# Patient Record
Sex: Female | Born: 1950 | Race: White | Hispanic: No | Marital: Single | Smoking: Former smoker
Health system: Southern US, Community
[De-identification: ages and names within clinical notes are randomized; demographics above are authoritative.]

## PROBLEM LIST (undated history)

## (undated) ENCOUNTER — Emergency Department (HOSPITAL_COMMUNITY): Admission: EM | Payer: Medicare Other

## (undated) DIAGNOSIS — Z923 Personal history of irradiation: Secondary | ICD-10-CM

## (undated) DIAGNOSIS — F419 Anxiety disorder, unspecified: Secondary | ICD-10-CM

## (undated) DIAGNOSIS — Z9221 Personal history of antineoplastic chemotherapy: Secondary | ICD-10-CM

## (undated) DIAGNOSIS — K219 Gastro-esophageal reflux disease without esophagitis: Secondary | ICD-10-CM

## (undated) DIAGNOSIS — M75100 Unspecified rotator cuff tear or rupture of unspecified shoulder, not specified as traumatic: Secondary | ICD-10-CM

## (undated) DIAGNOSIS — C50919 Malignant neoplasm of unspecified site of unspecified female breast: Secondary | ICD-10-CM

## (undated) DIAGNOSIS — H409 Unspecified glaucoma: Secondary | ICD-10-CM

## (undated) DIAGNOSIS — L309 Dermatitis, unspecified: Secondary | ICD-10-CM

## (undated) DIAGNOSIS — F329 Major depressive disorder, single episode, unspecified: Secondary | ICD-10-CM

## (undated) DIAGNOSIS — J309 Allergic rhinitis, unspecified: Secondary | ICD-10-CM

## (undated) DIAGNOSIS — N390 Urinary tract infection, site not specified: Secondary | ICD-10-CM

## (undated) DIAGNOSIS — D649 Anemia, unspecified: Secondary | ICD-10-CM

## (undated) DIAGNOSIS — G620 Drug-induced polyneuropathy: Secondary | ICD-10-CM

## (undated) DIAGNOSIS — F32A Depression, unspecified: Secondary | ICD-10-CM

## (undated) DIAGNOSIS — M1990 Unspecified osteoarthritis, unspecified site: Secondary | ICD-10-CM

## (undated) DIAGNOSIS — T451X5A Adverse effect of antineoplastic and immunosuppressive drugs, initial encounter: Secondary | ICD-10-CM

## (undated) DIAGNOSIS — R0600 Dyspnea, unspecified: Secondary | ICD-10-CM

## (undated) DIAGNOSIS — E039 Hypothyroidism, unspecified: Secondary | ICD-10-CM

## (undated) HISTORY — DX: Major depressive disorder, single episode, unspecified: F32.9

## (undated) HISTORY — PX: OTHER SURGICAL HISTORY: SHX169

## (undated) HISTORY — PX: APPENDECTOMY: SHX54

## (undated) HISTORY — PX: ADENOIDECTOMY: SUR15

## (undated) HISTORY — PX: POLYPECTOMY: SHX149

## (undated) HISTORY — DX: Urinary tract infection, site not specified: N39.0

## (undated) HISTORY — PX: BREAST LUMPECTOMY: SHX2

## (undated) HISTORY — DX: Gastro-esophageal reflux disease without esophagitis: K21.9

## (undated) HISTORY — PX: UPPER GASTROINTESTINAL ENDOSCOPY: SHX188

## (undated) HISTORY — PX: COLONOSCOPY: SHX174

## (undated) HISTORY — PX: TONSILLECTOMY: SUR1361

## (undated) HISTORY — DX: Allergic rhinitis, unspecified: J30.9

## (undated) HISTORY — DX: Unspecified glaucoma: H40.9

## (undated) HISTORY — PX: ROTATOR CUFF REPAIR: SHX139

## (undated) HISTORY — DX: Anxiety disorder, unspecified: F41.9

## (undated) HISTORY — PX: CHOLECYSTECTOMY: SHX55

## (undated) HISTORY — PX: WISDOM TOOTH EXTRACTION: SHX21

---

## 2011-05-21 DIAGNOSIS — J302 Other seasonal allergic rhinitis: Secondary | ICD-10-CM | POA: Insufficient documentation

## 2011-05-21 DIAGNOSIS — F419 Anxiety disorder, unspecified: Secondary | ICD-10-CM | POA: Insufficient documentation

## 2011-05-21 DIAGNOSIS — F411 Generalized anxiety disorder: Secondary | ICD-10-CM | POA: Insufficient documentation

## 2013-02-09 ENCOUNTER — Emergency Department (INDEPENDENT_AMBULATORY_CARE_PROVIDER_SITE_OTHER)
Admission: EM | Admit: 2013-02-09 | Discharge: 2013-02-09 | Disposition: A | Discharge status: Home / Self Care | Payer: BC Managed Care – PPO | Source: Home / Self Care

## 2013-02-09 ENCOUNTER — Encounter (HOSPITAL_COMMUNITY): Payer: Self-pay

## 2013-02-09 DIAGNOSIS — R310 Gross hematuria: Secondary | ICD-10-CM

## 2013-02-09 DIAGNOSIS — N390 Urinary tract infection, site not specified: Secondary | ICD-10-CM

## 2013-02-09 HISTORY — DX: Major depressive disorder, single episode, unspecified: F32.9

## 2013-02-09 HISTORY — DX: Depression, unspecified: F32.A

## 2013-02-09 LAB — POCT URINALYSIS DIP (DEVICE)
Protein, ur: >=300 mg/dL — AB
Urobilinogen, UA: 0.2 mg/dL (ref 0.0–1.0)

## 2013-02-09 MED ORDER — CIPROFLOXACIN HCL 500 MG PO TABS: 500.0000 mg | ORAL_TABLET | Freq: Two times a day (BID) | ORAL | Status: DC

## 2013-02-09 NOTE — ED Provider Notes (Addendum)
History     CSN: [id number]  Arrival date & time 02/09/13  1032   None     Chief Complaint  Patient presents with  . Urinary Tract Infection    (Consider location/radiation/quality/duration/timing/severity/associated sxs/prior treatment) HPI Comments: 62 year old female presents with fatigue and malaise for 2 days. This morning she awoke to urinate and had gross hematuria. For the past one to 2 days she has also experienced dysuria describes as a sharp discomfort upon urination. She has a history of previous "bladder problems" and received a bladder mesh. Since that time she has urinary urgency on a chronic basis. Denies flank pain or fever.   Past Medical History  Diagnosis Date  . Depressed     Past Surgical History  Procedure Laterality Date  . Cholecystectomy      History reviewed. No pertinent family history.  History  Substance Use Topics  . Smoking status: Never Smoker   . Smokeless tobacco: Not on file  . Alcohol Use: Yes    OB History   Grav Para Term Preterm Abortions TAB SAB Ect Mult Living                  Review of Systems  Constitutional: Positive for activity change and fatigue. Negative for fever.  HENT: Negative.   Respiratory: Negative.   Gastrointestinal: Negative.   Genitourinary: Negative for vaginal bleeding, vaginal discharge, vaginal pain and pelvic pain.       As per history of present illness  Hematological: Negative.   Psychiatric/Behavioral: Negative.     Allergies  Penicillins  Home Medications   Current Outpatient Rx  Name  Route  Sig  Dispense  Refill  . clonazePAM (KLONOPIN) 0.5 MG tablet   Oral   Take 0.5 mg by mouth 2 (two) times daily as needed for anxiety.         Marland Kitchen FLUoxetine (PROZAC) 10 MG capsule   Oral   Take 10 mg by mouth daily.         . nortriptyline (PAMELOR) 50 MG capsule   Oral   Take 50 mg by mouth at bedtime.         . ciprofloxacin (CIPRO) 500 MG tablet   Oral   Take 1 tablet (500 mg  total) by mouth every 12 (twelve) hours.   14 tablet   0     BP 119/70  Pulse 93  Temp(Src) 98.3 F (36.8 C) (Oral)  Resp 18  SpO2 98%  Physical Exam  Nursing note and vitals reviewed. Constitutional: She is oriented to person, place, and time. She appears well-developed and well-nourished. No distress.  Neck: Neck supple.  Cardiovascular: Normal rate.   Pulmonary/Chest: Effort normal. No respiratory distress.  Abdominal: Soft. There is no tenderness.  Musculoskeletal: Normal range of motion.  Neurological: She is alert and oriented to person, place, and time.  Skin: Skin is warm and dry.  Psychiatric: She has a normal mood and affect.    ED Course  Procedures (including critical care time)  Labs Reviewed  POCT URINALYSIS DIP (DEVICE) - Abnormal; Notable for the following:    Hgb urine dipstick LARGE (*)    Protein, ur >=300 (*)    Leukocytes, UA SMALL (*)    All other components within normal limits  URINE CULTURE   No results found. Results for orders placed during the hospital encounter of 02/09/13  POCT URINALYSIS DIP (DEVICE)      Result Value Range   Glucose, UA NEGATIVE  NEGATIVE mg/dL   Bilirubin Urine NEGATIVE  NEGATIVE   Ketones, ur NEGATIVE  NEGATIVE mg/dL   Specific Gravity, Urine 1.015  1.005 - 1.030   Hgb urine dipstick LARGE (*) NEGATIVE   pH 7.0  5.0 - 8.0   Protein, ur >=300 (*) NEGATIVE mg/dL   Urobilinogen, UA 0.2  0.0 - 1.0 mg/dL   Nitrite NEGATIVE  NEGATIVE   Leukocytes, UA SMALL (*) NEGATIVE     1. UTI (lower urinary tract infection)   2. Gross hematuria       MDM  Cipro Milligrams twice a day for 7 day AZO 3 times a day when necessary symptoms Drink plenty of fluids stay well hydrated If not improving in 2-3 days may return, otherwise followup with your primary care doctor in 3 weeks to have a repeat urinalysis to make sure the urine is free of blood.        Hayden Rasmussen, NP 02/09/13 1202  Hayden Rasmussen, NP 02/12/13 1441

## 2013-02-09 NOTE — ED Notes (Signed)
Reports blood in UA since last PM

## 2013-02-10 LAB — URINE CULTURE

## 2013-02-10 NOTE — ED Provider Notes (Signed)
Medical screening examination/treatment/procedure(s) were performed by resident physician or non-physician practitioner and as supervising physician I was immediately available for consultation/collaboration.   Berline Semrad DOUGLAS MD.   Deshone Lyssy D Melvern Ramone, MD 02/10/13 1104 

## 2013-02-13 NOTE — ED Provider Notes (Signed)
Medical screening examination/treatment/procedure(s) were performed by resident physician or non-physician practitioner and as supervising physician I was immediately available for consultation/collaboration.   KINDL,JAMES DOUGLAS MD.   James D Kindl, MD 02/13/13 1353 

## 2014-12-19 DIAGNOSIS — M79671 Pain in right foot: Secondary | ICD-10-CM

## 2014-12-19 HISTORY — DX: Pain in right foot: M79.671

## 2015-03-21 ENCOUNTER — Other Ambulatory Visit: Payer: Self-pay

## 2015-03-21 DIAGNOSIS — Z9289 Personal history of other medical treatment: Secondary | ICD-10-CM

## 2015-04-09 ENCOUNTER — Ambulatory Visit
Admission: RE | Admit: 2015-04-09 | Discharge: 2015-04-09 | Disposition: A | Discharge status: Home / Self Care | Payer: Medicare Other | Source: Ambulatory Visit

## 2015-04-09 DIAGNOSIS — Z9289 Personal history of other medical treatment: Secondary | ICD-10-CM

## 2015-04-14 ENCOUNTER — Other Ambulatory Visit: Payer: Self-pay | Admitting: Family Medicine

## 2015-04-14 DIAGNOSIS — R928 Other abnormal and inconclusive findings on diagnostic imaging of breast: Secondary | ICD-10-CM

## 2015-04-17 ENCOUNTER — Ambulatory Visit
Admission: RE | Admit: 2015-04-17 | Discharge: 2015-04-17 | Disposition: A | Discharge status: Home / Self Care | Payer: Medicare Other | Source: Ambulatory Visit | Attending: Family Medicine | Admitting: Family Medicine

## 2015-04-17 DIAGNOSIS — R928 Other abnormal and inconclusive findings on diagnostic imaging of breast: Secondary | ICD-10-CM

## 2015-09-09 ENCOUNTER — Other Ambulatory Visit: Payer: Self-pay | Admitting: Family Medicine

## 2015-09-09 DIAGNOSIS — N6320 Unspecified lump in the left breast, unspecified quadrant: Secondary | ICD-10-CM

## 2015-10-20 ENCOUNTER — Ambulatory Visit
Admission: RE | Admit: 2015-10-20 | Discharge: 2015-10-20 | Disposition: A | Discharge status: Home / Self Care | Payer: Medicare Other | Source: Ambulatory Visit | Attending: Family Medicine | Admitting: Family Medicine

## 2015-10-20 DIAGNOSIS — N6320 Unspecified lump in the left breast, unspecified quadrant: Secondary | ICD-10-CM

## 2016-03-10 ENCOUNTER — Telehealth: Payer: Self-pay | Admitting: Internal Medicine

## 2016-03-10 NOTE — Telephone Encounter (Signed)
Dr. Hilarie Fredrickson has accepted patient.

## 2016-03-22 ENCOUNTER — Encounter: Payer: Self-pay | Admitting: Internal Medicine

## 2016-03-23 ENCOUNTER — Encounter: Payer: Self-pay | Admitting: Internal Medicine

## 2016-03-24 ENCOUNTER — Other Ambulatory Visit: Payer: Self-pay | Admitting: Family Medicine

## 2016-03-24 DIAGNOSIS — N63 Unspecified lump in unspecified breast: Secondary | ICD-10-CM

## 2016-04-22 ENCOUNTER — Ambulatory Visit
Admission: RE | Admit: 2016-04-22 | Discharge: 2016-04-22 | Disposition: A | Discharge status: Home / Self Care | Payer: Medicare Other | Source: Ambulatory Visit | Attending: Family Medicine | Admitting: Family Medicine

## 2016-04-22 DIAGNOSIS — N63 Unspecified lump in unspecified breast: Secondary | ICD-10-CM

## 2016-05-05 ENCOUNTER — Ambulatory Visit (AMBULATORY_SURGERY_CENTER): Payer: Self-pay

## 2016-05-05 VITALS — Ht 65.0 in | Wt 149.0 lb

## 2016-05-05 DIAGNOSIS — Z1211 Encounter for screening for malignant neoplasm of colon: Secondary | ICD-10-CM

## 2016-05-05 MED ORDER — SUPREP BOWEL PREP KIT 17.5-3.13-1.6 GM/177ML PO SOLN: 1.0000 | Freq: Once | ORAL | 0 refills | Status: AC

## 2016-05-05 NOTE — Progress Notes (Signed)
No allergies to eggs or soy No past problems with anesthesia No home oxygen No diet meds  Has email and internet; registered for emmi 

## 2016-05-18 ENCOUNTER — Ambulatory Visit (AMBULATORY_SURGERY_CENTER): Payer: Medicare Other | Admitting: Internal Medicine

## 2016-05-18 ENCOUNTER — Encounter: Payer: Self-pay | Admitting: Internal Medicine

## 2016-05-18 VITALS — BP 140/83 | HR 81 | Temp 98.4°F | Resp 17 | Ht 65.0 in | Wt 149.0 lb

## 2016-05-18 DIAGNOSIS — Z1211 Encounter for screening for malignant neoplasm of colon: Secondary | ICD-10-CM | POA: Diagnosis not present

## 2016-05-18 DIAGNOSIS — D120 Benign neoplasm of cecum: Secondary | ICD-10-CM

## 2016-05-18 DIAGNOSIS — D122 Benign neoplasm of ascending colon: Secondary | ICD-10-CM | POA: Diagnosis not present

## 2016-05-18 DIAGNOSIS — D125 Benign neoplasm of sigmoid colon: Secondary | ICD-10-CM

## 2016-05-18 MED ORDER — SODIUM CHLORIDE 0.9 % IV SOLN: 500.0000 mL | INTRAVENOUS | Status: DC

## 2016-05-18 NOTE — Patient Instructions (Signed)
YOU HAD AN ENDOSCOPIC PROCEDURE TODAY AT [facility]:   Refer to the procedure report that was given to you for any specific questions about what was found during the examination.  If the procedure report does not answer your questions, please call your gastroenterologist to clarify.  If you requested that your care partner not be given the details of your procedure findings, then the procedure report has been included in a sealed envelope for you to review at your convenience later.  YOU SHOULD EXPECT: Some feelings of bloating in the abdomen. Passage of more gas than usual.  Walking can help get rid of the air that was put into your GI tract during the procedure and reduce the bloating. If you had a lower endoscopy (such as a colonoscopy or flexible sigmoidoscopy) you may notice spotting of blood in your stool or on the toilet paper. If you underwent a bowel prep for your procedure, you may not have a normal bowel movement for a few days.  Please Note:  You might notice some irritation and congestion in your nose or some drainage.  This is from the oxygen used during your procedure.  There is no need for concern and it should clear up in a day or so.  SYMPTOMS TO REPORT IMMEDIATELY:   Following lower endoscopy (colonoscopy or flexible sigmoidoscopy):  Excessive amounts of blood in the stool  Significant tenderness or worsening of abdominal pains  Swelling of the abdomen that is new, acute  Fever of 100F or higher   Following upper endoscopy (EGD)  Vomiting of blood or coffee ground material  New chest pain or pain under the shoulder blades  Painful or persistently difficult swallowing  New shortness of breath  Fever of 100F or higher  Black, tarry-looking stools  For urgent or emergent issues, a gastroenterologist can be reached at any hour by calling [phone].   DIET:  We do recommend a small meal at first, but then you may proceed to your regular diet.  Drink  plenty of fluids but you should avoid alcoholic beverages for 24 hours.  ACTIVITY:  You should plan to take it easy for the rest of today and you should NOT DRIVE or use heavy machinery until tomorrow (because of the sedation medicines used during the test).    FOLLOW UP: Our staff will call the number listed on your records the next business day following your procedure to check on you and address any questions or concerns that you may have regarding the information given to you following your procedure. If we do not reach you, we will leave a message.  However, if you are feeling well and you are not experiencing any problems, there is no need to return our call.  We will assume that you have returned to your regular daily activities without incident.  If any biopsies were taken you will be contacted by phone or by letter within the next 1-3 weeks.  Please call us at [phone] if you have not heard about the biopsies in 3 weeks.    SIGNATURES/CONFIDENTIALITY: You and/or your care partner have signed paperwork which will be entered into your electronic medical record.  These signatures attest to the fact that that the information above on your After Visit Summary has been reviewed and is understood.  Full responsibility of the confidentiality of this discharge information lies with you and/or your care-partner.    Handouts were given to your care partner on polyps  and hemorrhoids. No aspirin, aspirin products,  ibuprofen, naproxen, advil, motrin, aleve, or other non-steroidal anti-inflammatory drugs for 14 days after polyp removal. You may resume your other current medications today. Await biopsy results. Please call if any questions or concerns.

## 2016-05-18 NOTE — Progress Notes (Signed)
No problems noted in the recovery room. maw 

## 2016-05-18 NOTE — Progress Notes (Signed)
Called to room to assist during endoscopic procedure.  Patient ID and intended procedure confirmed with present staff. Received instructions for my participation in the procedure from the performing physician.  

## 2016-05-18 NOTE — Progress Notes (Signed)
To recovery, report to Willis, RN, VSS 

## 2016-05-18 NOTE — Op Note (Signed)
[facility] Patient Name: Judy Morrison Procedure Date: 05/18/2016 8:25 AM MRN: [id number] Endoscopist: Jerene Bears , MD Age: 65 Referring MD:  Date of Birth: 1950-09-22 Gender: Female Account #: [id number] Procedure:                Colonoscopy Indications:              Surveillance: Personal history of adenomatous                            polyps on last colonoscopy 5 years ago Medicines:                Monitored Anesthesia Care Procedure:                Pre-Anesthesia Assessment:                           - Prior to the procedure, a History and Physical                            was performed, and patient medications and                            allergies were reviewed. The patient's tolerance of                            previous anesthesia was also reviewed. The risks                            and benefits of the procedure and the sedation                            options and risks were discussed with the patient.                            All questions were answered, and informed consent                            was obtained. Prior Anticoagulants: The patient has                            taken no previous anticoagulant or antiplatelet                            agents. ASA Grade Assessment: II - A patient with                            mild systemic disease. After reviewing the risks                            and benefits, the patient was deemed in                            satisfactory condition to undergo the procedure.  After obtaining informed consent, the colonoscope                            was passed under direct vision. Throughout the                            procedure, the patient's blood pressure, pulse, and                            oxygen saturations were monitored continuously. The                            Model PCF-H190DL [id number]) scope was introduced                            through the anus and  advanced to the the cecum,                            identified by appendiceal orifice and ileocecal                            valve. The colonoscopy was performed without                            difficulty. The patient tolerated the procedure                            well. The quality of the bowel preparation was                            excellent. The ileocecal valve, appendiceal                            orifice, and rectum were photographed. Scope In: 8:43:26 AM Scope Out: 9:09:48 AM Scope Withdrawal Time: 0 hours 22 minutes 34 seconds  Total Procedure Duration: 0 hours 26 minutes 22 seconds  Findings:                 The digital rectal exam was normal.                           A 10 mm polyp was found in the cecum. The polyp was                            sessile. The polyp was removed with a cold snare.                            Resection and retrieval were complete.                           A 15 mm polyp spreading laterally along a fold                            (just distal to the ICV) was  found in the proximal                            ascending colon. The polyp was sessile. The polyp                            was removed with a hot snare with snare tip used to                            cauterize polypectomy edges. Resection and                            retrieval were complete.                           Two sessile polyps were found in the ascending                            colon. The polyps were 4 to 6 mm in size. These                            polyps were removed with a cold snare. Resection                            and retrieval were complete.                           A 5 mm polyp was found in the sigmoid colon. The                            polyp was sessile. The polyp was removed with a                            cold snare. Resection and retrieval were complete.                           Internal hemorrhoids were found during                             retroflexion. The hemorrhoids were small. Complications:            No immediate complications. Estimated Blood Loss:     Estimated blood loss was minimal. Impression:               - One 10 mm polyp in the cecum, removed with a cold                            snare. Resected and retrieved.                           - One 15 mm polyp in the proximal ascending colon,                            removed with a hot  snare. Resected and retrieved.                           - Two 4 to 6 mm polyps in the ascending colon,                            removed with a cold snare. Resected and retrieved.                           - One 5 mm polyp in the sigmoid colon, removed with                            a cold snare. Resected and retrieved.                           - Small internal hemorrhoids. Recommendation:           - Patient has a contact number available for                            emergencies. The signs and symptoms of potential                            delayed complications were discussed with the                            patient. Return to normal activities tomorrow.                            Written discharge instructions were provided to the                            patient.                           - Resume previous diet.                           - Continue present medications.                           - Avoid aspirin, ibuprofen, naproxen, or other                            non-steroidal anti-inflammatory drugs for 2 weeks                            after polyp removal.                           - Await pathology results.                           - Repeat colonoscopy is recommended for  surveillance of multiple polyps. The colonoscopy                            date will be determined after pathology results                            from today's exam become available for review. Jerene Bears, MD 05/18/2016 9:23:27 AM This report has been  signed electronically.

## 2016-05-19 ENCOUNTER — Telehealth: Payer: Self-pay

## 2016-05-19 NOTE — Telephone Encounter (Signed)
  Follow up Call-  Call back number 05/18/2016  Post procedure Call Back phone  # [phone]  Permission to leave phone message Yes  Some recent data might be hidden     Patient questions:  Do you have a fever, pain , or abdominal swelling? No. Pain Score  0 *  Have you tolerated food without any problems? Yes.    Have you been able to return to your normal activities? Yes.    Do you have any questions about your discharge instructions: Diet   Yes.   Medications  Yes.   Follow up visit  Yes.    Do you have questions or concerns about your Care? No.  Actions: * If pain score is 4 or above: No action needed, pain <4.

## 2016-05-20 ENCOUNTER — Encounter: Payer: Self-pay | Admitting: Internal Medicine

## 2016-05-31 ENCOUNTER — Ambulatory Visit: Payer: Medicare Other | Admitting: Internal Medicine

## 2016-06-25 DIAGNOSIS — E039 Hypothyroidism, unspecified: Secondary | ICD-10-CM | POA: Insufficient documentation

## 2016-12-30 DIAGNOSIS — H401190 Primary open-angle glaucoma, unspecified eye, stage unspecified: Secondary | ICD-10-CM | POA: Insufficient documentation

## 2016-12-30 DIAGNOSIS — D126 Benign neoplasm of colon, unspecified: Secondary | ICD-10-CM | POA: Insufficient documentation

## 2016-12-30 DIAGNOSIS — B0089 Other herpesviral infection: Secondary | ICD-10-CM | POA: Insufficient documentation

## 2016-12-30 DIAGNOSIS — M170 Bilateral primary osteoarthritis of knee: Secondary | ICD-10-CM

## 2016-12-30 HISTORY — DX: Benign neoplasm of colon, unspecified: D12.6

## 2016-12-30 HISTORY — DX: Primary open-angle glaucoma, unspecified eye, stage unspecified: H40.1190

## 2016-12-30 HISTORY — DX: Other herpesviral infection: B00.89

## 2016-12-30 HISTORY — DX: Bilateral primary osteoarthritis of knee: M17.0

## 2017-04-04 ENCOUNTER — Other Ambulatory Visit: Payer: Self-pay | Admitting: Family Medicine

## 2017-04-04 ENCOUNTER — Encounter: Payer: Self-pay | Admitting: Internal Medicine

## 2017-04-04 DIAGNOSIS — N6320 Unspecified lump in the left breast, unspecified quadrant: Secondary | ICD-10-CM

## 2017-04-25 ENCOUNTER — Ambulatory Visit
Admission: RE | Admit: 2017-04-25 | Discharge: 2017-04-25 | Disposition: A | Discharge status: Home / Self Care | Payer: Medicare Other | Source: Ambulatory Visit | Attending: Family Medicine | Admitting: Family Medicine

## 2017-04-25 DIAGNOSIS — N6320 Unspecified lump in the left breast, unspecified quadrant: Secondary | ICD-10-CM

## 2017-05-19 ENCOUNTER — Ambulatory Visit (AMBULATORY_SURGERY_CENTER): Payer: Self-pay | Admitting: *Deleted

## 2017-05-19 VITALS — Ht 65.0 in | Wt 156.0 lb

## 2017-05-19 DIAGNOSIS — N3941 Urge incontinence: Secondary | ICD-10-CM | POA: Insufficient documentation

## 2017-05-19 DIAGNOSIS — M75111 Incomplete rotator cuff tear or rupture of right shoulder, not specified as traumatic: Secondary | ICD-10-CM | POA: Insufficient documentation

## 2017-05-19 DIAGNOSIS — M8580 Other specified disorders of bone density and structure, unspecified site: Secondary | ICD-10-CM

## 2017-05-19 DIAGNOSIS — Z86010 Personal history of colonic polyps: Secondary | ICD-10-CM

## 2017-05-19 HISTORY — DX: Incomplete rotator cuff tear or rupture of right shoulder, not specified as traumatic: M75.111

## 2017-05-19 HISTORY — DX: Urge incontinence: N39.41

## 2017-05-19 HISTORY — DX: Other specified disorders of bone density and structure, unspecified site: M85.80

## 2017-05-19 MED ORDER — NA SULFATE-K SULFATE-MG SULF 17.5-3.13-1.6 GM/177ML PO SOLN: ORAL | 0 refills | Status: DC

## 2017-05-19 NOTE — Progress Notes (Signed)
Patient denies any allergies to eggs or soy. Patient denies any problems with anesthesia/sedation. Patient denies any oxygen use at home and does not take any diet/weight loss medications. EMMI education assisgned to patient on colonoscopy, this was explained and instructions given to patient. 

## 2017-06-06 ENCOUNTER — Ambulatory Visit (AMBULATORY_SURGERY_CENTER): Payer: Medicare Other | Admitting: Internal Medicine

## 2017-06-06 ENCOUNTER — Encounter: Payer: Self-pay | Admitting: Internal Medicine

## 2017-06-06 VITALS — BP 130/50 | HR 86 | Temp 98.7°F | Resp 21 | Ht 65.0 in | Wt 156.0 lb

## 2017-06-06 DIAGNOSIS — D122 Benign neoplasm of ascending colon: Secondary | ICD-10-CM | POA: Diagnosis not present

## 2017-06-06 DIAGNOSIS — Z86010 Personal history of colonic polyps: Secondary | ICD-10-CM

## 2017-06-06 HISTORY — PX: COLONOSCOPY: SHX174

## 2017-06-06 MED ORDER — SODIUM CHLORIDE 0.9 % IV SOLN: 500.0000 mL | INTRAVENOUS | Status: DC

## 2017-06-06 NOTE — Progress Notes (Signed)
Report to PACU, RN, vss, BBS= Clear.  

## 2017-06-06 NOTE — Op Note (Signed)
[facility] Patient Name: Judy Morrison Procedure Date: 06/06/2017 1:23 PM MRN: [id number] Endoscopist: Jerene Bears , MD Age: 66 Referring MD:  Date of Birth: 1951/09/10 Gender: Female Account #: [id number] Procedure:                Colonoscopy Indications:              Surveillance: Personal history of piecemeal removal                            of large sessile adenoma distal to IC valve on last                            colonoscopy (1 year ago) Medicines:                Monitored Anesthesia Care Procedure:                Pre-Anesthesia Assessment:                           - Prior to the procedure, a History and Physical                            was performed, and patient medications and                            allergies were reviewed. The patient's tolerance of                            previous anesthesia was also reviewed. The risks                            and benefits of the procedure and the sedation                            options and risks were discussed with the patient.                            All questions were answered, and informed consent                            was obtained. Prior Anticoagulants: The patient has                            taken no previous anticoagulant or antiplatelet                            agents. ASA Grade Assessment: II - A patient with                            mild systemic disease. After reviewing the risks                            and benefits, the patient was deemed in  satisfactory condition to undergo the procedure.                           After obtaining informed consent, the colonoscope                            was passed under direct vision. Throughout the                            procedure, the patient's blood pressure, pulse, and                            oxygen saturations were monitored continuously. The                            Model PCF-H190DL [id number])  scope was introduced                            through the anus and advanced to the the cecum,                            identified by appendiceal orifice and ileocecal                            valve. The colonoscopy was performed without                            difficulty. The patient tolerated the procedure                            well. The quality of the bowel preparation was good. Scope In: 1:42:34 PM Scope Out: 2:01:03 PM Scope Withdrawal Time: 0 hours 12 minutes 6 seconds  Total Procedure Duration: 0 hours 18 minutes 29 seconds  Findings:                 The digital rectal exam was normal.                           A 4 mm polyp was found in the ascending colon. The                            polyp was sessile. The polyp was removed with a                            cold snare. Resection and retrieval were complete.                           Multiple small-mouthed diverticula were found in                            the sigmoid colon.                           Internal hemorrhoids were found during  retroflexion. The hemorrhoids were small.                           There is no endoscopic evidence of residual polyp                            in the ascending colon distal to the IC valve at                            site of previous polypectomy. Complications:            No immediate complications. Estimated Blood Loss:     Estimated blood loss was minimal. Impression:               - One 4 mm polyp in the ascending colon, removed                            with a cold snare. Resected and retrieved.                           - Mild diverticulosis in the sigmoid colon.                           - Small internal hemorrhoids. Recommendation:           - Patient has a contact number available for                            emergencies. The signs and symptoms of potential                            delayed complications were discussed with the                             patient. Return to normal activities tomorrow.                            Written discharge instructions were provided to the                            patient.                           - Resume previous diet.                           - Continue present medications.                           - Await pathology results.                           - Repeat colonoscopy is recommended for                            surveillance, likely in 3 years. The colonoscopy  date will be determined after pathology results                            from today's exam become available for review. Jerene Bears, MD 06/06/2017 2:11:08 PM This report has been signed electronically.

## 2017-06-06 NOTE — Progress Notes (Signed)
Called to room to assist during endoscopic procedure.  Patient ID and intended procedure confirmed with present staff. Received instructions for my participation in the procedure from the performing physician.  

## 2017-06-06 NOTE — Progress Notes (Signed)
Pt was dx with a UTI 05-26-17.  She took cipro BID for 1 week.  Completed 06-02-17.  Pt denies any sx today. Maw  Pt reported her father is deseased.  He died with complications from "part of his intestants died".  She is not sure if it was his large or small bowel.  maw

## 2017-06-06 NOTE — Patient Instructions (Signed)
**  Handouts given on polyps, diverticulosis, high fiber diet, and hemorrhoids**  YOU HAD AN ENDOSCOPIC PROCEDURE TODAY AT [facility]:   Refer to the procedure report that was given to you for any specific questions about what was found during the examination.  If the procedure report does not answer your questions, please call your gastroenterologist to clarify.  If you requested that your care partner not be given the details of your procedure findings, then the procedure report has been included in a sealed envelope for you to review at your convenience later.  YOU SHOULD EXPECT: Some feelings of bloating in the abdomen. Passage of more gas than usual.  Walking can help get rid of the air that was put into your GI tract during the procedure and reduce the bloating. If you had a lower endoscopy (such as a colonoscopy or flexible sigmoidoscopy) you may notice spotting of blood in your stool or on the toilet paper. If you underwent a bowel prep for your procedure, you may not have a normal bowel movement for a few days.  Please Note:  You might notice some irritation and congestion in your nose or some drainage.  This is from the oxygen used during your procedure.  There is no need for concern and it should clear up in a day or so.  SYMPTOMS TO REPORT IMMEDIATELY:   Following lower endoscopy (colonoscopy or flexible sigmoidoscopy):  Excessive amounts of blood in the stool  Significant tenderness or worsening of abdominal pains  Swelling of the abdomen that is new, acute  Fever of 100F or higher  For urgent or emergent issues, a gastroenterologist can be reached at any hour by calling [phone].   DIET:  We do recommend a small meal at first, but then you may proceed to your regular diet.  Drink plenty of fluids but you should avoid alcoholic beverages for 24 hours.  ACTIVITY:  You should plan to take it easy for the rest of today and you should NOT DRIVE or use heavy  machinery until tomorrow (because of the sedation medicines used during the test).    FOLLOW UP: Our staff will call the number listed on your records the next business day following your procedure to check on you and address any questions or concerns that you may have regarding the information given to you following your procedure. If we do not reach you, we will leave a message.  However, if you are feeling well and you are not experiencing any problems, there is no need to return our call.  We will assume that you have returned to your regular daily activities without incident.  If any biopsies were taken you will be contacted by phone or by letter within the next 1-3 weeks.  Please call us at [phone] if you have not heard about the biopsies in 3 weeks.    SIGNATURES/CONFIDENTIALITY: You and/or your care partner have signed paperwork which will be entered into your electronic medical record.  These signatures attest to the fact that that the information above on your After Visit Summary has been reviewed and is understood.  Full responsibility of the confidentiality of this discharge information lies with you and/or your care-partner.

## 2017-06-07 ENCOUNTER — Telehealth: Payer: Self-pay

## 2017-06-07 NOTE — Telephone Encounter (Signed)
  Follow up Call-  Call back number 06/06/2017 05/18/2016  Post procedure Call Back phone  # #[phone] cell [phone]  Permission to leave phone message Yes Yes  Some recent data might be hidden     Patient questions:  Do you have a fever, pain , or abdominal swelling? No. Pain Score  0 *  Have you tolerated food without any problems? Yes.    Have you been able to return to your normal activities? Yes.    Do you have any questions about your discharge instructions: Diet   No. Medications  No. Follow up visit  No.  Do you have questions or concerns about your Care? No.  Actions: * If pain score is 4 or above: No action needed, pain <4.  No problems noted per pt. maw

## 2017-06-09 ENCOUNTER — Encounter: Payer: Self-pay | Admitting: Internal Medicine

## 2017-06-18 ENCOUNTER — Ambulatory Visit: Payer: Self-pay | Admitting: Orthopedic Surgery

## 2017-07-20 NOTE — H&P (Signed)
TOTAL KNEE ADMISSION H&P  Patient is being admitted for right total knee arthroplasty.  Subjective:  Chief Complaint:right knee pain.  HPI: Ebony Hail, 66 y.o. female, has a history of pain and functional disability in the right knee due to arthritis and has failed non-surgical conservative treatments for greater than 12 weeks to includecorticosteriod injections, viscosupplementation injections and weight reduction as appropriate.  Onset of symptoms was gradual, starting 3 years ago with gradually worsening course since that time. The patient noted prior procedures on the knee to include  arthroscopy on the right knee(s).  Patient currently rates pain in the right knee(s) at 8 out of 10 with activity. Patient has worsening of pain with activity and weight bearing, pain that interferes with activities of daily living, pain with passive range of motion and joint swelling.  Patient has evidence of subchondral sclerosis, periarticular osteophytes and joint space narrowing by imaging studies. There is no active infection.  Patient Active Problem List   Diagnosis Date Noted  . Incomplete tear of right rotator cuff 05/19/2017  . Osteopenia 05/19/2017  . Urge incontinence of urine 05/19/2017  . Primary open angle glaucoma (POAG) 12/30/2016  . Primary osteoarthritis of both knees 12/30/2016  . Recurrent oral herpes simplex infection 12/30/2016  . Tubular adenoma of colon 12/30/2016  . Hypothyroidism 06/25/2016  . Right foot pain 12/19/2014  . Anxiety 05/21/2011  . Seasonal allergies 05/21/2011   Past Medical History:  Diagnosis Date  . Depressed   . GERD (gastroesophageal reflux disease)    controlled with Omeprazole  . Glaucoma   . Urinary tract infection    dx 05-26-17 took 1 week cipro BID completed 06-02-17.    Past Surgical History:  Procedure Laterality Date  . APPENDECTOMY    . arthroscopic right knee     . CHOLECYSTECTOMY    . COLONOSCOPY    . Pelvic sling     x2  .  POLYPECTOMY    . TONSILLECTOMY    . UPPER GASTROINTESTINAL ENDOSCOPY    . WISDOM TOOTH EXTRACTION      No current facility-administered medications for this encounter.    Current Outpatient Medications  Medication Sig Dispense Refill Last Dose  . ALPRAZolam (XANAX) 0.25 MG tablet Take 1 tablet by mouth as needed.   Past Week  . Brexpiprazole (REXULTI) 1 MG TABS Take 1 tablet by mouth daily.   06/06/2017  . clonazePAM (KLONOPIN) 0.5 MG tablet Take 0.5 mg by mouth 2 (two) times daily as needed for anxiety.   06/05/2017  . ergocalciferol (VITAMIN D2) 50000 units capsule Take by mouth.   06/05/2017  . ibuprofen (ADVIL,MOTRIN) 200 MG tablet Take 400 mg by mouth every 8 (eight) hours as needed.   06/05/2017  . latanoprost (XALATAN) 0.005 % ophthalmic solution Place 1 drop into both eyes at bedtime.   06/05/2017  . levothyroxine (SYNTHROID, LEVOTHROID) 50 MCG tablet Take 50 mcg by mouth daily before breakfast.   06/05/2017  . nortriptyline (PAMELOR) 50 MG capsule Take by mouth.   06/05/2017  . omeprazole (PRILOSEC) 40 MG capsule TAKE ONE CAPSULE BY MOUTH EVERY DAY   06/06/2017   Allergies  Allergen Reactions  . Codeine Nausea Only  . Erythromycin Itching  . Penicillins Itching and Swelling    Social History   Tobacco Use  . Smoking status: Former Smoker    Types: Cigarettes  . Smokeless tobacco: Never Used  Substance Use Topics  . Alcohol use: Yes    Comment: 3-4 beers monthly  Family History  Problem Relation Age of Onset  . Colon cancer Neg Hx   . Breast cancer Neg Hx   . Esophageal cancer Neg Hx   . Pancreatic cancer Neg Hx   . Prostate cancer Neg Hx   . Rectal cancer Neg Hx   . Stomach cancer Neg Hx      Review of Systems  Constitutional: Negative.   HENT: Negative.   Eyes: Negative.   Respiratory: Negative.   Cardiovascular: Negative.   Gastrointestinal: Negative.   Genitourinary: Negative.   Musculoskeletal: Positive for joint pain.  Skin: Negative.   Neurological:  Negative.   Endo/Heme/Allergies: Negative.   Psychiatric/Behavioral: Negative.     Objective:  Physical Exam  Constitutional: She is oriented to person, place, and time. She appears well-developed.  HENT:  Head: Normocephalic.  Eyes: EOM are normal.  Neck: Normal range of motion.  Cardiovascular: Normal rate and intact distal pulses.  Respiratory: Effort normal.  GI: Soft.  Genitourinary:  Genitourinary Comments: Deferred  Musculoskeletal:  ROM is good. Knee is stable at varus and valgus stress. RLE grossly n/v intact.  Neurological: She is alert and oriented to person, place, and time.  Skin: Skin is warm and dry.  Psychiatric: Her behavior is normal.    Vital signs in last 24 hours: BP: ()/()  Arterial Line BP: ()/()   Labs:   Estimated body mass index is 25.96 kg/m as calculated from the following:   Height as of 06/06/17: 5\' 5"  (1.651 m).   Weight as of 06/06/17: 70.8 kg (156 lb).   Imaging Review Plain radiographs demonstrate moderate degenerative joint disease of the right knee(s). The overall alignment ismild varus. The bone quality appears to be good for age and reported activity level.  Assessment/Plan:  End stage arthritis, right knee   The patient history, physical examination, clinical judgment of the provider and imaging studies are consistent with end stage degenerative joint disease of the right knee(s) and total knee arthroplasty is deemed medically necessary. The treatment options including medical management, injection therapy arthroscopy and arthroplasty were discussed at length. The risks and benefits of total knee arthroplasty were presented and reviewed. The risks due to aseptic loosening, infection, stiffness, patella tracking problems, thromboembolic complications and other imponderables were discussed. The patient acknowledged the explanation, agreed to proceed with the plan and consent was signed. Patient is being admitted for inpatient treatment  for surgery, pain control, PT, OT, prophylactic antibiotics, VTE prophylaxis, progressive ambulation and ADL's and discharge planning. The patient is planning to be discharged home with home health services   Will use IV tranexamic acid. Contraindications and adverse affects of Tranexamic acid discussed in detail. Patient denies any of these at this time and understands the risks and benefits.

## 2017-08-03 NOTE — Progress Notes (Signed)
05-26-17 Surgical clearance and EKG on chart.

## 2017-08-03 NOTE — Patient Instructions (Addendum)
PEACE NOYES  08/03/2017   Your procedure is scheduled on: 08-12-17   Report to [place]  Entrance Take [place] Elevators to 3rd floor to  [place] at 1:15 PM.   Call this number if you have problems the morning of surgery [phone]    Remember: ONLY 1 PERSON MAY GO WITH YOU TO SHORT STAY TO GET  READY MORNING OF [place].  Do not eat food or drink liquids :After Midnight.You may have a Clear Liquid Diet from Midnight until 9:45 AM. After 9:45 AM, nothing until after surgery.    CLEAR LIQUID DIET   Foods Allowed                                                                     Foods Excluded  Coffee and tea, regular and decaf                             liquids that you cannot  Plain Jell-O in any flavor                                             see through such as: Fruit ices (not with fruit pulp)                                     milk, soups, orange juice  Iced Popsicles                                    All solid food Carbonated beverages, regular and diet                                    Cranberry, grape and apple juices Sports drinks like Gatorade Lightly seasoned clear broth or consume(fat free) Sugar, honey syrup  Sample Menu Breakfast                                Lunch                                     Supper Cranberry juice                    Beef broth                            Chicken broth Jell-O                                     Grape juice  Apple juice Coffee or tea                        Jell-O                                      Popsicle                                                Coffee or tea                        Coffee or tea  _____________________________________________________________________     Take these medicines the morning of surgery with A SIP OF WATER: Brexiprazole (Rexulti), Clonazepram (Klonopin), and Omeprazole (Prilosec). You may also bring and  use your eyedrops as needed.                                 You may not have any metal on your body including hair pins and              piercings  Do not wear jewelry, make-up, lotions, powders or perfumes, deodorant             Do not wear nail polish.  Do not shave  48 hours prior to surgery.               Do not bring valuables to the hospital. [place].  Contacts, dentures or bridgework may not be worn into surgery.  Leave suitcase in the car. After surgery it may be brought to your room.                 Please read over the following fact sheets you were given: _____________________________________________________________________             [facility] - Preparing for Surgery Before surgery, you can play an important role.  Because skin is not sterile, your skin needs to be as free of germs as possible.  You can reduce the number of germs on your skin by washing with CHG (chlorahexidine gluconate) soap before surgery.  CHG is an antiseptic cleaner which kills germs and bonds with the skin to continue killing germs even after washing. Please DO NOT use if you have an allergy to CHG or antibacterial soaps.  If your skin becomes reddened/irritated stop using the CHG and inform your nurse when you arrive at Short Stay. Do not shave (including legs and underarms) for at least 48 hours prior to the first CHG shower.  You may shave your face/neck. Please follow these instructions carefully:  1.  Shower with CHG Soap the night before surgery and the  morning of Surgery.  2.  If you choose to wash your hair, wash your hair first as usual with your  normal  shampoo.  3.  After you shampoo, rinse your hair and body thoroughly to remove the  shampoo.  4.  Use CHG as you would any other liquid soap.  You can apply chg directly  to the skin and wash                       Gently with a scrungie or clean washcloth.  5.   Apply the CHG Soap to your body ONLY FROM THE NECK DOWN.   Do not use on face/ open                           Wound or open sores. Avoid contact with eyes, ears mouth and genitals (private parts).                       Wash face,  Genitals (private parts) with your normal soap.             6.  Wash thoroughly, paying special attention to the area where your surgery  will be performed.  7.  Thoroughly rinse your body with warm water from the neck down.  8.  DO NOT shower/wash with your normal soap after using and rinsing off  the CHG Soap.                9.  Pat yourself dry with a clean towel.            10.  Wear clean pajamas.            11.  Place clean sheets on your bed the night of your first shower and do not  sleep with pets. Day of Surgery : Do not apply any lotions/deodorants the morning of surgery.  Please wear clean clothes to the hospital/surgery center.  FAILURE TO FOLLOW THESE INSTRUCTIONS MAY RESULT IN THE CANCELLATION OF YOUR SURGERY PATIENT SIGNATURE_________________________________  NURSE SIGNATURE__________________________________  ________________________________________________________________________   Adam Phenix  An incentive spirometer is a tool that can help keep your lungs clear and active. This tool measures how well you are filling your lungs with each breath. Taking long deep breaths may help reverse or decrease the chance of developing breathing (pulmonary) problems (especially infection) following:  A long period of time when you are unable to move or be active. BEFORE THE PROCEDURE   If the spirometer includes an indicator to show your best effort, your nurse or respiratory therapist will set it to a desired goal.  If possible, sit up straight or lean slightly forward. Try not to slouch.  Hold the incentive spirometer in an upright position. INSTRUCTIONS FOR USE  1. Sit on the edge of your bed if possible, or sit up as far as you can in bed  or on a chair. 2. Hold the incentive spirometer in an upright position. 3. Breathe out normally. 4. Place the mouthpiece in your mouth and seal your lips tightly around it. 5. Breathe in slowly and as deeply as possible, raising the piston or the ball toward the top of the column. 6. Hold your breath for 3-5 seconds or for as long as possible. Allow the piston or ball to fall to the bottom of the column. 7. Remove the mouthpiece from your mouth and breathe out normally. 8. Rest for a few seconds and repeat Steps 1 through 7 at least 10 times every 1-2 hours when you are awake. Take your time and take a few normal breaths between deep breaths. 9. The spirometer may include an indicator to  show your best effort. Use the indicator as a goal to work toward during each repetition. 10. After each set of 10 deep breaths, practice coughing to be sure your lungs are clear. If you have an incision (the cut made at the time of surgery), support your incision when coughing by placing a pillow or rolled up towels firmly against it. Once you are able to get out of bed, walk around indoors and cough well. You may stop using the incentive spirometer when instructed by your caregiver.  RISKS AND COMPLICATIONS  Take your time so you do not get dizzy or light-headed.  If you are in pain, you may need to take or ask for pain medication before doing incentive spirometry. It is harder to take a deep breath if you are having pain. AFTER USE  Rest and breathe slowly and easily.  It can be helpful to keep track of a log of your progress. Your caregiver can provide you with a simple table to help with this. If you are using the spirometer at home, follow these instructions: [place] IF:   You are having difficultly using the spirometer.  You have trouble using the spirometer as often as instructed.  Your pain medication is not giving enough relief while using the spirometer.  You develop fever of 100.5  F (38.1 C) or higher. SEEK IMMEDIATE MEDICAL CARE IF:   You cough up bloody sputum that had not been present before.  You develop fever of 102 F (38.9 C) or greater.  You develop worsening pain at or near the incision site. MAKE SURE YOU:   Understand these instructions.  Will watch your condition.  Will get help right away if you are not doing well or get worse. Document Released: 01/10/2007 Document Revised: 11/22/2011 Document Reviewed: 03/13/2007 Exit[employer] Patient Information 2014 [employer], [place].   ________________________________________________________________________  WHAT IS A BLOOD TRANSFUSION? Blood Transfusion Information  A transfusion is the replacement of blood or some of its parts. Blood is made up of multiple cells which provide different functions.  Red blood cells carry oxygen and are used for blood loss replacement.  White blood cells fight against infection.  Platelets control bleeding.  Plasma helps clot blood.  Other blood products are available for specialized needs, such as hemophilia or other clotting disorders. BEFORE THE TRANSFUSION  Who gives blood for transfusions?   Healthy volunteers who are fully evaluated to make sure their blood is safe. This is blood bank blood. Transfusion therapy is the safest it has ever been in the practice of medicine. Before blood is taken from a donor, a complete history is taken to make sure that person has no history of diseases nor engages in risky social behavior (examples are intravenous drug use or sexual activity with multiple partners). The donor's travel history is screened to minimize risk of transmitting infections, such as malaria. The donated blood is tested for signs of infectious diseases, such as HIV and hepatitis. The blood is then tested to be sure it is compatible with you in order to minimize the chance of a transfusion reaction. If you or a relative donates blood, this is often done in  anticipation of surgery and is not appropriate for emergency situations. It takes many days to process the donated blood. RISKS AND COMPLICATIONS Although transfusion therapy is very safe and saves many lives, the main dangers of transfusion include:   Getting an infectious disease.  Developing a transfusion reaction. This is an allergic reaction  to something in the blood you were given. Every precaution is taken to prevent this. The decision to have a blood transfusion has been considered carefully by your caregiver before blood is given. Blood is not given unless the benefits outweigh the risks. AFTER THE TRANSFUSION  Right after receiving a blood transfusion, you will usually feel much better and more energetic. This is especially true if your red blood cells have gotten low (anemic). The transfusion raises the level of the red blood cells which carry oxygen, and this usually causes an energy increase.  The nurse administering the transfusion will monitor you carefully for complications. HOME CARE INSTRUCTIONS  No special instructions are needed after a transfusion. You may find your energy is better. Speak with your caregiver about any limitations on activity for underlying diseases you may have. SEEK MEDICAL CARE IF:   Your condition is not improving after your transfusion.  You develop redness or irritation at the intravenous (IV) site. SEEK IMMEDIATE MEDICAL CARE IF:  Any of the following symptoms occur over the next 12 hours:  Shaking chills.  You have a temperature by mouth above 102 F (38.9 C), not controlled by medicine.  Chest, back, or muscle pain.  People around you feel you are not acting correctly or are confused.  Shortness of breath or difficulty breathing.  Dizziness and fainting.  You get a rash or develop hives.  You have a decrease in urine output.  Your urine turns a dark color or changes to pink, red, or brown. Any of the following symptoms occur over  the next 10 days:  You have a temperature by mouth above 102 F (38.9 C), not controlled by medicine.  Shortness of breath.  Weakness after normal activity.  The white part of the eye turns yellow (jaundice).  You have a decrease in the amount of urine or are urinating less often.  Your urine turns a dark color or changes to pink, red, or brown. Document Released: 08/27/2000 Document Revised: 11/22/2011 Document Reviewed: 04/15/2008 [facility] Patient Information 2014 [place], [place].  _______________________________________________________________________

## 2017-08-09 ENCOUNTER — Encounter (HOSPITAL_COMMUNITY): Payer: Self-pay

## 2017-08-09 ENCOUNTER — Encounter (HOSPITAL_COMMUNITY)
Admission: RE | Admit: 2017-08-09 | Discharge: 2017-08-09 | Disposition: A | Discharge status: Home / Self Care | Payer: Medicare Other | Source: Ambulatory Visit | Attending: Specialist | Admitting: Specialist

## 2017-08-09 ENCOUNTER — Other Ambulatory Visit: Payer: Self-pay

## 2017-08-09 DIAGNOSIS — Z79890 Hormone replacement therapy (postmenopausal): Secondary | ICD-10-CM | POA: Insufficient documentation

## 2017-08-09 DIAGNOSIS — M1711 Unilateral primary osteoarthritis, right knee: Secondary | ICD-10-CM | POA: Insufficient documentation

## 2017-08-09 DIAGNOSIS — E039 Hypothyroidism, unspecified: Secondary | ICD-10-CM

## 2017-08-09 DIAGNOSIS — F419 Anxiety disorder, unspecified: Secondary | ICD-10-CM | POA: Insufficient documentation

## 2017-08-09 DIAGNOSIS — Z01818 Encounter for other preprocedural examination: Secondary | ICD-10-CM | POA: Insufficient documentation

## 2017-08-09 DIAGNOSIS — Z79899 Other long term (current) drug therapy: Secondary | ICD-10-CM

## 2017-08-09 LAB — URINALYSIS, ROUTINE W REFLEX MICROSCOPIC
BACTERIA UA: NONE SEEN
BILIRUBIN URINE: NEGATIVE
Glucose, UA: NEGATIVE mg/dL
KETONES UR: NEGATIVE mg/dL
LEUKOCYTES UA: NEGATIVE
NITRITE: NEGATIVE
Protein, ur: NEGATIVE mg/dL
Specific Gravity, Urine: 1.003 — ABNORMAL LOW (ref 1.005–1.030)
Squamous Epithelial / LPF: NONE SEEN
pH: 5 (ref 5.0–8.0)

## 2017-08-09 LAB — CBC
HCT: 37.5 % (ref 36.0–46.0)
Hemoglobin: 12.3 g/dL (ref 12.0–15.0)
MCH: 27.2 pg (ref 26.0–34.0)
MCHC: 32.8 g/dL (ref 30.0–36.0)
MCV: 82.8 fL (ref 78.0–100.0)
PLATELETS: 357 10*3/uL (ref 150–400)
RBC: 4.53 MIL/uL (ref 3.87–5.11)
RDW: 14.4 % (ref 11.5–15.5)
WBC: 8.6 10*3/uL (ref 4.0–10.5)

## 2017-08-09 LAB — SURGICAL PCR SCREEN
MRSA, PCR: NEGATIVE
STAPHYLOCOCCUS AUREUS: NEGATIVE

## 2017-08-09 LAB — ABO/RH: ABO/RH(D): A POS

## 2017-08-09 LAB — BASIC METABOLIC PANEL
Anion gap: 6 (ref 5–15)
BUN: 19 mg/dL (ref 6–20)
CALCIUM: 9.4 mg/dL (ref 8.9–10.3)
CO2: 28 mmol/L (ref 22–32)
CREATININE: 0.9 mg/dL (ref 0.44–1.00)
Chloride: 104 mmol/L (ref 101–111)
GFR calc non Af Amer: >60 mL/min (ref >60)
Glucose, Bld: 95 mg/dL (ref 65–99)
Potassium: 5.1 mmol/L (ref 3.5–5.1)
Sodium: 138 mmol/L (ref 135–145)

## 2017-08-09 LAB — PROTIME-INR
INR: 1.03
PROTHROMBIN TIME: 13.4 s (ref 11.4–15.2)

## 2017-08-09 LAB — APTT: aPTT: 30 seconds (ref 24–36)

## 2017-08-12 ENCOUNTER — Encounter (HOSPITAL_COMMUNITY)
Admission: RE | Disposition: A | Discharge status: Skilled Nursing Facility | Payer: Self-pay | Source: Ambulatory Visit | Attending: Specialist

## 2017-08-12 ENCOUNTER — Inpatient Hospital Stay (HOSPITAL_COMMUNITY): Payer: Medicare Other | Admitting: Certified Registered"

## 2017-08-12 ENCOUNTER — Inpatient Hospital Stay (HOSPITAL_COMMUNITY)
Admission: RE | Admit: 2017-08-12 | Discharge: 2017-08-15 | DRG: 470 | Disposition: A | Discharge status: Skilled Nursing Facility | Payer: Medicare Other | Source: Ambulatory Visit | Attending: Specialist | Admitting: Specialist

## 2017-08-12 ENCOUNTER — Other Ambulatory Visit: Payer: Self-pay

## 2017-08-12 ENCOUNTER — Encounter (HOSPITAL_COMMUNITY): Payer: Self-pay | Admitting: *Deleted

## 2017-08-12 DIAGNOSIS — Z87891 Personal history of nicotine dependence: Secondary | ICD-10-CM

## 2017-08-12 DIAGNOSIS — K219 Gastro-esophageal reflux disease without esophagitis: Secondary | ICD-10-CM | POA: Diagnosis present

## 2017-08-12 DIAGNOSIS — E039 Hypothyroidism, unspecified: Secondary | ICD-10-CM | POA: Diagnosis present

## 2017-08-12 DIAGNOSIS — H401190 Primary open-angle glaucoma, unspecified eye, stage unspecified: Secondary | ICD-10-CM | POA: Diagnosis present

## 2017-08-12 DIAGNOSIS — F329 Major depressive disorder, single episode, unspecified: Secondary | ICD-10-CM | POA: Diagnosis present

## 2017-08-12 DIAGNOSIS — F419 Anxiety disorder, unspecified: Secondary | ICD-10-CM | POA: Diagnosis present

## 2017-08-12 DIAGNOSIS — M8580 Other specified disorders of bone density and structure, unspecified site: Secondary | ICD-10-CM | POA: Diagnosis present

## 2017-08-12 DIAGNOSIS — Z96659 Presence of unspecified artificial knee joint: Secondary | ICD-10-CM

## 2017-08-12 DIAGNOSIS — M1711 Unilateral primary osteoarthritis, right knee: Principal | ICD-10-CM | POA: Diagnosis present

## 2017-08-12 HISTORY — PX: TOTAL KNEE ARTHROPLASTY: SHX125

## 2017-08-12 HISTORY — DX: Presence of unspecified artificial knee joint: Z96.659

## 2017-08-12 LAB — TYPE AND SCREEN
ABO/RH(D): A POS
ANTIBODY SCREEN: NEGATIVE

## 2017-08-12 SURGERY — ARTHROPLASTY, KNEE, TOTAL
Anesthesia: Monitor Anesthesia Care | Site: Knee | Laterality: Right

## 2017-08-12 MED ORDER — PHENYLEPHRINE HCL 10 MG/ML IJ SOLN
INTRAMUSCULAR | Status: DC | PRN
  Administered 2017-08-12: 50 ug/min via INTRAVENOUS

## 2017-08-12 MED ORDER — SODIUM CHLORIDE 0.9 % IJ SOLN
INTRAMUSCULAR | Status: AC
  Filled 2017-08-12: qty 50

## 2017-08-12 MED ORDER — DEXAMETHASONE SODIUM PHOSPHATE 10 MG/ML IJ SOLN
10.0000 mg | Freq: Once | INTRAMUSCULAR | Status: AC
  Administered 2017-08-13: 11:00:00 10 mg via INTRAVENOUS
  Filled 2017-08-12: qty 1

## 2017-08-12 MED ORDER — OXYCODONE HCL 5 MG PO TABS
5.0000 mg | ORAL_TABLET | ORAL | Status: DC | PRN
  Administered 2017-08-13 – 2017-08-15 (×6): 5 mg via ORAL
  Filled 2017-08-12 (×6): qty 1

## 2017-08-12 MED ORDER — SODIUM CHLORIDE 0.9 % IV SOLN
INTRAVENOUS | Status: DC
  Administered 2017-08-13: 04:00:00 via INTRAVENOUS

## 2017-08-12 MED ORDER — DOCUSATE SODIUM 100 MG PO CAPS
100.0000 mg | ORAL_CAPSULE | Freq: Two times a day (BID) | ORAL | Status: DC
  Administered 2017-08-12 – 2017-08-15 (×6): 100 mg via ORAL
  Filled 2017-08-12 (×6): qty 1

## 2017-08-12 MED ORDER — FENTANYL CITRATE (PF) 100 MCG/2ML IJ SOLN
INTRAMUSCULAR | Status: AC
  Filled 2017-08-12: qty 2

## 2017-08-12 MED ORDER — HYDROMORPHONE HCL 2 MG PO TABS: 2.0000 mg | ORAL_TABLET | ORAL | 0 refills | Status: DC | PRN

## 2017-08-12 MED ORDER — OXYCODONE HCL 5 MG PO TABS: 5.0000 mg | ORAL_TABLET | Freq: Once | ORAL | Status: DC | PRN

## 2017-08-12 MED ORDER — FENTANYL CITRATE (PF) 100 MCG/2ML IJ SOLN: 100.0000 ug | Freq: Once | INTRAMUSCULAR | Status: DC

## 2017-08-12 MED ORDER — PHENYLEPHRINE HCL 10 MG/ML IJ SOLN
INTRAMUSCULAR | Status: AC
  Filled 2017-08-12: qty 1

## 2017-08-12 MED ORDER — BUPIVACAINE-EPINEPHRINE 0.25% -1:200000 IJ SOLN
INTRAMUSCULAR | Status: DC | PRN
  Administered 2017-08-12: 30 mL

## 2017-08-12 MED ORDER — METHOCARBAMOL 1000 MG/10ML IJ SOLN
500.0000 mg | Freq: Four times a day (QID) | INTRAVENOUS | Status: DC | PRN
  Filled 2017-08-12: qty 5

## 2017-08-12 MED ORDER — MENTHOL 3 MG MT LOZG: 1.0000 | LOZENGE | OROMUCOSAL | Status: DC | PRN

## 2017-08-12 MED ORDER — VANCOMYCIN HCL IN DEXTROSE 1-5 GM/200ML-% IV SOLN
1000.0000 mg | INTRAVENOUS | Status: AC
  Administered 2017-08-12: 1000 mg via INTRAVENOUS
  Filled 2017-08-12: qty 200

## 2017-08-12 MED ORDER — CHLORHEXIDINE GLUCONATE 4 % EX LIQD: 60.0000 mL | Freq: Once | CUTANEOUS | Status: DC

## 2017-08-12 MED ORDER — PROPOFOL 500 MG/50ML IV EMUL
INTRAVENOUS | Status: DC | PRN
  Administered 2017-08-12: 75 ug/kg/min via INTRAVENOUS

## 2017-08-12 MED ORDER — ACETAMINOPHEN 325 MG PO TABS: 650.0000 mg | ORAL_TABLET | ORAL | Status: DC | PRN

## 2017-08-12 MED ORDER — ONDANSETRON HCL 4 MG PO TABS: 4.0000 mg | ORAL_TABLET | Freq: Four times a day (QID) | ORAL | Status: DC | PRN

## 2017-08-12 MED ORDER — METOCLOPRAMIDE HCL 5 MG PO TABS: 5.0000 mg | ORAL_TABLET | Freq: Three times a day (TID) | ORAL | Status: DC | PRN

## 2017-08-12 MED ORDER — LATANOPROST 0.005 % OP SOLN
1.0000 [drp] | Freq: Every day | OPHTHALMIC | Status: DC
  Administered 2017-08-13 – 2017-08-14 (×2): 1 [drp] via OPHTHALMIC
  Filled 2017-08-12: qty 2.5

## 2017-08-12 MED ORDER — ONDANSETRON HCL 4 MG/2ML IJ SOLN
INTRAMUSCULAR | Status: DC | PRN
  Administered 2017-08-12: 4 mg via INTRAVENOUS

## 2017-08-12 MED ORDER — DEXAMETHASONE SODIUM PHOSPHATE 10 MG/ML IJ SOLN
INTRAMUSCULAR | Status: DC | PRN
  Administered 2017-08-12: 10 mg via INTRAVENOUS

## 2017-08-12 MED ORDER — VANCOMYCIN HCL 1000 MG IV SOLR
INTRAVENOUS | Status: DC | PRN
  Administered 2017-08-12: 1000 mg via INTRAVENOUS

## 2017-08-12 MED ORDER — BISACODYL 5 MG PO TBEC: 5.0000 mg | DELAYED_RELEASE_TABLET | Freq: Every day | ORAL | Status: DC | PRN

## 2017-08-12 MED ORDER — TRANEXAMIC ACID 1000 MG/10ML IV SOLN
1000.0000 mg | INTRAVENOUS | Status: AC
  Filled 2017-08-12: qty 10

## 2017-08-12 MED ORDER — HYDROMORPHONE HCL 1 MG/ML IJ SOLN
INTRAMUSCULAR | Status: AC
  Administered 2017-08-12: 1 mg via INTRAVENOUS
  Filled 2017-08-12: qty 1

## 2017-08-12 MED ORDER — MIDAZOLAM HCL 2 MG/2ML IJ SOLN: 2.0000 mg | Freq: Once | INTRAMUSCULAR | Status: DC

## 2017-08-12 MED ORDER — ASPIRIN EC 325 MG PO TBEC: 325.0000 mg | DELAYED_RELEASE_TABLET | Freq: Two times a day (BID) | ORAL | 0 refills | Status: DC

## 2017-08-12 MED ORDER — ONDANSETRON HCL 4 MG/2ML IJ SOLN
INTRAMUSCULAR | Status: AC
  Filled 2017-08-12: qty 2

## 2017-08-12 MED ORDER — FENTANYL CITRATE (PF) 100 MCG/2ML IJ SOLN: 25.0000 ug | INTRAMUSCULAR | Status: DC | PRN

## 2017-08-12 MED ORDER — KETOROLAC TROMETHAMINE 30 MG/ML IJ SOLN
INTRAMUSCULAR | Status: DC | PRN
  Administered 2017-08-12: 30 mg

## 2017-08-12 MED ORDER — LACTATED RINGERS IV SOLN
INTRAVENOUS | Status: DC
  Administered 2017-08-12 (×3): via INTRAVENOUS

## 2017-08-12 MED ORDER — BUPIVACAINE IN DEXTROSE 0.75-8.25 % IT SOLN
INTRATHECAL | Status: DC | PRN
  Administered 2017-08-12: 1.8 mL via INTRATHECAL

## 2017-08-12 MED ORDER — DIPHENHYDRAMINE HCL 12.5 MG/5ML PO ELIX: 12.5000 mg | ORAL_SOLUTION | ORAL | Status: DC | PRN

## 2017-08-12 MED ORDER — PROPOFOL 10 MG/ML IV BOLUS
INTRAVENOUS | Status: DC | PRN
  Administered 2017-08-12 (×2): 10 mg via INTRAVENOUS

## 2017-08-12 MED ORDER — ACETAMINOPHEN 10 MG/ML IV SOLN
INTRAVENOUS | Status: DC | PRN
  Administered 2017-08-12: 1000 mg via INTRAVENOUS

## 2017-08-12 MED ORDER — CLONAZEPAM 0.5 MG PO TABS
0.2500 mg | ORAL_TABLET | Freq: Two times a day (BID) | ORAL | Status: DC
  Administered 2017-08-12 – 2017-08-15 (×6): 0.25 mg via ORAL
  Filled 2017-08-12 (×6): qty 1

## 2017-08-12 MED ORDER — METOCLOPRAMIDE HCL 5 MG/ML IJ SOLN: 5.0000 mg | Freq: Three times a day (TID) | INTRAMUSCULAR | Status: DC | PRN

## 2017-08-12 MED ORDER — MIDAZOLAM HCL 5 MG/5ML IJ SOLN
INTRAMUSCULAR | Status: DC | PRN
  Administered 2017-08-12: 2 mg via INTRAVENOUS

## 2017-08-12 MED ORDER — DEXAMETHASONE SODIUM PHOSPHATE 10 MG/ML IJ SOLN
10.0000 mg | Freq: Once | INTRAMUSCULAR | Status: AC
Start: 2017-08-12 — End: 2017-08-15
  Administered 2017-08-15: 13:00:00 10 mg via INTRAVENOUS
  Filled 2017-08-12 (×2): qty 1

## 2017-08-12 MED ORDER — HYDROMORPHONE HCL 1 MG/ML IJ SOLN
1.0000 mg | INTRAMUSCULAR | Status: DC | PRN
  Administered 2017-08-12 – 2017-08-14 (×6): 1 mg via INTRAVENOUS
  Filled 2017-08-12 (×5): qty 1

## 2017-08-12 MED ORDER — OXYCODONE HCL 5 MG/5ML PO SOLN: 5.0000 mg | Freq: Once | ORAL | Status: DC | PRN

## 2017-08-12 MED ORDER — BUPIVACAINE-EPINEPHRINE (PF) 0.25% -1:200000 IJ SOLN
INTRAMUSCULAR | Status: AC
  Filled 2017-08-12: qty 30

## 2017-08-12 MED ORDER — FERROUS SULFATE 325 (65 FE) MG PO TABS
325.0000 mg | ORAL_TABLET | Freq: Three times a day (TID) | ORAL | Status: DC
  Administered 2017-08-13 – 2017-08-15 (×8): 325 mg via ORAL
  Filled 2017-08-12 (×8): qty 1

## 2017-08-12 MED ORDER — SODIUM CHLORIDE 0.9 % IJ SOLN
INTRAMUSCULAR | Status: DC | PRN
  Administered 2017-08-12: 30 mL

## 2017-08-12 MED ORDER — FENTANYL CITRATE (PF) 100 MCG/2ML IJ SOLN
INTRAMUSCULAR | Status: DC | PRN
  Administered 2017-08-12: 75 ug via INTRAVENOUS
  Administered 2017-08-12: 25 ug via INTRAVENOUS

## 2017-08-12 MED ORDER — DEXAMETHASONE SODIUM PHOSPHATE 10 MG/ML IJ SOLN
INTRAMUSCULAR | Status: AC
  Filled 2017-08-12: qty 1

## 2017-08-12 MED ORDER — ONDANSETRON HCL 4 MG/2ML IJ SOLN: 4.0000 mg | Freq: Four times a day (QID) | INTRAMUSCULAR | Status: DC | PRN

## 2017-08-12 MED ORDER — PROPOFOL 10 MG/ML IV BOLUS
INTRAVENOUS | Status: AC
  Filled 2017-08-12: qty 60

## 2017-08-12 MED ORDER — ROPIVACAINE HCL 7.5 MG/ML IJ SOLN
INTRAMUSCULAR | Status: DC | PRN
  Administered 2017-08-12: 20 mL via PERINEURAL

## 2017-08-12 MED ORDER — PANTOPRAZOLE SODIUM 40 MG PO TBEC
40.0000 mg | DELAYED_RELEASE_TABLET | Freq: Every day | ORAL | Status: DC
  Administered 2017-08-13 – 2017-08-15 (×3): 40 mg via ORAL
  Filled 2017-08-12 (×3): qty 1

## 2017-08-12 MED ORDER — ALUM & MAG HYDROXIDE-SIMETH 200-200-20 MG/5ML PO SUSP
30.0000 mL | ORAL | Status: DC | PRN
  Filled 2017-08-12: qty 30

## 2017-08-12 MED ORDER — METHOCARBAMOL 500 MG PO TABS
500.0000 mg | ORAL_TABLET | Freq: Four times a day (QID) | ORAL | Status: DC | PRN
  Administered 2017-08-13 – 2017-08-15 (×5): 500 mg via ORAL
  Filled 2017-08-12 (×5): qty 1

## 2017-08-12 MED ORDER — ENOXAPARIN SODIUM 30 MG/0.3ML ~~LOC~~ SOLN
30.0000 mg | Freq: Two times a day (BID) | SUBCUTANEOUS | Status: DC
  Administered 2017-08-13 – 2017-08-15 (×5): 30 mg via SUBCUTANEOUS
  Filled 2017-08-12 (×5): qty 0.3

## 2017-08-12 MED ORDER — VANCOMYCIN HCL IN DEXTROSE 1-5 GM/200ML-% IV SOLN
1000.0000 mg | Freq: Two times a day (BID) | INTRAVENOUS | Status: AC
  Administered 2017-08-13: 1000 mg via INTRAVENOUS
  Filled 2017-08-12: qty 200

## 2017-08-12 MED ORDER — TRANEXAMIC ACID 1000 MG/10ML IV SOLN
INTRAVENOUS | Status: DC | PRN
  Administered 2017-08-12: 1000 mg via INTRAVENOUS

## 2017-08-12 MED ORDER — KETOROLAC TROMETHAMINE 30 MG/ML IJ SOLN
INTRAMUSCULAR | Status: AC
  Filled 2017-08-12: qty 1

## 2017-08-12 MED ORDER — MIDAZOLAM HCL 2 MG/2ML IJ SOLN
INTRAMUSCULAR | Status: AC
  Filled 2017-08-12: qty 2

## 2017-08-12 MED ORDER — POLYETHYLENE GLYCOL 3350 17 G PO PACK: 17.0000 g | PACK | Freq: Every day | ORAL | Status: DC | PRN

## 2017-08-12 MED ORDER — PHENOL 1.4 % MT LIQD: 1.0000 | OROMUCOSAL | Status: DC | PRN

## 2017-08-12 MED ORDER — ACETAMINOPHEN 650 MG RE SUPP: 650.0000 mg | RECTAL | Status: DC | PRN

## 2017-08-12 MED ORDER — OXYCODONE HCL 5 MG PO TABS
10.0000 mg | ORAL_TABLET | ORAL | Status: DC | PRN
  Administered 2017-08-14 (×4): 10 mg via ORAL
  Filled 2017-08-12 (×4): qty 2

## 2017-08-12 MED ORDER — LEVOTHYROXINE SODIUM 50 MCG PO TABS
50.0000 ug | ORAL_TABLET | Freq: Every day | ORAL | Status: DC
  Administered 2017-08-12 – 2017-08-14 (×3): 50 ug via ORAL
  Filled 2017-08-12 (×3): qty 1

## 2017-08-12 MED ORDER — BREXPIPRAZOLE 1 MG PO TABS
1.0000 mg | ORAL_TABLET | Freq: Every day | ORAL | Status: DC
  Administered 2017-08-13 – 2017-08-15 (×3): 1 mg via ORAL
  Filled 2017-08-12 (×3): qty 1

## 2017-08-12 MED ORDER — MAGNESIUM CITRATE PO SOLN: 1.0000 | Freq: Once | ORAL | Status: DC | PRN

## 2017-08-12 MED ORDER — BACLOFEN 10 MG PO TABS: 10.0000 mg | ORAL_TABLET | Freq: Three times a day (TID) | ORAL | 1 refills | Status: DC | PRN

## 2017-08-12 MED ORDER — NORTRIPTYLINE HCL 25 MG PO CAPS
100.0000 mg | ORAL_CAPSULE | Freq: Every day | ORAL | Status: DC
  Administered 2017-08-12 – 2017-08-14 (×3): 100 mg via ORAL
  Filled 2017-08-12 (×3): qty 4

## 2017-08-12 MED ORDER — SODIUM CHLORIDE 0.9 % IR SOLN
Status: DC | PRN
  Administered 2017-08-12: 1000 mL

## 2017-08-12 SURGICAL SUPPLY — 56 items
BAG DECANTER FOR FLEXI CONT (MISCELLANEOUS) IMPLANT
BAG ZIPLOCK 12X15 (MISCELLANEOUS) ×6 IMPLANT
BANDAGE ACE 4X5 VEL STRL LF (GAUZE/BANDAGES/DRESSINGS) ×3 IMPLANT
BANDAGE ACE 6X5 VEL STRL LF (GAUZE/BANDAGES/DRESSINGS) ×3 IMPLANT
BLADE SAG 18X100X1.27 (BLADE) ×3 IMPLANT
BLADE SAW SGTL 13.0X1.19X90.0M (BLADE) ×3 IMPLANT
BOWL SMART MIX CTS (DISPOSABLE) ×3 IMPLANT
CAP KNEE TOTAL 3 SIGMA ×3 IMPLANT
CEMENT HV SMART SET (Cement) ×6 IMPLANT
COVER SURGICAL LIGHT HANDLE (MISCELLANEOUS) ×3 IMPLANT
CUFF TOURN SGL QUICK 34 (TOURNIQUET CUFF) ×2
CUFF TRNQT CYL 34X4X40X1 (TOURNIQUET CUFF) ×1 IMPLANT
DECANTER SPIKE VIAL GLASS SM (MISCELLANEOUS) ×3 IMPLANT
DERMABOND ADVANCED (GAUZE/BANDAGES/DRESSINGS) ×2
DERMABOND ADVANCED .7 DNX12 (GAUZE/BANDAGES/DRESSINGS) ×1 IMPLANT
DRAPE U-SHAPE 47X51 STRL (DRAPES) ×3 IMPLANT
DRSG AQUACEL AG ADV 3.5X10 (GAUZE/BANDAGES/DRESSINGS) ×3 IMPLANT
DRSG TEGADERM 4X4.75 (GAUZE/BANDAGES/DRESSINGS) ×3 IMPLANT
DURAPREP 26ML APPLICATOR (WOUND CARE) ×6 IMPLANT
ELECT REM PT RETURN 15FT ADLT (MISCELLANEOUS) ×3 IMPLANT
EVACUATOR 1/8 PVC DRAIN (DRAIN) ×3 IMPLANT
GAUZE SPONGE 2X2 8PLY STRL LF (GAUZE/BANDAGES/DRESSINGS) ×1 IMPLANT
GLOVE BIOGEL PI IND STRL 8 (GLOVE) ×2 IMPLANT
GLOVE BIOGEL PI INDICATOR 8 (GLOVE) ×4
GLOVE ECLIPSE 8.0 STRL XLNG CF (GLOVE) ×6 IMPLANT
GLOVE SURG ORTHO 9.0 STRL STRW (GLOVE) ×3 IMPLANT
GLOVE SURG SS PI 7.5 STRL IVOR (GLOVE) ×3 IMPLANT
GOWN STRL REUS W/TWL XL LVL3 (GOWN DISPOSABLE) ×6 IMPLANT
HANDPIECE INTERPULSE COAX TIP (DISPOSABLE) ×2
IMMOBILIZER KNEE 20 (SOFTGOODS) ×3
IMMOBILIZER KNEE 20 THIGH 36 (SOFTGOODS) ×1 IMPLANT
NS IRRIG 1000ML POUR BTL (IV SOLUTION) ×3 IMPLANT
PACK TOTAL KNEE CUSTOM (KITS) ×3 IMPLANT
POSITIONER SURGICAL ARM (MISCELLANEOUS) ×3 IMPLANT
SET HNDPC FAN SPRY TIP SCT (DISPOSABLE) ×1 IMPLANT
SET PAD KNEE POSITIONER (MISCELLANEOUS) ×3 IMPLANT
SPONGE GAUZE 2X2 STER 10/PKG (GAUZE/BANDAGES/DRESSINGS) ×2
SPONGE LAP 18X18 X RAY DECT (DISPOSABLE) IMPLANT
SPONGE SURGIFOAM ABS GEL 100 (HEMOSTASIS) ×3 IMPLANT
STOCKINETTE 6  STRL (DRAPES) ×2
STOCKINETTE 6 STRL (DRAPES) ×1 IMPLANT
SUCTION FRAZIER HANDLE 12FR (TUBING) ×2
SUCTION TUBE FRAZIER 12FR DISP (TUBING) ×1 IMPLANT
SUT BONE WAX W31G (SUTURE) IMPLANT
SUT MNCRL AB 3-0 PS2 18 (SUTURE) ×3 IMPLANT
SUT VIC AB 1 CT1 27 (SUTURE) ×8
SUT VIC AB 1 CT1 27XBRD ANTBC (SUTURE) ×4 IMPLANT
SUT VIC AB 2-0 CT1 27 (SUTURE) ×4
SUT VIC AB 2-0 CT1 TAPERPNT 27 (SUTURE) ×2 IMPLANT
SUT VLOC 180 0 24IN GS25 (SUTURE) ×3 IMPLANT
SYR 50ML LL SCALE MARK (SYRINGE) ×3 IMPLANT
TAPE STRIPS DRAPE STRL (GAUZE/BANDAGES/DRESSINGS) ×3 IMPLANT
TRAY FOLEY W/METER SILVER 16FR (SET/KITS/TRAYS/PACK) ×3 IMPLANT
WATER STERILE IRR 1000ML POUR (IV SOLUTION) ×6 IMPLANT
WRAP KNEE MAXI GEL POST OP (GAUZE/BANDAGES/DRESSINGS) ×3 IMPLANT
YANKAUER SUCT BULB TIP 10FT TU (MISCELLANEOUS) ×3 IMPLANT

## 2017-08-12 NOTE — Anesthesia Procedure Notes (Signed)
Anesthesia Regional Block: Adductor canal block   Pre-Anesthetic Checklist: ,, timeout performed, Correct Patient, Correct Site, Correct Laterality, Correct Procedure, Correct Position, site marked, Risks and benefits discussed,  Surgical consent,  Pre-op evaluation,  At surgeon's request and post-op pain management  Laterality: Lower and Right  Prep: chloraprep       Needles:  Injection technique: Single-shot  Needle Type: Echogenic Stimulator Needle          Additional Needles:   Narrative:  Start time: 08/12/2017 4:21 PM End time: 08/12/2017 4:24 PM Injection made incrementally with aspirations every 5 mL.  Performed by: Personally  Anesthesiologist: Oleta Mouse, MD  Additional Notes: H+P and labs reviewed, risks and benefits discussed with patient, procedure tolerated well without complications

## 2017-08-12 NOTE — Interval H&P Note (Signed)
History and Physical Interval Note:  08/12/2017 1:44 PM  Elrama  has presented today for surgery, with the diagnosis of Right knee osteoarthritis  The various methods of treatment have been discussed with the patient and family. After consideration of risks, benefits and other options for treatment, the patient has consented to  Procedure(s): RIGHT TOTAL KNEE ARTHROPLASTY (Right) as a surgical intervention .  The patient's history has been reviewed, patient examined, no change in status, stable for surgery.  I have reviewed the patient's chart and labs.  Questions were answered to the patient's satisfaction.     Duana Benedict ANDREW

## 2017-08-12 NOTE — Anesthesia Preprocedure Evaluation (Signed)
Anesthesia Evaluation  Patient identified by MRN, date of birth, ID band Patient awake    Reviewed: Allergy & Precautions, NPO status , Patient's Chart, lab work & pertinent test results  History of Anesthesia Complications Negative for: history of anesthetic complications  Airway Mallampati: II  TM Distance: >3 FB Neck ROM: Full    Dental  (+) Teeth Intact   Pulmonary former smoker,    breath sounds clear to auscultation       Cardiovascular negative cardio ROS   Rhythm:Regular     Neuro/Psych PSYCHIATRIC DISORDERS Anxiety Depression negative neurological ROS     GI/Hepatic Neg liver ROS, GERD  Medicated and Controlled,  Endo/Other  Hypothyroidism   Renal/GU negative Renal ROS     Musculoskeletal  (+) Arthritis ,   Abdominal   Peds  Hematology negative hematology ROS (+)   Anesthesia Other Findings   Reproductive/Obstetrics                             Anesthesia Physical Anesthesia Plan  ASA: II  Anesthesia Plan: MAC, Spinal and Regional   Post-op Pain Management:  Regional for Post-op pain   Induction:   PONV Risk Score and Plan: 2 and Treatment may vary due to age or medical condition  Airway Management Planned: Nasal Cannula  Additional Equipment: None  Intra-op Plan:   Post-operative Plan:   Informed Consent: I have reviewed the patients History and Physical, chart, labs and discussed the procedure including the risks, benefits and alternatives for the proposed anesthesia with the patient or authorized representative who has indicated his/her understanding and acceptance.   Dental advisory given  Plan Discussed with: CRNA and Surgeon  Anesthesia Plan Comments:         Anesthesia Quick Evaluation

## 2017-08-12 NOTE — Transfer of Care (Signed)
Immediate Anesthesia Transfer of Care Note  Patient: Judy Morrison  Procedure(s) Performed: RIGHT TOTAL KNEE ARTHROPLASTY (Right Knee)  Patient Location: PACU  Anesthesia Type:Spinal  Level of Consciousness: awake, alert , oriented and patient cooperative  Airway & Oxygen Therapy: Patient Spontanous Breathing and Patient connected to face mask oxygen  Post-op Assessment: Report given to RN and Post -op Vital signs reviewed and stable  Post vital signs: stable  Last Vitals:  Vitals:   08/12/17 1329 08/12/17 1532  BP: 134/66 (!) 143/77  Pulse: 92 98  Resp: 18 (!) 22  Temp: 36.6 C   SpO2: 100% 97%    Last Pain:  Vitals:   08/12/17 1335  TempSrc:   PainSc: 3       Patients Stated Pain Goal: 4 (58/83/25 4982)  Complications: No apparent anesthesia complications

## 2017-08-12 NOTE — Op Note (Signed)
DATE OF SURGERY:  08/12/2017  TIME: 6:11 PM  PATIENT NAME:  Judy Morrison    AGE: 66 y.o.   PRE-OPERATIVE DIAGNOSIS:  Right knee osteoarthritis  POST-OPERATIVE DIAGNOSIS:  right knee osteoarthritis  PROCEDURE:  Procedure(s): RIGHT TOTAL KNEE ARTHROPLASTY  SURGEON:  Kiegan Macaraeg ANDREW  ASSISTANT:  Bryson Stilwell, PA-C, present and scrubbed throughout the case, critical for assistance with exposure, retraction, instrumentation, and closure.  OPERATIVE IMPLANTS: Depuy PFC Sigma Rotating Platform.  Femur size 4, Tibia size 3, Patella size 32 3-peg oval button, with a 10 mm polyethylene insert.   PREOPERATIVE INDICATIONS:   Judy Morrison is a 66 y.o. year old female with end stage bone on bone arthritis of the knee who failed conservative treatment and elected for Total Knee Arthroplasty.   The risks, benefits, and alternatives were discussed at length including but not limited to the risks of infection, bleeding, nerve injury, stiffness, blood clots, the need for revision surgery, cardiopulmonary complications, among others, and they were willing to proceed.  OPERATIVE DESCRIPTION:  The patient was brought to the operative room and placed in a supine position.  Spinal anesthesia was administered.  IV antibiotics were given.  The lower extremity was prepped and draped in the usual sterile fashion.  Time out was performed.  The leg was elevated and exsanguinated and the tourniquet was inflated.  Anterior quadriceps tendon splitting approach was performed.  The patella was retracted and osteophytes were removed.  The anterior horn of the medial and lateral meniscus was removed and cruciate ligaments resected.   The distal femur was opened with the drill and the intramedullary distal femoral cutting jig was utilized, set at 5 degrees resecting 10 mm off the distal femur.  Care was taken to protect the collateral ligaments.  The distal femoral sizing jig was applied,  taking care to avoid notching.  Then the 4-in-1 cutting jig was applied and the anterior and posterior femur was cut, along with the chamfer cuts.    Then the extramedullary tibial cutting jig was utilized making the appropriate cut using the anterior tibial crest as a reference building in appropriate posterior slope.  Care was taken during the cut to protect the medial and collateral ligaments.  The proximal tibia was removed along with the posterior horns of the menisci.   The posterior medial femoral osteophytes and posterior lateral femoral osteophytes were removed.    The flexion gap was then measured and was symmetric with the extension gap, measured at 10.  I completed the distal femoral preparation using the appropriate jig to prepare the box.  The patella was then measured, and cut with the saw.    The proximal tibia sized and prepared accordingly with the reamer and the punch, and then all components were trialed with the trial insert.  The knee was found to have excellent balance and full motion.    The above named components were then cemented into place and all excess cement was removed.  The trial polyethylene component was in place during cementation, and then was exchanged for the real polyethylene component.    The knee was easily taken through a range of motion and the patella tracked well and the knee irrigated copiously and the parapatellar and subcutaneous tissue closed with vicryl, and monocryl with steri strips for the skin.  The arthrotomy was closed at 90 of flexion. The wounds were dressed with sterile gauze and the tourniquet released and the patient was awakened and returned to the PACU  in stable and satisfactory condition.  There were no complications.  Total tourniquet time was 85 minutes.

## 2017-08-12 NOTE — Anesthesia Procedure Notes (Signed)
Spinal  Patient location during procedure: OR End time: 08/12/2017 4:21 PM Staffing Resident/CRNA: Lissa Morales, CRNA Performed: anesthesiologist and resident/CRNA  Preanesthetic Checklist Completed: patient identified, site marked, surgical consent, pre-op evaluation, timeout performed, IV checked, risks and benefits discussed and monitors and equipment checked Spinal Block Patient position: sitting Prep: DuraPrep Patient monitoring: heart rate, continuous pulse ox and blood pressure Approach: midline Location: L3-4 Injection technique: single-shot Needle Needle type: Pencan  Needle gauge: 24 G Needle length: 9 cm Additional Notes Expiration date of kit checked and confirmed. Patient tolerated procedure well, without complications.

## 2017-08-12 NOTE — Anesthesia Procedure Notes (Addendum)
Procedure Name: Intubation Date/Time: 08/12/2017 4:23 PM Performed by: Lissa Morales, CRNA Pre-anesthesia Checklist: Patient identified, Emergency Drugs available, Suction available and Patient being monitored Patient Re-evaluated:Patient Re-evaluated prior to induction Oxygen Delivery Method: Simple face mask Airway Equipment and Method: Oral airway Placement Confirmation: positive ETCO2

## 2017-08-13 LAB — BASIC METABOLIC PANEL
ANION GAP: 6 (ref 5–15)
BUN: 13 mg/dL (ref 6–20)
CALCIUM: 8.6 mg/dL — AB (ref 8.9–10.3)
CO2: 27 mmol/L (ref 22–32)
Chloride: 103 mmol/L (ref 101–111)
Creatinine, Ser: 0.69 mg/dL (ref 0.44–1.00)
Glucose, Bld: 167 mg/dL — ABNORMAL HIGH (ref 65–99)
Potassium: 3.8 mmol/L (ref 3.5–5.1)
Sodium: 136 mmol/L (ref 135–145)

## 2017-08-13 LAB — CBC
HEMATOCRIT: 33.1 % — AB (ref 36.0–46.0)
Hemoglobin: 10.7 g/dL — ABNORMAL LOW (ref 12.0–15.0)
MCH: 27.1 pg (ref 26.0–34.0)
MCHC: 32.3 g/dL (ref 30.0–36.0)
MCV: 83.8 fL (ref 78.0–100.0)
Platelets: 328 10*3/uL (ref 150–400)
RBC: 3.95 MIL/uL (ref 3.87–5.11)
RDW: 14.4 % (ref 11.5–15.5)
WBC: 13 10*3/uL — AB (ref 4.0–10.5)

## 2017-08-13 NOTE — Evaluation (Signed)
Physical Therapy Evaluation Patient Details Name: Judy Morrison MRN: [id number] DOB: 12-Oct-1950 Today's Date: 08/13/2017   History of Present Illness  s/p R TKA,; Hx: Right rotator cuff partial tear  Clinical Impression  Pt is s/p TKA resulting in the deficits listed below (see PT Problem List). * Pt will benefit from skilled PT to increase their independence and safety with mobility to allow discharge to the venue listed below. Will benefit from SNF prior to D/C home alone     Follow Up Recommendations SNF(pt planning for [facility])    Equipment Recommendations  None recommended by PT    Recommendations for Other Services       Precautions / Restrictions Precautions Precautions: Fall Restrictions Weight Bearing Restrictions: No Other Position/Activity Restrictions: WBAT      Mobility  Bed Mobility Overal bed mobility: Needs Assistance Bed Mobility: Supine to Sit     Supine to sit: Min guard     General bed mobility comments: for RLE  Transfers Overall transfer level: Needs assistance Equipment used: Rolling walker (2 wheeled) Transfers: Sit to/from Stand Sit to Stand: Min guard         General transfer comment: cues for hand placement and technique  Ambulation/Gait Ambulation/Gait assistance: Min guard;Supervision Ambulation Distance (Feet): 80 Feet Assistive device: Rolling walker (2 wheeled) Gait Pattern/deviations: Step-to pattern;Decreased weight shift to right     General Gait Details: cues for sequence  Stairs            Wheelchair Mobility    Modified Rankin (Stroke Patients Only)       Balance                                             Pertinent Vitals/Pain Pain Assessment: 0-10 Pain Score: 4  Pain Location: right knee Pain Descriptors / Indicators: Discomfort;Aching Pain Intervention(s): Limited activity within patient's tolerance;Monitored during session;Premedicated before session;Ice applied     Home Living Family/patient expects to be discharged to:: Skilled nursing facility Living Arrangements: Alone   Type of Home: Apartment Home Access: Elevator     Home Layout: One level Home Equipment: Hand held shower head;Shower seat - built in      Prior Function Level of Independence: Independent               Hand Dominance   Dominant Hand: Right    Extremity/Trunk Assessment   Upper Extremity Assessment Upper Extremity Assessment: Defer to OT evaluation RUE Deficits / Details: limited AROM due to rotator cuff problems LUE Deficits / Details: limited AROM due to rotator cuff problems    Lower Extremity Assessment Lower Extremity Assessment: RLE deficits/detail RLE Deficits / Details: ankle WFL; knee extension and hip flexion 3/5; AAROM ~10* to 60*    Cervical / Trunk Assessment Cervical / Trunk Assessment: Normal  Communication   Communication: No difficulties  Cognition Arousal/Alertness: Awake/alert Behavior During Therapy: WFL for tasks assessed/performed Overall Cognitive Status: Within Functional Limits for tasks assessed                                        General Comments General comments (skin integrity, edema, etc.): patient demonstrated ability to lift leg off of bed so KI not indicated    Exercises Total Joint Exercises Ankle Circles/Pumps: AROM;Both;10 reps  Quad Sets: Both;AROM;10 reps   Assessment/Plan    PT Assessment Patient needs continued PT services  PT Problem List Decreased strength;Decreased range of motion;Decreased mobility;Decreased knowledge of use of DME;Pain       PT Treatment Interventions Gait training;DME instruction;Therapeutic activities;Functional mobility training;Therapeutic exercise    PT Goals (Current goals can be found in the Care Plan section)  Acute Rehab PT Goals Patient Stated Goal: to go to [facility] prior to discharge home alone PT Goal Formulation: With patient Time For Goal  Achievement: 08/20/17 Potential to Achieve Goals: Good    Frequency 7X/week   Barriers to discharge        Co-evaluation PT/OT/SLP Co-Evaluation/Treatment: Yes Reason for Co-Treatment: For patient/therapist safety PT goals addressed during session: Mobility/safety with mobility OT goals addressed during session: ADL's and self-care       AM-PAC PT "6 Clicks" Daily Activity  Outcome Measure Difficulty turning over in bed (including adjusting bedclothes, sheets and blankets)?: Unable Difficulty moving from lying on back to sitting on the side of the bed? : Unable Difficulty sitting down on and standing up from a chair with arms (e.g., wheelchair, bedside commode, etc,.)?: A Little Help needed moving to and from a bed to chair (including a wheelchair)?: A Little Help needed walking in hospital room?: A Little Help needed climbing 3-5 steps with a railing? : A Lot 6 Click Score: 13    End of Session Equipment Utilized During Treatment: Gait belt Activity Tolerance: Patient tolerated treatment well Patient left: with call bell/phone within reach;in chair   PT Visit Diagnosis: Difficulty in walking, not elsewhere classified (R26.2)    Time: 4742-5956 PT Time Calculation (min) (ACUTE ONLY): 30 min   Charges:   PT Evaluation $PT Eval Low Complexity: 1 Low     PT G Codes:          Cera Rorke Aug 21, 2017, 12:25 PM

## 2017-08-13 NOTE — Progress Notes (Signed)
08/13/17 1600  PT Visit Information  Last PT Received On 08/13/17  Pt progressing well; will benefit from SNF to allow return to [facility] Needed +1  History of Present Illness s/p R TKA,; Hx: Right rotator cuff partial tear  Subjective Data  Patient Stated Goal to go to [facility] prior to discharge home alone  Precautions  Precautions Fall  Precaution Comments Ind SLRs  Restrictions  Weight Bearing Restrictions No  Other Position/Activity Restrictions WBAT  Pain Assessment  Pain Assessment 0-10  Pain Score 5  Pain Location right knee  Pain Descriptors / Indicators Discomfort;Aching  Pain Intervention(s) Limited activity within patient's tolerance;Monitored during session;Repositioned  Cognition  Arousal/Alertness Awake/alert  Behavior During Therapy WFL for tasks assessed/performed  Overall Cognitive Status Within Functional Limits for tasks assessed  Bed Mobility  Overal bed mobility Needs Assistance  Bed Mobility Supine to Sit;Sit to Supine  Supine to sit Min guard  Sit to supine Min guard  General bed mobility comments for safety  Transfers  Overall transfer level Needs assistance  Equipment used Rolling walker (2 wheeled)  Transfers Sit to/from Stand  Sit to Stand Min guard  General transfer comment cues for hand placement and technique  Ambulation/Gait  Ambulation/Gait assistance Min guard;Supervision  Ambulation Distance (Feet) 100 Feet (50')  Assistive device Rolling walker (2 wheeled)  Gait Pattern/deviations Step-to pattern;Decreased weight shift to right  General Gait Details cues for gait progression  Total Joint Exercises  Ankle Circles/Pumps AROM;Both;10 reps  [employer] Both;AROM;10 reps  Short Arc Quad AROM;Right;10 reps  Heel Slides AAROM;Right;10 reps  Hip ABduction/ADduction AROM;Strengthening;Right;10 reps  Straight Leg Raises AROM;Strengthening;Right;10 reps  PT - End of Session  Equipment Utilized During Treatment Gait belt  Activity  Tolerance Patient tolerated treatment well  Patient left in bed;with call bell/phone within reach;with family/visitor present  PT - Assessment/Plan  PT Plan Current plan remains appropriate  PT Visit Diagnosis Difficulty in walking, not elsewhere classified (R26.2)  PT Frequency (ACUTE ONLY) 7X/week  Follow Up Recommendations SNF  PT equipment None recommended by PT  AM-PAC PT "6 Clicks" Daily Activity Outcome Measure  Difficulty turning over in bed (including adjusting bedclothes, sheets and blankets)? 3  Difficulty moving from lying on back to sitting on the side of the bed?  3  Difficulty sitting down on and standing up from a chair with arms (e.g., wheelchair, bedside commode, etc,.)? 3  Help needed moving to and from a bed to chair (including a wheelchair)? 3  Help needed walking in hospital room? 3  Help needed climbing 3-5 steps with a railing?  3  6 Click Score 18  Mobility G Code  CK  PT Goal Progression  Progress towards PT goals Progressing toward goals  Acute Rehab PT Goals  PT Goal Formulation With patient  Time For Goal Achievement 08/20/17  Potential to Achieve Goals Good  PT Time Calculation  PT Start Time (ACUTE ONLY) 1515  PT Stop Time (ACUTE ONLY) 1539  PT Time Calculation (min) (ACUTE ONLY) 24 min  PT General Charges  $$ ACUTE PT VISIT 1 Visit  PT Treatments  $Gait Training 8-22 mins  $Therapeutic Exercise 8-22 mins

## 2017-08-13 NOTE — Progress Notes (Signed)
     Subjective: 1 Day Post-Op Procedure(s) (LRB): RIGHT TOTAL KNEE ARTHROPLASTY (Right)   Patient reports pain as mild, pain controlled.  No events throughout the night.  Some nausea this morning, but feels it has resolved now..  Denies BM or flatus to this point.  Plan on d/c home when ready.  Objective:   VITALS:   Vitals:   08/13/17 0104 08/13/17 0558  BP: 119/68 116/63  Pulse: 96 95  Resp: 15 17  Temp: 97.7 F (36.5 C) 97.6 F (36.4 C)  SpO2: 96% 100%    Dorsiflexion/Plantar flexion intact Incision: dressing C/D/I No cellulitis present Compartment soft  LABS Recent Labs    08/13/17 0533  HGB 10.7*  HCT 33.1*  WBC 13.0*  PLT 328    Recent Labs    08/13/17 0533  NA 136  K 3.8  BUN 13  CREATININE 0.69  GLUCOSE 167*     Assessment/Plan: 1 Day Post-Op Procedure(s) (LRB): RIGHT TOTAL KNEE ARTHROPLASTY (Right) HV drain pulled Foley cath d/c'ed Advance diet Up with therapy D/C IV fluids Discharge home when ready, possibly tomorrow   [place]. Allyce Bochicchio   PAC  08/13/2017, 8:48 AM

## 2017-08-13 NOTE — Progress Notes (Signed)
Pt arrived to room 1617 via stretcher and transferred to bed without difficulty. Report received from Emmett RN/PACU. Education initiated with pt on pain medication and use of call bell for safety.  Pt verbalized understanding. Call bell within reach of patient

## 2017-08-13 NOTE — Anesthesia Postprocedure Evaluation (Signed)
Anesthesia Post Note  Patient: King  Procedure(s) Performed: RIGHT TOTAL KNEE ARTHROPLASTY (Right Knee)     Patient location during evaluation: PACU Anesthesia Type: MAC, Regional and Spinal Level of consciousness: awake and alert Pain management: pain level controlled Vital Signs Assessment: post-procedure vital signs reviewed and stable Respiratory status: spontaneous breathing, nonlabored ventilation, respiratory function stable and patient connected to nasal cannula oxygen Cardiovascular status: stable and blood pressure returned to baseline Postop Assessment: no apparent nausea or vomiting and spinal receding Anesthetic complications: no    Last Vitals:  Vitals:   08/13/17 1430 08/13/17 2100  BP: 132/66 109/84  Pulse: 99 (!) 113  Resp: 16 17  Temp: 36.8 C 36.8 C  SpO2: 100% 95%    Last Pain:  Vitals:   08/13/17 2100  TempSrc: Oral  PainSc:                  Imad Shostak

## 2017-08-13 NOTE — Progress Notes (Signed)
Occupational Therapy Evaluation Patient Details Name: Judy Morrison MRN: [id number] DOB: 1950-09-16 Today's Date: 08/13/2017    History of Present Illness s/p R TKA,; Hx: Right rotator cuff partial tear   Clinical Impression   Patient presents to OT with decreased ADL independence s/p R TKA. Will benefit from skilled OT to maximize function and to facilitate Judy safe discharge. Patient plans to go to SNF prior to discharge as she lives alone. OT will follow.    Follow Up Recommendations  SNF    Equipment Recommendations  3 in 1 bedside commode    Recommendations for Other Services       Precautions / Restrictions Precautions Precautions: Fall Restrictions Weight Bearing Restrictions: No Other Position/Activity Restrictions: WBAT      Mobility Bed Mobility Overal bed mobility: Needs Assistance Bed Mobility: Supine to Sit     Supine to sit: Min guard        Transfers Overall transfer level: Needs assistance Equipment used: Rolling walker (2 wheeled) Transfers: Sit to/from Stand Sit to Stand: Min guard         General transfer comment: cues for hand placement and technique    Balance                                           ADL either performed or assessed with clinical judgement   ADL Overall ADL's : Needs assistance/impaired Eating/Feeding: Independent;Sitting   Grooming: Wash/dry hands;Oral care;Supervision/safety;Standing   Upper Body Bathing: Set up;Sitting   Lower Body Bathing: Min guard;Sit to/from stand   Upper Body Dressing : Set up;Sitting   Lower Body Dressing: Min guard;Sit to/from stand   Toilet Transfer: Min guard;Ambulation;BSC;RW   Toileting- Water quality scientist and Hygiene: Min guard;Sit to/from stand       Functional mobility during ADLs: Surveyor, minerals     Praxis      Pertinent Vitals/Pain Pain Assessment: 0-10 Pain Score: 4  Pain Location:  right knee Pain Descriptors / Indicators: Discomfort;Aching Pain Intervention(s): Limited activity within patient's tolerance;Monitored during session;Premedicated before session;Ice applied     Hand Dominance Right   Extremity/Trunk Assessment Upper Extremity Assessment Upper Extremity Assessment: RUE deficits/detail;LUE deficits/detail RUE Deficits / Details: limited AROM due to rotator cuff problems LUE Deficits / Details: limited AROM due to rotator cuff problems   Lower Extremity Assessment Lower Extremity Assessment: Defer to PT evaluation   Cervical / Trunk Assessment Cervical / Trunk Assessment: Normal   Communication Communication Communication: No difficulties   Cognition Arousal/Alertness: Awake/alert Behavior During Therapy: WFL for tasks assessed/performed Overall Cognitive Status: Within Functional Limits for tasks assessed                                     General Comments  patient demonstrated ability to lift leg off of bed so KI not indicated    Exercises     Shoulder Instructions      Home Living Family/patient expects to be discharged to:: Skilled nursing facility Living Arrangements: Alone   Type of Home: Apartment Home Access: Elevator     Home Layout: One level     Bathroom Shower/Tub: Occupational psychologist: (comfort height)     Home Equipment: Hand  held shower head;Shower seat - built in          Prior Functioning/Environment Level of Independence: Independent                 OT Problem List: Decreased activity tolerance;Decreased knowledge of use of DME or AE;Pain      OT Treatment/Interventions: Self-care/ADL training;DME and/or AE instruction;Therapeutic activities;Patient/family education    OT Goals(Current goals can be found in the care plan section) Acute Rehab OT Goals Patient Stated Goal: to go to [facility] prior to discharge home alone OT Goal Formulation: With patient Time For Goal  Achievement: 08/27/17 Potential to Achieve Goals: Good ADL Goals Pt Will Transfer to Toilet: with modified independence Pt Will Perform Toileting - Clothing Manipulation and hygiene: with modified independence Pt Will Perform Tub/Shower Transfer: Shower transfer;with modified independence;rolling walker;ambulating  OT Frequency: Min 2X/week   Barriers to D/C: Decreased caregiver support          Co-evaluation PT/OT/SLP Co-Evaluation/Treatment: Yes Reason for Co-Treatment: For patient/therapist safety PT goals addressed during session: Mobility/safety with mobility OT goals addressed during session: ADL's and self-care      AM-PAC PT "6 Clicks" Daily Activity     Outcome Measure Help from another person eating meals?: None Help from another person taking care of personal grooming?: Judy Little Help from another person toileting, which includes using toliet, bedpan, or urinal?: Judy Little Help from another person bathing (including washing, rinsing, drying)?: Judy Little Help from another person to put on and taking off regular upper body clothing?: None Help from another person to put on and taking off regular lower body clothing?: None 6 Click Score: 21   End of Session Equipment Utilized During Treatment: Gait belt;Rolling walker Nurse Communication: Mobility status  Activity Tolerance: Patient tolerated treatment well Patient left: in chair;with call bell/phone within reach;with family/visitor present  OT Visit Diagnosis: Unsteadiness on feet (R26.81);Muscle weakness (generalized) (M62.81)                Time: 0930-1000 OT Time Calculation (min): 30 min Charges:  OT General Charges $OT Visit: 1 Visit OT Evaluation $OT Eval Low Complexity: 1 Low G-Codes:       Judy Morrison Judy Morrison August 18, 2017, 11:17 AM

## 2017-08-14 LAB — CBC
HEMATOCRIT: 29.4 % — AB (ref 36.0–46.0)
HEMOGLOBIN: 9.7 g/dL — AB (ref 12.0–15.0)
MCH: 27.5 pg (ref 26.0–34.0)
MCHC: 33 g/dL (ref 30.0–36.0)
MCV: 83.3 fL (ref 78.0–100.0)
PLATELETS: 295 10*3/uL (ref 150–400)
RBC: 3.53 MIL/uL — AB (ref 3.87–5.11)
RDW: 14.8 % (ref 11.5–15.5)
WBC: 13.5 10*3/uL — ABNORMAL HIGH (ref 4.0–10.5)

## 2017-08-14 NOTE — Progress Notes (Signed)
Pt reports feeling like "coming down with the flu". Noted with low grade temp of 99.2. Dr. Durward Fortes made aware. MD said will access pt when done with surgery in the OR. Pt ans dtr made aware. IS use encouraged. Hale Bogus.

## 2017-08-14 NOTE — Progress Notes (Signed)
Physical Therapy Treatment Patient Details Name: Judy Morrison MRN: [id number] DOB: 10/08/1950 Today's Date: 08/14/2017    History of Present Illness s/p R TKA,; Hx: Right rotator cuff partial tear    PT Comments    Pt with some issues with pain this afternoon but overall improving; pt motivated to work within [employer] is for rehab at [facility] tomorrow  Follow Up Recommendations  SNF     Equipment Recommendations  None recommended by PT    Recommendations for Other Services       Precautions / Restrictions Precautions Precautions: Fall;Knee Knee Immobilizer - Right: Discontinue once straight leg raise with < 10 degree lag Restrictions Other Position/Activity Restrictions: WBAT    Mobility  Bed Mobility Overal bed mobility: Needs Assistance Bed Mobility: Supine to Sit;Sit to Supine     Supine to sit: Min assist Sit to supine: Min assist   General bed mobility comments: assist with RLE  Transfers Overall transfer level: Needs assistance Equipment used: Rolling walker (2 wheeled) Transfers: Sit to/from Stand Sit to Stand: Min guard         General transfer comment: cues for hand placement and technique  Ambulation/Gait Ambulation/Gait assistance: Min guard;Supervision Ambulation Distance (Feet): 100 Feet Assistive device: Rolling walker (2 wheeled) Gait Pattern/deviations: Step-to pattern;Decreased weight shift to right     General Gait Details: cues for use of UEs for pain control/decr wt on RLE   Stairs            Wheelchair Mobility    Modified Rankin (Stroke Patients Only)       Balance                                            Cognition Arousal/Alertness: Awake/alert Behavior During Therapy: WFL for tasks assessed/performed Overall Cognitive Status: Within Functional Limits for tasks assessed                                        Exercises Total Joint Exercises Ankle Circles/Pumps:  AROM;Both;10 reps Short Arc Quad: AROM;Right;10 reps Heel Slides: AAROM;Right;10 reps Hip ABduction/ADduction: AROM;Strengthening;Right;10 reps Straight Leg Raises: AAROM;Strengthening;Right;10 reps Goniometric ROM: ~10* to  60*    General Comments        Pertinent Vitals/Pain Pain Assessment: 0-10 Pain Score: 5  Pain Location: right knee Pain Descriptors / Indicators: Discomfort;Aching Pain Intervention(s): Limited activity within patient's tolerance;Monitored during session;Premedicated before session    Home Living                      Prior Function            PT Goals (current goals can now be found in the care plan section) Acute Rehab PT Goals Patient Stated Goal: to go to [facility] prior to discharge home alone PT Goal Formulation: With patient Time For Goal Achievement: 08/20/17 Potential to Achieve Goals: Good Progress towards PT goals: Progressing toward goals    Frequency    7X/week      PT Plan Current plan remains appropriate    Co-evaluation              AM-PAC PT "6 Clicks" Daily Activity  Outcome Measure  Difficulty turning over in bed (including adjusting bedclothes, sheets and blankets)?: Unable Difficulty moving from lying  on back to sitting on the side of the bed? : Unable Difficulty sitting down on and standing up from a chair with arms (e.g., wheelchair, bedside commode, etc,.)?: Unable Help needed moving to and from a bed to chair (including a wheelchair)?: A Little Help needed walking in hospital room?: A Little Help needed climbing 3-5 steps with a railing? : A Little 6 Click Score: 12    End of Session   Activity Tolerance: Patient tolerated treatment well Patient left: in bed;with call bell/phone within reach;with family/visitor present   PT Visit Diagnosis: Difficulty in walking, not elsewhere classified (R26.2)     Time: 0347-4259 PT Time Calculation (min) (ACUTE ONLY): 23 min  Charges:  $Gait Training:  8-22 mins $Therapeutic Exercise: 8-22 mins                    G Codes:          Gracious Renken 08/16/2017, 4:26 PM

## 2017-08-14 NOTE — Progress Notes (Signed)
   Subjective: 2 Days Post-Op Procedure(s) (LRB): RIGHT TOTAL KNEE ARTHROPLASTY (Right)  Pt did very well with therapy yesterday Plan to d/c to [facility] tomorrow Denies any new symptoms or issues Patient reports pain as mild.  Objective:   VITALS:   Vitals:   08/13/17 2100 08/14/17 0458  BP: 109/84 125/69  Pulse: (!) 113 (!) 109  Resp: 17 18  Temp: 98.2 F (36.8 C) 98.6 F (37 C)  SpO2: 95% 96%    Right knee incision healing well nv intact distally No rashes or edema  LABS Recent Labs    08/13/17 0533 08/14/17 0534  HGB 10.7* 9.7*  HCT 33.1* 29.4*  WBC 13.0* 13.5*  PLT 328 295    Recent Labs    08/13/17 0533  NA 136  K 3.8  BUN 13  CREATININE 0.69  GLUCOSE 167*     Assessment/Plan: 2 Days Post-Op Procedure(s) (LRB): RIGHT TOTAL KNEE ARTHROPLASTY (Right) Plan to d/c to SNF tomorrow Pain management as needed Pulmonary toilet Continue therapy Overall doing well   Merla Riches, MPAS, PA-C  08/14/2017, 7:50 AM

## 2017-08-14 NOTE — Progress Notes (Signed)
Physical Therapy Treatment Patient Details Name: BECCI BATTY MRN: [id number] DOB: July 25, 1951 Today's Date: 08/14/2017    History of Present Illness s/p R TKA,; Hx: Right rotator cuff partial tear    PT Comments    More pain today, pain level limiting ex's but able to amb and hoping to shower today if pain diminishes; continue to recommend SNF; will see again today if schedule permits;  Follow Up Recommendations  SNF     Equipment Recommendations  None recommended by PT    Recommendations for Other Services       Precautions / Restrictions Precautions Precautions: Fall;Knee Precaution Comments: unable to perform IND SLRs today, KI applied Required Braces or Orthoses: Knee Immobilizer - Right Knee Immobilizer - Right: Discontinue once straight leg raise with < 10 degree lag Restrictions Weight Bearing Restrictions: No Other Position/Activity Restrictions: WBAT    Mobility  Bed Mobility Overal bed mobility: Needs Assistance Bed Mobility: Supine to Sit     Supine to sit: Min assist     General bed mobility comments: assist with RLE  Transfers Overall transfer level: Needs assistance Equipment used: Rolling walker (2 wheeled) Transfers: Sit to/from Stand Sit to Stand: Min guard         General transfer comment: cues for hand placement and technique  Ambulation/Gait Ambulation/Gait assistance: Min guard;Supervision Ambulation Distance (Feet): 80 Feet Assistive device: Rolling walker (2 wheeled) Gait Pattern/deviations: Step-to pattern;Decreased weight shift to right     General Gait Details: cues for use of UEs for pain control/decr wt on RLE   Stairs            Wheelchair Mobility    Modified Rankin (Stroke Patients Only)       Balance                                            Cognition Arousal/Alertness: Awake/alert Behavior During Therapy: WFL for tasks assessed/performed Overall Cognitive Status: Within  Functional Limits for tasks assessed                                        Exercises Total Joint Exercises Ankle Circles/Pumps: AROM;Both;10 reps Quad Sets: Limitations Quad Sets Limitations: pain limiting ex program    General Comments        Pertinent Vitals/Pain Pain Assessment: 0-10 Pain Score: 7  Pain Location: right knee Pain Descriptors / Indicators: Discomfort;Aching Pain Intervention(s): Limited activity within patient's tolerance;Monitored during session;Premedicated before session;Repositioned;Ice applied    Home Living                      Prior Function            PT Goals (current goals can now be found in the care plan section) Acute Rehab PT Goals Patient Stated Goal: to go to [facility] prior to discharge home alone PT Goal Formulation: With patient Time For Goal Achievement: 08/20/17 Potential to Achieve Goals: Good Progress towards PT goals: Progressing toward goals    Frequency    7X/week      PT Plan Current plan remains appropriate    Co-evaluation              AM-PAC PT "6 Clicks" Daily Activity  Outcome Measure  Difficulty turning over in bed (  including adjusting bedclothes, sheets and blankets)?: Unable Difficulty moving from lying on back to sitting on the side of the bed? : Unable Difficulty sitting down on and standing up from a chair with arms (e.g., wheelchair, bedside commode, etc,.)?: Unable Help needed moving to and from a bed to chair (including a wheelchair)?: A Little Help needed walking in hospital room?: A Little Help needed climbing 3-5 steps with a railing? : A Little 6 Click Score: 12    End of Session Equipment Utilized During Treatment: Right knee immobilizer Activity Tolerance: Patient tolerated treatment well Patient left: in chair;with call bell/phone within reach;with family/visitor present   PT Visit Diagnosis: Difficulty in walking, not elsewhere classified (R26.2)      Time: 1025-8527 PT Time Calculation (min) (ACUTE ONLY): 13 min  Charges:  $Gait Training: 8-22 mins                    G Codes:          Gatsby Chismar 08/26/2017, 10:58 AM

## 2017-08-14 NOTE — NC FL2 (Signed)
[place] MEDICAID FL2 LEVEL OF CARE SCREENING TOOL     IDENTIFICATION  Patient Name: Judy Morrison Birthdate: Sep 08, 1951 Sex: female Admission Date (Current Location): 08/12/2017  [facility] and [place] Number:  Herbalist and Address:  [facility],  [place] [address], [place]      Provider Number: [id number]  Attending Physician Name and Address:  Sydnee Cabal, MD  Relative Name and Phone Number:       Current Level of Care: Hospital Recommended Level of Care: [place] Prior Approval Number:    Date Approved/Denied:   PASRR Number: pending  Discharge Plan: SNF    Current Diagnoses: Patient Active Problem List   Diagnosis Date Noted  . S/P knee replacement 08/12/2017  . Incomplete tear of right rotator cuff 05/19/2017  . Osteopenia 05/19/2017  . Urge incontinence of urine 05/19/2017  . Primary open angle glaucoma (POAG) 12/30/2016  . Primary osteoarthritis of both knees 12/30/2016  . Recurrent oral herpes simplex infection 12/30/2016  . Tubular adenoma of colon 12/30/2016  . Hypothyroidism 06/25/2016  . Right foot pain 12/19/2014  . Anxiety 05/21/2011  . Seasonal allergies 05/21/2011    Orientation RESPIRATION BLADDER Height & Weight     Self, Time, Situation, Place  Normal Continent Weight: 151 lb (68.5 kg) Height:  5\' 5"  (165.1 cm)  BEHAVIORAL SYMPTOMS/MOOD NEUROLOGICAL BOWEL NUTRITION STATUS      Continent Diet(See DC Summary)  AMBULATORY STATUS COMMUNICATION OF NEEDS Skin   Limited Assist Verbally Surgical wounds                       Personal Care Assistance Level of Assistance  Bathing, Feeding, Dressing Bathing Assistance: Limited assistance Feeding assistance: Independent Dressing Assistance: Limited assistance     Functional Limitations Info  Sight, Hearing, Speech Sight Info: Adequate Hearing Info: Adequate Speech Info: Adequate    SPECIAL CARE FACTORS FREQUENCY  PT (By  licensed PT), OT (By licensed OT)     PT Frequency: 5x week OT Frequency: 5x week            Contractures Contractures Info: Not present    Additional Factors Info  Code Status, Allergies Code Status Info: Full Allergies Info: CODEINE, ERYTHROMYCIN, PENICILLINS  Psychotropic Info: n/a         Current Medications (08/14/2017):  This is the current hospital active medication list Current Facility-Administered Medications  Medication Dose Route Frequency Provider Last Rate Last Dose  . 0.9 %  sodium chloride infusion   Intravenous Continuous Stilwell, Bryson L, PA-C 75 mL/hr at 08/13/17 0336    . acetaminophen (TYLENOL) tablet 650 mg  650 mg Oral Q4H PRN Stilwell, Bryson L, PA-C       Or  . acetaminophen (TYLENOL) suppository 650 mg  650 mg Rectal Q4H PRN Stilwell, Bryson L, PA-C      . alum & mag hydroxide-simeth (MAALOX/MYLANTA) 200-200-20 MG/5ML suspension 30 mL  30 mL Oral Q4H PRN Stilwell, Bryson L, PA-C      . bisacodyl (DULCOLAX) EC tablet 5 mg  5 mg Oral Daily PRN Stilwell, Bryson L, PA-C      . Brexpiprazole TABS 1 mg  1 mg Oral Daily Stilwell, Bryson L, PA-C   1 mg at 08/14/17 0949  . clonazePAM (KLONOPIN) tablet 0.25 mg  0.25 mg Oral BID Stilwell, Bryson L, PA-C   0.25 mg at 08/14/17 0946  . dexamethasone (DECADRON) injection 10 mg  10 mg Intravenous Once Stilwell,  Bryson L, PA-C      . diphenhydrAMINE (BENADRYL) 12.5 MG/5ML elixir 12.5-25 mg  12.5-25 mg Oral Q4H PRN Stilwell, Bryson L, PA-C      . docusate sodium (COLACE) capsule 100 mg  100 mg Oral BID Stilwell, Bryson L, PA-C   100 mg at 08/14/17 0946  . enoxaparin (LOVENOX) injection 30 mg  30 mg Subcutaneous Q12H Stilwell, Bryson L, PA-C   30 mg at 08/14/17 0846  . fentaNYL (SUBLIMAZE) injection 100 mcg  100 mcg Intravenous Once Oleta Mouse, MD      . ferrous sulfate tablet 325 mg  325 mg Oral TID PC Stilwell, Bryson L, PA-C   325 mg at 08/14/17 0845  . HYDROmorphone (DILAUDID) injection 1 mg  1 mg Intravenous  Q2H PRN Stilwell, Bryson L, PA-C   1 mg at 08/13/17 2215  . lactated ringers infusion   Intravenous Continuous Oleta Mouse, MD 20 mL/hr at 08/12/17 1346    . latanoprost (XALATAN) 0.005 % ophthalmic solution 1 drop  1 drop Both Eyes QHS Stilwell, Bryson L, PA-C   1 drop at 08/13/17 2208  . levothyroxine (SYNTHROID, LEVOTHROID) tablet 50 mcg  50 mcg Oral QHS Stilwell, Bryson L, PA-C   50 mcg at 08/13/17 2208  . magnesium citrate solution 1 Bottle  1 Bottle Oral Once PRN Stilwell, Bryson L, PA-C      . menthol-cetylpyridinium (CEPACOL) lozenge 3 mg  1 lozenge Oral PRN Stilwell, Bryson L, PA-C       Or  . phenol (CHLORASEPTIC) mouth spray 1 spray  1 spray Mouth/Throat PRN Stilwell, Bryson L, PA-C      . methocarbamol (ROBAXIN) tablet 500 mg  500 mg Oral Q6H PRN Stilwell, Bryson L, PA-C   500 mg at 08/14/17 0845   Or  . methocarbamol (ROBAXIN) 500 mg in dextrose 5 % 50 mL IVPB  500 mg Intravenous Q6H PRN Stilwell, Bryson L, PA-C      . metoCLOPramide (REGLAN) tablet 5-10 mg  5-10 mg Oral Q8H PRN Stilwell, Bryson L, PA-C       Or  . metoCLOPramide (REGLAN) injection 5-10 mg  5-10 mg Intravenous Q8H PRN Stilwell, Bryson L, PA-C      . midazolam (VERSED) injection 2 mg  2 mg Intravenous Once Oleta Mouse, MD      . nortriptyline (PAMELOR) capsule 100 mg  100 mg Oral QHS Stilwell, Bryson L, PA-C   100 mg at 08/13/17 2208  . ondansetron (ZOFRAN) tablet 4 mg  4 mg Oral Q6H PRN Stilwell, Bryson L, PA-C       Or  . ondansetron (ZOFRAN) injection 4 mg  4 mg Intravenous Q6H PRN Stilwell, Bryson L, PA-C      . oxyCODONE (Oxy IR/ROXICODONE) immediate release tablet 10 mg  10 mg Oral Q3H PRN Stilwell, Bryson L, PA-C   10 mg at 08/14/17 0845  . oxyCODONE (Oxy IR/ROXICODONE) immediate release tablet 5 mg  5 mg Oral Q3H PRN Stilwell, Bryson L, PA-C   5 mg at 08/13/17 1547  . pantoprazole (PROTONIX) EC tablet 40 mg  40 mg Oral Daily Stilwell, Bryson L, PA-C   40 mg at 08/14/17 0946  . polyethylene  glycol (MIRALAX / GLYCOLAX) packet 17 g  17 g Oral Daily PRN Stilwell, Sueanne Margarita, PA-C         Discharge Medications: Please see discharge summary for a list of discharge medications.  Relevant Imaging Results:  Relevant Lab Results:   Additional Information SS: [id number]  [place], LCSW

## 2017-08-14 NOTE — Clinical Social Work Note (Signed)
Clinical Social Work Assessment  Patient Details  Name: Judy Morrison MRN: [id number] Date of Birth: [birth year]  Date of referral:  08/14/17               Reason for consult:  Facility Placement                Permission sought to share information with:  Chartered certified accountant granted to share information::  Yes, Verbal Permission Granted  Name::     Educational psychologist::  SNF  Relationship::  daughter  Contact Information:     Housing/Transportation Living arrangements for the past 2 months:  Single Family Home Source of Information:  Patient Patient Interpreter Needed:  None Criminal Activity/Legal Involvement Pertinent to Current Situation/Hospitalization:  No - Comment as needed Significant Relationships:  Adult Children, Other Family Members Lives with:  Self Do you feel safe going back to the place where you live?  No Need for family participation in patient care:  No (Coment)  Care giving concerns:  Pt resides at home alone and independent with ADL's prior to hospitalization. Pt agreeable to SNF and has prearranged for SNF placement at [facility].  CSW will f/u with disposition.  Social Worker assessment / plan:  CSW will f/u with SNF on placement, offer sent to [facility], FL2 sent out, PASSR pending as NCMUST down until midnight 12/2.  CSW will assist with transport to SNF.  Employment status:  Retired Forensic scientist:  Medicare PT Recommendations:  [place] / Referral to community resources:  [place]  Patient/Family's Response to care:  Patient appreciative of CSW assistance with SNF placement. No issues or concerns identified at this time.  Patient/Family's Understanding of and Emotional Response to Diagnosis, Current Treatment, and Prognosis:  Patient has good understanding of diagnosis, current treatment and prognosis as she is amenable to SNF placement and understands that she is not  able to return home independently. Pt hopeful that she will return to independence after skilled nursing. No issues or concerns identified.  Emotional Assessment Appearance:  Appears stated age Attitude/Demeanor/Rapport:  (Cooperative) Affect (typically observed):  Accepting, Appropriate Orientation:  Oriented to Situation, Oriented to  Time, Oriented to Place, Oriented to Self Alcohol / Substance use:  Not Applicable Psych involvement (Current and /or in the community):  No (Comment)  Discharge Needs  Concerns to be addressed:  Discharge Planning Concerns Readmission within the last 30 days:  No Current discharge risk:  Dependent with Mobility, Physical Impairment Barriers to Discharge:  No Barriers Identified   Normajean Baxter, LCSW 08/14/2017, 11:55 AM

## 2017-08-14 NOTE — Discharge Summary (Signed)
Physician Discharge Summary  Patient ID: Judy Morrison MRN: [id number] DOB/AGE: 1951-05-20 66 y.o.  Admit date: 08/12/2017 Discharge date: 08/15/2017  Admission Diagnoses:Right knee primary OA  Discharge Diagnoses:  Active Problems:   S/P knee replacement   Discharged Condition: good  Hospital Course: Judy Morrison is a 66 y.o. who was admitted to [facility]. They were brought to the operating room on 08/12/2017 and underwent Procedure(s): RIGHT TOTAL KNEE ARTHROPLASTY.  Patient tolerated the procedure well and was later transferred to the recovery room and then to the orthopaedic floor for postoperative care.  They were given PO and IV analgesics for pain control following their surgery.  They were given 24 hours of postoperative antibiotics of  Anti-infectives (From admission, onward)   Start     Dose/Rate Route Frequency Ordered Stop   08/13/17 0600  vancomycin (VANCOCIN) IVPB 1000 mg/200 mL premix     1,000 mg 200 mL/hr over 60 Minutes Intravenous On call to O.R. 08/12/17 1320 08/12/17 1640   08/13/17 0400  vancomycin (VANCOCIN) IVPB 1000 mg/200 mL premix     1,000 mg 200 mL/hr over 60 Minutes Intravenous Every 12 hours 08/12/17 1941 08/13/17 0514     and started on DVT prophylaxis in the form of Lovenox.   PT and OT were ordered for total joint protocol.  Discharge planning consulted to help with postop disposition and equipment needs.  Patient had a good night on the evening of surgery and started to get up OOB with therapy on day one.  Hemovac drain was pulled without difficulty.  Continued to work with therapy into day two.  Dressing was with normal limits.  The patient had progressed with therapy and meeting their goals. Patient was seen in rounds and was ready to go home.  Consults: None  Significant Diagnostic Studies: labs  Treatments: antibiotics: vancomycin   Discharge Exam: Blood pressure 133/72, pulse (!) 106, temperature 99.2 F (37.3 C),  temperature source Oral, resp. rate 18, height 5\' 5"  (1.651 m), weight 68.5 kg (151 lb), SpO2 98 %. Well nourished. Alert and oriented x3. RRR, Lungs clear, BS x4. Abdomen soft and non tender. Right Calf soft and non tender. Right knee dressing C/D/I. No DVT signs. Compartment soft. No signs of infection.  Right LE grossly neurovascular intact.Well nourished.  Disposition: 01-Home or Self Care  Discharge Instructions    Call MD / Call 911   Complete by:  As directed    If you experience chest pain or shortness of breath, CALL 911 and be transported to the hospital emergency room.  If you develope a fever above 101 F, pus (white drainage) or increased drainage or redness at the wound, or calf pain, call your surgeon's office.   Constipation Prevention   Complete by:  As directed    Drink plenty of fluids.  Prune juice may be helpful.  You may use a stool softener, such as Colace (over the counter) 100 mg twice a day.  Use MiraLax (over the counter) for constipation as needed.   Diet - low sodium heart healthy   Complete by:  As directed    Discharge instructions   Complete by:  As directed    INSTRUCTIONS AFTER JOINT REPLACEMENT   Remove items at home which could result in a fall. This includes throw rugs or furniture in walking pathways ICE to the affected joint every three hours while awake for 30 minutes at a time, for at least the first 3-5 days, and  then as needed for pain and swelling.  Continue to use ice for pain and swelling. You may notice swelling that will progress down to the foot and ankle.  This is normal after surgery.  Elevate your leg when you are not up walking on it.   Continue to use the breathing machine you got in the hospital (incentive spirometer) which will help keep your temperature down.  It is common for your temperature to cycle up and down following surgery, especially at night when you are not up moving around and exerting yourself.  The breathing machine keeps your  lungs expanded and your temperature down.   DIET:  As you were doing prior to hospitalization, we recommend a well-balanced diet.  DRESSING / WOUND CARE / SHOWERING  Keep the surgical dressing until follow up.  The dressing is water proof, so you can shower without any extra covering.  IF THE DRESSING FALLS OFF or the wound gets wet inside, change the dressing with sterile gauze.  Please use good hand washing techniques before changing the dressing.  Do not use any lotions or creams on the incision until instructed by your surgeon.    ACTIVITY  Increase activity slowly as tolerated, but follow the weight bearing instructions below.   No driving for 6 weeks or until further direction given by your physician.  You cannot drive while taking narcotics.  No lifting or carrying greater than 10 lbs. until further directed by your surgeon. Avoid periods of inactivity such as sitting longer than an hour when not asleep. This helps prevent blood clots.  You may return to work once you are authorized by your doctor.     WEIGHT BEARING   Weight bearing as tolerated with assist device (walker, cane, etc) as directed, use it as long as suggested by your surgeon or therapist, typically at least 4-6 weeks.   EXERCISES  Results after joint replacement surgery are often greatly improved when you follow the exercise, range of motion and muscle strengthening exercises prescribed by your doctor. Safety measures are also important to protect the joint from further injury. Any time any of these exercises cause you to have increased pain or swelling, decrease what you are doing until you are comfortable again and then slowly increase them. If you have problems or questions, call your caregiver or physical therapist for advice.   Rehabilitation is important following a joint replacement. After just a few days of immobilization, the muscles of the leg can become weakened and shrink (atrophy).  These exercises are  designed to build up the tone and strength of the thigh and leg muscles and to improve motion. Often times heat used for twenty to thirty minutes before working out will loosen up your tissues and help with improving the range of motion but do not use heat for the first two weeks following surgery (sometimes heat can increase post-operative swelling).   These exercises can be done on a training (exercise) mat, on the floor, on a table or on a bed. Use whatever works the best and is most comfortable for you.    Use music or television while you are exercising so that the exercises are a pleasant break in your day. This will make your life better with the exercises acting as a break in your routine that you can look forward to.   Perform all exercises about fifteen times, three times per day or as directed.  You should exercise both the operative leg and the  other leg as well.   Exercises include:   Quad Sets - Tighten up the muscle on the front of the thigh (Quad) and hold for 5-10 seconds.   Straight Leg Raises - With your knee straight (if you were given a brace, keep it on), lift the leg to 60 degrees, hold for 3 seconds, and slowly lower the leg.  Perform this exercise against resistance later as your leg gets stronger.  Leg Slides: Lying on your back, slowly slide your foot toward your buttocks, bending your knee up off the floor (only go as far as is comfortable). Then slowly slide your foot back down until your leg is flat on the floor again.  Angel Wings: Lying on your back spread your legs to the side as far apart as you can without causing discomfort.  Hamstring Strength:  Lying on your back, push your heel against the floor with your leg straight by tightening up the muscles of your buttocks.  Repeat, but this time bend your knee to a comfortable angle, and push your heel against the floor.  You may put a pillow under the heel to make it more comfortable if necessary.   A rehabilitation program  following joint replacement surgery can speed recovery and prevent re-injury in the future due to weakened muscles. Contact your doctor or a physical therapist for more information on knee rehabilitation.    CONSTIPATION  Constipation is defined medically as fewer than three stools per week and severe constipation as less than one stool per week.  Even if you have a regular bowel pattern at home, your normal regimen is likely to be disrupted due to multiple reasons following surgery.  Combination of anesthesia, postoperative narcotics, change in appetite and fluid intake all can affect your bowels.   YOU MUST use at least one of the following options; they are listed in order of increasing strength to get the job done.  They are all available over the counter, and you may need to use some, POSSIBLY even all of these options:    Drink plenty of fluids (prune juice may be helpful) and high fiber foods Colace 100 mg by mouth twice a day  Senokot for constipation as directed and as needed Dulcolax (bisacodyl), take with full glass of water  Miralax (polyethylene glycol) once or twice a day as needed.  If you have tried all these things and are unable to have a bowel movement in the first 3-4 days after surgery call either your surgeon or your primary doctor.    If you experience loose stools or diarrhea, hold the medications until you stool forms back up.  If your symptoms do not get better within 1 week or if they get worse, check with your doctor.  If you experience "the worst abdominal pain ever" or develop nausea or vomiting, please contact the office immediately for further recommendations for treatment.   ITCHING:  If you experience itching with your medications, try taking only a single pain pill, or even half a pain pill at a time.  You can also use Benadryl over the counter for itching or also to help with sleep.   TED HOSE STOCKINGS:  Use stockings on both legs until for at least 2 weeks  or as directed by physician office. They may be removed at night for sleeping.  MEDICATIONS:  See your medication summary on the "After Visit Summary" that nursing will review with you.  You may have some home medications which  will be placed on hold until you complete the course of blood thinner medication.  It is important for you to complete the blood thinner medication as prescribed.  PRECAUTIONS:  If you experience chest pain or shortness of breath - call 911 immediately for transfer to the hospital emergency department.   If you develop a fever greater that 101 F, purulent drainage from wound, increased redness or drainage from wound, foul odor from the wound/dressing, or calf pain - CONTACT YOUR SURGEON.                                                   FOLLOW-UP APPOINTMENTS:  If you do not already have a post-op appointment, please call the office for an appointment to be seen by your surgeon.  Guidelines for how soon to be seen are listed in your "After Visit Summary", but are typically between 1-4 weeks after surgery.  OTHER INSTRUCTIONS:   Knee Replacement:  Do not place pillow under knee, focus on keeping the knee straight while resting. CPM instructions: 0-90 degrees, 2 hours in the morning, 2 hours in the afternoon, and 2 hours in the evening. Place foam block, curve side up under heel at all times except when in CPM or when walking.  DO NOT modify, tear, cut, or change the foam block in any way.  MAKE SURE YOU:  Understand these instructions.  Get help right away if you are not doing well or get worse.    Thank you for letting us be a part of your medical care team.  It is a privilege we respect greatly.  We hope these instructions will help you stay on track for a fast and full recovery!   Increase activity slowly as tolerated   Complete by:  As directed         Signed: Deone Leifheit L 08/14/2017, 3:13 PM

## 2017-08-14 NOTE — Progress Notes (Signed)
   Subjective: 2 Days Post-Op Procedure(s) (LRB): RIGHT TOTAL KNEE ARTHROPLASTY (Right)  Asked to reevaluate patient due to feeling flushed and ? Flu Pt states she feels a little warm and achy Family present and worried about her condition Also c/o increased soreness after walking yesterday Patient reports pain as mild.  Objective:   VITALS:   Vitals:   08/14/17 0458 08/14/17 1430  BP: 125/69 133/72  Pulse: (!) 109 (!) 106  Resp: 18 18  Temp: 98.6 F (37 C) 99.2 F (37.3 C)  SpO2: 96% 98%    Right knee incision healing well nv intact distally No rashes or edema No frank fever  LABS Recent Labs    08/13/17 0533 08/14/17 0534  HGB 10.7* 9.7*  HCT 33.1* 29.4*  WBC 13.0* 13.5*  PLT 328 295    Recent Labs    08/13/17 0533  NA 136  K 3.8  BUN 13  CREATININE 0.69  GLUCOSE 167*     Assessment/Plan: 2 Days Post-Op Procedure(s) (LRB): RIGHT TOTAL KNEE ARTHROPLASTY (Right) Unfortunately rapid flu test not available Recommend supportive treatment with tylenol as needed Discussed with pt and family importance of incentive spirometry to eliminate symptoms     Merla Riches, MPAS, PA-C  08/14/2017, 4:00 PM

## 2017-08-15 ENCOUNTER — Encounter (HOSPITAL_COMMUNITY): Payer: Self-pay | Admitting: Specialist

## 2017-08-15 LAB — CBC
HEMATOCRIT: 28.2 % — AB (ref 36.0–46.0)
HEMOGLOBIN: 9.4 g/dL — AB (ref 12.0–15.0)
MCH: 27.6 pg (ref 26.0–34.0)
MCHC: 33.3 g/dL (ref 30.0–36.0)
MCV: 82.7 fL (ref 78.0–100.0)
Platelets: 260 10*3/uL (ref 150–400)
RBC: 3.41 MIL/uL — AB (ref 3.87–5.11)
RDW: 15 % (ref 11.5–15.5)
WBC: 10.8 10*3/uL — AB (ref 4.0–10.5)

## 2017-08-15 NOTE — Progress Notes (Signed)
Physical Therapy Treatment Patient Details Name: Judy Morrison MRN: [id number] DOB: 05-24-1951 Today's Date: 08/15/2017    History of Present Illness s/p R TKA,; Hx: Right rotator cuff partial tear    PT Comments    POD # 3  Applied KI and instructed on use.  Assisted with amb in hallway.  Performed some TKR TE's followed by ICE.   Follow Up Recommendations  SNF     Equipment Recommendations  None recommended by PT    Recommendations for Other Services       Precautions / Restrictions Precautions Precautions: Fall;Knee Precaution Comments: unable to perform IND SLRs today, KI applied Required Braces or Orthoses: Knee Immobilizer - Right Knee Immobilizer - Right: Discontinue once straight leg raise with < 10 degree lag Restrictions Weight Bearing Restrictions: No Other Position/Activity Restrictions: WBAT    Mobility  Bed Mobility Overal bed mobility: Needs Assistance Bed Mobility: Supine to Sit     Supine to sit: Min assist     General bed mobility comments: assist with RLE  Transfers Overall transfer level: Needs assistance Equipment used: Rolling walker (2 wheeled) Transfers: Sit to/from Stand Sit to Stand: Min guard         General transfer comment: 25% cues for hand placement and technique  Ambulation/Gait Ambulation/Gait assistance: Min guard;Supervision Ambulation Distance (Feet): 75 Feet Assistive device: Rolling walker (2 wheeled) Gait Pattern/deviations: Step-to pattern;Decreased weight shift to right Gait velocity: decreased   General Gait Details: cues for use of UEs for pain control/decr wt on RLE   Stairs            Wheelchair Mobility    Modified Rankin (Stroke Patients Only)       Balance                                            Cognition Arousal/Alertness: Awake/alert Behavior During Therapy: WFL for tasks assessed/performed Overall Cognitive Status: Within Functional Limits for tasks  assessed                                        Exercises   Total Knee Replacement TE's 10 reps B LE ankle pumps 10 reps towel squeezes 10 reps knee presses 10 reps heel slides  10 reps SAQ's 10 reps SLR's 10 reps ABD Followed by ICE    General Comments        Pertinent Vitals/Pain Pain Assessment: 0-10 Pain Score: 5  Pain Location: right knee Pain Descriptors / Indicators: Discomfort;Aching;Operative site guarding Pain Intervention(s): Monitored during session;Repositioned;Ice applied    Home Living                      Prior Function            PT Goals (current goals can now be found in the care plan section) Progress towards PT goals: Progressing toward goals    Frequency    7X/week      PT Plan Current plan remains appropriate    Co-evaluation              AM-PAC PT "6 Clicks" Daily Activity  Outcome Measure  Difficulty turning over in bed (including adjusting bedclothes, sheets and blankets)?: Unable Difficulty moving from lying on back to sitting on the side  of the bed? : Unable Difficulty sitting down on and standing up from a chair with arms (e.g., wheelchair, bedside commode, etc,.)?: Unable Help needed moving to and from a bed to chair (including a wheelchair)?: A Little Help needed walking in hospital room?: A Little Help needed climbing 3-5 steps with a railing? : A Little 6 Click Score: 12    End of Session Equipment Utilized During Treatment: Right knee immobilizer Activity Tolerance: Patient tolerated treatment well Patient left: in chair;with call bell/phone within reach;with family/visitor present Nurse Communication: Mobility status PT Visit Diagnosis: Difficulty in walking, not elsewhere classified (R26.2)     Time: 1140-1159 PT Time Calculation (min) (ACUTE ONLY): 19 min  Charges:  $Gait Training: 8-22 mins                    G Codes:       {Renee Beale  PTA [facility]  Acute  Rehab Pager       [phone]

## 2017-08-15 NOTE — Care Management Important Message (Signed)
Important Message  Patient Details  Name: Judy Morrison MRN: [id number] Date of Birth: 05/27/1951   Medicare Important Message Given:  Yes    Kerin Salen 08/15/2017, 10:25 [place] Message  Patient Details  Name: Judy Morrison MRN: [id number] Date of Birth: Sep 01, 1951   Medicare Important Message Given:  Yes    Kerin Salen 08/15/2017, 10:24 AM

## 2017-08-15 NOTE — Clinical Social Work Placement (Signed)
D/C Summary Sent  CLINICAL SOCIAL WORK PLACEMENT  NOTE  Date:  08/15/2017  Patient Details  Name: Judy Morrison MRN: [id number] Date of Birth: Feb 25, 1951  Clinical Social Work is seeking post-discharge placement for this patient at the [place] level of care (*CSW will initial, date and re-position this form in  chart as items are completed):  No   Patient/family provided with [place] Work Department's list of facilities offering this level of care within the geographic area requested by the patient (or if unable, by the patient's family).  Yes   Patient/family informed of their freedom to choose among providers that offer the needed level of care, that participate in Medicare, Medicaid or managed care program needed by the patient, have an available bed and are willing to accept the patient.  Yes   Patient/family informed of [facility]'s ownership interest in [facility] and [facility], as well as of the fact that they are under no obligation to receive care at these facilities.  PASRR submitted to EDS on 08/15/17     PASRR number received on 08/15/17     Existing PASRR number confirmed on       FL2 transmitted to all facilities in geographic area requested by pt/family on       FL2 transmitted to all facilities within larger geographic area on       Patient informed that his/her managed care company has contracts with or will negotiate with certain facilities, including the following:  [facility]     Yes   Patient/family informed of bed offers received.  Patient chooses bed at [facility] [place] recommends and patient chooses bed at      Patient to be transferred to [facility] [place] on 08/15/17.  Patient to be transferred to facility by       Patient family notified on 08/15/17 of transfer.  Name of family member notified:  Daughter at bedside     PHYSICIAN       Additional  Comment:    _______________________________________________ Lia Hopping, LCSW 08/15/2017, 12:30 PM

## 2017-08-15 NOTE — Progress Notes (Signed)
Subjective: 3 Days Post-Op Procedure(s) (LRB): RIGHT TOTAL KNEE ARTHROPLASTY (Right) Patient reports pain as mild to right knee.  Progressing with PT. Tolerating PO's. Denies CP,SOB,or calf pain. No Fever or chills.Reports she is ready for D/c to [place].  Objective: Vital signs in last 24 hours: Temp:  [98.3 F (36.8 C)-99.2 F (37.3 C)] 98.6 F (37 C) (12/03 0432) Pulse Rate:  [106-121] 121 (12/03 0432) Resp:  [17-18] 18 (12/03 0432) BP: (123-137)/(61-75) 137/61 (12/03 0432) SpO2:  [93 %-98 %] 93 % (12/03 0432)  Intake/Output from previous day: 12/02 0701 - 12/03 0700 In: 260 [P.O.:260] Out: -  Intake/Output this shift: No intake/output data recorded.  Recent Labs    08/13/17 0533 08/14/17 0534 08/15/17 0527  HGB 10.7* 9.7* 9.4*   Recent Labs    08/14/17 0534 08/15/17 0527  WBC 13.5* 10.8*  RBC 3.53* 3.41*  HCT 29.4* 28.2*  PLT 295 260   Recent Labs    08/13/17 0533  NA 136  K 3.8  CL 103  CO2 27  BUN 13  CREATININE 0.69  GLUCOSE 167*  CALCIUM 8.6*   No results for input(s): LABPT, INR in the last 72 hours.  Well nourished. Alert and oriented x3. RRR, Lungs clear, BS x4. Abdomen soft and non tender. Right Calf soft and non tender. Right knee dressing C/D/I. No DVT signs. Compartment soft. No signs of infection.  Right LE grossly neurovascular intact.  Assessment/Plan: 3 Days Post-Op Procedure(s) (LRB): RIGHT TOTAL KNEE ARTHROPLASTY (Right) Up with PT D/c to [place] today F/u in office in 2 weeks Follow instructions   Janeil Schexnayder L 08/15/2017, 7:42 AM

## 2017-11-15 ENCOUNTER — Ambulatory Visit: Payer: Self-pay | Admitting: Orthopedic Surgery

## 2017-12-14 NOTE — H&P (Signed)
TOTAL KNEE ADMISSION H&P  Patient is being admitted for left total knee arthroplasty.  Subjective:  Chief Complaint:left knee pain.  HPI: Judy Morrison, 67 y.o. female, has a history of pain and functional disability in the left knee due to arthritis and has failed non-surgical conservative treatments for greater than 12 weeks to includecorticosteriod injections, viscosupplementation injections, weight reduction as appropriate and activity modification.  Onset of symptoms was gradual, starting 2 years ago with gradually worsening course since that time. The patient noted no past surgery on the left knee(s).  Patient currently rates pain in the left knee(s) at 7 out of 10 with activity. Patient has worsening of pain with activity and weight bearing, pain that interferes with activities of daily living and pain with passive range of motion.  Patient has evidence of subchondral sclerosis, periarticular osteophytes and joint space narrowing by imaging studies. There is no active infection.  Patient Active Problem List   Diagnosis Date Noted  . S/P knee replacement 08/12/2017  . Incomplete tear of right rotator cuff 05/19/2017  . Osteopenia 05/19/2017  . Urge incontinence of urine 05/19/2017  . Primary open angle glaucoma (POAG) 12/30/2016  . Primary osteoarthritis of both knees 12/30/2016  . Recurrent oral herpes simplex infection 12/30/2016  . Tubular adenoma of colon 12/30/2016  . Hypothyroidism 06/25/2016  . Right foot pain 12/19/2014  . Anxiety 05/21/2011  . Seasonal allergies 05/21/2011   Past Medical History:  Diagnosis Date  . Depressed   . GERD (gastroesophageal reflux disease)    controlled with Omeprazole  . Glaucoma   . Urinary tract infection    dx 05-26-17 took 1 week cipro BID completed 06-02-17.    Past Surgical History:  Procedure Laterality Date  . APPENDECTOMY    . arthroscopic right knee     . CHOLECYSTECTOMY    . COLONOSCOPY    . Pelvic sling     x2  .  POLYPECTOMY    . TONSILLECTOMY    . TOTAL KNEE ARTHROPLASTY Right 08/12/2017   Procedure: RIGHT TOTAL KNEE ARTHROPLASTY;  Surgeon: Sydnee Cabal, MD;  Location: [facility];  Service: Orthopedics;  Laterality: Right;  . UPPER GASTROINTESTINAL ENDOSCOPY    . WISDOM TOOTH EXTRACTION      No current facility-administered medications for this encounter.    Current Outpatient Medications  Medication Sig Dispense Refill Last Dose  . acetaminophen (TYLENOL) 500 MG tablet Take 1,000 mg every 6 (six) hours as needed by mouth for moderate pain or headache.   Past Week at Unknown time  . Adapalene 0.3 % gel Apply 1 application daily as needed topically (for acne).     Marland Kitchen aspirin EC 325 MG tablet Take 1 tablet (325 mg total) by mouth 2 (two) times daily. 60 tablet 0   . baclofen (LIORESAL) 10 MG tablet Take 1 tablet (10 mg total) by mouth 3 (three) times daily as needed for muscle spasms. 50 tablet 1   . Brexpiprazole (REXULTI) 1 MG TABS Take 1 mg daily by mouth.    08/12/2017 at Unknown time  . Cholecalciferol (VITAMIN D3) 1000 units CAPS Take 1,000 Units at bedtime by mouth.   08/11/2017 at Unknown time  . clonazePAM (KLONOPIN) 0.5 MG tablet Take 0.25 mg 2 (two) times daily by mouth.    08/12/2017 at Unknown time  . HYDROmorphone (DILAUDID) 2 MG tablet Take 1 tablet (2 mg total) by mouth every 4 (four) hours as needed for severe pain. 40 tablet 0   . ibuprofen (  ADVIL,MOTRIN) 200 MG tablet Take 400 mg every 6 (six) hours as needed by mouth for headache or moderate pain.    Past Month at Unknown time  . latanoprost (XALATAN) 0.005 % ophthalmic solution Place 1 drop into both eyes at bedtime.   08/11/2017 at Unknown time  . levothyroxine (SYNTHROID, LEVOTHROID) 50 MCG tablet Take 50 mcg at bedtime by mouth.    08/11/2017 at Unknown time  . nortriptyline (PAMELOR) 50 MG capsule Take 100 mg at bedtime by mouth.    08/11/2017 at Unknown time  . omeprazole (PRILOSEC) 40 MG capsule Take 40 mg by mouth daily    08/12/2017 at Unknown time   Allergies  Allergen Reactions  . Codeine Nausea Only  . Erythromycin Itching  . Penicillins Itching, Swelling and Other (See Comments)    Has patient had a PCN reaction causing immediate rash, facial/tongue/throat swelling, SOB or lightheadedness with hypotension: Yes Has patient had a PCN reaction causing severe rash involving mucus membranes or skin necrosis: No Has patient had a PCN reaction that required hospitalization: No Has patient had a PCN reaction occurring within the last 10 years: No If all of the above answers are "NO", then may proceed with Cephalosporin use.     Social History   Tobacco Use  . Smoking status: Former Smoker    Types: Cigarettes    Last attempt to quit: 1986    Years since quitting: 33.2  . Smokeless tobacco: Never Used  Substance Use Topics  . Alcohol use: Yes    Comment: 3-4 beers monthly    Family History  Problem Relation Age of Onset  . Colon cancer Neg Hx   . Breast cancer Neg Hx   . Esophageal cancer Neg Hx   . Pancreatic cancer Neg Hx   . Prostate cancer Neg Hx   . Rectal cancer Neg Hx   . Stomach cancer Neg Hx      Review of Systems  Constitutional: Negative.   HENT: Negative.   Eyes: Negative.   Respiratory: Negative.   Cardiovascular: Negative.   Gastrointestinal: Negative.   Genitourinary: Negative.   Musculoskeletal: Positive for joint pain.  Skin: Negative.   Neurological: Negative.   Psychiatric/Behavioral: Negative.     Objective:  Physical Exam  Constitutional: She is oriented to person, place, and time. She appears well-developed.  HENT:  Head: Normocephalic.  Eyes: EOM are normal.  Neck: Normal range of motion.  Cardiovascular: Normal rate and intact distal pulses.  Respiratory: Effort normal.  GI: Soft.  Genitourinary:  Genitourinary Comments: Deferred  Neurological: She is alert and oriented to person, place, and time.  Skin: Skin is warm and dry.  Psychiatric: Her  behavior is normal.    Vital signs in last 24 hours: BP: ()/()  Arterial Line BP: ()/()   Labs:   Estimated body mass index is 25.13 kg/m as calculated from the following:   Height as of 08/12/17: 5\' 5"  (1.651 m).   Weight as of 08/12/17: 68.5 kg (151 lb).   Imaging Review Plain radiographs demonstrate severe degenerative joint disease of the left knee(s). The overall alignment ismild varus. The bone quality appears to be good for age and reported activity level.   Preoperative templating of the joint replacement has been completed, documented, and submitted to the Operating Room personnel in order to optimize intra-operative equipment management.   Anticipated LOS equal to or greater than 2 midnights due to - Age 3 and older with one or more of the  following:  - Obesity  - Expected need for hospital services (PT, OT, Nursing) required for safe  discharge  - Anticipated need for postoperative skilled nursing care or inpatient rehab  - Active co-morbidities: None OR   - Unanticipated findings during/Post Surgery: None  - Patient is a high risk of re-admission due to: None     Assessment/Plan:  End stage arthritis, left knee   The patient history, physical examination, clinical judgment of the provider and imaging studies are consistent with end stage degenerative joint disease of the left knee(s) and total knee arthroplasty is deemed medically necessary. The treatment options including medical management, injection therapy arthroscopy and arthroplasty were discussed at length. The risks and benefits of total knee arthroplasty were presented and reviewed. The risks due to aseptic loosening, infection, stiffness, patella tracking problems, thromboembolic complications and other imponderables were discussed. The patient acknowledged the explanation, agreed to proceed with the plan and consent was signed. Patient is being admitted for inpatient treatment for surgery, pain  control, PT, OT, prophylactic antibiotics, VTE prophylaxis, progressive ambulation and ADL's and discharge planning. The patient is planning to be discharged rehab vs HHPT. Will use IV tranexamic acid. Contraindications and adverse affects of Tranexamic acid discussed in detail. Patient denies any of these at this time and understands the risks and benefits.

## 2017-12-26 ENCOUNTER — Other Ambulatory Visit (HOSPITAL_COMMUNITY): Payer: Self-pay | Admitting: Emergency Medicine

## 2017-12-26 NOTE — Patient Instructions (Addendum)
Judy Morrison  12/26/2017   Your procedure is scheduled on: 01-06-18  Report to [facility] Main  Entrance    Report to admitting at 10:45AM    Call this number if you have problems the morning of surgery [phone]     Remember: Do not eat food After Midnight. YOU  MAY HAVE CLEAR LIQUIDS FROM MIDNIGHT UNTIL 7:15AM DAY OF SURGERY. NOTHING BY MOUTH AFTER 7:15AM!     Take these medicines the morning of surgery with A SIP OF WATER: REXULTI, CLONAZEPAM, DULOXETINE, LEVOTHYROXINE, EYE DROPS IF NEEDED, OMEPRAZOLE, TYLENOL IF NEEDED                                You may not have any metal on your body including hair pins and              piercings  Do not wear jewelry, make-up, lotions, powders or perfumes, deodorant             Do not wear nail polish.  Do not shave  48 hours prior to surgery.     Do not bring valuables to the hospital. [place].  Contacts, dentures or bridgework may not be worn into surgery.  Leave suitcase in the car. After surgery it may be brought to your room.                Please read over the following fact sheets you were given: _____________________________________________________________________     CLEAR LIQUID DIET   Foods Allowed                                                                     Foods Excluded  Coffee and tea, regular and decaf                             liquids that you cannot  Plain Jell-O in any flavor                                             see through such as: Fruit ices (not with fruit pulp)                                     milk, soups, orange juice  Iced Popsicles                                    All solid food Carbonated beverages, regular and diet                                    Cranberry, grape and  apple juices Sports drinks like Gatorade Lightly seasoned clear broth or consume(fat free) Sugar, honey syrup  Sample  Menu Breakfast                                Lunch                                     Supper Cranberry juice                    Beef broth                            Chicken broth Jell-O                                     Grape juice                           Apple juice Coffee or tea                        Jell-O                                      Popsicle                                                Coffee or tea                        Coffee or tea  _____________________________________________________________________  [facility] - Preparing for Surgery Before surgery, you can play an important role.  Because skin is not sterile, your skin needs to be as free of germs as possible.  You can reduce the number of germs on your skin by washing with CHG (chlorahexidine gluconate) soap before surgery.  CHG is an antiseptic cleaner which kills germs and bonds with the skin to continue killing germs even after washing. Please DO NOT use if you have an allergy to CHG or antibacterial soaps.  If your skin becomes reddened/irritated stop using the CHG and inform your nurse when you arrive at Short Stay. Do not shave (including legs and underarms) for at least 48 hours prior to the first CHG shower.  You may shave your face/neck. Please follow these instructions carefully:  1.  Shower with CHG Soap the night before surgery and the  morning of Surgery.  2.  If you choose to wash your hair, wash your hair first as usual with your  normal  shampoo.  3.  After you shampoo, rinse your hair and body thoroughly to remove the  shampoo.                           4.  Use CHG as you would any other liquid soap.  You can apply chg directly  to the skin and wash  Gently with a scrungie or clean washcloth.  5.  Apply the CHG Soap to your body ONLY FROM THE NECK DOWN.   Do not use on face/ open                           Wound or open sores. Avoid contact with eyes, ears mouth and genitals  (private parts).                       Wash face,  Genitals (private parts) with your normal soap.             6.  Wash thoroughly, paying special attention to the area where your surgery  will be performed.  7.  Thoroughly rinse your body with warm water from the neck down.  8.  DO NOT shower/wash with your normal soap after using and rinsing off  the CHG Soap.                9.  Pat yourself dry with a clean towel.            10.  Wear clean pajamas.            11.  Place clean sheets on your bed the night of your first shower and do not  sleep with pets. Day of Surgery : Do not apply any lotions/deodorants the morning of surgery.  Please wear clean clothes to the hospital/surgery center.  FAILURE TO FOLLOW THESE INSTRUCTIONS MAY RESULT IN THE CANCELLATION OF YOUR SURGERY PATIENT SIGNATURE_________________________________  NURSE SIGNATURE__________________________________  ________________________________________________________________________   Judy Morrison  An incentive spirometer is a tool that can help keep your lungs clear and active. This tool measures how well you are filling your lungs with each breath. Taking long deep breaths may help reverse or decrease the chance of developing breathing (pulmonary) problems (especially infection) following:  A long period of time when you are unable to move or be active. BEFORE THE PROCEDURE   If the spirometer includes an indicator to show your best effort, your nurse or respiratory therapist will set it to a desired goal.  If possible, sit up straight or lean slightly forward. Try not to slouch.  Hold the incentive spirometer in an upright position. INSTRUCTIONS FOR USE  1. Sit on the edge of your bed if possible, or sit up as far as you can in bed or on a chair. 2. Hold the incentive spirometer in an upright position. 3. Breathe out normally. 4. Place the mouthpiece in your mouth and seal your lips tightly around  it. 5. Breathe in slowly and as deeply as possible, raising the piston or the ball toward the top of the column. 6. Hold your breath for 3-5 seconds or for as long as possible. Allow the piston or ball to fall to the bottom of the column. 7. Remove the mouthpiece from your mouth and breathe out normally. 8. Rest for a few seconds and repeat Steps 1 through 7 at least 10 times every 1-2 hours when you are awake. Take your time and take a few normal breaths between deep breaths. 9. The spirometer may include an indicator to show your best effort. Use the indicator as a goal to work toward during each repetition. 10. After each set of 10 deep breaths, practice coughing to be sure your lungs are clear. If you have an incision (the cut made at the time of  surgery), support your incision when coughing by placing a pillow or rolled up towels firmly against it. Once you are able to get out of bed, walk around indoors and cough well. You may stop using the incentive spirometer when instructed by your caregiver.  RISKS AND COMPLICATIONS  Take your time so you do not get dizzy or light-headed.  If you are in pain, you may need to take or ask for pain medication before doing incentive spirometry. It is harder to take a deep breath if you are having pain. AFTER USE  Rest and breathe slowly and easily.  It can be helpful to keep track of a log of your progress. Your caregiver can provide you with a simple table to help with this. If you are using the spirometer at home, follow these instructions: [place] IF:   You are having difficultly using the spirometer.  You have trouble using the spirometer as often as instructed.  Your pain medication is not giving enough relief while using the spirometer.  You develop fever of 100.5 F (38.1 C) or higher. SEEK IMMEDIATE MEDICAL CARE IF:   You cough up bloody sputum that had not been present before.  You develop fever of 102 F (38.9 C) or  greater.  You develop worsening pain at or near the incision site. MAKE SURE YOU:   Understand these instructions.  Will watch your condition.  Will get help right away if you are not doing well or get worse. Document Released: 01/10/2007 Document Revised: 11/22/2011 Document Reviewed: 03/13/2007 [facility] Patient Information 2014 [place], [place].   ________________________________________________________________________

## 2017-12-27 ENCOUNTER — Other Ambulatory Visit: Payer: Self-pay

## 2017-12-27 ENCOUNTER — Encounter (HOSPITAL_COMMUNITY)
Admission: RE | Admit: 2017-12-27 | Discharge: 2017-12-27 | Disposition: A | Discharge status: Home / Self Care | Payer: Medicare Other | Source: Ambulatory Visit | Attending: Specialist | Admitting: Specialist

## 2017-12-27 ENCOUNTER — Encounter (HOSPITAL_COMMUNITY): Payer: Self-pay

## 2017-12-27 DIAGNOSIS — K219 Gastro-esophageal reflux disease without esophagitis: Secondary | ICD-10-CM | POA: Insufficient documentation

## 2017-12-27 DIAGNOSIS — M1712 Unilateral primary osteoarthritis, left knee: Secondary | ICD-10-CM | POA: Diagnosis not present

## 2017-12-27 DIAGNOSIS — F329 Major depressive disorder, single episode, unspecified: Secondary | ICD-10-CM | POA: Diagnosis not present

## 2017-12-27 DIAGNOSIS — D649 Anemia, unspecified: Secondary | ICD-10-CM | POA: Diagnosis not present

## 2017-12-27 DIAGNOSIS — Z79899 Other long term (current) drug therapy: Secondary | ICD-10-CM | POA: Insufficient documentation

## 2017-12-27 DIAGNOSIS — Z87440 Personal history of urinary (tract) infections: Secondary | ICD-10-CM | POA: Diagnosis not present

## 2017-12-27 DIAGNOSIS — Z01812 Encounter for preprocedural laboratory examination: Secondary | ICD-10-CM | POA: Insufficient documentation

## 2017-12-27 HISTORY — DX: Unspecified osteoarthritis, unspecified site: M19.90

## 2017-12-27 HISTORY — DX: Unspecified rotator cuff tear or rupture of unspecified shoulder, not specified as traumatic: M75.100

## 2017-12-27 HISTORY — DX: Anemia, unspecified: D64.9

## 2017-12-27 LAB — URINALYSIS, ROUTINE W REFLEX MICROSCOPIC
Bacteria, UA: NONE SEEN
Bilirubin Urine: NEGATIVE
GLUCOSE, UA: NEGATIVE mg/dL
KETONES UR: NEGATIVE mg/dL
Leukocytes, UA: NEGATIVE
Nitrite: NEGATIVE
PH: 6 (ref 5.0–8.0)
Protein, ur: NEGATIVE mg/dL
Specific Gravity, Urine: 1.014 (ref 1.005–1.030)

## 2017-12-27 LAB — BASIC METABOLIC PANEL
ANION GAP: 8 (ref 5–15)
BUN: 19 mg/dL (ref 6–20)
CHLORIDE: 102 mmol/L (ref 101–111)
CO2: 26 mmol/L (ref 22–32)
Calcium: 9.3 mg/dL (ref 8.9–10.3)
Creatinine, Ser: 0.8 mg/dL (ref 0.44–1.00)
Glucose, Bld: 79 mg/dL (ref 65–99)
POTASSIUM: 4.9 mmol/L (ref 3.5–5.1)
SODIUM: 136 mmol/L (ref 135–145)

## 2017-12-27 LAB — CBC
HEMATOCRIT: 35.5 % — AB (ref 36.0–46.0)
HEMOGLOBIN: 11.5 g/dL — AB (ref 12.0–15.0)
MCH: 25.6 pg — ABNORMAL LOW (ref 26.0–34.0)
MCHC: 32.4 g/dL (ref 30.0–36.0)
MCV: 79.1 fL (ref 78.0–100.0)
Platelets: 381 10*3/uL (ref 150–400)
RBC: 4.49 MIL/uL (ref 3.87–5.11)
RDW: 14.8 % (ref 11.5–15.5)
WBC: 6.1 10*3/uL (ref 4.0–10.5)

## 2017-12-27 LAB — PROTIME-INR
INR: 1.01
Prothrombin Time: 13.2 seconds (ref 11.4–15.2)

## 2017-12-27 LAB — SURGICAL PCR SCREEN
MRSA, PCR: NEGATIVE
STAPHYLOCOCCUS AUREUS: NEGATIVE

## 2017-12-27 LAB — APTT: APTT: 32 s (ref 24–36)

## 2018-01-05 MED ORDER — SODIUM CHLORIDE 0.9 % IV SOLN
1000.0000 mg | INTRAVENOUS | Status: AC
  Administered 2018-01-06: 1000 mg via INTRAVENOUS
  Filled 2018-01-05: qty 1100

## 2018-01-06 ENCOUNTER — Inpatient Hospital Stay (HOSPITAL_COMMUNITY): Payer: Medicare Other | Admitting: Anesthesiology

## 2018-01-06 ENCOUNTER — Inpatient Hospital Stay (HOSPITAL_COMMUNITY)
Admission: RE | Admit: 2018-01-06 | Discharge: 2018-01-10 | DRG: 470 | Disposition: A | Discharge status: Skilled Nursing Facility | Payer: Medicare Other | Source: Ambulatory Visit | Attending: Specialist | Admitting: Specialist

## 2018-01-06 ENCOUNTER — Encounter (HOSPITAL_COMMUNITY): Payer: Self-pay | Admitting: *Deleted

## 2018-01-06 ENCOUNTER — Other Ambulatory Visit: Payer: Self-pay

## 2018-01-06 ENCOUNTER — Encounter (HOSPITAL_COMMUNITY)
Admission: RE | Disposition: A | Discharge status: Skilled Nursing Facility | Payer: Self-pay | Source: Ambulatory Visit | Attending: Specialist

## 2018-01-06 DIAGNOSIS — M1712 Unilateral primary osteoarthritis, left knee: Secondary | ICD-10-CM

## 2018-01-06 DIAGNOSIS — Z79899 Other long term (current) drug therapy: Secondary | ICD-10-CM

## 2018-01-06 DIAGNOSIS — Z7982 Long term (current) use of aspirin: Secondary | ICD-10-CM | POA: Diagnosis not present

## 2018-01-06 DIAGNOSIS — D649 Anemia, unspecified: Secondary | ICD-10-CM | POA: Diagnosis present

## 2018-01-06 DIAGNOSIS — K219 Gastro-esophageal reflux disease without esophagitis: Secondary | ICD-10-CM | POA: Diagnosis present

## 2018-01-06 DIAGNOSIS — H401190 Primary open-angle glaucoma, unspecified eye, stage unspecified: Secondary | ICD-10-CM | POA: Diagnosis present

## 2018-01-06 DIAGNOSIS — Z87891 Personal history of nicotine dependence: Secondary | ICD-10-CM

## 2018-01-06 DIAGNOSIS — M25762 Osteophyte, left knee: Secondary | ICD-10-CM | POA: Diagnosis present

## 2018-01-06 DIAGNOSIS — Z96651 Presence of right artificial knee joint: Secondary | ICD-10-CM | POA: Diagnosis present

## 2018-01-06 DIAGNOSIS — Z791 Long term (current) use of non-steroidal anti-inflammatories (NSAID): Secondary | ICD-10-CM

## 2018-01-06 DIAGNOSIS — E039 Hypothyroidism, unspecified: Secondary | ICD-10-CM | POA: Diagnosis present

## 2018-01-06 DIAGNOSIS — Z9049 Acquired absence of other specified parts of digestive tract: Secondary | ICD-10-CM

## 2018-01-06 DIAGNOSIS — Z79890 Hormone replacement therapy (postmenopausal): Secondary | ICD-10-CM | POA: Diagnosis not present

## 2018-01-06 DIAGNOSIS — F419 Anxiety disorder, unspecified: Secondary | ICD-10-CM | POA: Diagnosis present

## 2018-01-06 DIAGNOSIS — Z96659 Presence of unspecified artificial knee joint: Secondary | ICD-10-CM

## 2018-01-06 DIAGNOSIS — Z880 Allergy status to penicillin: Secondary | ICD-10-CM | POA: Diagnosis not present

## 2018-01-06 DIAGNOSIS — Z881 Allergy status to other antibiotic agents status: Secondary | ICD-10-CM

## 2018-01-06 DIAGNOSIS — Z885 Allergy status to narcotic agent status: Secondary | ICD-10-CM | POA: Diagnosis not present

## 2018-01-06 DIAGNOSIS — F329 Major depressive disorder, single episode, unspecified: Secondary | ICD-10-CM | POA: Diagnosis present

## 2018-01-06 DIAGNOSIS — M8580 Other specified disorders of bone density and structure, unspecified site: Secondary | ICD-10-CM | POA: Diagnosis present

## 2018-01-06 HISTORY — PX: TOTAL KNEE ARTHROPLASTY: SHX125

## 2018-01-06 HISTORY — DX: Unilateral primary osteoarthritis, left knee: M17.12

## 2018-01-06 LAB — TYPE AND SCREEN
ABO/RH(D): A POS
ANTIBODY SCREEN: NEGATIVE

## 2018-01-06 SURGERY — ARTHROPLASTY, KNEE, TOTAL
Anesthesia: Spinal | Site: Knee | Laterality: Left

## 2018-01-06 MED ORDER — BISACODYL 5 MG PO TBEC: 5.0000 mg | DELAYED_RELEASE_TABLET | Freq: Every day | ORAL | Status: DC | PRN

## 2018-01-06 MED ORDER — LORATADINE 10 MG PO TABS
10.0000 mg | ORAL_TABLET | Freq: Every day | ORAL | Status: DC
  Administered 2018-01-07 – 2018-01-10 (×4): 10 mg via ORAL
  Filled 2018-01-06 (×4): qty 1

## 2018-01-06 MED ORDER — CHLORHEXIDINE GLUCONATE 4 % EX LIQD: 60.0000 mL | Freq: Once | CUTANEOUS | Status: DC

## 2018-01-06 MED ORDER — BACLOFEN 10 MG PO TABS: 10.0000 mg | ORAL_TABLET | Freq: Every day | ORAL | 1 refills | Status: AC

## 2018-01-06 MED ORDER — FENTANYL CITRATE (PF) 100 MCG/2ML IJ SOLN: 25.0000 ug | INTRAMUSCULAR | Status: DC | PRN

## 2018-01-06 MED ORDER — PROPOFOL 10 MG/ML IV BOLUS
INTRAVENOUS | Status: AC
  Filled 2018-01-06: qty 60

## 2018-01-06 MED ORDER — ENOXAPARIN SODIUM 30 MG/0.3ML ~~LOC~~ SOLN
30.0000 mg | Freq: Two times a day (BID) | SUBCUTANEOUS | Status: DC
  Administered 2018-01-07 – 2018-01-10 (×7): 30 mg via SUBCUTANEOUS
  Filled 2018-01-06 (×7): qty 0.3

## 2018-01-06 MED ORDER — BREXPIPRAZOLE 1 MG PO TABS
1.0000 mg | ORAL_TABLET | Freq: Every day | ORAL | Status: DC
  Administered 2018-01-07 – 2018-01-10 (×4): 1 mg via ORAL
  Filled 2018-01-06 (×4): qty 1

## 2018-01-06 MED ORDER — SODIUM CHLORIDE 0.9 % IJ SOLN
INTRAMUSCULAR | Status: AC
  Filled 2018-01-06: qty 50

## 2018-01-06 MED ORDER — LACTATED RINGERS IV SOLN
INTRAVENOUS | Status: DC
  Administered 2018-01-06 (×2): via INTRAVENOUS

## 2018-01-06 MED ORDER — ONDANSETRON HCL 4 MG/2ML IJ SOLN
INTRAMUSCULAR | Status: AC
  Filled 2018-01-06: qty 2

## 2018-01-06 MED ORDER — PROPOFOL 500 MG/50ML IV EMUL
INTRAVENOUS | Status: DC | PRN
  Administered 2018-01-06: 50 ug/kg/min via INTRAVENOUS

## 2018-01-06 MED ORDER — ONDANSETRON HCL 4 MG/2ML IJ SOLN: 4.0000 mg | Freq: Once | INTRAMUSCULAR | Status: DC | PRN

## 2018-01-06 MED ORDER — MIDAZOLAM HCL 2 MG/2ML IJ SOLN
INTRAMUSCULAR | Status: AC
  Filled 2018-01-06: qty 2

## 2018-01-06 MED ORDER — METHOCARBAMOL 1000 MG/10ML IJ SOLN
500.0000 mg | Freq: Four times a day (QID) | INTRAVENOUS | Status: DC | PRN
  Filled 2018-01-06: qty 5

## 2018-01-06 MED ORDER — NORTRIPTYLINE HCL 25 MG PO CAPS
100.0000 mg | ORAL_CAPSULE | Freq: Every day | ORAL | Status: DC
  Administered 2018-01-06 – 2018-01-09 (×4): 100 mg via ORAL
  Filled 2018-01-06 (×5): qty 4

## 2018-01-06 MED ORDER — PHENOL 1.4 % MT LIQD
1.0000 | OROMUCOSAL | Status: DC | PRN
Start: 2018-01-06 — End: 2018-01-10

## 2018-01-06 MED ORDER — ASPIRIN EC 325 MG PO TBEC: 325.0000 mg | DELAYED_RELEASE_TABLET | Freq: Two times a day (BID) | ORAL | 0 refills | Status: AC

## 2018-01-06 MED ORDER — KETOROLAC TROMETHAMINE 30 MG/ML IJ SOLN
INTRAMUSCULAR | Status: AC
  Filled 2018-01-06: qty 1

## 2018-01-06 MED ORDER — FERROUS SULFATE 325 (65 FE) MG PO TABS
325.0000 mg | ORAL_TABLET | Freq: Three times a day (TID) | ORAL | Status: DC
  Administered 2018-01-06 – 2018-01-10 (×11): 325 mg via ORAL
  Filled 2018-01-06 (×11): qty 1

## 2018-01-06 MED ORDER — KETOROLAC TROMETHAMINE 30 MG/ML IJ SOLN
INTRAMUSCULAR | Status: DC | PRN
  Administered 2018-01-06: 30 mg via INTRA_ARTICULAR

## 2018-01-06 MED ORDER — METHOCARBAMOL 500 MG PO TABS
500.0000 mg | ORAL_TABLET | Freq: Four times a day (QID) | ORAL | Status: DC | PRN
  Administered 2018-01-06 – 2018-01-10 (×9): 500 mg via ORAL
  Filled 2018-01-06 (×9): qty 1

## 2018-01-06 MED ORDER — HYDROMORPHONE HCL 1 MG/ML IJ SOLN: 0.5000 mg | INTRAMUSCULAR | Status: DC | PRN

## 2018-01-06 MED ORDER — BUPIVACAINE HCL (PF) 0.25 % IJ SOLN
INTRAMUSCULAR | Status: DC | PRN
  Administered 2018-01-06: 30 mL via INTRA_ARTICULAR

## 2018-01-06 MED ORDER — PROPOFOL 10 MG/ML IV BOLUS
INTRAVENOUS | Status: DC | PRN
  Administered 2018-01-06: 10 mg via INTRAVENOUS
  Administered 2018-01-06 (×2): 20 mg via INTRAVENOUS
  Administered 2018-01-06: 10 mg via INTRAVENOUS

## 2018-01-06 MED ORDER — DEXAMETHASONE SODIUM PHOSPHATE 10 MG/ML IJ SOLN
INTRAMUSCULAR | Status: AC
  Filled 2018-01-06: qty 1

## 2018-01-06 MED ORDER — ROPIVACAINE HCL 7.5 MG/ML IJ SOLN
INTRAMUSCULAR | Status: DC | PRN
  Administered 2018-01-06: 20 mL via PERINEURAL

## 2018-01-06 MED ORDER — FENTANYL CITRATE (PF) 100 MCG/2ML IJ SOLN
INTRAMUSCULAR | Status: AC
  Filled 2018-01-06: qty 2

## 2018-01-06 MED ORDER — BUPIVACAINE HCL (PF) 0.25 % IJ SOLN
INTRAMUSCULAR | Status: AC
  Filled 2018-01-06: qty 30

## 2018-01-06 MED ORDER — ACETAMINOPHEN 500 MG PO TABS
1000.0000 mg | ORAL_TABLET | Freq: Four times a day (QID) | ORAL | Status: AC
  Administered 2018-01-06 – 2018-01-07 (×4): 1000 mg via ORAL
  Filled 2018-01-06 (×4): qty 2

## 2018-01-06 MED ORDER — ALUM & MAG HYDROXIDE-SIMETH 200-200-20 MG/5ML PO SUSP: 30.0000 mL | ORAL | Status: DC | PRN

## 2018-01-06 MED ORDER — VANCOMYCIN HCL IN DEXTROSE 1-5 GM/200ML-% IV SOLN
1000.0000 mg | INTRAVENOUS | Status: AC
  Administered 2018-01-06: 1000 mg via INTRAVENOUS
  Filled 2018-01-06: qty 200

## 2018-01-06 MED ORDER — DOCUSATE SODIUM 100 MG PO CAPS
100.0000 mg | ORAL_CAPSULE | Freq: Two times a day (BID) | ORAL | Status: DC
  Administered 2018-01-06 – 2018-01-10 (×8): 100 mg via ORAL
  Filled 2018-01-06 (×8): qty 1

## 2018-01-06 MED ORDER — ACETAMINOPHEN 325 MG PO TABS
325.0000 mg | ORAL_TABLET | Freq: Four times a day (QID) | ORAL | Status: DC | PRN
  Administered 2018-01-09 – 2018-01-10 (×2): 650 mg via ORAL
  Filled 2018-01-06 (×2): qty 2

## 2018-01-06 MED ORDER — ONDANSETRON HCL 4 MG/2ML IJ SOLN: 4.0000 mg | Freq: Four times a day (QID) | INTRAMUSCULAR | Status: DC | PRN

## 2018-01-06 MED ORDER — CLONAZEPAM 0.5 MG PO TABS
0.2500 mg | ORAL_TABLET | Freq: Two times a day (BID) | ORAL | Status: DC
  Administered 2018-01-06 – 2018-01-10 (×8): 0.25 mg via ORAL
  Filled 2018-01-06 (×8): qty 1

## 2018-01-06 MED ORDER — CLONIDINE HCL (ANALGESIA) 100 MCG/ML EP SOLN
EPIDURAL | Status: DC | PRN
  Administered 2018-01-06: 50 ug

## 2018-01-06 MED ORDER — ONDANSETRON HCL 4 MG/2ML IJ SOLN
INTRAMUSCULAR | Status: DC | PRN
  Administered 2018-01-06: 4 mg via INTRAVENOUS

## 2018-01-06 MED ORDER — DEXAMETHASONE SODIUM PHOSPHATE 10 MG/ML IJ SOLN
10.0000 mg | Freq: Once | INTRAMUSCULAR | Status: AC
  Administered 2018-01-06: 10 mg via INTRAVENOUS

## 2018-01-06 MED ORDER — BUPIVACAINE IN DEXTROSE 0.75-8.25 % IT SOLN
INTRATHECAL | Status: DC | PRN
  Administered 2018-01-06: 1.8 mL via INTRATHECAL

## 2018-01-06 MED ORDER — SODIUM CHLORIDE 0.9 % IV SOLN
INTRAVENOUS | Status: DC
  Administered 2018-01-06: 1000 mL via INTRAVENOUS

## 2018-01-06 MED ORDER — VANCOMYCIN HCL IN DEXTROSE 1-5 GM/200ML-% IV SOLN
1000.0000 mg | Freq: Two times a day (BID) | INTRAVENOUS | Status: AC
  Administered 2018-01-06: 1000 mg via INTRAVENOUS
  Filled 2018-01-06: qty 200

## 2018-01-06 MED ORDER — LATANOPROST 0.005 % OP SOLN
1.0000 [drp] | Freq: Every day | OPHTHALMIC | Status: DC
  Administered 2018-01-06 – 2018-01-09 (×4): 1 [drp] via OPHTHALMIC
  Filled 2018-01-06: qty 2.5

## 2018-01-06 MED ORDER — SODIUM CHLORIDE 0.9 % IR SOLN
Status: DC | PRN
  Administered 2018-01-06: 1000 mL

## 2018-01-06 MED ORDER — PANTOPRAZOLE SODIUM 40 MG PO TBEC
40.0000 mg | DELAYED_RELEASE_TABLET | Freq: Every day | ORAL | Status: DC
  Administered 2018-01-07 – 2018-01-10 (×4): 40 mg via ORAL
  Filled 2018-01-06 (×4): qty 1

## 2018-01-06 MED ORDER — MAGNESIUM CITRATE PO SOLN: 1.0000 | Freq: Once | ORAL | Status: DC | PRN

## 2018-01-06 MED ORDER — HYDROMORPHONE HCL 2 MG PO TABS
2.0000 mg | ORAL_TABLET | ORAL | Status: DC | PRN
  Administered 2018-01-06 – 2018-01-09 (×14): 2 mg via ORAL
  Filled 2018-01-06 (×17): qty 1

## 2018-01-06 MED ORDER — DULOXETINE HCL 20 MG PO CPEP
20.0000 mg | ORAL_CAPSULE | Freq: Every day | ORAL | Status: DC
  Administered 2018-01-07 – 2018-01-10 (×4): 20 mg via ORAL
  Filled 2018-01-06 (×4): qty 1

## 2018-01-06 MED ORDER — LEVOTHYROXINE SODIUM 50 MCG PO TABS
50.0000 ug | ORAL_TABLET | Freq: Every day | ORAL | Status: DC
  Administered 2018-01-06 – 2018-01-09 (×4): 50 ug via ORAL
  Filled 2018-01-06 (×4): qty 1

## 2018-01-06 MED ORDER — FENTANYL CITRATE (PF) 100 MCG/2ML IJ SOLN
50.0000 ug | INTRAMUSCULAR | Status: DC
  Administered 2018-01-06: 50 ug via INTRAVENOUS
  Filled 2018-01-06: qty 2

## 2018-01-06 MED ORDER — MIDAZOLAM HCL 2 MG/2ML IJ SOLN
1.0000 mg | INTRAMUSCULAR | Status: DC
  Administered 2018-01-06: 2 mg via INTRAVENOUS
  Filled 2018-01-06: qty 2

## 2018-01-06 MED ORDER — PROPOFOL 10 MG/ML IV BOLUS
INTRAVENOUS | Status: AC
Start: 2018-01-06 — End: 2018-01-06
  Filled 2018-01-06: qty 60

## 2018-01-06 MED ORDER — HYDROMORPHONE HCL 2 MG PO TABS: 2.0000 mg | ORAL_TABLET | ORAL | 0 refills | Status: AC | PRN

## 2018-01-06 MED ORDER — ONDANSETRON HCL 4 MG PO TABS: 4.0000 mg | ORAL_TABLET | Freq: Four times a day (QID) | ORAL | Status: DC | PRN

## 2018-01-06 MED ORDER — POLYETHYLENE GLYCOL 3350 17 G PO PACK
17.0000 g | PACK | Freq: Every day | ORAL | Status: DC | PRN
  Filled 2018-01-06: qty 1

## 2018-01-06 MED ORDER — MENTHOL 3 MG MT LOZG: 1.0000 | LOZENGE | OROMUCOSAL | Status: DC | PRN

## 2018-01-06 MED ORDER — DEXAMETHASONE SODIUM PHOSPHATE 10 MG/ML IJ SOLN
10.0000 mg | Freq: Once | INTRAMUSCULAR | Status: AC
  Administered 2018-01-07: 10 mg via INTRAVENOUS
  Filled 2018-01-06: qty 1

## 2018-01-06 MED ORDER — METOCLOPRAMIDE HCL 5 MG PO TABS: 5.0000 mg | ORAL_TABLET | Freq: Three times a day (TID) | ORAL | Status: DC | PRN

## 2018-01-06 MED ORDER — METOCLOPRAMIDE HCL 5 MG/ML IJ SOLN: 5.0000 mg | Freq: Three times a day (TID) | INTRAMUSCULAR | Status: DC | PRN

## 2018-01-06 MED ORDER — DIPHENHYDRAMINE HCL 12.5 MG/5ML PO ELIX: 12.5000 mg | ORAL_SOLUTION | ORAL | Status: DC | PRN

## 2018-01-06 MED ORDER — SODIUM CHLORIDE 0.9 % IJ SOLN
INTRAMUSCULAR | Status: DC | PRN
  Administered 2018-01-06: 30 mL

## 2018-01-06 MED ORDER — PROPOFOL 10 MG/ML IV BOLUS
INTRAVENOUS | Status: AC
  Filled 2018-01-06: qty 20

## 2018-01-06 SURGICAL SUPPLY — 58 items
BAG DECANTER FOR FLEXI CONT (MISCELLANEOUS) IMPLANT
BAG ZIPLOCK 12X15 (MISCELLANEOUS) IMPLANT
BANDAGE ACE 4X5 VEL STRL LF (GAUZE/BANDAGES/DRESSINGS) ×3 IMPLANT
BANDAGE ACE 6X5 VEL STRL LF (GAUZE/BANDAGES/DRESSINGS) ×3 IMPLANT
BLADE SAG 18X100X1.27 (BLADE) ×3 IMPLANT
BLADE SAW SGTL 13.0X1.19X90.0M (BLADE) ×3 IMPLANT
BOWL SMART MIX CTS (DISPOSABLE) ×3 IMPLANT
CAP KNEE TOTAL 3 SIGMA ×3 IMPLANT
CEMENT HV SMART SET (Cement) ×6 IMPLANT
COVER SURGICAL LIGHT HANDLE (MISCELLANEOUS) ×3 IMPLANT
CUFF TOURN SGL QUICK 34 (TOURNIQUET CUFF) ×2
CUFF TRNQT CYL 34X4X40X1 (TOURNIQUET CUFF) ×1 IMPLANT
DECANTER SPIKE VIAL GLASS SM (MISCELLANEOUS) ×3 IMPLANT
DERMABOND ADVANCED (GAUZE/BANDAGES/DRESSINGS) ×2
DERMABOND ADVANCED .7 DNX12 (GAUZE/BANDAGES/DRESSINGS) ×1 IMPLANT
DRAPE TOP 10253 STERILE (DRAPES) IMPLANT
DRAPE U-SHAPE 47X51 STRL (DRAPES) ×3 IMPLANT
DRSG AQUACEL AG ADV 3.5X10 (GAUZE/BANDAGES/DRESSINGS) ×3 IMPLANT
DRSG TEGADERM 4X4.75 (GAUZE/BANDAGES/DRESSINGS) ×3 IMPLANT
DURAPREP 26ML APPLICATOR (WOUND CARE) ×6 IMPLANT
ELECT REM PT RETURN 15FT ADLT (MISCELLANEOUS) ×3 IMPLANT
EVACUATOR 1/8 PVC DRAIN (DRAIN) ×3 IMPLANT
GAUZE SPONGE 2X2 8PLY STRL LF (GAUZE/BANDAGES/DRESSINGS) ×1 IMPLANT
GLOVE BIO SURGEON STRL SZ7.5 (GLOVE) ×6 IMPLANT
GLOVE BIOGEL PI IND STRL 8 (GLOVE) ×6 IMPLANT
GLOVE BIOGEL PI INDICATOR 8 (GLOVE) ×12
GLOVE ECLIPSE 8.0 STRL XLNG CF (GLOVE) ×6 IMPLANT
GLOVE SURG ORTHO 9.0 STRL STRW (GLOVE) ×3 IMPLANT
GLOVE SURG SS PI 7.5 STRL IVOR (GLOVE) ×3 IMPLANT
GOWN STRL REUS W/TWL XL LVL3 (GOWN DISPOSABLE) ×12 IMPLANT
HANDPIECE INTERPULSE COAX TIP (DISPOSABLE) ×2
IMMOBILIZER KNEE 20 (SOFTGOODS) ×3
IMMOBILIZER KNEE 20 THIGH 36 (SOFTGOODS) ×1 IMPLANT
NS IRRIG 1000ML POUR BTL (IV SOLUTION) ×3 IMPLANT
PACK TOTAL KNEE CUSTOM (KITS) ×3 IMPLANT
POSITIONER SURGICAL ARM (MISCELLANEOUS) ×3 IMPLANT
SET HNDPC FAN SPRY TIP SCT (DISPOSABLE) ×1 IMPLANT
SET PAD KNEE POSITIONER (MISCELLANEOUS) ×3 IMPLANT
SPONGE GAUZE 2X2 STER 10/PKG (GAUZE/BANDAGES/DRESSINGS) ×2
SPONGE LAP 18X18 RF (DISPOSABLE) IMPLANT
SPONGE SURGIFOAM ABS GEL 100 (HEMOSTASIS) ×3 IMPLANT
STOCKINETTE 6  STRL (DRAPES) ×2
STOCKINETTE 6 STRL (DRAPES) ×1 IMPLANT
SUCTION FRAZIER HANDLE 12FR (TUBING) ×2
SUCTION TUBE FRAZIER 12FR DISP (TUBING) ×1 IMPLANT
SUT BONE WAX W31G (SUTURE) IMPLANT
SUT MNCRL AB 3-0 PS2 18 (SUTURE) ×3 IMPLANT
SUT VIC AB 1 CT1 27 (SUTURE) ×8
SUT VIC AB 1 CT1 27XBRD ANTBC (SUTURE) ×4 IMPLANT
SUT VIC AB 2-0 CT1 27 (SUTURE) ×4
SUT VIC AB 2-0 CT1 TAPERPNT 27 (SUTURE) ×2 IMPLANT
SUT VLOC 180 0 24IN GS25 (SUTURE) ×3 IMPLANT
SYR 50ML LL SCALE MARK (SYRINGE) ×3 IMPLANT
TAPE STRIPS DRAPE STRL (GAUZE/BANDAGES/DRESSINGS) ×3 IMPLANT
TRAY FOLEY W/METER SILVER 16FR (SET/KITS/TRAYS/PACK) ×3 IMPLANT
WATER STERILE IRR 1000ML POUR (IV SOLUTION) ×6 IMPLANT
WRAP KNEE MAXI GEL POST OP (GAUZE/BANDAGES/DRESSINGS) ×3 IMPLANT
YANKAUER SUCT BULB TIP 10FT TU (MISCELLANEOUS) ×3 IMPLANT

## 2018-01-06 NOTE — Anesthesia Procedure Notes (Signed)
Spinal  Patient location during procedure: OR Start time: 01/06/2018 2:13 PM End time: 01/06/2018 2:16 PM Staffing Anesthesiologist: Catalina Gravel, MD Resident/CRNA: Montel Clock, CRNA Performed: resident/CRNA  Preanesthetic Checklist Completed: patient identified, surgical consent, pre-op evaluation, timeout performed, IV checked, risks and benefits discussed and monitors and equipment checked Spinal Block Patient position: sitting Prep: DuraPrep Patient monitoring: heart rate, cardiac monitor, continuous pulse ox and blood pressure Approach: midline Location: L3-4 Injection technique: single-shot Needle Needle type: Pencan  Needle gauge: 24 G Needle length: 10 cm Needle insertion depth: 7 cm

## 2018-01-06 NOTE — Anesthesia Procedure Notes (Signed)
Anesthesia Regional Block: Adductor canal block   Pre-Anesthetic Checklist: ,, timeout performed, Correct Patient, Correct Site, Correct Laterality, Correct Procedure, Correct Position, site marked, Risks and benefits discussed,  Surgical consent,  Pre-op evaluation,  At surgeon's request and post-op pain management  Laterality: Left  Prep: chloraprep       Needles:  Injection technique: Single-shot  Needle Type: Echogenic Needle     Needle Length: 9cm  Needle Gauge: 21     Additional Needles:   Procedures:,,,, ultrasound used (permanent image in chart),,,,  Narrative:  Start time: 01/06/2018 1:00 PM End time: 01/06/2018 1:05 PM Injection made incrementally with aspirations every 5 mL.  Performed by: Personally  Anesthesiologist: Catalina Gravel, MD  Additional Notes: No pain on injection. No increased resistance to injection. Injection made in 5cc increments.  Good needle visualization.  Patient tolerated procedure well.

## 2018-01-06 NOTE — Progress Notes (Signed)
AssistedDr. Turk with left, ultrasound guided, adductor canal block. Side rails up, monitors on throughout procedure. See vital signs in flow sheet. Tolerated Procedure well.  

## 2018-01-06 NOTE — Anesthesia Preprocedure Evaluation (Addendum)
Anesthesia Evaluation  Patient identified by MRN, date of birth, ID band Patient awake    Reviewed: Allergy & Precautions, NPO status , Patient's Chart, lab work & pertinent test results  History of Anesthesia Complications Negative for: history of anesthetic complications  Airway Mallampati: II  TM Distance: >3 FB Neck ROM: Full    Dental  (+) Teeth Intact, Dental Advisory Given   Pulmonary former smoker,    Pulmonary exam normal breath sounds clear to auscultation       Cardiovascular Exercise Tolerance: Good negative cardio ROS Normal cardiovascular exam Rhythm:Regular Rate:Normal     Neuro/Psych PSYCHIATRIC DISORDERS Anxiety Depression negative neurological ROS     GI/Hepatic Neg liver ROS, GERD  Medicated and Controlled,  Endo/Other  Hypothyroidism   Renal/GU negative Renal ROS     Musculoskeletal  (+) Arthritis , Osteoarthritis,    Abdominal   Peds  Hematology  (+) Blood dyscrasia, anemia , Plt 381k   Anesthesia Other Findings Day of surgery medications reviewed with the patient.  Reproductive/Obstetrics                            Anesthesia Physical Anesthesia Plan  ASA: II  Anesthesia Plan: Spinal   Post-op Pain Management:  Regional for Post-op pain   Induction: Intravenous  PONV Risk Score and Plan: 2 and Dexamethasone and Ondansetron  Airway Management Planned: Simple Face Mask  Additional Equipment:   Intra-op Plan:   Post-operative Plan:   Informed Consent: I have reviewed the patients History and Physical, chart, labs and discussed the procedure including the risks, benefits and alternatives for the proposed anesthesia with the patient or authorized representative who has indicated his/her understanding and acceptance.   Dental advisory given  Plan Discussed with: CRNA  Anesthesia Plan Comments: (Spinal plus adductor canal block.)        Anesthesia  Quick Evaluation

## 2018-01-06 NOTE — Anesthesia Postprocedure Evaluation (Signed)
Anesthesia Post Note  Patient: [place]  Procedure(s) Performed: LEFT TOTAL KNEE ARTHROPLASTY (Left Knee)     Patient location during evaluation: PACU Anesthesia Type: Spinal Level of consciousness: oriented and awake and alert Pain management: pain level controlled Vital Signs Assessment: post-procedure vital signs reviewed and stable Respiratory status: spontaneous breathing, respiratory function stable and patient connected to nasal cannula oxygen Cardiovascular status: blood pressure returned to baseline and stable Postop Assessment: no headache, no backache, no apparent nausea or vomiting, spinal receding and patient able to bend at knees Anesthetic complications: no    Last Vitals:  Vitals:   01/06/18 1823 01/06/18 1939  BP: 119/76 119/63  Pulse: 79 87  Resp: 16 20  Temp: 36.8 C 36.6 C  SpO2: 98% 100%    Last Pain:  Vitals:   01/06/18 1939  TempSrc: Oral  PainSc:                  Catalina Gravel

## 2018-01-06 NOTE — Transfer of Care (Signed)
Immediate Anesthesia Transfer of Care Note  Patient: Judy Morrison  Procedure(s) Performed: LEFT TOTAL KNEE ARTHROPLASTY (Left Knee)  Patient Location: PACU  Anesthesia Type:Spinal  Level of Consciousness: awake, alert , oriented and patient cooperative  Airway & Oxygen Therapy: Patient Spontanous Breathing and Patient connected to face mask oxygen  Post-op Assessment: Report given to RN and Post -op Vital signs reviewed and stable  Post vital signs: stable  Last Vitals:  Vitals Value Taken Time  BP    Temp    Pulse 84 01/06/2018  4:13 PM  Resp 13 01/06/2018  4:13 PM  SpO2 100 % 01/06/2018  4:13 PM  Vitals shown include unvalidated device data.  Last Pain:  Vitals:   01/06/18 1135  TempSrc:   PainSc: 2       Patients Stated Pain Goal: 3 (33/38/32 9191)  Complications: No apparent anesthesia complications

## 2018-01-06 NOTE — Anesthesia Procedure Notes (Signed)
Procedure Name: MAC Date/Time: 01/06/2018 2:05 PM Performed by: Lissa Morales, CRNA Pre-anesthesia Checklist: Emergency Drugs available, Patient identified, Suction available, Patient being monitored and Timeout performed Patient Re-evaluated:Patient Re-evaluated prior to induction Oxygen Delivery Method: Simple face mask Placement Confirmation: positive ETCO2

## 2018-01-06 NOTE — Op Note (Signed)
DATE OF SURGERY:  01/06/2018  TIME: 3:51 PM  PATIENT NAME:  Judy Morrison    AGE: 67 y.o.   PRE-OPERATIVE DIAGNOSIS:  left knee osteoarthritis  POST-OPERATIVE DIAGNOSIS:  left knee osteoarthritis  PROCEDURE:  Procedure(s): LEFT TOTAL KNEE ARTHROPLASTY  SURGEON:  Ariyan Sinnett ANDREW  ASSISTANT:  Bryson Stilwell, PA-C, present and scrubbed throughout the case, critical for assistance with exposure, retraction, instrumentation, and closure.  OPERATIVE IMPLANTS: Depuy PFC Sigma Rotating Platform.  Femur size 3, Tibia size 3, Patella size 32 3-peg oval button, with a 12.5 mm polyethylene insert.   PREOPERATIVE INDICATIONS:   Judy Morrison is a 67 y.o. year old female with end stage bone on bone arthritis of the knee who failed conservative treatment and elected for Total Knee Arthroplasty.   The risks, benefits, and alternatives were discussed at length including but not limited to the risks of infection, bleeding, nerve injury, stiffness, blood clots, the need for revision surgery, cardiopulmonary complications, among others, and they were willing to proceed.  OPERATIVE DESCRIPTION:  The patient was brought to the operative room and placed in a supine position.  Spinal anesthesia was administered.  IV antibiotics were given.  The lower extremity was prepped and draped in the usual sterile fashion.  Time out was performed.  The leg was elevated and exsanguinated and the tourniquet was inflated.  Anterior quadriceps tendon splitting approach was performed.  The patella was retracted and osteophytes were removed.  The anterior horn of the medial and lateral meniscus was removed and cruciate ligaments resected.   The distal femur was opened with the drill and the intramedullary distal femoral cutting jig was utilized, set at 5 degrees resecting 10 mm off the distal femur.  Care was taken to protect the collateral ligaments.  The distal femoral sizing jig was applied, taking  care to avoid notching.  Then the 4-in-1 cutting jig was applied and the anterior and posterior femur was cut, along with the chamfer cuts.    Then the extramedullary tibial cutting jig was utilized making the appropriate cut using the anterior tibial crest as a reference building in appropriate posterior slope.  Care was taken during the cut to protect the medial and collateral ligaments.  The proximal tibia was removed along with the posterior horns of the menisci.   The posterior medial femoral osteophytes and posterior lateral femoral osteophytes were removed.    The flexion gap was then measured and was symmetric with the extension gap, measured at 12.  I completed the distal femoral preparation using the appropriate jig to prepare the box.  The patella was then measured, and cut with the saw.    The proximal tibia sized and prepared accordingly with the reamer and the punch, and then all components were trialed with the trial insert.  The knee was found to have excellent balance and full motion.    The above named components were then cemented into place and all excess cement was removed.  The trial polyethylene component was in place during cementation, and then was exchanged for the real polyethylene component.    The knee was easily taken through a range of motion and the patella tracked well and the knee irrigated copiously and the parapatellar and subcutaneous tissue closed with vicryl, and monocryl with steri strips for the skin.  The arthrotomy was closed at 90 of flexion. The wounds were dressed with sterile gauze and the tourniquet released and the patient was awakened and returned to the PACU  in stable and satisfactory condition.  There were no complications.  Total tourniquet time was 75 minutes.

## 2018-01-06 NOTE — Interval H&P Note (Signed)
History and Physical Interval Note:  01/06/2018 1:48 PM  [name]  has presented today for surgery, with the diagnosis of left knee osteoarthritis  The various methods of treatment have been discussed with the patient and family. After consideration of risks, benefits and other options for treatment, the patient has consented to  Procedure(s): LEFT TOTAL KNEE ARTHROPLASTY (Left) as a surgical intervention .  The patient's history has been reviewed, patient examined, no change in status, stable for surgery.  I have reviewed the patient's chart and labs.  Questions were answered to the patient's satisfaction.     Judy Morrison ANDREW

## 2018-01-07 LAB — BASIC METABOLIC PANEL
ANION GAP: 9 (ref 5–15)
BUN: 12 mg/dL (ref 6–20)
CALCIUM: 8.5 mg/dL — AB (ref 8.9–10.3)
CO2: 24 mmol/L (ref 22–32)
CREATININE: 0.56 mg/dL (ref 0.44–1.00)
Chloride: 103 mmol/L (ref 101–111)
GFR calc Af Amer: >60 mL/min (ref >60)
GFR calc non Af Amer: >60 mL/min (ref >60)
GLUCOSE: 121 mg/dL — AB (ref 65–99)
Potassium: 3.7 mmol/L (ref 3.5–5.1)
Sodium: 136 mmol/L (ref 135–145)

## 2018-01-07 LAB — CBC
HCT: 28.8 % — ABNORMAL LOW (ref 36.0–46.0)
Hemoglobin: 9.4 g/dL — ABNORMAL LOW (ref 12.0–15.0)
MCH: 26 pg (ref 26.0–34.0)
MCHC: 32.6 g/dL (ref 30.0–36.0)
MCV: 79.6 fL (ref 78.0–100.0)
PLATELETS: 301 10*3/uL (ref 150–400)
RBC: 3.62 MIL/uL — ABNORMAL LOW (ref 3.87–5.11)
RDW: 14.8 % (ref 11.5–15.5)
WBC: 14 10*3/uL — AB (ref 4.0–10.5)

## 2018-01-07 NOTE — Evaluation (Signed)
Physical Therapy Evaluation Patient Details Name: Judy Morrison MRN: [id number] DOB: May 09, 1951 Today's Date: 01/07/2018   History of Present Illness  Pt s/p L TKR and with hx of R TKR  Clinical Impression  Pt s/p L TKR and presents with decreased L LE strength/ROM and post op pain limiting functional mobility.  Pt would benefit from follow up ST SNF level rehab to maximize IND and safety prior to return home with min assist.    Follow Up Recommendations Follow surgeon's recommendation for DC plan and follow-up therapies    Equipment Recommendations  None recommended by PT    Recommendations for Other Services       Precautions / Restrictions Precautions Precautions: Knee;Fall Restrictions Weight Bearing Restrictions: No Other Position/Activity Restrictions: WBAT      Mobility  Bed Mobility Overal bed mobility: Needs Assistance Bed Mobility: Supine to Sit     Supine to sit: Min assist     General bed mobility comments: cues for sequence and use of R LE to self assist  Transfers Overall transfer level: Needs assistance Equipment used: Rolling walker (2 wheeled) Transfers: Sit to/from Stand Sit to Stand: Min assist         General transfer comment: cues for LE management and use of UEs to self assist  Ambulation/Gait Ambulation/Gait assistance: Min assist Ambulation Distance (Feet): 60 Feet Assistive device: Rolling walker (2 wheeled) Gait Pattern/deviations: Step-to pattern;Step-through pattern;Decreased step length - right;Decreased step length - left;Shuffle;Trunk flexed Gait velocity: decr   General Gait Details: cues for posture, position from RW and initial sequence  Stairs            Wheelchair Mobility    Modified Rankin (Stroke Patients Only)       Balance                                             Pertinent Vitals/Pain Pain Assessment: 0-10 Pain Score: 3  Pain Location: L knee Pain Descriptors /  Indicators: Aching;Sore Pain Intervention(s): Limited activity within patient's tolerance;Monitored during session;Premedicated before session;Ice applied    Home Living Family/patient expects to be discharged to:: Skilled nursing facility Living Arrangements: Alone               Additional Comments: Pt states lives alone and has been approved for 5 days at [facility]    Prior Function Level of Independence: Independent               Hand Dominance   Dominant Hand: Right    Extremity/Trunk Assessment   Upper Extremity Assessment Upper Extremity Assessment: Overall WFL for tasks assessed    Lower Extremity Assessment Lower Extremity Assessment: LLE deficits/detail LLE Deficits / Details: 3/5 quads with IND SLR and AAROM at knee -8 - 70    Cervical / Trunk Assessment Cervical / Trunk Assessment: Normal  Communication   Communication: No difficulties  Cognition Arousal/Alertness: Awake/alert Behavior During Therapy: WFL for tasks assessed/performed Overall Cognitive Status: Within Functional Limits for tasks assessed                                        General Comments      Exercises Total Joint Exercises Ankle Circles/Pumps: AROM;Both;15 reps;Supine Quad Sets: AROM;Both;10 reps;Supine Heel Slides: AAROM;Left;15 reps;Supine Straight Leg Raises:  Left;AAROM;AROM;Supine;15 reps   Assessment/Plan    PT Assessment Patient needs continued PT services  PT Problem List Decreased range of motion;Decreased activity tolerance;Decreased strength;Decreased mobility;Pain;Decreased knowledge of use of DME       PT Treatment Interventions DME instruction;Gait training;Stair training;Functional mobility training;Therapeutic activities;Therapeutic exercise;Patient/family education    PT Goals (Current goals can be found in the Care Plan section)  Acute Rehab PT Goals Patient Stated Goal: Regain IND PT Goal Formulation: With patient Time For Goal  Achievement: 01/14/18 Potential to Achieve Goals: Good    Frequency 7X/week   Barriers to discharge        Co-evaluation               AM-PAC PT "6 Clicks" Daily Activity  Outcome Measure Difficulty turning over in bed (including adjusting bedclothes, sheets and blankets)?: A Lot Difficulty moving from lying on back to sitting on the side of the bed? : A Lot Difficulty sitting down on and standing up from a chair with arms (e.g., wheelchair, bedside commode, etc,.)?: A Lot Help needed moving to and from a bed to chair (including a wheelchair)?: A Little Help needed walking in hospital room?: A Little Help needed climbing 3-5 steps with a railing? : A Lot 6 Click Score: 14    End of Session Equipment Utilized During Treatment: Gait belt Activity Tolerance: Patient tolerated treatment well Patient left: in chair;with call bell/phone within reach;with chair alarm set Nurse Communication: Mobility status PT Visit Diagnosis: Difficulty in walking, not elsewhere classified (R26.2)    Time: 2952-8413 PT Time Calculation (min) (ACUTE ONLY): 34 min   Charges:   PT Evaluation $PT Eval Low Complexity: 1 Low PT Treatments $Therapeutic Exercise: 8-22 mins   PT G Codes:        Pg [phone]   Melvin Marmo 01/07/2018, 12:27 PM

## 2018-01-07 NOTE — Progress Notes (Signed)
Physical Therapy Treatment Patient Details Name: Judy Morrison MRN: [id number] DOB: 07/27/1951 Today's Date: 01/07/2018    History of Present Illness Pt s/p L TKR and with hx of R TKR    PT Comments    Pt continue motivated and progressing steadily with mobility.     Follow Up Recommendations  Follow surgeon's recommendation for DC plan and follow-up therapies     Equipment Recommendations  None recommended by PT    Recommendations for Other Services       Precautions / Restrictions Precautions Precautions: Knee;Fall Restrictions Weight Bearing Restrictions: No Other Position/Activity Restrictions: WBAT    Mobility  Bed Mobility Overal bed mobility: Needs Assistance Bed Mobility: Supine to Sit;Sit to Supine     Supine to sit: Min guard Sit to supine: Min assist   General bed mobility comments: cues for sequence and use of R LE to self assist  Transfers Overall transfer level: Needs assistance Equipment used: Rolling walker (2 wheeled) Transfers: Sit to/from Stand Sit to Stand: Min assist         General transfer comment: cues for LE management and use of UEs to self assist  Ambulation/Gait Ambulation/Gait assistance: Min assist;Min guard Ambulation Distance (Feet): 75 Feet(twice) Assistive device: Rolling walker (2 wheeled) Gait Pattern/deviations: Step-to pattern;Step-through pattern;Decreased step length - right;Decreased step length - left;Shuffle;Trunk flexed Gait velocity: decr   General Gait Details: cues for posture, position from RW and initial sequence   Stairs             Wheelchair Mobility    Modified Rankin (Stroke Patients Only)       Balance                                            Cognition Arousal/Alertness: Awake/alert Behavior During Therapy: WFL for tasks assessed/performed Overall Cognitive Status: Within Functional Limits for tasks assessed                                         Exercises Total Joint Exercises Ankle Circles/Pumps: AROM;Both;15 reps;Supine Quad Sets: AROM;Both;10 reps;Supine Heel Slides: AAROM;Left;15 reps;Supine Straight Leg Raises: Left;AAROM;AROM;Supine;15 reps    General Comments        Pertinent Vitals/Pain Pain Assessment: 0-10 Pain Score: 3  Pain Location: L knee Pain Descriptors / Indicators: Aching;Sore Pain Intervention(s): Limited activity within patient's tolerance;Monitored during session;Premedicated before session;Ice applied    Home Living Family/patient expects to be discharged to:: Skilled nursing facility Living Arrangements: Alone             Additional Comments: Pt states lives alone and has been approved for 5 days at [facility]    Prior Function Level of Independence: Independent          PT Goals (current goals can now be found in the care plan section) Acute Rehab PT Goals Patient Stated Goal: Regain IND PT Goal Formulation: With patient Time For Goal Achievement: 01/14/18 Potential to Achieve Goals: Good Progress towards PT goals: Progressing toward goals    Frequency    7X/week      PT Plan Current plan remains appropriate    Co-evaluation              AM-PAC PT "6 Clicks" Daily Activity  Outcome Measure  Difficulty turning over  in bed (including adjusting bedclothes, sheets and blankets)?: A Lot Difficulty moving from lying on back to sitting on the side of the bed? : A Lot Difficulty sitting down on and standing up from a chair with arms (e.g., wheelchair, bedside commode, etc,.)?: A Lot Help needed moving to and from a bed to chair (including a wheelchair)?: A Little Help needed walking in hospital room?: A Little Help needed climbing 3-5 steps with a railing? : A Lot 6 Click Score: 14    End of Session Equipment Utilized During Treatment: Gait belt Activity Tolerance: Patient tolerated treatment well Patient left: in bed;with call bell/phone within reach Nurse  Communication: Mobility status PT Visit Diagnosis: Difficulty in walking, not elsewhere classified (R26.2)     Time: 5400-8676 PT Time Calculation (min) (ACUTE ONLY): 27 min  Charges:  $Gait Training: 8-22 mins $Therapeutic Exercise: 8-22 mins                    G Codes:       Pg [phone]    Vaudine Dutan 01/07/2018, 2:10 PM

## 2018-01-07 NOTE — Plan of Care (Signed)
Plan of care discussed.   

## 2018-01-07 NOTE — Progress Notes (Signed)
   Subjective:  Patient reports pain as mild.  Has been up with PT and walked the hall.  States pain is well controlled.  Denies n/;v/sob/CP.  Objective:   VITALS:   Vitals:   01/06/18 1939 01/06/18 2025 01/07/18 0134 01/07/18 0534  BP: 119/63 122/60 120/70 111/69  Pulse: 87 85 91 85  Resp: 20 16 16 16   Temp: 97.8 F (36.6 C) 97.9 F (36.6 C) 97.7 F (36.5 C) 97.7 F (36.5 C)  TempSrc: Oral Oral Oral Oral  SpO2: 100% 98% 97% 99%  Weight:      Height:        Neurologically intact Neurovascular intact Sensation intact distally Intact pulses distally Dorsiflexion/Plantar flexion intact Incision: dressing C/D/I Compartment soft HVAC pulled   Lab Results  Component Value Date   WBC 14.0 (H) 01/07/2018   HGB 9.4 (L) 01/07/2018   HCT 28.8 (L) 01/07/2018   MCV 79.6 01/07/2018   PLT 301 01/07/2018   BMET    Component Value Date/Time   NA 136 01/07/2018 0523   K 3.7 01/07/2018 0523   CL 103 01/07/2018 0523   CO2 24 01/07/2018 0523   GLUCOSE 121 (H) 01/07/2018 0523   BUN 12 01/07/2018 0523   CREATININE 0.56 01/07/2018 0523   CALCIUM 8.5 (L) 01/07/2018 0523   GFRNONAA >60 01/07/2018 0523   GFRAA >60 01/07/2018 0523     Assessment/Plan: 1 Day Post-Op   Active Problems:   S/P knee replacement   Primary osteoarthritis of left knee   Advance diet Up with therapy D/C IV fluids Doing well lovenox and SCDs for DVT ppx in the hospital, home on asa Dispo:  Plan for dc to [facility] when bed available.   Nicholes Stairs 01/07/2018, 10:29 AM   Geralynn Rile, MD [phone]

## 2018-01-08 LAB — CBC
HEMATOCRIT: 29.6 % — AB (ref 36.0–46.0)
HEMOGLOBIN: 9.6 g/dL — AB (ref 12.0–15.0)
MCH: 25.9 pg — AB (ref 26.0–34.0)
MCHC: 32.4 g/dL (ref 30.0–36.0)
MCV: 80 fL (ref 78.0–100.0)
Platelets: 367 10*3/uL (ref 150–400)
RBC: 3.7 MIL/uL — ABNORMAL LOW (ref 3.87–5.11)
RDW: 15.8 % — ABNORMAL HIGH (ref 11.5–15.5)
WBC: 12.4 10*3/uL — ABNORMAL HIGH (ref 4.0–10.5)

## 2018-01-08 NOTE — Progress Notes (Signed)
Physical Therapy Treatment Patient Details Name: Judy Morrison MRN: [id number] DOB: 1951/01/22 Today's Date: 01/08/2018    History of Present Illness Pt s/p L TKR and with hx of R TKR    PT Comments    Patient progressing with ambulation distance and general mobility for functional tasks.  Today, though more painful in the knee and did not push ROM, though educated pt how to mobilize seated EOB with improved tolerance.  PT to follow acutely.   Follow Up Recommendations  Follow surgeon's recommendation for DC plan and follow-up therapies     Equipment Recommendations  None recommended by PT    Recommendations for Other Services       Precautions / Restrictions Precautions Precautions: Knee;Fall Restrictions Other Position/Activity Restrictions: WBAT    Mobility  Bed Mobility Overal bed mobility: Needs Assistance Bed Mobility: Supine to Sit;Sit to Supine     Supine to sit: Supervision Sit to supine: Min guard   General bed mobility comments: assist for R LE  Transfers Overall transfer level: Needs assistance Equipment used: Rolling walker (2 wheeled) Transfers: Sit to/from Stand Sit to Stand: Min guard         General transfer comment: for safety/balance  Ambulation/Gait Ambulation/Gait assistance: Min guard Ambulation Distance (Feet): 90 Feet Assistive device: Rolling walker (2 wheeled) Gait Pattern/deviations: Step-through pattern;Decreased stride length;Antalgic     General Gait Details: cues for distance and sequence   Stairs             Wheelchair Mobility    Modified Rankin (Stroke Patients Only)       Balance Overall balance assessment: Needs assistance   Sitting balance-Leahy Scale: Good       Standing balance-Leahy Scale: Fair Standing balance comment: able to complete toileting with dist S                            Cognition Arousal/Alertness: Awake/alert Behavior During Therapy: WFL for tasks  assessed/performed Overall Cognitive Status: Within Functional Limits for tasks assessed                                        Exercises Total Joint Exercises Ankle Circles/Pumps: 10 reps;AROM;Both;Supine Quad Sets: 10 reps;AROM;Left;Supine Short Arc Quad: Supine;Left;10 reps;AROM Heel Slides: 10 reps;AROM;AAROM;Left;Supine Straight Leg Raises: AROM;10 reps;Left;Supine Knee Flexion: AROM;10 reps;Left;Seated Goniometric ROM: at EOB approx 40    General Comments        Pertinent Vitals/Pain Pain Score: 6  Pain Location: L knee Pain Descriptors / Indicators: Aching;Sore Pain Intervention(s): Repositioned;Monitored during session    Home Living                      Prior Function            PT Goals (current goals can now be found in the care plan section) Progress towards PT goals: Progressing toward goals    Frequency    7X/week      PT Plan Current plan remains appropriate    Co-evaluation              AM-PAC PT "6 Clicks" Daily Activity  Outcome Measure  Difficulty turning over in bed (including adjusting bedclothes, sheets and blankets)?: A Lot Difficulty moving from lying on back to sitting on the side of the bed? : A Little Difficulty sitting down on and  standing up from a chair with arms (e.g., wheelchair, bedside commode, etc,.)?: A Little Help needed moving to and from a bed to chair (including a wheelchair)?: A Little Help needed walking in hospital room?: A Little Help needed climbing 3-5 steps with a railing? : A Little 6 Click Score: 17    End of Session Equipment Utilized During Treatment: Gait belt Activity Tolerance: Patient tolerated treatment well Patient left: in bed;with call bell/phone within reach   PT Visit Diagnosis: Difficulty in walking, not elsewhere classified (R26.2);Pain Pain - Right/Left: Left Pain - part of body: Knee     Time: 4008-6761 PT Time Calculation (min) (ACUTE ONLY): 23  min  Charges:  $Gait Training: 8-22 mins $Therapeutic Exercise: 8-22 mins                    G CodesMagda Morrison, [place] [phone] 01/08/2018    Judy Morrison 01/08/2018, 3:32 PM

## 2018-01-08 NOTE — Progress Notes (Signed)
    Subjective: 2 Days Post-Op Procedure(s) (LRB): LEFT TOTAL KNEE ARTHROPLASTY (Left) Patient reports pain as 3 on 0-10 scale.  To 4/10 Denies CP or SOB.  Voiding without difficulty. Positive flatus. Objective: Vital signs in last 24 hours: Temp:  [97.7 F (36.5 C)-98 F (36.7 C)] 97.8 F (36.6 C) (04/28 0533) Pulse Rate:  [90-99] 96 (04/28 0533) Resp:  [16-18] 17 (04/28 0533) BP: (100-135)/(62-69) 126/68 (04/28 0533) SpO2:  [94 %-99 %] 94 % (04/28 0533)  Intake/Output from previous day: 04/27 0701 - 04/28 0700 In: 806.3 [P.O.:600; I.V.:206.3] Out: 2450 [Urine:2450] Intake/Output this shift: No intake/output data recorded.  Labs: Recent Labs    01/07/18 0523 01/08/18 0530  HGB 9.4* 9.6*   Recent Labs    01/07/18 0523 01/08/18 0530  WBC 14.0* 12.4*  RBC 3.62* 3.70*  HCT 28.8* 29.6*  PLT 301 367   Recent Labs    01/07/18 0523  NA 136  K 3.7  CL 103  CO2 24  BUN 12  CREATININE 0.56  GLUCOSE 121*  CALCIUM 8.5*   No results for input(s): LABPT, INR in the last 72 hours.  Physical Exam: Neurologically intact ABD soft Sensation intact distally Dorsiflexion/Plantar flexion intact Incision: scant drainage Compartment soft Body mass index is 25.46 kg/m.   Assessment/Plan: 2 Days Post-Op Procedure(s) (LRB): LEFT TOTAL KNEE ARTHROPLASTY (Left) Advance diet Up with therapy  Pt hopeful to DC to Rehab tomorrow Pt says she is confused about "Bundle" and her length of stay at Rehab.  She will consult with Sharee Pimple tomorrow.   [place], Darla Lesches for Dr. Melina Schools [facility] [phone] 01/08/2018, 8:05 AM

## 2018-01-09 ENCOUNTER — Encounter (HOSPITAL_COMMUNITY): Payer: Self-pay | Admitting: Specialist

## 2018-01-09 LAB — CBC
HCT: 26.4 % — ABNORMAL LOW (ref 36.0–46.0)
Hemoglobin: 8.6 g/dL — ABNORMAL LOW (ref 12.0–15.0)
MCH: 25.7 pg — ABNORMAL LOW (ref 26.0–34.0)
MCHC: 32.6 g/dL (ref 30.0–36.0)
MCV: 79 fL (ref 78.0–100.0)
Platelets: 290 10*3/uL (ref 150–400)
RBC: 3.34 MIL/uL — ABNORMAL LOW (ref 3.87–5.11)
RDW: 15.7 % — AB (ref 11.5–15.5)
WBC: 9.2 10*3/uL (ref 4.0–10.5)

## 2018-01-09 NOTE — Care Management Important Message (Signed)
Important Message  Patient Details  Name: Judy Morrison MRN: [id number] Date of Birth: 1951-04-28   Medicare Important Message Given:  Yes    Kerin Salen 01/09/2018, 11:04 AMImportant Message  Patient Details  Name: Judy Morrison MRN: [id number] Date of Birth: [birth year]   Medicare Important Message Given:  Yes    Kerin Salen 01/09/2018, 11:03 AM

## 2018-01-09 NOTE — Progress Notes (Signed)
Subjective: 3 Days Post-Op Procedure(s) (LRB): LEFT TOTAL KNEE ARTHROPLASTY (Left) Patient reports pain as controlled.  Doing Well. No CP or SOB.  Objective: Vital signs in last 24 hours: Temp:  [98.4 F (36.9 C)-99.2 F (37.3 C)] 99 F (37.2 C) (04/29 0652) Pulse Rate:  [103-111] 111 (04/29 0652) Resp:  [14-16] 16 (04/29 0652) BP: (126-154)/(71-85) 142/85 (04/29 0652) SpO2:  [89 %-97 %] 89 % (04/29 0652)  Intake/Output from previous day: 04/28 0701 - 04/29 0700 In: 720 [P.O.:720] Out: -  Intake/Output this shift: No intake/output data recorded.  Recent Labs    01/07/18 0523 01/08/18 0530 01/09/18 0515  HGB 9.4* 9.6* 8.6*   Recent Labs    01/08/18 0530 01/09/18 0515  WBC 12.4* 9.2  RBC 3.70* 3.34*  HCT 29.6* 26.4*  PLT 367 290   Recent Labs    01/07/18 0523  NA 136  K 3.7  CL 103  CO2 24  BUN 12  CREATININE 0.56  GLUCOSE 121*  CALCIUM 8.5*   No results for input(s): LABPT, INR in the last 72 hours.  Alert and oriented x3. RRR, Lungs clear, BS x4. Left Calf soft and non tender. L knee dressing C/D/I. No DVT signs. No signs of infection or compartment syndrome. LLE grossly neurovascularly intact.   Anticipated LOS equal to or greater than 2 midnights due to - Age 69 and older with one or more of the following:  - Obesity  - Expected need for hospital services (PT, OT, Nursing) required for safe  discharge  - Anticipated need for postoperative skilled nursing care or inpatient rehab  - Active co-morbidities: None OR   - Unanticipated findings during/Post Surgery: None  - Patient is a high risk of re-admission due to: None   Assessment/Plan: 3 Days Post-Op Procedure(s) (LRB): LEFT TOTAL KNEE ARTHROPLASTY (Left) Up with therapy Discharge to SNF  F/u in office ASA BID at D/c Follow in structions Ok to shower    Kyleigha Markert L 01/09/2018, 8:01 AM

## 2018-01-09 NOTE — Clinical Social Work Note (Addendum)
Clinical Social Work Assessment  Patient Details  Name: Judy Morrison MRN: [id number] Date of Birth: Apr 02, 1951  Date of referral:  01/09/18               Reason for consult:  Facility Placement                Permission sought to share information with:    Permission granted to share information::  Yes, Verbal Permission Granted  Name::        Agency::  SNF   Relationship::  Daughter  Contact Information:     Housing/Transportation Living arrangements for the past 2 months:  [place] of Information:  Patient Patient Interpreter Needed:  None Criminal Activity/Legal Involvement Pertinent to Current Situation/Hospitalization:  No - Comment as needed Significant Relationships:  Adult Children Lives with:  Self Do you feel safe going back to the place where you live?  Yes Need for family participation in patient care:  Yes (Temporarily dependent with mobility)  Care giving concerns:   SNF placement.  Social Worker assessment / plan:  CSW met with the patient at bedside, explained role and reason for visit- to assist with discharge to SNF.  Patient reports it was prearranged again for her to go to [facility] for rehab. Patient reports she lives alone and is independent with all of her ADL's.  Patient reports her daughter plans to transport her to the facility. Patient express she will only stay in rehab for about week and is hopeful she will progress well.   Plan: SNF placement  Employment status:  Retired Forensic scientist:  Medicare PT Recommendations:  [place] / Referral to community resources:  [place]  Patient/Family's Response to care:  Agreeable and Responding well to care.   Patient/Family's Understanding of and Emotional Response to Diagnosis, Current Treatment, and Prognosis:  Patient has a good understanding her diagnosis and  after follow up care. Patient daughter is involved in patient  care.   Emotional Assessment Appearance:  Appears stated age Attitude/Demeanor/Rapport:    Affect (typically observed):  Accepting, Pleasant Orientation:  Oriented to Self, Oriented to Place, Oriented to  Time, Oriented to Situation Alcohol / Substance use:  Not Applicable Psych involvement (Current and /or in the community):  No (Comment)  Discharge Needs  Concerns to be addressed:  Discharge Planning Concerns Readmission within the last 30 days:  No Current discharge risk:  Dependent with Mobility Barriers to Discharge:  [place] ([place])   Lia Hopping, LCSW 01/09/2018, 3:36 PM

## 2018-01-09 NOTE — NC FL2 (Signed)
[place] MEDICAID FL2 LEVEL OF CARE SCREENING TOOL     IDENTIFICATION  Patient Name: Judy Morrison Birthdate: 1951/09/08 Sex: female Admission Date (Current Location): 01/06/2018  [facility] and [place] Number:  Herbalist and Address:  [facility],  [place] [address] [place]      Provider Number: [id number]  Attending Physician Name and Address:  Sydnee Cabal, MD  Relative Name and Phone Number:       Current Level of Care: Hospital Recommended Level of Care: [place] Prior Approval Number:    Date Approved/Denied:   PASRR Number:    Discharge Plan: SNF    Current Diagnoses: Patient Active Problem List   Diagnosis Date Noted  . Primary osteoarthritis of left knee 01/06/2018  . S/P knee replacement 08/12/2017  . Incomplete tear of right rotator cuff 05/19/2017  . Osteopenia 05/19/2017  . Urge incontinence of urine 05/19/2017  . Primary open angle glaucoma (POAG) 12/30/2016  . Primary osteoarthritis of both knees 12/30/2016  . Recurrent oral herpes simplex infection 12/30/2016  . Tubular adenoma of colon 12/30/2016  . Hypothyroidism 06/25/2016  . Right foot pain 12/19/2014  . Anxiety 05/21/2011  . Seasonal allergies 05/21/2011    Orientation RESPIRATION BLADDER Height & Weight     Self, Time, Situation, Place  Normal Continent Weight: 153 lb (69.4 kg) Height:  5\' 5"  (165.1 cm)  BEHAVIORAL SYMPTOMS/MOOD NEUROLOGICAL BOWEL NUTRITION STATUS      Continent Diet(Regular Diet)  AMBULATORY STATUS COMMUNICATION OF NEEDS Skin     Verbally Surgical wounds                       Personal Care Assistance Level of Assistance  Bathing, Feeding, Dressing Bathing Assistance: Limited assistance Feeding assistance: Independent Dressing Assistance: Limited assistance     Functional Limitations Info  Sight, Hearing, Speech Sight Info: Adequate Hearing Info: Adequate Speech Info: Adequate    SPECIAL CARE  FACTORS FREQUENCY  PT (By licensed PT), OT (By licensed OT)     PT Frequency: 7x/week  OT Frequency: 7x/week             Contractures Contractures Info: Not present    Additional Factors Info  Code Status, Allergies, Psychotropic Code Status Info: fullcode Allergies Info: Allergies: Codeine, Erythromycin, Penicillins Psychotropic Info: Lonopin, Cymbalta         Current Medications (01/09/2018):  This is the current hospital active medication list Current Facility-Administered Medications  Medication Dose Route Frequency Provider Last Rate Last Dose  . 0.9 %  sodium chloride infusion   Intravenous Continuous Stilwell, Bryson L, PA-C   Stopped at 01/07/18 0829  . acetaminophen (TYLENOL) tablet 325-650 mg  325-650 mg Oral Q6H PRN Stilwell, Bryson L, PA-C      . alum & mag hydroxide-simeth (MAALOX/MYLANTA) 200-200-20 MG/5ML suspension 30 mL  30 mL Oral Q4H PRN Stilwell, Bryson L, PA-C      . bisacodyl (DULCOLAX) EC tablet 5 mg  5 mg Oral Daily PRN Stilwell, Bryson L, PA-C      . Brexpiprazole TABS 1 mg  1 mg Oral Daily Stilwell, Bryson L, PA-C   1 mg at 01/08/18 0945  . clonazePAM (KLONOPIN) tablet 0.25 mg  0.25 mg Oral BID Stilwell, Bryson L, PA-C   0.25 mg at 01/08/18 2109  . diphenhydrAMINE (BENADRYL) 12.5 MG/5ML elixir 12.5-25 mg  12.5-25 mg Oral Q4H PRN Stilwell, Bryson L, PA-C      . docusate sodium (COLACE)  capsule 100 mg  100 mg Oral BID Stilwell, Bryson L, PA-C   100 mg at 01/08/18 2111  . DULoxetine (CYMBALTA) DR capsule 20 mg  20 mg Oral Daily Stilwell, Bryson L, PA-C   20 mg at 01/08/18 0948  . enoxaparin (LOVENOX) injection 30 mg  30 mg Subcutaneous Q12H Stilwell, Bryson L, PA-C   30 mg at 01/08/18 2028  . ferrous sulfate tablet 325 mg  325 mg Oral TID PC Stilwell, Bryson L, PA-C   325 mg at 01/08/18 1747  . HYDROmorphone (DILAUDID) injection 0.5-1 mg  0.5-1 mg Intravenous Q4H PRN Stilwell, Bryson L, PA-C      . HYDROmorphone (DILAUDID) tablet 2 mg  2 mg Oral Q4H PRN  Stilwell, Bryson L, PA-C   2 mg at 01/09/18 1040  . latanoprost (XALATAN) 0.005 % ophthalmic solution 1 drop  1 drop Both Eyes QHS Stilwell, Bryson L, PA-C   1 drop at 01/08/18 2108  . levothyroxine (SYNTHROID, LEVOTHROID) tablet 50 mcg  50 mcg Oral QHS Stilwell, Bryson L, PA-C   50 mcg at 01/08/18 2111  . loratadine (CLARITIN) tablet 10 mg  10 mg Oral Daily Stilwell, Bryson L, PA-C   10 mg at 01/08/18 0945  . magnesium citrate solution 1 Bottle  1 Bottle Oral Once PRN Stilwell, Bryson L, PA-C      . menthol-cetylpyridinium (CEPACOL) lozenge 3 mg  1 lozenge Oral PRN Stilwell, Bryson L, PA-C       Or  . phenol (CHLORASEPTIC) mouth spray 1 spray  1 spray Mouth/Throat PRN Stilwell, Bryson L, PA-C      . methocarbamol (ROBAXIN) tablet 500 mg  500 mg Oral Q6H PRN Stilwell, Bryson L, PA-C   500 mg at 01/08/18 1909   Or  . methocarbamol (ROBAXIN) 500 mg in dextrose 5 % 50 mL IVPB  500 mg Intravenous Q6H PRN Stilwell, Bryson L, PA-C      . metoCLOPramide (REGLAN) tablet 5-10 mg  5-10 mg Oral Q8H PRN Stilwell, Bryson L, PA-C       Or  . metoCLOPramide (REGLAN) injection 5-10 mg  5-10 mg Intravenous Q8H PRN Stilwell, Bryson L, PA-C      . nortriptyline (PAMELOR) capsule 100 mg  100 mg Oral QHS Stilwell, Bryson L, PA-C   100 mg at 01/08/18 2110  . ondansetron (ZOFRAN) tablet 4 mg  4 mg Oral Q6H PRN Stilwell, Bryson L, PA-C       Or  . ondansetron (ZOFRAN) injection 4 mg  4 mg Intravenous Q6H PRN Stilwell, Bryson L, PA-C      . pantoprazole (PROTONIX) EC tablet 40 mg  40 mg Oral Daily Stilwell, Bryson L, PA-C   40 mg at 01/08/18 0946  . polyethylene glycol (MIRALAX / GLYCOLAX) packet 17 g  17 g Oral Daily PRN Stilwell, Sueanne Margarita, PA-C         Discharge Medications: Please see discharge summary for a list of discharge medications.  Relevant Imaging Results:  Relevant Lab Results:   Additional Information SS: [id number] [phone]  Lia Hopping, LCSW

## 2018-01-09 NOTE — Progress Notes (Signed)
Physical Therapy Treatment Patient Details Name: Judy Morrison MRN: [id number] DOB: 05-25-1951 Today's Date: 01/09/2018    History of Present Illness Pt s/p L TKR and with hx of R TKR    PT Comments    POD # 3 am session Progressing slowly with issues of pain control and tightness.  Assisted with amb in hallway then back to room to perform some TKR TE's to tolerance.  Pt required increased time and rest breaks between each activity.  Pt had other knee done, stated "this is way worse".  Pt plans to D/C to SNF for ST Rehab.   Follow Up Recommendations  Follow surgeon's recommendation for DC plan and follow-up therapies;SNF     Equipment Recommendations  None recommended by PT    Recommendations for Other Services       Precautions / Restrictions Precautions Precautions: Knee;Fall Restrictions Weight Bearing Restrictions: No Other Position/Activity Restrictions: WBAT    Mobility  Bed Mobility               General bed mobility comments: OOB in recliner  Transfers Overall transfer level: Needs assistance Equipment used: Rolling walker (2 wheeled) Transfers: Sit to/from Stand Sit to Stand: Min guard         General transfer comment: for safety/balance  Ambulation/Gait Ambulation/Gait assistance: Min guard;Supervision Ambulation Distance (Feet): 75 Feet Assistive device: Rolling walker (2 wheeled) Gait Pattern/deviations: Step-through pattern;Decreased stride length;Antalgic Gait velocity: decreased   General Gait Details: cues for distance and sequence with decreased amb distance duie to increased c/o fatigue   Stairs             Wheelchair Mobility    Modified Rankin (Stroke Patients Only)       Balance                                            Cognition Arousal/Alertness: Awake/alert Behavior During Therapy: WFL for tasks assessed/performed Overall Cognitive Status: Within Functional Limits for tasks assessed                                         Exercises   Total Knee Replacement TE's 10 reps B LE ankle pumps 10 reps towel squeezes 10 reps knee presses 10 reps heel slides using long bed sheet approx 10 - 45 degrees only  Followed by ICE    General Comments        Pertinent Vitals/Pain Pain Assessment: 0-10 Pain Score: 5  Pain Location: L knee Pain Descriptors / Indicators: Aching;Sore;Operative site guarding Pain Intervention(s): Monitored during session;Repositioned;Premedicated before session    Home Living                      Prior Function            PT Goals (current goals can now be found in the care plan section) Progress towards PT goals: Progressing toward goals    Frequency    7X/week      PT Plan Current plan remains appropriate    Co-evaluation              AM-PAC PT "6 Clicks" Daily Activity  Outcome Measure  Difficulty turning over in bed (including adjusting bedclothes, sheets and blankets)?: A Lot Difficulty moving from lying  on back to sitting on the side of the bed? : A Little Difficulty sitting down on and standing up from a chair with arms (e.g., wheelchair, bedside commode, etc,.)?: A Little Help needed moving to and from a bed to chair (including a wheelchair)?: A Little Help needed walking in hospital room?: A Little Help needed climbing 3-5 steps with a railing? : A Lot 6 Click Score: 16    End of Session Equipment Utilized During Treatment: Gait belt Activity Tolerance: Patient tolerated treatment well Patient left: in chair;with call bell/phone within reach Nurse Communication: Mobility status PT Visit Diagnosis: Difficulty in walking, not elsewhere classified (R26.2);Pain Pain - Right/Left: Left Pain - part of body: Knee     Time: 1022-1050 PT Time Calculation (min) (ACUTE ONLY): 28 min  Charges:  $Gait Training: 8-22 mins $Therapeutic Activity: 8-22 mins                    G Codes:        Rica Koyanagi  PTA [facility]  Acute  Rehab Pager      [phone]

## 2018-01-09 NOTE — H&P (Addendum)
CSW following for patient discharge needs to [facility]. Patient has level II PASRR under review due to mental health history.  PASRR is requesting 30 day note.  Nurse assisting with physician signature.   3:31pm PASRR is still under manuel review.   4:37pm Patient and daughter updated at bedside. PASSR still pending. ED social worker given handoff- assist with D/C to SNF if PASRR number is received  before 7:00PM tonight. If not, CSW will continue to assist with patient discharge in the am. Patient daughter will transport.   Judy Morrison, Latanya Presser, MSW Clinical Social Worker  [phone] 01/09/2018  10:49 AM

## 2018-01-09 NOTE — Discharge Summary (Signed)
Physician Discharge Summary  Patient ID: Judy Morrison MRN: [id number] DOB/AGE: [birth year] 67 y.o.  Admit date: 01/06/2018 Discharge date: 01/09/2018  Admission Diagnoses:Left knee OA  Discharge Diagnoses:  Active Problems:   S/P knee replacement   Primary osteoarthritis of left knee   Discharged Condition: good  Hospital Course:  Judy Morrison is a 67 y.o. who was admitted to [facility] They were brought to the operating room on 01/06/2018 and underwent Procedure(s): LEFT TOTAL KNEE ARTHROPLASTY.  Patient tolerated the procedure well and was later transferred to the recovery room and then to the orthopaedic floor for postoperative care.  They were given PO and IV analgesics for pain control following their surgery.  They were given 24 hours of postoperative antibiotics of  Anti-infectives (From admission, onward)   Start     Dose/Rate Route Frequency Ordered Stop   01/06/18 2200  vancomycin (VANCOCIN) IVPB 1000 mg/200 mL premix     1,000 mg 200 mL/hr over 60 Minutes Intravenous Every 12 hours 01/06/18 1746 01/06/18 2320   01/06/18 1059  vancomycin (VANCOCIN) IVPB 1000 mg/200 mL premix     1,000 mg 200 mL/hr over 60 Minutes Intravenous On call to O.R. 01/06/18 1059 01/06/18 1422     and started on DVT prophylaxis in the form of lovenox.   PT and OT were ordered for total joint protocol.  Discharge planning consulted to help with postop disposition and equipment needs.  Patient had a good night on the evening of surgery and started to get up OOB with therapy on day one.  Hemovac drain was pulled without difficulty.  Continued to work with therapy into day two.  Dressing was with normal limits.  The patient had progressed with therapy and meeting their goals. Patient was seen in rounds and was ready to go home.  Consults: n/a  Significant Diagnostic Studies: routine  Treatments: routine  Discharge Exam: Blood pressure (!) 142/85, pulse (!) 111, temperature 99  F (37.2 C), resp. rate 16, height 5\' 5"  (1.651 m), weight 69.4 kg (153 lb), SpO2 (!) 89 %. Alert and oriented x3. RRR, Lungs clear, BS x4. Left Calf soft and non tender. L knee dressing C/D/I. No DVT signs. No signs of infection or compartment syndrome. LLE grossly neurovascularly intact.   Disposition: Discharge disposition: 03-Skilled Nursing Facility       Discharge Instructions    Call MD / Call 911   Complete by:  As directed    If you experience chest pain or shortness of breath, CALL 911 and be transported to the hospital emergency room.  If you develope a fever above 101 F, pus (white drainage) or increased drainage or redness at the wound, or calf pain, call your surgeon's office.   Constipation Prevention   Complete by:  As directed    Drink plenty of fluids.  Prune juice may be helpful.  You may use a stool softener, such as Colace (over the counter) 100 mg twice a day.  Use MiraLax (over the counter) for constipation as needed.   Diet - low sodium heart healthy   Complete by:  As directed    Discharge instructions   Complete by:  As directed    INSTRUCTIONS AFTER JOINT REPLACEMENT   Remove items at home which could result in a fall. This includes throw rugs or furniture in walking pathways ICE to the affected joint every three hours while awake for 30 minutes at a time, for at least the first 3-5  days, and then as needed for pain and swelling.  Continue to use ice for pain and swelling. You may notice swelling that will progress down to the foot and ankle.  This is normal after surgery.  Elevate your leg when you are not up walking on it.   Continue to use the breathing machine you got in the hospital (incentive spirometer) which will help keep your temperature down.  It is common for your temperature to cycle up and down following surgery, especially at night when you are not up moving around and exerting yourself.  The breathing machine keeps your lungs expanded and your  temperature down.   DIET:  As you were doing prior to hospitalization, we recommend a well-balanced diet.  DRESSING / WOUND CARE / SHOWERING  Keep the surgical dressing until follow up.  The dressing is water proof, so you can shower without any extra covering.  IF THE DRESSING FALLS OFF or the wound gets wet inside, change the dressing with sterile gauze.  Please use good hand washing techniques before changing the dressing.  Do not use any lotions or creams on the incision until instructed by your surgeon.    ACTIVITY  Increase activity slowly as tolerated, but follow the weight bearing instructions below.   No driving for 6 weeks or until further direction given by your physician.  You cannot drive while taking narcotics.  No lifting or carrying greater than 10 lbs. until further directed by your surgeon. Avoid periods of inactivity such as sitting longer than an hour when not asleep. This helps prevent blood clots.  You may return to work once you are authorized by your doctor.     WEIGHT BEARING   Weight bearing as tolerated with assist device (walker, cane, etc) as directed, use it as long as suggested by your surgeon or therapist, typically at least 4-6 weeks.   EXERCISES  Results after joint replacement surgery are often greatly improved when you follow the exercise, range of motion and muscle strengthening exercises prescribed by your doctor. Safety measures are also important to protect the joint from further injury. Any time any of these exercises cause you to have increased pain or swelling, decrease what you are doing until you are comfortable again and then slowly increase them. If you have problems or questions, call your caregiver or physical therapist for advice.   Rehabilitation is important following a joint replacement. After just a few days of immobilization, the muscles of the leg can become weakened and shrink (atrophy).  These exercises are designed to build up the  tone and strength of the thigh and leg muscles and to improve motion. Often times heat used for twenty to thirty minutes before working out will loosen up your tissues and help with improving the range of motion but do not use heat for the first two weeks following surgery (sometimes heat can increase post-operative swelling).   These exercises can be done on a training (exercise) mat, on the floor, on a table or on a bed. Use whatever works the best and is most comfortable for you.    Use music or television while you are exercising so that the exercises are a pleasant break in your day. This will make your life better with the exercises acting as a break in your routine that you can look forward to.   Perform all exercises about fifteen times, three times per day or as directed.  You should exercise both the operative leg  and the other leg as well.   Exercises include:   Quad Sets - Tighten up the muscle on the front of the thigh (Quad) and hold for 5-10 seconds.   Straight Leg Raises - With your knee straight (if you were given a brace, keep it on), lift the leg to 60 degrees, hold for 3 seconds, and slowly lower the leg.  Perform this exercise against resistance later as your leg gets stronger.  Leg Slides: Lying on your back, slowly slide your foot toward your buttocks, bending your knee up off the floor (only go as far as is comfortable). Then slowly slide your foot back down until your leg is flat on the floor again.  Angel Wings: Lying on your back spread your legs to the side as far apart as you can without causing discomfort.  Hamstring Strength:  Lying on your back, push your heel against the floor with your leg straight by tightening up the muscles of your buttocks.  Repeat, but this time bend your knee to a comfortable angle, and push your heel against the floor.  You may put a pillow under the heel to make it more comfortable if necessary.   A rehabilitation program following joint  replacement surgery can speed recovery and prevent re-injury in the future due to weakened muscles. Contact your doctor or a physical therapist for more information on knee rehabilitation.    CONSTIPATION  Constipation is defined medically as fewer than three stools per week and severe constipation as less than one stool per week.  Even if you have a regular bowel pattern at home, your normal regimen is likely to be disrupted due to multiple reasons following surgery.  Combination of anesthesia, postoperative narcotics, change in appetite and fluid intake all can affect your bowels.   YOU MUST use at least one of the following options; they are listed in order of increasing strength to get the job done.  They are all available over the counter, and you may need to use some, POSSIBLY even all of these options:    Drink plenty of fluids (prune juice may be helpful) and high fiber foods Colace 100 mg by mouth twice a day  Senokot for constipation as directed and as needed Dulcolax (bisacodyl), take with full glass of water  Miralax (polyethylene glycol) once or twice a day as needed.  If you have tried all these things and are unable to have a bowel movement in the first 3-4 days after surgery call either your surgeon or your primary doctor.    If you experience loose stools or diarrhea, hold the medications until you stool forms back up.  If your symptoms do not get better within 1 week or if they get worse, check with your doctor.  If you experience "the worst abdominal pain ever" or develop nausea or vomiting, please contact the office immediately for further recommendations for treatment.   ITCHING:  If you experience itching with your medications, try taking only a single pain pill, or even half a pain pill at a time.  You can also use Benadryl over the counter for itching or also to help with sleep.   TED HOSE STOCKINGS:  Use stockings on both legs until for at least 2 weeks or as directed by  physician office. They may be removed at night for sleeping.  MEDICATIONS:  See your medication summary on the "After Visit Summary" that nursing will review with you.  You may have some home  medications which will be placed on hold until you complete the course of blood thinner medication.  It is important for you to complete the blood thinner medication as prescribed.  PRECAUTIONS:  If you experience chest pain or shortness of breath - call 911 immediately for transfer to the hospital emergency department.   If you develop a fever greater that 101 F, purulent drainage from wound, increased redness or drainage from wound, foul odor from the wound/dressing, or calf pain - CONTACT YOUR SURGEON.                                                   FOLLOW-UP APPOINTMENTS:  If you do not already have a post-op appointment, please call the office for an appointment to be seen by your surgeon.  Guidelines for how soon to be seen are listed in your "After Visit Summary", but are typically between 1-4 weeks after surgery.  OTHER INSTRUCTIONS:   Knee Replacement:  Do not place pillow under knee, focus on keeping the knee straight while resting. CPM instructions: 0-90 degrees, 2 hours in the morning, 2 hours in the afternoon, and 2 hours in the evening. Place foam block, curve side up under heel at all times except when in CPM or when walking.  DO NOT modify, tear, cut, or change the foam block in any way.  MAKE SURE YOU:  Understand these instructions.  Get help right away if you are not doing well or get worse.    Thank you for letting us be a part of your medical care team.  It is a privilege we respect greatly.  We hope these instructions will help you stay on track for a fast and full recovery!   Increase activity slowly as tolerated   Complete by:  As directed      Allergies as of 01/09/2018      Reactions   Codeine Nausea Only   Erythromycin Itching   Penicillins Itching, Swelling, Other (See  Comments)   Has patient had a PCN reaction causing immediate rash, facial/tongue/throat swelling, SOB or lightheadedness with hypotension: Yes Has patient had a PCN reaction causing severe rash involving mucus membranes or skin necrosis: No Has patient had a PCN reaction that required hospitalization: No Has patient had a PCN reaction occurring within the last 10 years: No If all of the above answers are "NO", then may proceed with Cephalosporin use.      Medication List    TAKE these medications   acetaminophen 500 MG tablet Commonly known as:  TYLENOL Take 1,000 mg every 6 (six) hours as needed by mouth for moderate pain or headache.   Adapalene 0.3 % gel Apply 1 application daily as needed topically (for acne).   aspirin EC 325 MG tablet Take 1 tablet (325 mg total) by mouth 2 (two) times daily. What changed:  when to take this   baclofen 10 MG tablet Commonly known as:  LIORESAL Take 1 tablet (10 mg total) by mouth daily. What changed:    when to take this  reasons to take this   clonazePAM 0.5 MG tablet Commonly known as:  KLONOPIN Take 0.25 mg 2 (two) times daily by mouth.   DULoxetine 20 MG capsule Commonly known as:  CYMBALTA Take 20 mg by mouth daily.   HYDROmorphone 2 MG tablet Commonly known  as:  DILAUDID Take 1 tablet (2 mg total) by mouth every 4 (four) hours as needed for up to 7 days for severe pain.   ibuprofen 200 MG tablet Commonly known as:  ADVIL,MOTRIN Take 400 mg every 6 (six) hours as needed by mouth for headache or moderate pain.   latanoprost 0.005 % ophthalmic solution Commonly known as:  XALATAN Place 1 drop into both eyes at bedtime.   levothyroxine 50 MCG tablet Commonly known as:  SYNTHROID, LEVOTHROID Take 50 mcg at bedtime by mouth.   loratadine 10 MG tablet Commonly known as:  CLARITIN Take 10 mg by mouth daily.   nortriptyline 50 MG capsule Commonly known as:  PAMELOR Take 100 mg at bedtime by mouth.   omeprazole 40 MG  capsule Commonly known as:  PRILOSEC Take 40 mg by mouth daily before breakfast.   REXULTI 1 MG Tabs Generic drug:  Brexpiprazole Take 1 mg by mouth daily.   Vitamin D3 1000 units Caps Take 1,000 Units at bedtime by mouth.        SignedLajean Manes 01/09/2018, 8:00 AM

## 2018-01-10 NOTE — Progress Notes (Signed)
Report called to [facility] facility. Patient ready for d/c, awaiting ride to facility.

## 2018-01-10 NOTE — Progress Notes (Signed)
Physical Therapy Treatment Patient Details Name: Judy Morrison MRN: [id number] DOB: August 09, 1951 Today's Date: 01/10/2018    History of Present Illness Pt s/p L TKR and with hx of R TKR    PT Comments    POD # 4 Assisted with amb in hallway then performed some TKR TE's followed by ICE.   Follow Up Recommendations  Follow surgeon's recommendation for DC plan and follow-up therapies;SNF     Equipment Recommendations  None recommended by PT    Recommendations for Other Services       Precautions / Restrictions Precautions Precautions: Knee;Fall Restrictions Weight Bearing Restrictions: No Other Position/Activity Restrictions: WBAT    Mobility  Bed Mobility               General bed mobility comments: OOB in recliner  Transfers Overall transfer level: Needs assistance Equipment used: Rolling walker (2 wheeled) Transfers: Sit to/from Stand Sit to Stand: Min guard;Supervision         General transfer comment: for safety/balance  Ambulation/Gait Ambulation/Gait assistance: Min guard;Supervision Ambulation Distance (Feet): 65 Feet Assistive device: Rolling walker (2 wheeled) Gait Pattern/deviations: Step-through pattern;Decreased stride length;Antalgic Gait velocity: decreased   General Gait Details: cues for distance and sequence with decreased amb distance duie to increased c/o fatigue   Stairs             Wheelchair Mobility    Modified Rankin (Stroke Patients Only)       Balance                                            Cognition Arousal/Alertness: Awake/alert Behavior During Therapy: WFL for tasks assessed/performed Overall Cognitive Status: Within Functional Limits for tasks assessed                                        Exercises   Total Knee Replacement TE's 10 reps B LE ankle pumps 10 reps towel squeezes 10 reps knee presses 10 reps heel slides  10 reps SAQ's 10 reps SLR's 10 reps  ABD Followed by ICE     General Comments        Pertinent Vitals/Pain Pain Assessment: 0-10 Pain Score: 3  Pain Location: L knee Pain Descriptors / Indicators: Aching;Sore;Operative site guarding Pain Intervention(s): Repositioned;Monitored during session;Ice applied    Home Living                      Prior Function            PT Goals (current goals can now be found in the care plan section) Progress towards PT goals: Progressing toward goals    Frequency    7X/week      PT Plan Current plan remains appropriate    Co-evaluation              AM-PAC PT "6 Clicks" Daily Activity  Outcome Measure  Difficulty turning over in bed (including adjusting bedclothes, sheets and blankets)?: A Lot Difficulty moving from lying on back to sitting on the side of the bed? : A Little Difficulty sitting down on and standing up from a chair with arms (e.g., wheelchair, bedside commode, etc,.)?: A Little Help needed moving to and from a bed to chair (including a wheelchair)?: A Little Help  needed walking in hospital room?: A Little Help needed climbing 3-5 steps with a railing? : A Lot 6 Click Score: 16    End of Session Equipment Utilized During Treatment: Gait belt Activity Tolerance: Patient tolerated treatment well Patient left: in chair;with call bell/phone within reach Nurse Communication: Mobility status PT Visit Diagnosis: Difficulty in walking, not elsewhere classified (R26.2);Pain Pain - Right/Left: Left Pain - part of body: Knee     Time: 1030-1055 PT Time Calculation (min) (ACUTE ONLY): 25 min  Charges:  $Gait Training: 8-22 mins $Therapeutic Exercise: 8-22 mins                    G Codes:       Rica Koyanagi  PTA [facility]  Acute  Rehab Pager      [phone]

## 2018-03-27 ENCOUNTER — Other Ambulatory Visit: Payer: Self-pay | Admitting: Family Medicine

## 2018-03-27 DIAGNOSIS — Z1231 Encounter for screening mammogram for malignant neoplasm of breast: Secondary | ICD-10-CM

## 2018-04-20 ENCOUNTER — Other Ambulatory Visit: Payer: Self-pay

## 2018-04-20 ENCOUNTER — Encounter (HOSPITAL_COMMUNITY): Payer: Self-pay

## 2018-04-20 ENCOUNTER — Emergency Department (HOSPITAL_COMMUNITY): Payer: Medicare Other

## 2018-04-20 ENCOUNTER — Emergency Department (HOSPITAL_COMMUNITY)
Admission: EM | Admit: 2018-04-20 | Discharge: 2018-04-20 | Disposition: A | Discharge status: Home / Self Care | Payer: Medicare Other | Attending: Emergency Medicine | Admitting: Emergency Medicine

## 2018-04-20 DIAGNOSIS — Z862 Personal history of diseases of the blood and blood-forming organs and certain disorders involving the immune mechanism: Secondary | ICD-10-CM | POA: Diagnosis not present

## 2018-04-20 DIAGNOSIS — R42 Dizziness and giddiness: Secondary | ICD-10-CM | POA: Diagnosis present

## 2018-04-20 DIAGNOSIS — Z87891 Personal history of nicotine dependence: Secondary | ICD-10-CM | POA: Diagnosis not present

## 2018-04-20 DIAGNOSIS — Z96653 Presence of artificial knee joint, bilateral: Secondary | ICD-10-CM | POA: Diagnosis not present

## 2018-04-20 DIAGNOSIS — E039 Hypothyroidism, unspecified: Secondary | ICD-10-CM | POA: Insufficient documentation

## 2018-04-20 DIAGNOSIS — H539 Unspecified visual disturbance: Secondary | ICD-10-CM | POA: Insufficient documentation

## 2018-04-20 LAB — BASIC METABOLIC PANEL
Anion gap: 9 (ref 5–15)
BUN: 17 mg/dL (ref 8–23)
CHLORIDE: 100 mmol/L (ref 98–111)
CO2: 28 mmol/L (ref 22–32)
CREATININE: 0.7 mg/dL (ref 0.44–1.00)
Calcium: 8.9 mg/dL (ref 8.9–10.3)
GFR calc Af Amer: >60 mL/min (ref >60)
GFR calc non Af Amer: >60 mL/min (ref >60)
Glucose, Bld: 97 mg/dL (ref 70–99)
Potassium: 4 mmol/L (ref 3.5–5.1)
Sodium: 137 mmol/L (ref 135–145)

## 2018-04-20 LAB — URINALYSIS, ROUTINE W REFLEX MICROSCOPIC
Bacteria, UA: NONE SEEN
Bilirubin Urine: NEGATIVE
GLUCOSE, UA: NEGATIVE mg/dL
Ketones, ur: NEGATIVE mg/dL
Leukocytes, UA: NEGATIVE
Nitrite: NEGATIVE
PROTEIN: NEGATIVE mg/dL
Specific Gravity, Urine: 1.003 — ABNORMAL LOW (ref 1.005–1.030)
pH: 6 (ref 5.0–8.0)

## 2018-04-20 LAB — CBC
HCT: 33.5 % — ABNORMAL LOW (ref 36.0–46.0)
Hemoglobin: 10.8 g/dL — ABNORMAL LOW (ref 12.0–15.0)
MCH: 24.9 pg — AB (ref 26.0–34.0)
MCHC: 32.2 g/dL (ref 30.0–36.0)
MCV: 77.4 fL — AB (ref 78.0–100.0)
PLATELETS: 427 10*3/uL — AB (ref 150–400)
RBC: 4.33 MIL/uL (ref 3.87–5.11)
RDW: 14.7 % (ref 11.5–15.5)
WBC: 10.4 10*3/uL (ref 4.0–10.5)

## 2018-04-20 LAB — I-STAT TROPONIN, ED: TROPONIN I, POC: 0 ng/mL (ref 0.00–0.08)

## 2018-04-20 LAB — CBG MONITORING, ED: Glucose-Capillary: 161 mg/dL — ABNORMAL HIGH (ref 70–99)

## 2018-04-20 MED ORDER — SODIUM CHLORIDE 0.9 % IV BOLUS
1000.0000 mL | Freq: Once | INTRAVENOUS | Status: AC
  Administered 2018-04-20: 1000 mL via INTRAVENOUS

## 2018-04-20 MED ORDER — MECLIZINE HCL 12.5 MG PO TABS: 12.5000 mg | ORAL_TABLET | Freq: Three times a day (TID) | ORAL | 0 refills | Status: DC | PRN

## 2018-04-20 NOTE — ED Notes (Signed)
Patient returned from MRI.

## 2018-04-20 NOTE — ED Triage Notes (Addendum)
Pt states that every time she walks, she feels dizzy. Pt states that she is also hearing a "loud silence- electrical noise type of sound- when nothing is on". Pt is very calm and well put together. Pt experiences dizziness with positional changes. Pt states that she was feeling anxious and took 0.25 xanax prior to arrival.

## 2018-04-20 NOTE — ED Provider Notes (Signed)
[facility] DEPT Provider Note   CSN: [id number] Arrival date & time: 04/20/18  1218     History   Chief Complaint Chief Complaint  Patient presents with  . Dizziness    HPI Judy Morrison is a 67 y.o. female.  HPI   Patient is a 67 year old female history of anemia, arthritis, depression, GERD, glaucoma, UTI who presents the emergency department today for evaluation of dizziness/lightheadedness/near syncope that has been ongoing intimately for the last month.  Patient states that symptoms seem to be associated with position changes, most frequently when going from sitting to standing.  Episodes last anywhere from seconds up to 1 minute at a time.  They resolve on their own.  Intermittently has diaphoresis with the episodes but denies any shortness of breath, chest pain, palpitations, nausea, vomiting.  Does report associated changes in her hearing.  She feels like she hears "crickets" and an "electric sound ".  Denies ringing in her ears. Has had intermittent changes in vision with this and today felt like the right side of her face was "tight". No numbness to the face.  No recent URI symptoms, cough, hemoptysis, leg pain/swelling, recent surgeries, recent admissions to the hospital, recent prolonged travel, history of DVT/PE, history of cancer.  Does have history of HLD is not currently on a statin for this.  States she has been eating and drinking normally recently.  Denies any new medication changes or supplements. No HA, numbness/weakness, slurred speech.   Past Medical History:  Diagnosis Date  . Anemia   . Arthritis   . Depressed   . GERD (gastroesophageal reflux disease)    controlled with Omeprazole  . Glaucoma   . Torn rotator cuff    RIGHT   . Urinary tract infection    dx 05-26-17 took 1 week cipro BID completed 06-02-17.    Patient Active Problem List   Diagnosis Date Noted  . Primary osteoarthritis of left knee 01/06/2018  . S/P  knee replacement 08/12/2017  . Incomplete tear of right rotator cuff 05/19/2017  . Osteopenia 05/19/2017  . Urge incontinence of urine 05/19/2017  . Primary open angle glaucoma (POAG) 12/30/2016  . Primary osteoarthritis of both knees 12/30/2016  . Recurrent oral herpes simplex infection 12/30/2016  . Tubular adenoma of colon 12/30/2016  . Hypothyroidism 06/25/2016  . Right foot pain 12/19/2014  . Anxiety 05/21/2011  . Seasonal allergies 05/21/2011    Past Surgical History:  Procedure Laterality Date  . APPENDECTOMY    . arthroscopic right knee     . CHOLECYSTECTOMY    . COLONOSCOPY    . Pelvic sling     x2  . POLYPECTOMY    . TONSILLECTOMY    . TOTAL KNEE ARTHROPLASTY Right 08/12/2017   Procedure: RIGHT TOTAL KNEE ARTHROPLASTY;  Surgeon: Sydnee Cabal, MD;  Location: [facility];  Service: Orthopedics;  Laterality: Right;  . TOTAL KNEE ARTHROPLASTY Left 01/06/2018   Procedure: LEFT TOTAL KNEE ARTHROPLASTY;  Surgeon: Sydnee Cabal, MD;  Location: [facility];  Service: Orthopedics;  Laterality: Left;  . UPPER GASTROINTESTINAL ENDOSCOPY    . WISDOM TOOTH EXTRACTION       OB History   None      Home Medications    Prior to Admission medications   Medication Sig Start Date End Date Taking? Authorizing Provider  acetaminophen (TYLENOL) 500 MG tablet Take 1,000 mg every 6 (six) hours as needed by mouth for moderate pain or headache.   Yes [provider]  Adapalene 0.3 % gel Apply 1 application daily as needed topically (for acne).   Yes [provider]  ALPRAZolam (XANAX) 0.25 MG tablet Take 0.25 mg by mouth daily as needed for anxiety.  04/14/18  Yes [provider]  Brexpiprazole (REXULTI) 1 MG TABS Take 1 mg by mouth daily after breakfast.    Yes [provider]  Cholecalciferol (VITAMIN D3) 1000 units CAPS Take 1,000 Units at bedtime by mouth.   Yes [provider]  clonazePAM (KLONOPIN) 0.5 MG tablet Take 0.25 mg 2 (two) times daily  by mouth.    Yes [provider]  DULoxetine HCl 40 MG CPEP Take 40 mg by mouth daily after breakfast. 03/17/18  Yes [provider]  ibuprofen (ADVIL,MOTRIN) 200 MG tablet Take 400 mg every 6 (six) hours as needed by mouth for headache or moderate pain.    Yes [provider]  latanoprost (XALATAN) 0.005 % ophthalmic solution Place 1 drop into both eyes at bedtime.   Yes [provider]  levothyroxine (SYNTHROID, LEVOTHROID) 50 MCG tablet Take 50 mcg at bedtime by mouth.    Yes [provider]  loratadine (CLARITIN) 10 MG tablet Take 10 mg by mouth daily.   Yes [provider]  nortriptyline (PAMELOR) 50 MG capsule Take 100 mg at bedtime by mouth.  12/19/14  Yes [provider]  omeprazole (PRILOSEC) 40 MG capsule Take 40 mg by mouth daily before breakfast.   Yes [provider]  meclizine (ANTIVERT) 12.5 MG tablet Take 1 tablet (12.5 mg total) by mouth 3 (three) times daily as needed for dizziness. 04/20/18   Joffrey Kerce S, PA-C    Family History Family History  Problem Relation Age of Onset  . Colon cancer Neg Hx   . Breast cancer Neg Hx   . Esophageal cancer Neg Hx   . Pancreatic cancer Neg Hx   . Prostate cancer Neg Hx   . Rectal cancer Neg Hx   . Stomach cancer Neg Hx     Social History Social History   Tobacco Use  . Smoking status: Former Smoker    Types: Cigarettes    Last attempt to quit: 1986    Years since quitting: 33.6  . Smokeless tobacco: Never Used  Substance Use Topics  . Alcohol use: Yes    Comment: 3-4 beers monthly; 2 WINE GLASS PER MONTH   . Drug use: No     Allergies   Codeine; Erythromycin; and Penicillins   Review of Systems Review of Systems  Constitutional: Negative for fever.  HENT: Negative for congestion, postnasal drip, rhinorrhea and sore throat.   Eyes: Positive for visual disturbance (none currently).  Respiratory: Negative for cough and shortness of breath.     Cardiovascular: Negative for chest pain, palpitations and leg swelling.  Gastrointestinal: Negative for abdominal pain, constipation, diarrhea, nausea and vomiting.  Genitourinary: Negative for dysuria, flank pain, frequency, hematuria and urgency.  Musculoskeletal: Negative for back pain.       Right sided neck muscle spasms  Skin: Negative for wound.  Neurological: Positive for dizziness and light-headedness. Negative for weakness, numbness and headaches.   Physical Exam Updated Vital Signs BP 110/66 (BP Location: Left Arm)   Pulse 88   Temp 98.8 F (37.1 C) (Oral)   Resp 16   Ht 5\' 5"  (1.651 m)   Wt 68 kg   SpO2 100%   BMI 24.96 kg/m   Physical Exam  Constitutional: She is oriented  to person, place, and time. She appears well-developed and well-nourished. No distress.  HENT:  Head: Normocephalic and atraumatic.  Right Ear: External ear normal.  Left Ear: External ear normal.  Nose: Nose normal.  Mouth/Throat: Oropharynx is clear and moist.  Left TM with clear effusion.  Right TM partially serum and obstruction however appears opaque.  No erythema or bulging noted.  Eyes: Pupils are equal, round, and reactive to light. Conjunctivae and EOM are normal.  No horizontal or vertical nystagmus.  Symptoms not provoked with extraocular movement testing.  Neck: Normal range of motion. Neck supple.  Cardiovascular: Normal rate, regular rhythm, normal heart sounds and intact distal pulses.  No murmur heard. Pulmonary/Chest: Effort normal and breath sounds normal. No stridor. No respiratory distress. She has no wheezes.  Abdominal: Soft. Bowel sounds are normal. There is no tenderness.  Musculoskeletal: She exhibits no edema.  No calf TTP, erythema, swelling.  Neurological: She is alert and oriented to person, place, and time.  Mental Status:  Alert, thought content appropriate, able to give a coherent history. Speech fluent without evidence of aphasia. Able to follow 2 step commands  without difficulty.  Cranial Nerves:  II:   pupils equal, round, reactive to light III,IV, VI: ptosis not present, extra-ocular motions intact bilaterally  V,VII: smile symmetric, facial light touch sensation equal (states right side of face "feels tight", no numbness/paresthesia) VIII: hearing grossly normal to voice  X: uvula elevates symmetrically  XI: bilateral shoulder shrug symmetric and strong XII: midline tongue extension without fassiculations Motor:  Normal tone. 5/5 strength of BUE and BLE major muscle groups including strong and equal grip strength and dorsiflexion/plantar flexion Sensory: light touch normal in all extremities. Cerebellar: normal finger-to-nose with bilateral upper extremities, normal heel to shin Gait: normal gait and balance. No ataxia CV: 2+ radial and DP pulses Negative pronator drift.  Negative Romberg testing.  Skin: Skin is warm and dry.  Psychiatric: She has a normal mood and affect.  Nursing note and vitals reviewed.  ED Treatments / Results  Labs (all labs ordered are listed, but only abnormal results are displayed) Labs Reviewed  CBC - Abnormal; Notable for the following components:      Result Value   Hemoglobin 10.8 (*)    HCT 33.5 (*)    MCV 77.4 (*)    MCH 24.9 (*)    Platelets 427 (*)    All other components within normal limits  URINALYSIS, ROUTINE W REFLEX MICROSCOPIC - Abnormal; Notable for the following components:   Specific Gravity, Urine 1.003 (*)    Hgb urine dipstick SMALL (*)    All other components within normal limits  CBG MONITORING, ED - Abnormal; Notable for the following components:   Glucose-Capillary 161 (*)    All other components within normal limits  BASIC METABOLIC PANEL  I-STAT TROPONIN, ED    EKG EKG Interpretation  Date/Time:  Thursday April 20 2018 12:42:42 EDT Ventricular Rate:  95 PR Interval:    QRS Duration: 102 QT Interval:  330 QTC Calculation: 415 R Axis:   62 Text Interpretation:   Sinus rhythm Abnormal T, consider ischemia, lateral leads Minimal ST elevation, anterior leads Baseline wander in lead(s) II III aVL aVF Confirmed by Julianne Rice [phone]) on 04/20/2018 1:04:54 PM   Radiology Mr Brain Wo Contrast  Result Date: 04/20/2018 CLINICAL DATA:  Patient states that every time she walks, she feels dizzy. Patient also reports hearing sounds in the ear, which are not really present. EXAM:  MRI HEAD WITHOUT CONTRAST TECHNIQUE: Multiplanar, multiecho pulse sequences of the brain and surrounding structures were obtained without intravenous contrast. COMPARISON:  None. FINDINGS: Brain: No evidence for acute infarction, hemorrhage, mass lesion, hydrocephalus, or extra-axial fluid. Mild cerebral and cerebellar atrophy. Mild to moderate subcortical and periventricular T2 and FLAIR hyperintensities, likely chronic microvascular ischemic change. Vascular: Flow voids are maintained throughout the carotid, basilar, and BILATERAL vertebral arteries. There are no areas of chronic hemorrhage. There is moderate dolichoectasia. Skull and upper cervical spine: Unremarkable visualized calvarium, skullbase, and cervical vertebrae. Pituitary, pineal, cerebellar tonsils unremarkable. No upper cervical cord lesions. Sinuses/Orbits: No orbital masses or proptosis. Globes appear symmetric. Sinuses appear well aerated, without evidence for air-fluid level. Other: No nasopharyngeal pathology or mastoid fluid. Scalp and other visualized extracranial soft tissues grossly unremarkable. IMPRESSION: Atrophy and small vessel disease.  No acute intracranial findings. Electronically Signed   By: Staci Righter M.D.   On: 04/20/2018 15:52    Procedures Procedures (including critical care time)  Medications Ordered in ED Medications  sodium chloride 0.9 % bolus 1,000 mL (0 mLs Intravenous Stopped 04/20/18 1637)     Initial Impression / Assessment and Plan / ED Course  I have reviewed the triage vital signs and the  nursing notes.  Pertinent labs & imaging results that were available during my care of the patient were reviewed by me and considered in my medical decision making (see chart for details).    Discussed pt presentation and exam findings with Dr. Lita Mains, who personally evaluated the pt and recommended MRI brain. States that if MRI brain negative then we may trial pt on meclizine and have her f/o with ENT. She should also f/u with cardiology in regards to her ECG.   Final Clinical Impressions(s) / ED Diagnoses   Final diagnoses:  Vertigo   Pt presenting to the ED today c/o a 1 month h/o intermittent dizziness/lightheadedness/near syncope that is associated with going from sitting to standing position.  VS WNL. Afebrile. Orthostatic VS are positive.   ECG with normal sinus rhythm.  Abnormal T waves noted in lateral leads as well as minimal ST elevation to anterior leads.  Baseline wander leads in 2 3 aVL and aVF.  Given patient does not have chest pain shortness of breath or other cardiac complaints, feel that she is appropriate for outpt f/u in regard to these ECG changes.   CBC with mild anemia, compared to previous this is stable.  BM P with normal electrolytes.  Troponin negative. UA without evidence of UTI    Sinus rhythm Abnormal T, consider ischemia, lateral leads Minimal ST elevation, anterior leads Baseline wander in lead(s) II III aVL aVF Confirmed by Julianne Rice [phone]) on 04/20/2018 1:04:54 PM  Lower suspicion for CVA or other central cause of vertigo as pt has not horizontal or bilateral nystagmus. Negative romberg, no ataxia, and nonfocal neuro exam. Also sxs are transient in nature lasting up to one minute at a time and are associated with positional changes.  MRI brain showed some chronic small vessel ischemia but no acute changes or findings to suggest CVA.  Confirms the suspicion that patient is having peripheral cause of vertigo.  Discussed findings with patient and family  at bedside and offered a dose of meclizine in the ED however patient declined.  She prefers to have Rx for this at home.  Feel that this is reasonable if her symptoms have resolved.  We will give her ENT follow-up.  Will also give cardiology  follow-up given her nonspecific changes found on ECG today.  Have advised her to follow-up with PCP in 1 week for reevaluation and return to the ER for any new or worsening symptoms in the meantime.  Patient and family at bedside voiced understanding of plan reasons to return immediately to the ED.  All questions answered.  ED Discharge Orders         Ordered    meclizine (ANTIVERT) 12.5 MG tablet  3 times daily PRN,   Status:  Discontinued     04/20/18 1625    meclizine (ANTIVERT) 12.5 MG tablet  3 times daily PRN     04/20/18 1626           Yonas Bunda S, PA-C 04/20/18 1646    Julianne Rice, MD 04/24/18 [phone]

## 2018-04-20 NOTE — Discharge Instructions (Addendum)
Please take medications as prescribed.  Be aware that this medication can make you drowsy.  Do not drive, work or operate machine while you are taking this medication.  Please follow up with your primary doctor in 1 week.  Please call the cardiology office to make an appointment for follow-up.  Please call the ear nose and throat doctor to make an appointment for follow-up as well.  If you have any new or worsening symptoms in the meantime you will need to return to the emergency department immediately.

## 2018-04-20 NOTE — ED Notes (Signed)
Patient to MRI at this time.

## 2018-04-20 NOTE — ED Notes (Signed)
Patient verbalized understanding of discharge instructions, no questions. Patent ambulated out of ED with steady gait in no distress.

## 2018-04-20 NOTE — ED Notes (Signed)
Upon performing orthostatic vitals, pt became dizzy upon standing

## 2018-04-26 ENCOUNTER — Ambulatory Visit
Admission: RE | Admit: 2018-04-26 | Discharge: 2018-04-26 | Disposition: A | Discharge status: Home / Self Care | Payer: Medicare Other | Source: Ambulatory Visit | Attending: Family Medicine | Admitting: Family Medicine

## 2018-04-26 DIAGNOSIS — Z1231 Encounter for screening mammogram for malignant neoplasm of breast: Secondary | ICD-10-CM

## 2018-05-10 ENCOUNTER — Encounter: Payer: Self-pay | Admitting: Cardiovascular Disease

## 2018-05-10 ENCOUNTER — Ambulatory Visit (INDEPENDENT_AMBULATORY_CARE_PROVIDER_SITE_OTHER): Payer: Medicare Other | Admitting: Cardiovascular Disease

## 2018-05-10 DIAGNOSIS — R42 Dizziness and giddiness: Secondary | ICD-10-CM

## 2018-05-10 DIAGNOSIS — R0789 Other chest pain: Secondary | ICD-10-CM | POA: Diagnosis not present

## 2018-05-10 HISTORY — DX: Other chest pain: R07.89

## 2018-05-10 HISTORY — DX: Dizziness and giddiness: R42

## 2018-05-10 NOTE — Progress Notes (Signed)
05/10/2018 [place]   11/16/1950  [id number]  Primary Physician Jeri Cos, MD Primary Cardiologist: Lorretta Harp MD Lupe Carney, [place]  HPI:  LARAH KUNTZMAN is a 67 y.o.   moderately overweight divorced Caucasian female mother of 3 children, grandmother of 5 grandchildren who is retired from [employer] where she worked for 29 years.  Her primary care provider is Dr. Brayton El at [facility] in [place].  She was referred by the emergency room because of presyncope.  She has no cardiac risk factors.  She is never had a heart attack or stroke.  She has had 2 episodes of left arm and jaw pain several months ago which has not recurred.  She was seen in the emergency room for dizziness and presyncope on 04/20/2018 without etiology found at work-up.  She had negative MRI.  Her lab work was otherwise unremarkable.  She is had no recurrent symptoms.  She has seen an ENT doctor who found nothing as well.    Current Meds  Medication Sig  . acetaminophen (TYLENOL) 500 MG tablet Take 1,000 mg every 6 (six) hours as needed by mouth for moderate pain or headache.  . Adapalene 0.3 % gel Apply 1 application daily as needed topically (for acne).  . ALPRAZolam (XANAX) 0.25 MG tablet Take 0.25 mg by mouth daily as needed for anxiety.   . Brexpiprazole (REXULTI) 1 MG TABS Take 1 mg by mouth daily after breakfast.   . Cholecalciferol (VITAMIN D3) 1000 units CAPS Take 1,000 Units at bedtime by mouth.  . clonazePAM (KLONOPIN) 0.5 MG tablet Take 0.25 mg 2 (two) times daily by mouth.   . DULoxetine HCl 40 MG CPEP Take 40 mg by mouth daily after breakfast.  . ibuprofen (ADVIL,MOTRIN) 200 MG tablet Take 400 mg every 6 (six) hours as needed by mouth for headache or moderate pain.   Marland Kitchen latanoprost (XALATAN) 0.005 % ophthalmic solution Place 1 drop into both eyes at bedtime.  Marland Kitchen levothyroxine (SYNTHROID, LEVOTHROID) 50 MCG tablet Take 50 mcg at bedtime by mouth.   . loratadine (CLARITIN) 10 MG  tablet Take 10 mg by mouth daily.  . meclizine (ANTIVERT) 12.5 MG tablet Take 1 tablet (12.5 mg total) by mouth 3 (three) times daily as needed for dizziness.  . nortriptyline (PAMELOR) 50 MG capsule Take 100 mg at bedtime by mouth.   Marland Kitchen omeprazole (PRILOSEC) 40 MG capsule Take 40 mg by mouth daily before breakfast.     Allergies  Allergen Reactions  . Codeine Nausea Only  . Erythromycin Itching  . Penicillins Itching, Swelling and Other (See Comments)    Has patient had a PCN reaction causing immediate rash, facial/tongue/throat swelling, SOB or lightheadedness with hypotension: Yes Has patient had a PCN reaction causing severe rash involving mucus membranes or skin necrosis: No Has patient had a PCN reaction that required hospitalization: No Has patient had a PCN reaction occurring within the last 10 years: No If all of the above answers are "NO", then may proceed with Cephalosporin use.     Social History   Socioeconomic History  . Marital status: Single    Spouse name: Not on file  . Number of children: Not on file  . Years of education: Not on file  . Highest education level: Not on file  Occupational History  . Not on file  Social Needs  . Financial resource strain: Not on file  . Food insecurity:    Worry: Not on  file    Inability: Not on file  . Transportation needs:    Medical: Not on file    Non-medical: Not on file  Tobacco Use  . Smoking status: Former Smoker    Types: Cigarettes    Last attempt to quit: 1986    Years since quitting: 33.6  . Smokeless tobacco: Never Used  Substance and Sexual Activity  . Alcohol use: Yes    Comment: 3-4 beers monthly; 2 [place] PER MONTH   . Drug use: No  . Sexual activity: Not on file  Lifestyle  . Physical activity:    Days per week: Not on file    Minutes per session: Not on file  . Stress: Not on file  Relationships  . Social connections:    Talks on phone: Not on file    Gets together: Not on file    Attends  religious service: Not on file    Active member of club or organization: Not on file    Attends meetings of clubs or organizations: Not on file    Relationship status: Not on file  . Intimate partner violence:    Fear of current or ex partner: Not on file    Emotionally abused: Not on file    Physically abused: Not on file    Forced sexual activity: Not on file  Other Topics Concern  . Not on file  Social History Narrative  . Not on file     Review of Systems: General: negative for chills, fever, night sweats or weight changes.  Cardiovascular: negative for chest pain, dyspnea on exertion, edema, orthopnea, palpitations, paroxysmal nocturnal dyspnea or shortness of breath Dermatological: negative for rash Respiratory: negative for cough or wheezing Urologic: negative for hematuria Abdominal: negative for nausea, vomiting, diarrhea, bright red blood per rectum, melena, or hematemesis Neurologic: negative for visual changes, syncope, or dizziness All other systems reviewed and are otherwise negative except as noted above.    Blood pressure 126/76, pulse 99, height 5\' 5"  (1.651 m), weight 153 lb 9.6 oz (69.7 kg).  General appearance: alert and no distress Neck: no adenopathy, no carotid bruit, no JVD, supple, symmetrical, trachea midline and thyroid not enlarged, symmetric, no tenderness/mass/nodules Lungs: clear to auscultation bilaterally Heart: regular rate and rhythm, S1, S2 normal, no murmur, click, rub or gallop Extremities: extremities normal, atraumatic, no cyanosis or edema Pulses: 2+ and symmetric Skin: Skin color, texture, turgor normal. No rashes or lesions Neurologic: Alert and oriented X 3, normal strength and tone. Normal symmetric reflexes. Normal coordination and gait  EKG not performed today  ASSESSMENT AND PLAN:   Dizziness Ms. Wissner was referred by the ER because of a month of dizziness and presyncope.  This has since resolved spontaneously.  She was  given meclizine which she has not taken and is seen an ENT doctor which was unrevealing.  At this point, I do not think further work-up is required.  Atypical chest pain History of atypical chest pain several months ago that occurred twice with left arm discomfort rating to her jaw.  She has had no recurrence.  She has no no cardiac risk factors.  If she has recurrent symptoms she will require functional testing.      Lorretta Harp MD FACP,FACC,FAHA, [facility] 05/10/2018 11:58 AM

## 2018-05-10 NOTE — Assessment & Plan Note (Signed)
History of atypical chest pain several months ago that occurred twice with left arm discomfort rating to her jaw.  She has had no recurrence.  She has no no cardiac risk factors.  If she has recurrent symptoms she will require functional testing.

## 2018-05-10 NOTE — Patient Instructions (Signed)

## 2018-05-10 NOTE — Assessment & Plan Note (Signed)
Judy Morrison was referred by the ER because of a month of dizziness and presyncope.  This has since resolved spontaneously.  She was given meclizine which she has not taken and is seen an ENT doctor which was unrevealing.  At this point, I do not think further work-up is required.

## 2019-01-03 ENCOUNTER — Telehealth: Payer: Self-pay | Admitting: Cardiovascular Disease

## 2019-01-03 NOTE — Telephone Encounter (Signed)
Mychart pending, smartphone, pre reg complete 01/03/19 [facility]

## 2019-01-04 ENCOUNTER — Encounter: Payer: Self-pay | Admitting: *Deleted

## 2019-01-04 ENCOUNTER — Telehealth: Payer: Self-pay | Admitting: *Deleted

## 2019-01-04 NOTE — Telephone Encounter (Signed)
Virtual Visit Pre-Appointment Phone Call  "(Name), I am calling you today to discuss your upcoming appointment. We are currently trying to limit exposure to the virus that causes COVID-19 by seeing patients at home rather than in the office."  1. "What is the BEST phone number to call the day of the visit?" - include this in appointment notes  2. Do you have or have access to (through a family member/friend) a smartphone with video capability that we can use for your visit?" a. If yes - list this number in appt notes as cell (if different from BEST phone #) and list the appointment type as a VIDEO visit in appointment notes b. If no - list the appointment type as a PHONE visit in appointment notes  3. Confirm consent - "In the setting of the current Covid19 crisis, you are scheduled for a (phone or video) visit with your provider on (date) at (time).  Just as we do with many in-office visits, in order for you to participate in this visit, we must obtain consent.  If you'd like, I can send this to your mychart (if signed up) or email for you to review.  Otherwise, I can obtain your verbal consent now.  All virtual visits are billed to your insurance company just like a normal visit would be.  By agreeing to a virtual visit, we'd like you to understand that the technology does not allow for your provider to perform an examination, and thus may limit your provider's ability to fully assess your condition. If your provider identifies any concerns that need to be evaluated in person, we will make arrangements to do so.  Finally, though the technology is pretty good, we cannot assure that it will always work on either your or our end, and in the setting of a video visit, we may have to convert it to a phone-only visit.  In either situation, we cannot ensure that we have a secure connection.  Are you willing to proceed?" STAFF: Did the patient verbally acknowledge consent to telehealth visit? Document  YES/NO here: YES  4. Advise patient to be prepared - "Two hours prior to your appointment, go ahead and check your blood pressure, pulse, oxygen saturation, and your weight (if you have the equipment to check those) and write them all down. When your visit starts, your provider will ask you for this information. If you have an Apple Watch or Kardia device, please plan to have heart rate information ready on the day of your appointment. Please have a pen and paper handy nearby the day of the visit as well."  5. Give patient instructions for MyChart download to smartphone OR Doximity/Doxy.me as below if video visit (depending on what platform provider is using)  6. Inform patient they will receive a phone call 15 minutes prior to their appointment time (may be from unknown caller ID) so they should be prepared to answer    Judy Morrison has been deemed a candidate for a follow-up tele-health visit to limit community exposure during the Covid-19 pandemic. I spoke with the patient via phone to ensure availability of phone/video source, confirm preferred email & phone number, and discuss instructions and expectations.  I reminded Judy Morrison to be prepared with any vital sign and/or heart rhythm information that could potentially be obtained via home monitoring, at the time of her visit. I reminded Judy Morrison to expect a phone call prior to  her visit.  Judy Morrison, CMA 01/04/2019 1:44 PM   INSTRUCTIONS FOR DOWNLOADING THE MYCHART APP TO SMARTPHONE  - The patient must first make sure to have activated MyChart and know their login information - If Apple, go to [employer] and type in MyChart in the search bar and download the app. If Android, ask patient to go to [employer] and type in White in the search bar and download the app. The app is free but as with any other app downloads, their phone may require them to verify saved payment  information or Apple/Android password.  - The patient will need to then log into the app with their MyChart username and password, and select [facility] as their healthcare provider to link the account. When it is time for your visit, go to the MyChart app, find appointments, and click Begin Video Visit. Be sure to Select Allow for your device to access the Microphone and Camera for your visit. You will then be connected, and your provider will be with you shortly.  **If they have any issues connecting, or need assistance please contact MyChart service desk [phone])**  **If using a computer, in order to ensure the best quality for their visit they will need to use either of the following Internet Browsers: [employer], or Google Chrome**  IF USING DOXIMITY or DOXY.ME - The patient will receive a link just prior to their visit by text.     FULL LENGTH CONSENT FOR TELE-HEALTH VISIT   I hereby voluntarily request, consent and authorize [facility] and its employed or contracted physicians, physician assistants, nurse practitioners or other licensed health care professionals (the Practitioner), to provide me with telemedicine health care services (the Services") as deemed necessary by the treating Practitioner. I acknowledge and consent to receive the Services by the Practitioner via telemedicine. I understand that the telemedicine visit will involve communicating with the Practitioner through live audiovisual communication technology and the disclosure of certain medical information by electronic transmission. I acknowledge that I have been given the opportunity to request an in-person assessment or other available alternative prior to the telemedicine visit and am voluntarily participating in the telemedicine visit.  I understand that I have the right to withhold or withdraw my consent to the use of telemedicine in the course of my care at any time, without affecting my right  to future care or treatment, and that the Practitioner or I may terminate the telemedicine visit at any time. I understand that I have the right to inspect all information obtained and/or recorded in the course of the telemedicine visit and may receive copies of available information for a reasonable fee.  I understand that some of the potential risks of receiving the Services via telemedicine include:   Delay or interruption in medical evaluation due to technological equipment failure or disruption;  Information transmitted may not be sufficient (e.g. poor resolution of images) to allow for appropriate medical decision making by the Practitioner; and/or   In rare instances, security protocols could fail, causing a breach of personal health information.  Furthermore, I acknowledge that it is my responsibility to provide information about my medical history, conditions and care that is complete and accurate to the best of my ability. I acknowledge that Practitioner's advice, recommendations, and/or decision may be based on factors not within their control, such as incomplete or inaccurate data provided by me or distortions of diagnostic images or specimens that may result from electronic transmissions.  I understand that the practice of medicine is not an exact science and that Practitioner makes no warranties or guarantees regarding treatment outcomes. I acknowledge that I will receive a copy of this consent concurrently upon execution via email to the email address I last provided but may also request a printed copy by calling the office of [facility].    I understand that my insurance will be billed for this visit.   I have read or had this consent read to me.  I understand the contents of this consent, which adequately explains the benefits and risks of the Services being provided via telemedicine.   I have been provided ample opportunity to ask questions regarding this consent and the Services  and have had my questions answered to my satisfaction.  I give my informed consent for the services to be provided through the use of telemedicine in my medical care  By participating in this telemedicine visit I agree to the above.       Cardiac Questionnaire:    Since your last visit or hospitalization:    1. Have you been having new or worsening chest pain? NO   2. Have you been having new or worsening shortness of breath? NO   3. Have you been having new or worsening leg swelling, wt gain, or increase in abdominal girth (pants   fitting more tightly)? NO   4. Have you had any passing out spells? NO    *A YES to any of these questions would result in the appointment being kept. *If all the answers to these questions are NO, we should indicate that given the current situation regarding the worldwide coronarvirus pandemic, at the recommendation of the CDC, we are looking to limit gatherings in our waiting area, and thus will reschedule their appointment beyond four weeks from today.   _____________   COVID-19 Pre-Screening Questions:   Do you currently have a fever? NO  Have you recently travelled on a cruise, internationally, or to [place], [place], [place], [place], [place], or [place], [place] [employer]) ? NO  Have you been in contact with someone that is currently pending confirmation of Covid19 testing or has been confirmed to have the [place] virus?  NO  Are you currently experiencing fatigue or cough? NO      Spoke with patient and reviewed her medications, allergies, history, and pharmacy.

## 2019-01-05 ENCOUNTER — Telehealth: Payer: Self-pay

## 2019-01-05 ENCOUNTER — Telehealth (INDEPENDENT_AMBULATORY_CARE_PROVIDER_SITE_OTHER): Payer: Medicare Other | Admitting: Cardiovascular Disease

## 2019-01-05 DIAGNOSIS — E782 Mixed hyperlipidemia: Secondary | ICD-10-CM | POA: Diagnosis not present

## 2019-01-05 DIAGNOSIS — R42 Dizziness and giddiness: Secondary | ICD-10-CM | POA: Diagnosis not present

## 2019-01-05 DIAGNOSIS — R0789 Other chest pain: Secondary | ICD-10-CM

## 2019-01-05 NOTE — Telephone Encounter (Signed)
LVM stating that pt can f/u prn and AVS will be sent to address on file

## 2019-01-05 NOTE — Patient Instructions (Signed)
Medication Instructions:  Your physician recommends that you continue on your current medications as directed. Please refer to the Current Medication list given to you today.  If you need a refill on your cardiac medications before your next appointment, please call your pharmacy.   Lab work: NONE If you have labs (blood work) drawn today and your tests are completely normal, you will receive your results only by: . MyChart Message (if you have MyChart) OR . A paper copy in the mail If you have any lab test that is abnormal or we need to change your treatment, we will call you to review the results.  Testing/Procedures: NONE  Follow-Up: At [facility], you and your health needs are our priority.  As part of our continuing mission to provide you with exceptional heart care, we have created designated Provider Care Teams.  These Care Teams include your primary Cardiologist (physician) and Advanced Practice Providers (APPs -  Physician Assistants and Nurse Practitioners) who all work together to provide you with the care you need, when you need it. . You may schedule a follow up appointment AS NEEDED. You may see Dr. Berry or one of the following Advanced Practice Providers on your designated Care Team:   . Luke Kilroy, PA-C . Hao Meng, PA-C . Angela Duke, PA-C . Kathryn Lawrence, DNP . Rhonda Barrett, PA-C . Krista Kroeger, PA-C . Callie Goodrich, PA-C    

## 2019-01-05 NOTE — Progress Notes (Signed)
Virtual Visit via Video Note   This visit type was conducted due to national recommendations for restrictions regarding the COVID-19 Pandemic (e.g. social distancing) in an effort to limit this patient's exposure and mitigate transmission in our community.  Due to her co-morbid illnesses, this patient is at least at moderate risk for complications without adequate follow up.  This format is felt to be most appropriate for this patient at this time.  All issues noted in this document were discussed and addressed.  A limited physical exam was performed with this format.  Please refer to the patient's chart for her consent to telehealth for [facility].   Evaluation Performed:  Follow-up visit  Date:  01/05/2019   ID:  Judy Morrison, DOB [birth year], MRN [id number]  Patient Location: Home Provider Location: Home  PCP:  Judy Cos, MD  Cardiologist: Dr. Quay Morrison Electrophysiologist:  None   Chief Complaint: Presyncope  History of Present Illness:    Judy Morrison is a 68 y.o.   moderately overweight divorced Caucasian female mother of 3 children, grandmother of 5 grandchildren who is retired from [employer] where she worked for 29 years.  One daughter lives in [place], a son lives in [place] and a daughter lives in [place]. Her primary care provider is Dr. Brayton Morrison at [facility] in [place].  She was referred by the emergency room because of presyncope.  She has no cardiac risk factors.  She is never had a heart attack or stroke.  She has had 2 episodes of left arm and jaw pain several months ago which has not recurred.  She was seen in the emergency room for dizziness and presyncope on 04/20/2018 without etiology found at work-up.  She had negative MRI.  Her lab work was otherwise unremarkable.   She has seen an ENT doctor who found nothing as well.  Since I saw her back in August 2019 she is remained stable.  She is had  no recurrent symptoms of presyncope nor has she had any chest or arm pain.  Her PCP did note that she was mildly hyperlipidemic and began her on low-dose Crestor.     The patient does not have symptoms concerning for COVID-19 infection (fever, chills, cough, or new shortness of breath).    Past Medical History:  Diagnosis Date  . Anemia   . Arthritis   . Depressed   . GERD (gastroesophageal reflux disease)    controlled with Omeprazole  . Glaucoma   . Torn rotator cuff    RIGHT   . Urinary tract infection    dx 05-26-17 took 1 week cipro BID completed 06-02-17.   Past Surgical History:  Procedure Laterality Date  . APPENDECTOMY    . arthroscopic right knee     . CHOLECYSTECTOMY    . COLONOSCOPY    . Pelvic sling     x2  . POLYPECTOMY    . TONSILLECTOMY    . 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 TOTAL KNEE ARTHROPLASTY Left 01/06/2018   Procedure: LEFT TOTAL KNEE ARTHROPLASTY;  Surgeon: Judy Cabal, MD;  Location: [facility];  Service: Orthopedics;  Laterality: Left;  . UPPER GASTROINTESTINAL ENDOSCOPY    . WISDOM TOOTH EXTRACTION       Current Meds  Medication Sig  . acetaminophen (TYLENOL) 500 MG tablet  Take 1,000 mg every 6 (six) hours as needed by mouth for moderate pain or headache.  . Adapalene 0.3 % gel Apply 1 application daily as needed topically (for acne).  . ALPRAZolam (XANAX) 0.25 MG tablet Take 0.25 mg by mouth daily as needed for anxiety.   . Brexpiprazole (REXULTI) 1 MG TABS Take 1 mg by mouth daily after breakfast.   . Cholecalciferol (VITAMIN D3) 1000 units CAPS Take 1,000 Units at bedtime by mouth.  . clonazePAM (KLONOPIN) 0.5 MG tablet Take 0.25 mg 2 (two) times daily by mouth.   . DULoxetine HCl 40 MG CPEP Take 40 mg by mouth daily after breakfast.  . ibuprofen (ADVIL,MOTRIN) 200 MG tablet Take 400 mg every 6  (six) hours as needed by mouth for headache or moderate pain.   Marland Kitchen latanoprost (XALATAN) 0.005 % ophthalmic solution Place 1 drop into both eyes at bedtime.  Marland Kitchen levothyroxine (SYNTHROID, LEVOTHROID) 50 MCG tablet Take 50 mcg at bedtime by mouth.   . loratadine (CLARITIN) 10 MG tablet Take 10 mg by mouth daily.  . nortriptyline (PAMELOR) 50 MG capsule Take 100 mg at bedtime by mouth.   Marland Kitchen omeprazole (PRILOSEC) 40 MG capsule Take 40 mg by mouth daily before breakfast.     Allergies:   Codeine; Erythromycin; and Penicillins   Social History   Tobacco Use  . Smoking status: Former Smoker    Types: Cigarettes    Last attempt to quit: 1986    Years since quitting: 34.3  . Smokeless tobacco: Never Used  Substance Use Topics  . Alcohol use: Yes    Comment: 3-4 beers monthly; 2 WINE GLASS PER MONTH   . Drug use: No     Family Hx: The patient's family history is negative for Colon cancer, Breast cancer, Esophageal cancer, Pancreatic cancer, Prostate cancer, Rectal cancer, and Stomach cancer.  ROS:   Please see the history of present illness.     All other systems reviewed and are negative.   Prior CV studies:   The following studies were reviewed today:  None  Labs/Other Tests and Data Reviewed:    EKG:  No ECG reviewed.  Recent Labs: 04/20/2018: BUN 17; Creatinine, Ser 0.70; Hemoglobin 10.8; Platelets 427; Potassium 4.0; Sodium 137   Recent Lipid Panel No results found for: CHOL, TRIG, HDL, CHOLHDL, LDLCALC, LDLDIRECT  Wt Readings from Last 3 Encounters:  01/05/19 157 lb (71.2 kg)  05/10/18 153 lb 9.6 oz (69.7 kg)  04/20/18 150 lb (68 kg)     Objective:    Vital Signs:  BP 119/77   Pulse 94   Ht 5\' 5"  (1.651 m)   Wt 157 lb (71.2 kg)   BMI 26.13 kg/m    VITAL SIGNS:  reviewed GEN:  no acute distress RESPIRATORY:  normal respiratory effort, symmetric expansion NEURO:  alert and oriented x 3, no obvious focal deficit PSYCH:  normal affect  ASSESSMENT & PLAN:     1. Presyncope- history of presyncope without recurrence, negative ENT work-up 2. Hyperlipidemia-recently documented by PCP and started on low-dose Crestor  COVID-19 Education: The signs and symptoms of COVID-19 were discussed with the patient and how to seek care for testing (follow up with PCP or arrange E-visit).  The importance of social distancing was discussed today.  Time:   Today, I have spent 7 minutes with the patient with telehealth technology discussing the above problems.     Medication Adjustments/Labs and Tests Ordered: Current medicines are reviewed at length with the  patient today.  Concerns regarding medicines are outlined above.   Tests Ordered: No orders of the defined types were placed in this encounter.   Medication Changes: No orders of the defined types were placed in this encounter.   Disposition:  Follow up prn  Signed, Judy Burow, MD  01/05/2019 2:23 PM    [place]

## 2019-02-16 ENCOUNTER — Ambulatory Visit: Payer: Medicare Other | Admitting: Cardiovascular Disease

## 2019-03-29 ENCOUNTER — Other Ambulatory Visit: Payer: Self-pay | Admitting: Family Medicine

## 2019-03-29 DIAGNOSIS — Z1231 Encounter for screening mammogram for malignant neoplasm of breast: Secondary | ICD-10-CM

## 2019-05-11 ENCOUNTER — Other Ambulatory Visit: Payer: Self-pay

## 2019-05-11 ENCOUNTER — Ambulatory Visit
Admission: RE | Admit: 2019-05-11 | Discharge: 2019-05-11 | Disposition: A | Discharge status: Home / Self Care | Payer: Medicare Other | Source: Ambulatory Visit | Attending: Family Medicine | Admitting: Family Medicine

## 2019-05-11 DIAGNOSIS — Z1231 Encounter for screening mammogram for malignant neoplasm of breast: Secondary | ICD-10-CM

## 2019-07-06 IMAGING — MG DIGITAL SCREENING BILATERAL MAMMOGRAM WITH TOMO AND CAD
8 series · 9 of 24 positions shown · non-contrast
Comparison: Previous exam(s).

CLINICAL DATA: Screening.

EXAM:
DIGITAL SCREENING BILATERAL MAMMOGRAM WITH TOMO AND CAD

[R CC synth-2D]
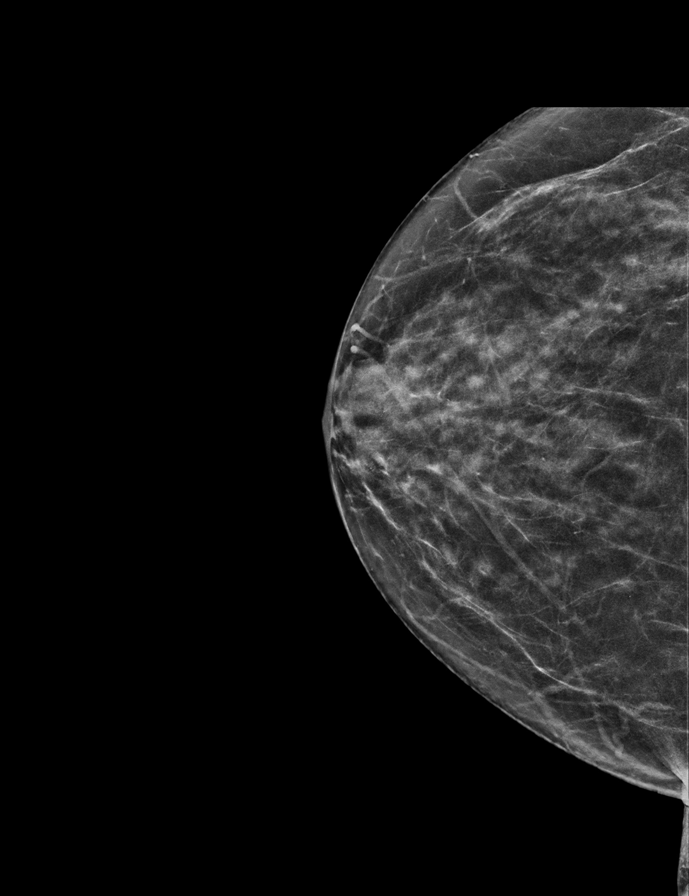

[L CC synth-2D]
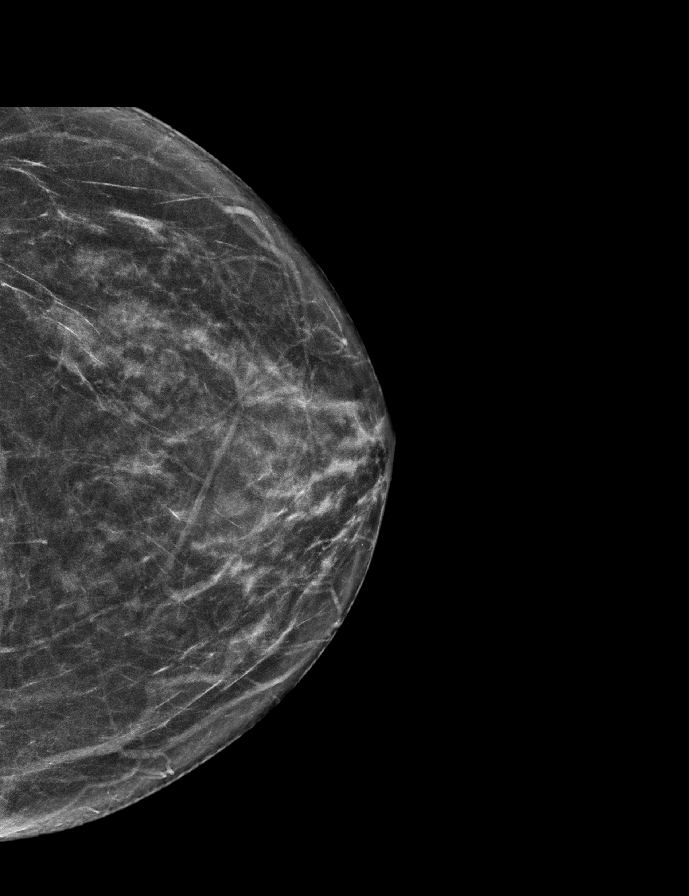

[R MLO synth-2D]
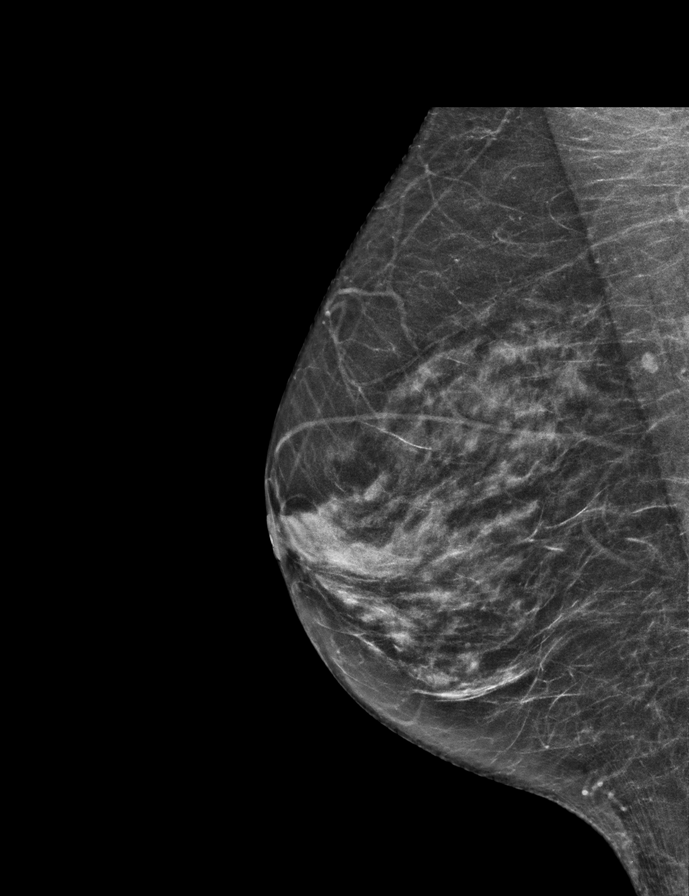

[L MLO synth-2D]
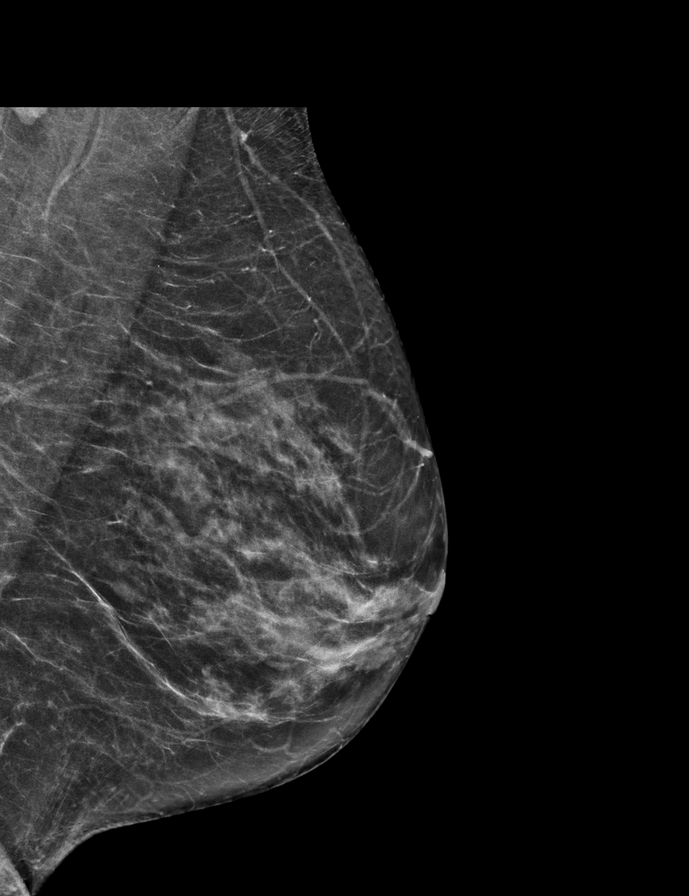

[R CC tomo · 2 of 58 frames shown]
[frame 19/58]
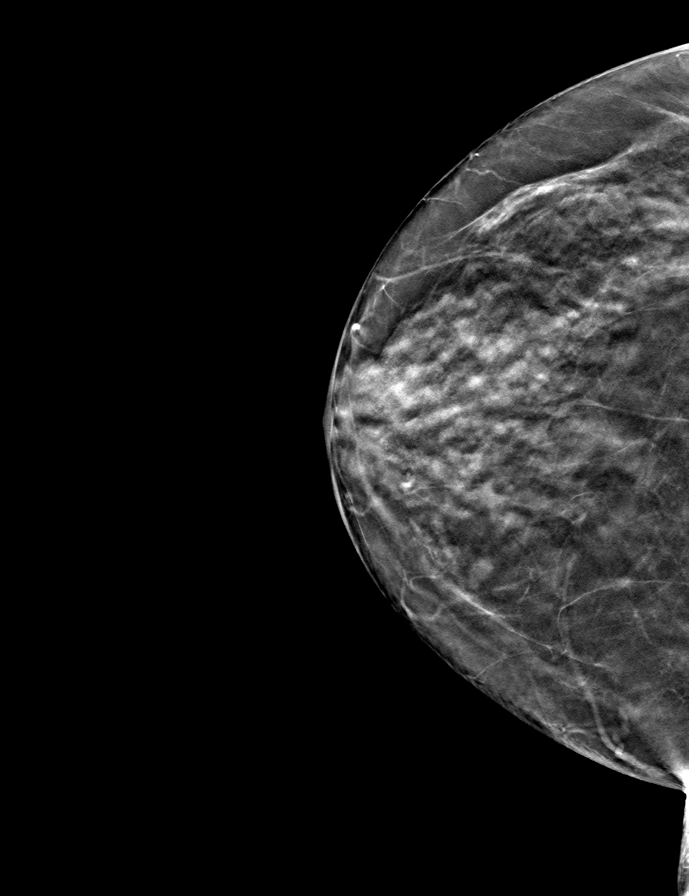
[frame 29/58]
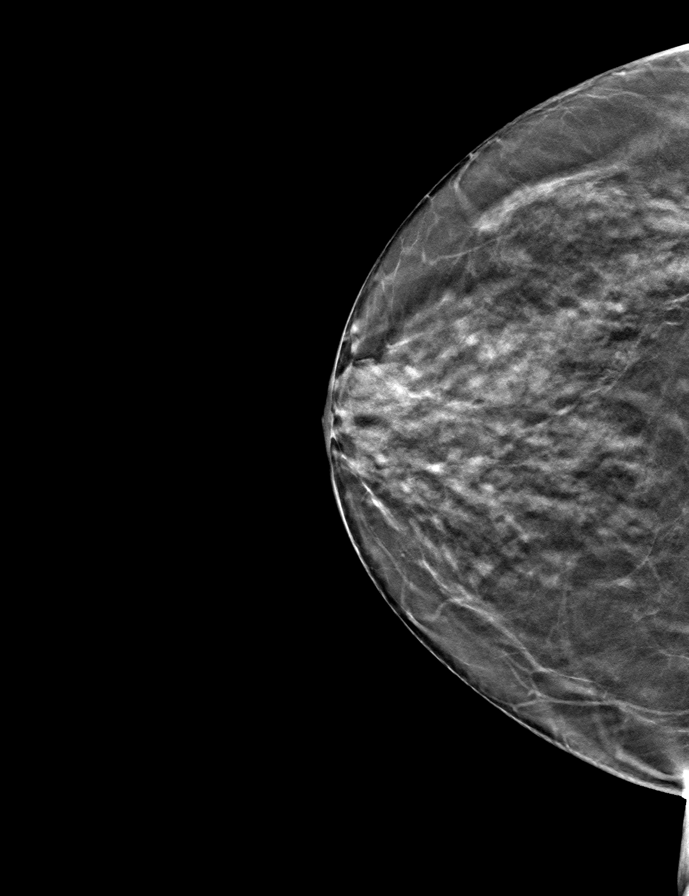

[L CC tomo · tomo slice 30/59.0]
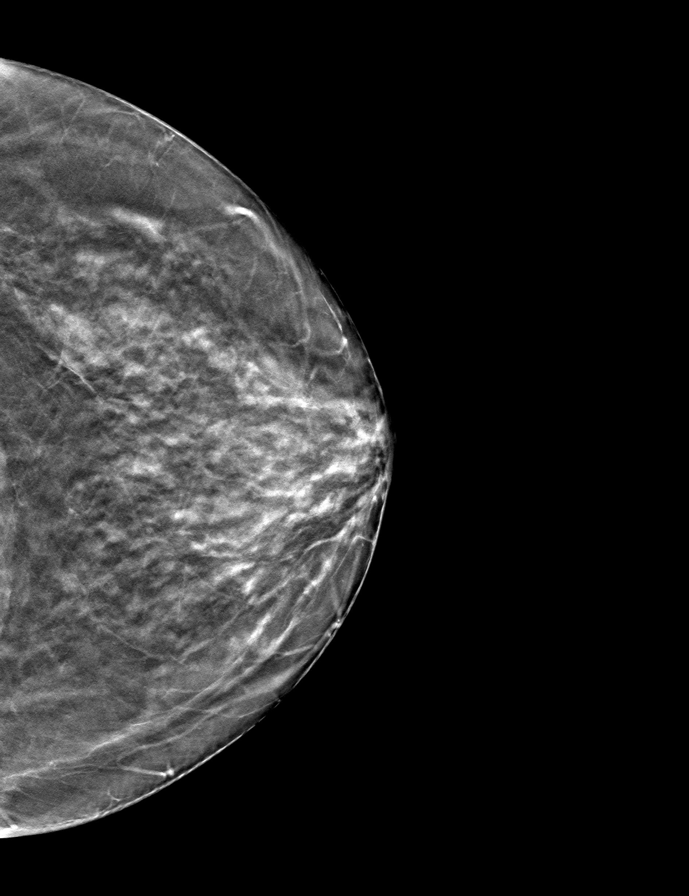

[L MLO tomo · tomo slice 30/59.0]
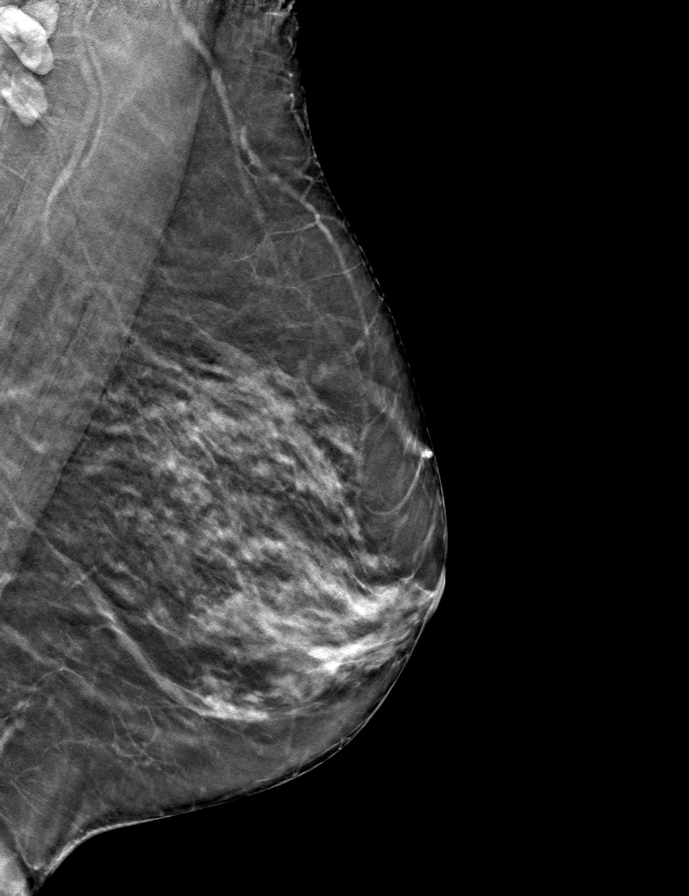

[R MLO tomo · tomo slice 29/56.0]
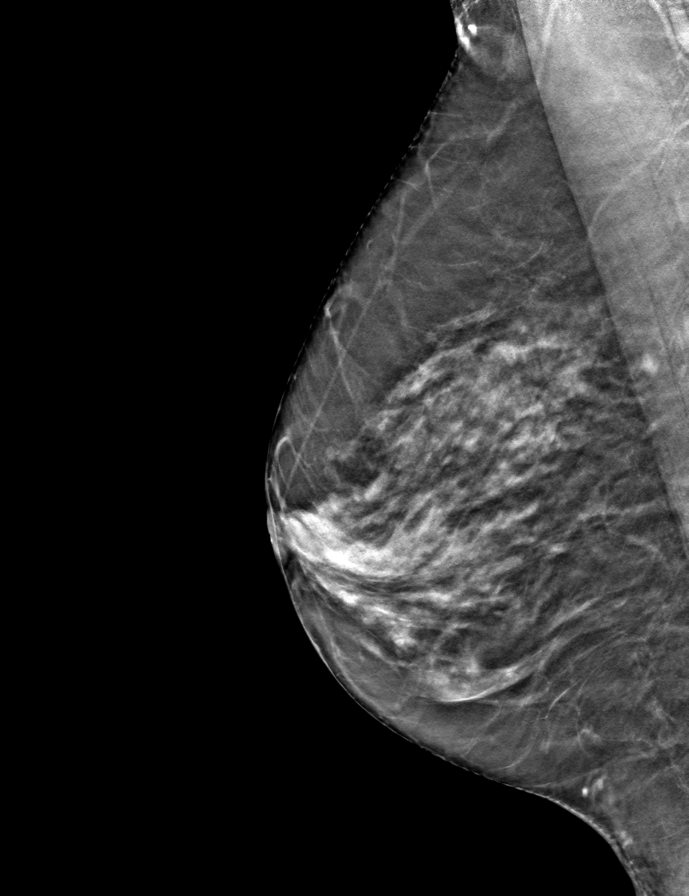

[9 of 24 positions shown; findings below may reference images not displayed]

ACR Breast Density Category c: The breast tissue is heterogeneously
dense, which may obscure small masses.
FINDINGS: There are no findings suspicious for malignancy. Images were
processed with CAD.
IMPRESSION: No mammographic evidence of malignancy. A result letter of this
screening mammogram will be mailed directly to the patient.

RECOMMENDATION:
Screening mammogram in one year. (Code:[id number]-U-LHB)

BI-RADS CATEGORY  1: Negative.

## 2019-10-12 ENCOUNTER — Ambulatory Visit: Payer: Medicare Other

## 2019-10-19 ENCOUNTER — Ambulatory Visit: Payer: Medicare Other

## 2019-10-29 ENCOUNTER — Ambulatory Visit: Payer: Medicare Other

## 2020-04-14 ENCOUNTER — Other Ambulatory Visit: Payer: Self-pay | Admitting: Family Medicine

## 2020-04-14 DIAGNOSIS — Z1231 Encounter for screening mammogram for malignant neoplasm of breast: Secondary | ICD-10-CM

## 2020-05-16 ENCOUNTER — Other Ambulatory Visit: Payer: Self-pay

## 2020-05-16 ENCOUNTER — Ambulatory Visit
Admission: RE | Admit: 2020-05-16 | Discharge: 2020-05-16 | Disposition: A | Discharge status: Home / Self Care | Payer: Medicare Other | Source: Ambulatory Visit | Attending: Family Medicine | Admitting: Family Medicine

## 2020-05-16 DIAGNOSIS — Z1231 Encounter for screening mammogram for malignant neoplasm of breast: Secondary | ICD-10-CM

## 2020-08-13 NOTE — Progress Notes (Signed)
New Patient Note  RE: Judy Morrison MRN: [id number] DOB: August 27, 1951 Date of Office Visit: 08/14/2020  Referring provider: Jeri Cos, MD Primary care provider: Leone Brand, PA-C  Chief Complaint: Allergic Rhinitis  (months ago scalp itching Aug- dermatologist )  History of Present Illness: I had the pleasure of seeing Judy Morrison for initial evaluation at the [facility] [place] [facility] [place] on 08/14/2020. She is a 69 y.o. female, who is elf-referred here for the evaluation of rash.  Patient noticed itching of her scalp for the past few months which is progressively worsening. Now having issues with bumps and rashes on her posterior hair line. No triggers noted but worse after being overheated.   She switched her shampoo 5 times with no benefit - tried sulfate free, head & shoulders, selsun blue.  Patient does not use conditioner and does not get hair dyed.  She does use hair spray and volumizer which she used the same Morrison for years.  Currently using kirkland Morrison laundry detergent and using dryer sheets.   Denies any fevers, chills, changes in medications, foods, or recent infections. She has tried the following therapies: benadryl with minimal benefit.  No recent tick bites.  Systemic steroids no. Previous work up includes: saw dermatology in August and was recommended to try different shampoos including a sulfate free shampoo which did not help. No prior patch testing.   Previous history of rash/hives: none. Patient is up to date with the following cancer screening tests: physical exam, colonoscopy, mammogram.   Assessment and Plan: Judy Morrison is a 69 y.o. female with: Pruritus Scalp pruritus for the past few months which is progressively worsening and now developing rashes along the posterior hairline from scratching. Changed shampoos with no benefit. Does not dye hair. 1 dog at home whom she sleeps in the same bed with. No recent tick bites or bug  infestations. Dermatology recommended to switch shampoos around. No prior patch testing.   Given distribution, concerning for contact dermatitis from something patient is using on her hair.  See below for proper skin care.  Please use fragrance free and dye free products.  Stop using hair spray and volumizer and see if the itching improves.  No dryer sheets or fabric softener for laundry.  Take zyrtec (cetirizine) 10mg  daily at night to help with the itching.  If no improvement, then recommend patch testing next and possible blood work.  Samples given for Vanicream products.   May try some OTC hydrocortisone cream twice a day as needed on the rash.   Return in about 1 month (around 09/15/2020).  Other allergy screening: Asthma: no Rhino conjunctivitis:  Mild rhinitis symptoms.  Food allergy: no Medication allergy: yes Hymenoptera allergy: no Urticaria: no Eczema:no History of recurrent infections suggestive of immunodeficency: no  Diagnostics: None.  Past Medical History: Patient Active Problem List   Diagnosis Date Noted   Pruritus 08/14/2020   Dizziness 05/10/2018   Atypical chest pain 05/10/2018   Primary osteoarthritis of left knee 01/06/2018   S/P knee replacement 08/12/2017   Incomplete tear of right rotator cuff 05/19/2017   Osteopenia 05/19/2017   Urge incontinence of urine 05/19/2017   Primary open angle glaucoma (POAG) 12/30/2016   Primary osteoarthritis of both knees 12/30/2016   Recurrent oral herpes simplex infection 12/30/2016   Tubular adenoma of colon 12/30/2016   Hypothyroidism 06/25/2016   Right foot pain 12/19/2014   Anxiety 05/21/2011   Seasonal allergies 05/21/2011   Past Medical History:  Diagnosis  Date   Anemia    Arthritis    Depressed    GERD (gastroesophageal reflux disease)    controlled with Omeprazole   Glaucoma    Torn rotator cuff    RIGHT    Urinary tract infection    dx 05-26-17 took 1 week cipro  BID completed 06-02-17.   Past Surgical History: Past Surgical History:  Procedure Laterality Date   ADENOIDECTOMY     APPENDECTOMY     arthroscopic right knee      CHOLECYSTECTOMY     COLONOSCOPY     Pelvic sling     x2   POLYPECTOMY     TONSILLECTOMY     TOTAL KNEE ARTHROPLASTY Right 08/12/2017   Procedure: RIGHT TOTAL KNEE ARTHROPLASTY;  Surgeon: Sydnee Cabal, MD;  Location: [facility];  Service: Orthopedics;  Laterality: Right;   TOTAL KNEE ARTHROPLASTY Left 01/06/2018   Procedure: LEFT TOTAL KNEE ARTHROPLASTY;  Surgeon: Sydnee Cabal, MD;  Location: [facility];  Service: Orthopedics;  Laterality: Left;   UPPER GASTROINTESTINAL ENDOSCOPY     WISDOM TOOTH EXTRACTION     Medication List:  Current Outpatient Medications  Medication Sig Dispense Refill   acetaminophen (TYLENOL) 500 MG tablet Take 1,000 mg every 6 (six) hours as needed by mouth for moderate pain or headache.     Adapalene 0.3 % gel Apply 1 application daily as needed topically (for acne).     ALPRAZolam (XANAX) 0.25 MG tablet Take 0.25 mg by mouth daily as needed for anxiety.   1   Brexpiprazole (REXULTI) 1 MG TABS Take 1 mg by mouth daily after breakfast.      Cholecalciferol (VITAMIN D3) 1000 units CAPS Take 1,000 Units at bedtime by mouth.     clonazePAM (KLONOPIN) 0.5 MG tablet Take 0.25 mg 2 (two) times daily by mouth.      DULoxetine HCl 40 MG CPEP Take 40 mg by mouth daily after breakfast.  1   ibuprofen (ADVIL,MOTRIN) 200 MG tablet Take 400 mg every 6 (six) hours as needed by mouth for headache or moderate pain.      latanoprost (XALATAN) 0.005 % ophthalmic solution Place 1 drop into both eyes at bedtime.     levothyroxine (SYNTHROID, LEVOTHROID) 50 MCG tablet Take 50 mcg at bedtime by mouth.      loratadine (CLARITIN) 10 MG tablet Take 10 mg by mouth daily.     nitrofurantoin (MACRODANTIN) 50 MG capsule Take 50 mg by mouth 4 (four) times daily.     nortriptyline (PAMELOR) 50 MG capsule  Take 100 mg at bedtime by mouth.      omeprazole (PRILOSEC) 40 MG capsule Take 40 mg by mouth daily before breakfast.     rosuvastatin (CRESTOR) 5 MG tablet Take 5 mg by mouth daily.     valACYclovir (VALTREX) 1000 MG tablet Take 1,000 mg by mouth 2 (two) times daily.     VITAMIN D, ERGOCALCIFEROL, PO Take 4,000 Units by mouth daily.     No current facility-administered medications for this visit.   Allergies: Allergies  Allergen Reactions   Codeine Nausea Only   Erythromycin Itching   Penicillins Itching, Swelling and Other (See Comments)    Has patient had a PCN reaction causing immediate rash, facial/tongue/throat swelling, SOB or lightheadedness with hypotension: Yes Has patient had a PCN reaction causing severe rash involving mucus membranes or skin necrosis: No Has patient had a PCN reaction that required hospitalization: No Has patient had a PCN reaction occurring within the  last 10 years: No If all of the above answers are "NO", then may proceed with Cephalosporin use.    Social History: Social History   Socioeconomic History   Marital status: Single    Spouse name: Not on file   Number of children: Not on file   Years of education: Not on file   Highest education level: Not on file  Occupational History   Not on file  Tobacco Use   Smoking status: Former Smoker    Types: Cigarettes    Quit date: 1986    Years since quitting: 35.9   Smokeless tobacco: Never Used  Scientific laboratory technician Use: Never used  Substance and Sexual Activity   Alcohol use: Yes    Comment: 3-4 beers monthly; 2 WINE GLASS PER MONTH    Drug use: No   Sexual activity: Not on file  Other Topics Concern   Not on file  Social History Narrative   Not on file   Social Determinants of Health   Financial Resource Strain:    Difficulty of Paying Living Expenses: Not on file  Food Insecurity:    Worried About Charity fundraiser in the Last Year: Not on file   [employer] of  Food in the Last Year: Not on file  Transportation Needs:    Lack of Transportation (Medical): Not on file   Lack of Transportation (Non-Medical): Not on file  Physical Activity:    Days of Exercise per Week: Not on file   Minutes of Exercise per Session: Not on file  Stress:    Feeling of Stress : Not on file  Social Connections:    Frequency of Communication with Friends and Family: Not on file   Frequency of Social Gatherings with Friends and Family: Not on file   Attends Religious Services: Not on file   Active Member of Clubs or Organizations: Not on file   Attends Archivist Meetings: Not on file   Marital Status: Not on file   Lives in a 71 year old condo. Smoking: denies Occupation: retired, work part time at a plant and Recruitment consultant History: Environmental education officer in the house: no Charity fundraiser in the family room: no Carpet in the bedroom: yes Heating: gas Cooling: central Pet: yes 1 dog x 2 yrs  Family History: Family History  Problem Relation Age of Onset   Colon cancer Neg Hx    Breast cancer Neg Hx    Esophageal cancer Neg Hx    Pancreatic cancer Neg Hx    Prostate cancer Neg Hx    Rectal cancer Neg Hx    Stomach cancer Neg Hx    Allergic rhinitis Neg Hx    Asthma Neg Hx    Eczema Neg Hx    Urticaria Neg Hx    Review of Systems  Constitutional: Negative for appetite change, chills, fever and unexpected weight change.  HENT: Negative for congestion and rhinorrhea.   Eyes: Negative for itching.  Respiratory: Negative for cough, chest tightness, shortness of breath and wheezing.   Cardiovascular: Negative for chest pain.  Gastrointestinal: Negative for abdominal pain.  Genitourinary: Negative for difficulty urinating.  Skin: Positive for rash.  Neurological: Negative for headaches.   Objective: BP (!) 114/58 (BP Location: Right Arm, Patient Position: Sitting, Cuff Size: Normal)    Pulse 83    Temp 97.7 F (36.5  C) (Temporal)    Resp 16    Ht 5\' 5"  (1.651 m)  Wt 156 lb (70.8 kg)    SpO2 98%    BMI 25.96 kg/m  Body mass index is 25.96 kg/m. Physical Exam Vitals and nursing note reviewed.  Constitutional:      Appearance: Normal appearance. She is well-developed.  HENT:     Head: Normocephalic and atraumatic.     Right Ear: External ear normal.     Left Ear: External ear normal.     Nose: Nose normal.     Mouth/Throat:     Mouth: Mucous membranes are moist.     Pharynx: Oropharynx is clear.  Eyes:     Conjunctiva/sclera: Conjunctivae normal.  Cardiovascular:     Rate and Rhythm: Normal rate and regular rhythm.     Heart sounds: Normal heart sounds. No murmur heard.  No friction rub. No gallop.   Pulmonary:     Effort: Pulmonary effort is normal.     Breath sounds: Normal breath sounds. No wheezing, rhonchi or rales.  Musculoskeletal:     Cervical back: Neck supple.  Skin:    General: Skin is warm.     Findings: Rash present.     Comments: Nickel sized erythematous rash on posterior hair line.   Neurological:     Mental Status: She is alert and oriented to person, place, and time.  Psychiatric:        Behavior: Behavior normal.    The plan was reviewed with the patient/family, and all questions/concerned were addressed.  It was my pleasure to see Michole today and participate in her care. Please feel free to contact me with any questions or concerns.  Sincerely,  Rexene Alberts, DO Allergy & Immunology  [facility] office: [place] office: [phone]

## 2020-08-14 ENCOUNTER — Encounter: Payer: Self-pay | Admitting: Allergy

## 2020-08-14 ENCOUNTER — Other Ambulatory Visit: Payer: Self-pay

## 2020-08-14 ENCOUNTER — Ambulatory Visit (INDEPENDENT_AMBULATORY_CARE_PROVIDER_SITE_OTHER): Payer: Medicare Other | Admitting: Allergy

## 2020-08-14 VITALS — BP 114/58 | HR 83 | Temp 97.7°F | Resp 16 | Ht 65.0 in | Wt 156.0 lb

## 2020-08-14 DIAGNOSIS — L299 Pruritus, unspecified: Secondary | ICD-10-CM

## 2020-08-14 HISTORY — DX: Pruritus, unspecified: L29.9

## 2020-08-14 NOTE — Patient Instructions (Addendum)
Given your distribution, concerning if you are using something on your hair causing this issues.  See below for proper skin care.  Please use fragrance free and dye free products.  Stop using hair spray and volumizer and see if the itching improves.   No dryer sheets or fabric softener for laundry.  Take zyrtec (cetirizine) 10mg  daily at night to help with the itching.  If no improvement, then recommend patch testing next and possible blood work.  Schedule for appointment in 4 weeks with me in our [place] office on a Monday.  Patches are best placed on Monday with return to office on Wednesday and Friday of same week for readings.  Patches once placed should not get wet.  You do not have to stop any medications for patch testing but should not be on oral prednisone. You can schedule a patch testing visit when convenient for your schedule.    True Test looks for the following sensitivities:       Skin care recommendations  Bath time: . Always use lukewarm water. AVOID very hot or cold water. Marland Kitchen Keep bathing time to 5-10 minutes. . Do NOT use bubble bath. . Use a mild soap and use just enough to wash the dirty areas. . Do NOT scrub skin vigorously.  . After bathing, pat dry your skin with a towel. Do NOT rub or scrub the skin.  Moisturizers and prescriptions:  . ALWAYS apply moisturizers immediately after bathing (within 3 minutes). This helps to lock-in moisture. . Use the moisturizer several times a day over the whole body. Kermit Balo summer moisturizers include: Aveeno, CeraVe, Cetaphil. Kermit Balo winter moisturizers include: Aquaphor, Vaseline, Cerave, Cetaphil, Eucerin, Vanicream. . When using moisturizers along with medications, the moisturizer should be applied about one hour after applying the medication to prevent diluting effect of the medication or moisturize around where you applied the medications. When not using medications, the moisturizer can be continued twice daily as  maintenance.  Laundry and clothing: . Avoid laundry products with added color or perfumes. . Use unscented hypo-allergenic laundry products such as Tide free, Cheer free & gentle, and All free and clear.  . If the skin still seems dry or sensitive, you can try double-rinsing the clothes. . Avoid tight or scratchy clothing such as wool. . Do not use fabric softeners or dyer sheets.

## 2020-08-14 NOTE — Assessment & Plan Note (Signed)
Scalp pruritus for the past few months which is progressively worsening and now developing rashes along the posterior hairline from scratching. Changed shampoos with no benefit. Does not dye hair. 1 dog at home whom she sleeps in the same bed with. No recent tick bites or bug infestations. Dermatology recommended to switch shampoos around. No prior patch testing.   Given distribution, concerning for contact dermatitis from something patient is using on her hair.  See below for proper skin care.  Please use fragrance free and dye free products.  Stop using hair spray and volumizer and see if the itching improves.  No dryer sheets or fabric softener for laundry.  Take zyrtec (cetirizine) 10mg  daily at night to help with the itching.  If no improvement, then recommend patch testing next and possible blood work.  Samples given for Vanicream products.   May try some OTC hydrocortisone cream twice a day as needed on the rash.

## 2020-09-14 NOTE — Progress Notes (Unsigned)
Follow Up Note  RE: Judy Morrison MRN: [id number] DOB: 1951/05/24 Date of Office Visit: 09/15/2020  Referring provider: Ivor Costa Primary care provider: Leone Brand, PA-C  Chief Complaint: Pruritus and Rash  History of Present Illness: I had the pleasure of seeing [facility] for a follow up visit at the [facility] [place] [facility] [place] on 09/15/2020. She is a 70 y.o. female, who is being followed for pruritus. Her previous allergy office visit was on 08/14/2020 with Dr. Maudie Mercury. Today is a regular follow up visit.  Pruritus Tried free and clear hair products, laundry detergent and new brush with no benefit. Still itching in the back of her neck and rash on the back of her shoulder. Currently using OTC itch relief with some benefit.   Taking hydroxyzine BID which helps a little with the itching but also on it for anxiety per psych.  Assessment and Plan: [name] is a 70 y.o. female with: Allergic contact dermatitis Past history - Scalp pruritus for the past few months which is progressively worsening and now developing rashes along the posterior hairline and shoulders. Changed shampoos with no benefit. Does not dye hair. 1 dog at home whom she sleeps in the same bed with. No recent tick bites or bug infestations. Dermatology recommended to switch shampoos around. No prior patch testing.  Interim history - No improvement with using fragrance free and dye free products. Hydroxyzine helps a little (also taking it for anxiety).  Given distribution, concerning for contact dermatitis.  Patches placed today.  Will make additional recommendations based on results. Will send in topical steroid cream that's on safe list after final read.   Okay to continue with hydroxyzine twice a day for the itching.  Continue proper skin care as below. Get bloodwork to rule out other etiologies.  If above work up is unremarkable then recommend skin biopsy next.   Return in  about 2 days (around 09/17/2020) for Patch reading.  Lab Orders     CBC with Differential/Platelet     Comprehensive metabolic panel     C-reactive protein     Sedimentation rate     Tryptase     Thyroid Cascade Profile     ANA w/Reflex     Alpha-Gal Panel     Allergens w/Total IgE Area 2  Diagnostics: None.  T.R.U.E. Test - 09/15/20 1211    Time Antigen Placed [facility]    Lot # [id number]    Location Back    Number of Test 36    Reading Interval Day 1    Panel Panel 1;Panel 2;Panel 3          Medication List:  Current Outpatient Medications  Medication Sig Dispense Refill  . acetaminophen (TYLENOL) 500 MG tablet Take 1,000 mg every 6 (six) hours as needed by mouth for moderate pain or headache.    . Adapalene 0.3 % gel Apply 1 application daily as needed topically (for acne).    . ALPRAZolam (XANAX) 0.25 MG tablet Take 0.25 mg by mouth daily as needed for anxiety.   1  . Brexpiprazole (REXULTI) 1 MG TABS Take 1 mg by mouth daily after breakfast.     . Cholecalciferol (VITAMIN D3) 1000 units CAPS Take 1,000 Units at bedtime by mouth.    . clonazePAM (KLONOPIN) 0.5 MG tablet Take 0.25 mg by mouth 2 (two) times daily.    . DULoxetine HCl 40 MG CPEP Take 40 mg  by mouth daily after breakfast.  1  . ibuprofen (ADVIL,MOTRIN) 200 MG tablet Take 400 mg every 6 (six) hours as needed by mouth for headache or moderate pain.     Marland Kitchen latanoprost (XALATAN) 0.005 % ophthalmic solution Place 1 drop into both eyes at bedtime.    Marland Kitchen levothyroxine (SYNTHROID, LEVOTHROID) 50 MCG tablet Take 50 mcg at bedtime by mouth.     . loratadine (CLARITIN) 10 MG tablet Take 10 mg by mouth daily.    . nitrofurantoin (MACRODANTIN) 50 MG capsule Take 50 mg by mouth 4 (four) times daily.    . nortriptyline (PAMELOR) 50 MG capsule Take 100 mg at bedtime by mouth.     Marland Kitchen omeprazole (PRILOSEC) 40 MG capsule Take 40 mg by mouth daily before breakfast.    . rosuvastatin (CRESTOR) 5 MG tablet Take 5 mg by  mouth daily.    . valACYclovir (VALTREX) 1000 MG tablet Take 1,000 mg by mouth 2 (two) times daily.    Marland Kitchen VITAMIN D, ERGOCALCIFEROL, PO Take 4,000 Units by mouth daily.     No current facility-administered medications for this visit.   Allergies: Allergies  Allergen Reactions  . Codeine Nausea Only  . Erythromycin Itching  . Penicillins Itching, Swelling and Other (See Comments)    Has patient had a PCN reaction causing immediate rash, facial/tongue/throat swelling, SOB or lightheadedness with hypotension: Yes Has patient had a PCN reaction causing severe rash involving mucus membranes or skin necrosis: No Has patient had a PCN reaction that required hospitalization: No Has patient had a PCN reaction occurring within the last 10 years: No If all of the above answers are "NO", then may proceed with Cephalosporin use.    I reviewed her past medical history, social history, family history, and environmental history and no significant changes have been reported from her previous visit.  Review of Systems  Constitutional: Negative for appetite change, chills, fever and unexpected weight change.  HENT: Negative for congestion and rhinorrhea.   Eyes: Negative for itching.  Respiratory: Negative for cough, chest tightness, shortness of breath and wheezing.   Cardiovascular: Negative for chest pain.  Gastrointestinal: Negative for abdominal pain.  Genitourinary: Negative for difficulty urinating.  Skin: Positive for rash.  Neurological: Negative for headaches.   Objective: BP 106/70 (BP Location: Right Arm, Patient Position: Sitting, Cuff Size: Normal)   Pulse 93   Temp 97.8 F (36.6 C)   Resp 18   Ht 5\' 4"  (1.626 m)   Wt 157 lb (71.2 kg)   SpO2 98%   BMI 26.95 kg/m  Body mass index is 26.95 kg/m. Physical Exam Vitals and nursing note reviewed.  Constitutional:      Appearance: Normal appearance. She is well-developed.  HENT:     Head: Normocephalic and atraumatic.     Right  Ear: Tympanic membrane and external ear normal.     Left Ear: Tympanic membrane and external ear normal.     Nose: Nose normal.     Mouth/Throat:     Mouth: Mucous membranes are moist.     Pharynx: Oropharynx is clear.  Eyes:     Conjunctiva/sclera: Conjunctivae normal.  Cardiovascular:     Rate and Rhythm: Normal rate and regular rhythm.     Heart sounds: Normal heart sounds. No murmur heard. No friction rub. No gallop.   Pulmonary:     Effort: Pulmonary effort is normal.     Breath sounds: Normal breath sounds. No wheezing, rhonchi or rales.  Musculoskeletal:  Cervical back: Neck supple.  Skin:    General: Skin is warm.     Findings: Rash present.     Comments: Erythematous rash on posterior hair line and left upper shoulder blade area.  Neurological:     Mental Status: She is alert and oriented to person, place, and time.  Psychiatric:        Behavior: Behavior normal.    Previous notes and tests were reviewed. The plan was reviewed with the patient/family, and all questions/concerned were addressed.  It was my pleasure to see Latrish today and participate in her care. Please feel free to contact me with any questions or concerns.  Sincerely,  Rexene Alberts, DO Allergy & Immunology  [facility] office: [place] office: [phone]

## 2020-09-15 ENCOUNTER — Encounter: Payer: Self-pay | Admitting: Allergy

## 2020-09-15 ENCOUNTER — Other Ambulatory Visit: Payer: Self-pay

## 2020-09-15 ENCOUNTER — Ambulatory Visit (INDEPENDENT_AMBULATORY_CARE_PROVIDER_SITE_OTHER): Payer: Medicare Other | Admitting: Allergy

## 2020-09-15 VITALS — BP 106/70 | HR 93 | Temp 97.8°F | Resp 18 | Ht 64.0 in | Wt 157.0 lb

## 2020-09-15 DIAGNOSIS — L239 Allergic contact dermatitis, unspecified cause: Secondary | ICD-10-CM | POA: Insufficient documentation

## 2020-09-15 DIAGNOSIS — R21 Rash and other nonspecific skin eruption: Secondary | ICD-10-CM | POA: Diagnosis not present

## 2020-09-15 DIAGNOSIS — L299 Pruritus, unspecified: Secondary | ICD-10-CM

## 2020-09-15 NOTE — Patient Instructions (Addendum)
Rash:  Patches placed today. Please avoid strenuous physical activities and do not get the patches on the back wet. No showering until patches removed.  Okay to take antihistamines for itching but avoid placing any creams on the back where the patches are. We will remove the patches on Wednesday and will do our initial read. Then you will come back on Friday for a final read. Will make additional recommendations based on results.   Okay to continue with hydroxyzine twice a day for the itching.  Continue proper skin care as below. Get bloodwork:  We are ordering labs, so please allow 1-2 weeks for the results to come back. With the newly implemented Cures Act, the labs might be visible to you at the same time that they become visible to me. However, I will not address the results until all of the results are back, so please be patient.   Follow up in 2 days for patch reading.  Skin care recommendations  Bath time: . Always use lukewarm water. AVOID very hot or cold water. Marland Kitchen Keep bathing time to 5-10 minutes. . Do NOT use bubble bath. . Use a mild soap and use just enough to wash the dirty areas. . Do NOT scrub skin vigorously.  . After bathing, pat dry your skin with a towel. Do NOT rub or scrub the skin.  Moisturizers and prescriptions:  . ALWAYS apply moisturizers immediately after bathing (within 3 minutes). This helps to lock-in moisture. . Use the moisturizer several times a day over the whole body. Peri Jefferson summer moisturizers include: Aveeno, CeraVe, Cetaphil. Peri [place] winter moisturizers include: Aquaphor, Vaseline, Cerave, Cetaphil, Eucerin, Vanicream. . When using moisturizers along with medications, the moisturizer should be applied about one hour after applying the medication to prevent diluting effect of the medication or moisturize around where you applied the medications. When not using medications, the moisturizer can be continued twice daily as maintenance.  Laundry  and clothing: . Avoid laundry products with added color or perfumes. . Use unscented hypo-allergenic laundry products such as Tide free, Cheer free & gentle, and All free and clear.  . If the skin still seems dry or sensitive, you can try double-rinsing the clothes. . Avoid tight or scratchy clothing such as wool. . Do not use fabric softeners or dyer sheets.

## 2020-09-15 NOTE — Assessment & Plan Note (Addendum)
Past history - Scalp pruritus for the past few months which is progressively worsening and now developing rashes along the posterior hairline and shoulders. Changed shampoos with no benefit. Does not dye hair. 1 dog at home whom she sleeps in the same bed with. No recent tick bites or bug infestations. Dermatology recommended to switch shampoos around. No prior patch testing.  Interim history - No improvement with using fragrance free and dye free products. Hydroxyzine helps a little (also taking it for anxiety).  Given distribution, concerning for contact dermatitis.  Patches placed today.  Will make additional recommendations based on results. Will send in topical steroid cream that's on safe list after final read.   Okay to continue with hydroxyzine twice a day for the itching.  Continue proper skin care as below. Get bloodwork to rule out other etiologies.  If above work up is unremarkable then recommend skin biopsy next.

## 2020-09-16 LAB — ENA+DNA/DS+SJORGEN'S: ENA SSA (RO) Ab: <0.2 AI (ref 0.0–0.9)

## 2020-09-16 LAB — CBC WITH DIFFERENTIAL/PLATELET
Eos: 2 %
Hemoglobin: 13.7 g/dL (ref 11.1–15.9)
Monocytes: 9 %

## 2020-09-16 LAB — ALPHA-GAL PANEL

## 2020-09-16 LAB — COMPREHENSIVE METABOLIC PANEL
Alkaline Phosphatase: 109 IU/L (ref 44–121)
GFR calc Af Amer: 74 mL/min/{1.73_m2} (ref >59)

## 2020-09-17 ENCOUNTER — Ambulatory Visit: Payer: Medicare Other | Admitting: Allergy

## 2020-09-17 ENCOUNTER — Encounter: Payer: Self-pay | Admitting: Allergy

## 2020-09-17 ENCOUNTER — Other Ambulatory Visit: Payer: Self-pay

## 2020-09-17 DIAGNOSIS — L239 Allergic contact dermatitis, unspecified cause: Secondary | ICD-10-CM

## 2020-09-17 LAB — ENA+DNA/DS+SJORGEN'S
ENA RNP Ab: 0.4 AI (ref 0.0–0.9)
ENA SM Ab Ser-aCnc: <0.2 AI (ref 0.0–0.9)
dsDNA Ab: <1 IU/mL (ref 0–9)

## 2020-09-17 LAB — CBC WITH DIFFERENTIAL/PLATELET
EOS (ABSOLUTE): 0.2 10*3/uL (ref 0.0–0.4)
Hematocrit: 41.3 % (ref 34.0–46.6)
MCHC: 33.2 g/dL (ref 31.5–35.7)
Neutrophils Absolute: 6.4 10*3/uL (ref 1.4–7.0)
WBC: 8.8 10*3/uL (ref 3.4–10.8)

## 2020-09-17 LAB — COMPREHENSIVE METABOLIC PANEL
Albumin/Globulin Ratio: 2.1 (ref 1.2–2.2)
CO2: 23 mmol/L (ref 20–29)
Chloride: 96 mmol/L (ref 96–106)

## 2020-09-17 LAB — ALPHA-GAL PANEL

## 2020-09-17 LAB — C-REACTIVE PROTEIN: CRP: 2 mg/L (ref 0–10)

## 2020-09-17 NOTE — Assessment & Plan Note (Signed)
Past history - Scalp pruritus for the past few months which is progressively worsening and now developing rashes along the posterior hairline and shoulders. Changed shampoos with no benefit. Does not dye hair. 1 dog at home whom she sleeps in the same bed with. No recent tick bites or bug infestations. Dermatology recommended to switch shampoos around. No prior patch testing.  Interim history   TRUE patches removed today.  48 hour read borderline positive to Nickel and Diazolidinyl Urea.

## 2020-09-17 NOTE — Progress Notes (Signed)
   Follow Up Note  RE: Judy Morrison MRN: [id number] DOB: 08-24-1951 Date of Office Visit: 09/17/2020  Referring provider: Concepcion Elk Primary care provider: Anne Ng, PA-C  History of Present Illness: I had the pleasure of seeing Judy Morrison for a follow up visit at the [facility] on 09/17/2020. She is a 70 y.o. female, who is being followed for contact dermatitis. Today she is here for initial patch test interpretation, given suspected history of contact dermatitis.   Diagnostics:  TRUE TEST 48 hour reading:   T.R.U.E. Test - 09/17/20 1100    Reading Interval Day 3    1. Nickel Sulfate --   +/-   25. Diazolidinyl Urea --   +/-           Assessment and Plan: Judy Morrison is a 70 y.o. female with: Allergic contact dermatitis Past history - Scalp pruritus for the past few months which is progressively worsening and now developing rashes along the posterior hairline and shoulders. Changed shampoos with no benefit. Does not dye hair. 1 dog at home whom she sleeps in the same bed with. No recent tick bites or bug infestations. Dermatology recommended to switch shampoos around. No prior patch testing.  Interim history   TRUE patches removed today.  48 hour read borderline positive to Nickel and Diazolidinyl Urea.   The patient has been provided detailed information regarding the substances she is sensitive to, as well as products containing the substances.  Meticulous avoidance of these substances is recommended. If avoidance is not possible, the use of barrier creams or lotions is recommended.  If symptoms persist or progress despite meticulous avoidance of chemicals/substances above, dermatology evaluation may be warranted. Return in about 2 days (around 09/19/2020) for Patch reading.  It was my pleasure to see Judy Morrison today and participate in her care. Please feel free to contact me with any questions or concerns.  Sincerely,  Wyline Mood, DO Allergy & Immunology  [facility] office: [phone] [facility] office: [phone] [place] office: [phone]

## 2020-09-18 LAB — ALLERGENS W/TOTAL IGE AREA 2
Alternaria Alternata IgE: <0.1 kU/L
Aspergillus Fumigatus IgE: <0.1 kU/L
Bermuda Grass IgE: <0.1 kU/L
Cat Dander IgE: <0.1 kU/L
Cedar, Mountain IgE: <0.1 kU/L
Cladosporium Herbarum IgE: <0.1 kU/L
Cockroach, German IgE: <0.1 kU/L
Common Silver Birch IgE: <0.1 kU/L
Cottonwood IgE: <0.1 kU/L
D Farinae IgE: <0.1 kU/L
D Pteronyssinus IgE: <0.1 kU/L
Dog Dander IgE: 0.12 kU/L — AB
Elm, American IgE: <0.1 kU/L
IgE (Immunoglobulin E), Serum: 258 IU/mL (ref 6–495)
Johnson Grass IgE: <0.1 kU/L
Maple/Box Elder IgE: <0.1 kU/L
Mouse Urine IgE: <0.1 kU/L
Oak, White IgE: <0.1 kU/L
Pecan, Hickory IgE: <0.1 kU/L
Penicillium Chrysogen IgE: <0.1 kU/L
Pigweed, Rough IgE: <0.1 kU/L
Ragweed, Short IgE: <0.1 kU/L
Sheep Sorrel IgE Qn: <0.1 kU/L
Timothy Grass IgE: <0.1 kU/L
White Mulberry IgE: <0.1 kU/L

## 2020-09-18 LAB — CBC WITH DIFFERENTIAL/PLATELET
Basophils Absolute: 0.1 10*3/uL (ref 0.0–0.2)
Basos: 1 %
Immature Grans (Abs): 0 10*3/uL (ref 0.0–0.1)
Immature Granulocytes: 0 %
Lymphocytes Absolute: 1.2 10*3/uL (ref 0.7–3.1)
Lymphs: 14 %
MCH: 28.5 pg (ref 26.6–33.0)
MCV: 86 fL (ref 79–97)
Monocytes Absolute: 0.8 10*3/uL (ref 0.1–0.9)
Neutrophils: 74 %
Platelets: 349 10*3/uL (ref 150–450)
RBC: 4.81 x10E6/uL (ref 3.77–5.28)
RDW: 12.3 % (ref 11.7–15.4)

## 2020-09-18 LAB — ALPHA-GAL PANEL
Beef IgE: 0.13 kU/L — AB
IgE (Immunoglobulin E), Serum: 268 IU/mL (ref 6–495)
O215-IgE Alpha-Gal: <0.1 kU/L

## 2020-09-18 LAB — COMPREHENSIVE METABOLIC PANEL
ALT: 15 IU/L (ref 0–32)
AST: 20 IU/L (ref 0–40)
Albumin: 4.6 g/dL (ref 3.7–4.7)
BUN/Creatinine Ratio: 16 (ref 12–28)
BUN: 14 mg/dL (ref 8–27)
Bilirubin Total: 0.3 mg/dL (ref 0.0–1.2)
Calcium: 9.6 mg/dL (ref 8.7–10.3)
Creatinine, Ser: 0.9 mg/dL (ref 0.57–1.00)
GFR calc non Af Amer: 65 mL/min/{1.73_m2} (ref >59)
Globulin, Total: 2.2 g/dL (ref 1.5–4.5)
Glucose: 81 mg/dL (ref 65–99)
Potassium: 4.4 mmol/L (ref 3.5–5.2)
Sodium: 135 mmol/L (ref 134–144)
Total Protein: 6.8 g/dL (ref 6.0–8.5)

## 2020-09-18 LAB — THYROID CASCADE PROFILE: TSH: 1.59 u[IU]/mL (ref 0.450–4.500)

## 2020-09-18 LAB — TRYPTASE: Tryptase: 6.3 ug/L (ref 2.2–13.2)

## 2020-09-18 LAB — ENA+DNA/DS+SJORGEN'S: ENA SSB (LA) Ab: <0.2 AI (ref 0.0–0.9)

## 2020-09-18 LAB — SEDIMENTATION RATE: Sed Rate: 13 mm/hr (ref 0–40)

## 2020-09-18 LAB — ANA W/REFLEX: Anti Nuclear Antibody (ANA): POSITIVE — AB

## 2020-09-19 ENCOUNTER — Ambulatory Visit (INDEPENDENT_AMBULATORY_CARE_PROVIDER_SITE_OTHER): Payer: Medicare Other | Admitting: Allergy

## 2020-09-19 ENCOUNTER — Encounter: Payer: Self-pay | Admitting: Allergy

## 2020-09-19 ENCOUNTER — Other Ambulatory Visit: Payer: Self-pay

## 2020-09-19 VITALS — Temp 98.6°F

## 2020-09-19 DIAGNOSIS — L230 Allergic contact dermatitis due to metals: Secondary | ICD-10-CM | POA: Diagnosis not present

## 2020-09-19 NOTE — Progress Notes (Signed)
    Follow-up Note  RE: Judy Morrison MRN: [id number] DOB: 1951/06/17 Date of Office Visit: 09/19/2020  Primary care provider: Ivor Costa Referring provider: Rober Minion returns to the office today for the final patch test interpretation, given suspected history of contact dermatitis.    Diagnostics:  TRUE TEST 96 hour reading:  #1 nickel sulfate 2+.  Rest of the test is negative  TRUE TEST 48 hour reading: #1 nickel sulfate +/-; #25diazolidinyl urea +/-  Plan:  Allergic contact dermatitis  The patient has been provided detailed information regarding the substances she is sensitive to, as well as products containing the substances.  Meticulous avoidance of these substances is recommended. If avoidance is not possible, the use of barrier creams or lotions is recommended.  Contact dermatitis products safe list emailed to [email or web address]  Prudy Feeler, MD [facility]

## 2020-10-30 ENCOUNTER — Ambulatory Visit (AMBULATORY_SURGERY_CENTER): Payer: Self-pay | Admitting: *Deleted

## 2020-10-30 ENCOUNTER — Other Ambulatory Visit: Payer: Self-pay

## 2020-10-30 VITALS — Ht 64.0 in | Wt 158.0 lb

## 2020-10-30 DIAGNOSIS — Z86010 Personal history of colonic polyps: Secondary | ICD-10-CM

## 2020-10-30 MED ORDER — PLENVU 140 G PO SOLR: 1.0000 | Freq: Once | ORAL | 0 refills | Status: AC

## 2020-10-30 NOTE — Progress Notes (Signed)
Patient is here in-person for PV. Patient denies any allergies to eggs or soy. Patient denies any problems with anesthesia/sedation. Patient denies any oxygen use at home. Patient denies taking any diet/weight loss medications or blood thinners. Patient is not being treated for MRSA or C-diff. Patient is aware of our care-partner policy and [id number]-[id number] safety protocol.Marland Kitchen   COVID-19 vaccines completed x3, per patient.   Prep Prescription coupon given to the patient.

## 2020-11-06 ENCOUNTER — Ambulatory Visit (AMBULATORY_SURGERY_CENTER): Payer: Medicare Other | Admitting: Internal Medicine

## 2020-11-06 ENCOUNTER — Encounter: Payer: Self-pay | Admitting: Internal Medicine

## 2020-11-06 ENCOUNTER — Other Ambulatory Visit: Payer: Self-pay

## 2020-11-06 VITALS — BP 113/73 | HR 81 | Temp 98.0°F | Resp 16 | Ht 64.0 in | Wt 158.0 lb

## 2020-11-06 DIAGNOSIS — Z86010 Personal history of colonic polyps: Secondary | ICD-10-CM

## 2020-11-06 DIAGNOSIS — D124 Benign neoplasm of descending colon: Secondary | ICD-10-CM

## 2020-11-06 DIAGNOSIS — D123 Benign neoplasm of transverse colon: Secondary | ICD-10-CM

## 2020-11-06 MED ORDER — SODIUM CHLORIDE 0.9 % IV SOLN: 500.0000 mL | Freq: Once | INTRAVENOUS | Status: DC

## 2020-11-06 NOTE — Progress Notes (Signed)
Report to PACU, RN, vss, BBS= Clear.  

## 2020-11-06 NOTE — Progress Notes (Signed)
Called to room to assist during endoscopic procedure.  Patient ID and intended procedure confirmed with present staff. Received instructions for my participation in the procedure from the performing physician.  

## 2020-11-06 NOTE — Progress Notes (Signed)
Pt's states no medical or surgical changes since previsit or office visit.   VS taken by CW 

## 2020-11-06 NOTE — Op Note (Signed)
[facility] Patient Name: Judy Morrison Procedure Date: 11/06/2020 8:37 AM MRN: [id number] Endoscopist: Jerene Bears , MD Age: 70 Referring MD:  Date of Birth: [birth year] Gender: Female Account #: [id number] Procedure:                Colonoscopy Indications:              High risk colon cancer surveillance: Personal                            history of adenomas (including those 10 mm or                            greater in size in 2017) and 1 small adenoma at                            last colonoscopy: September 2018 Medicines:                Monitored Anesthesia Care Procedure:                Pre-Anesthesia Assessment:                           - Prior to the procedure, a History and Physical                            was performed, and patient medications and                            allergies were reviewed. The patient's tolerance of                            previous anesthesia was also reviewed. The risks                            and benefits of the procedure and the sedation                            options and risks were discussed with the patient.                            All questions were answered, and informed consent                            was obtained. Prior Anticoagulants: The patient has                            taken no previous anticoagulant or antiplatelet                            agents. ASA Grade Assessment: II - A patient with                            mild systemic disease. After reviewing the risks  and benefits, the patient was deemed in                            satisfactory condition to undergo the procedure.                           After obtaining informed consent, the colonoscope                            was passed under direct vision. Throughout the                            procedure, the patient's blood pressure, pulse, and                            oxygen saturations were monitored  continuously. The                            Olympus PCF-H190DL (#[id number]) Colonoscope was                            introduced through the anus and advanced to the                            cecum, identified by appendiceal orifice and                            ileocecal valve. The colonoscopy was performed                            without difficulty. The patient tolerated the                            procedure well. The quality of the bowel                            preparation was good. The ileocecal valve,                            appendiceal orifice, and rectum were photographed. Scope In: 8:43:11 AM Scope Out: 9:04:59 AM Scope Withdrawal Time: 0 hours 13 minutes 0 seconds  Total Procedure Duration: 0 hours 21 minutes 48 seconds  Findings:                 The digital rectal exam was normal.                           A 3 mm polyp was found in the transverse colon. The                            polyp was sessile. The polyp was removed with a                            cold snare. Resection and retrieval were complete.  A 5 mm polyp was found in the descending colon. The                            polyp was sessile. The polyp was removed with a                            cold snare. Resection and retrieval were complete.                           Multiple small-mouthed diverticula were found in                            the sigmoid colon.                           Internal hemorrhoids were found during                            retroflexion. The hemorrhoids were small. Complications:            No immediate complications. Estimated Blood Loss:     Estimated blood loss was minimal. Impression:               - One 3 mm polyp in the transverse colon, removed                            with a cold snare. Resected and retrieved.                           - One 5 mm polyp in the descending colon, removed                            with a cold snare.  Resected and retrieved.                           - Diverticulosis in the sigmoid colon.                           - Small internal hemorrhoids. Recommendation:           - Patient has a contact number available for                            emergencies. The signs and symptoms of potential                            delayed complications were discussed with the                            patient. Return to normal activities tomorrow.                            Written discharge instructions were provided to the  patient.                           - Resume previous diet.                           - Continue present medications.                           - Await pathology results.                           - Repeat colonoscopy is recommended for                            surveillance. The colonoscopy date will be                            determined after pathology results from today's                            exam become available for review. Jerene Bears, MD 11/06/2020 9:09:55 AM This report has been signed electronically.

## 2020-11-06 NOTE — Patient Instructions (Signed)
Handouts on polyps, hemorrhoids, and diverticulosis given to you today  Await pathology results    YOU HAD AN ENDOSCOPIC PROCEDURE TODAY AT THE [facility]:   Refer to the procedure report that was given to you for any specific questions about what was found during the examination.  If the procedure report does not answer your questions, please call your gastroenterologist to clarify.  If you requested that your care partner not be given the details of your procedure findings, then the procedure report has been included in a sealed envelope for you to review at your convenience later.  YOU SHOULD EXPECT: Some feelings of bloating in the abdomen. Passage of more gas than usual.  Walking can help get rid of the air that was put into your GI tract during the procedure and reduce the bloating. If you had a lower endoscopy (such as a colonoscopy or flexible sigmoidoscopy) you may notice spotting of blood in your stool or on the toilet paper. If you underwent a bowel prep for your procedure, you may not have a normal bowel movement for a few days.  Please Note:  You might notice some irritation and congestion in your nose or some drainage.  This is from the oxygen used during your procedure.  There is no need for concern and it should clear up in a day or so.  SYMPTOMS TO REPORT IMMEDIATELY:   Following lower endoscopy (colonoscopy or flexible sigmoidoscopy):  Excessive amounts of blood in the stool  Significant tenderness or worsening of abdominal pains  Swelling of the abdomen that is new, acute  Fever of 100F or higher  For urgent or emergent issues, a gastroenterologist can be reached at any hour by calling [phone]. Do not use MyChart messaging for urgent concerns.    DIET:  We do recommend a small meal at first, but then you may proceed to your regular diet.  Drink plenty of fluids but you should avoid alcoholic beverages for 24 hours.  ACTIVITY:  You should plan to take  it easy for the rest of today and you should NOT DRIVE or use heavy machinery until tomorrow (because of the sedation medicines used during the test).    FOLLOW UP: Our staff will call the number listed on your records 48-72 hours following your procedure to check on you and address any questions or concerns that you may have regarding the information given to you following your procedure. If we do not reach you, we will leave a message.  We will attempt to reach you two times.  During this call, we will ask if you have developed any symptoms of COVID 19. If you develop any symptoms (ie: fever, flu-like symptoms, shortness of breath, cough etc.) before then, please call [phone].  If you test positive for Covid 19 in the 2 weeks post procedure, please call and report this information to us.    If any biopsies were taken you will be contacted by phone or by letter within the next 1-3 weeks.  Please call us at [phone] if you have not heard about the biopsies in 3 weeks.    SIGNATURES/CONFIDENTIALITY: You and/or your care partner have signed paperwork which will be entered into your electronic medical record.  These signatures attest to the fact that that the information above on your After Visit Summary has been reviewed and is understood.  Full responsibility of the confidentiality of this discharge information lies with you and/or your care-partner. 

## 2020-11-10 ENCOUNTER — Telehealth: Payer: Self-pay | Admitting: *Deleted

## 2020-11-10 NOTE — Telephone Encounter (Signed)
Follow up call made. 

## 2020-11-12 ENCOUNTER — Encounter: Payer: Self-pay | Admitting: Internal Medicine

## 2021-05-29 DIAGNOSIS — M25511 Pain in right shoulder: Secondary | ICD-10-CM | POA: Insufficient documentation

## 2021-07-01 ENCOUNTER — Other Ambulatory Visit: Payer: Self-pay | Admitting: *Deleted

## 2021-07-01 DIAGNOSIS — Z1231 Encounter for screening mammogram for malignant neoplasm of breast: Secondary | ICD-10-CM

## 2021-07-31 ENCOUNTER — Other Ambulatory Visit: Payer: Self-pay

## 2021-07-31 ENCOUNTER — Ambulatory Visit
Admission: RE | Admit: 2021-07-31 | Discharge: 2021-07-31 | Disposition: A | Discharge status: Home / Self Care | Payer: Medicare Other | Source: Ambulatory Visit | Attending: *Deleted | Admitting: *Deleted

## 2021-07-31 ENCOUNTER — Other Ambulatory Visit: Payer: Self-pay | Admitting: Physician Assistant

## 2021-07-31 DIAGNOSIS — Z1231 Encounter for screening mammogram for malignant neoplasm of breast: Secondary | ICD-10-CM

## 2021-08-14 ENCOUNTER — Other Ambulatory Visit (INDEPENDENT_AMBULATORY_CARE_PROVIDER_SITE_OTHER): Payer: Self-pay | Admitting: Otolaryngology

## 2021-09-15 ENCOUNTER — Encounter: Payer: Medicare Other | Admitting: Genetic Counselor

## 2021-10-01 ENCOUNTER — Emergency Department (HOSPITAL_COMMUNITY): Payer: Medicare Other

## 2021-10-01 ENCOUNTER — Encounter (HOSPITAL_COMMUNITY): Payer: Self-pay | Admitting: Emergency Medicine

## 2021-10-01 ENCOUNTER — Emergency Department (HOSPITAL_COMMUNITY)
Admission: EM | Admit: 2021-10-01 | Discharge: 2021-10-01 | Disposition: A | Discharge status: Home / Self Care | Payer: Medicare Other | Attending: Emergency Medicine | Admitting: Emergency Medicine

## 2021-10-01 DIAGNOSIS — R0602 Shortness of breath: Secondary | ICD-10-CM | POA: Diagnosis present

## 2021-10-01 DIAGNOSIS — M25511 Pain in right shoulder: Secondary | ICD-10-CM | POA: Insufficient documentation

## 2021-10-01 LAB — CBC WITH DIFFERENTIAL/PLATELET
Abs Immature Granulocytes: 0.06 10*3/uL (ref 0.00–0.07)
Basophils Absolute: 0.1 10*3/uL (ref 0.0–0.1)
Basophils Relative: 1 %
Eosinophils Absolute: 0.3 10*3/uL (ref 0.0–0.5)
Eosinophils Relative: 3 %
HCT: 35.7 % — ABNORMAL LOW (ref 36.0–46.0)
Hemoglobin: 11.6 g/dL — ABNORMAL LOW (ref 12.0–15.0)
Immature Granulocytes: 1 %
Lymphocytes Relative: 19 %
Lymphs Abs: 1.9 10*3/uL (ref 0.7–4.0)
MCH: 28.6 pg (ref 26.0–34.0)
MCHC: 32.5 g/dL (ref 30.0–36.0)
MCV: 88.1 fL (ref 80.0–100.0)
Monocytes Absolute: 1.4 10*3/uL — ABNORMAL HIGH (ref 0.1–1.0)
Monocytes Relative: 14 %
Neutro Abs: 6.4 10*3/uL (ref 1.7–7.7)
Neutrophils Relative %: 62 %
Platelets: 276 10*3/uL (ref 150–400)
RBC: 4.05 MIL/uL (ref 3.87–5.11)
RDW: 14.1 % (ref 11.5–15.5)
WBC: 10.1 10*3/uL (ref 4.0–10.5)
nRBC: 0 % (ref 0.0–0.2)

## 2021-10-01 LAB — COMPREHENSIVE METABOLIC PANEL
ALT: 102 U/L — ABNORMAL HIGH (ref 0–44)
AST: 149 U/L — ABNORMAL HIGH (ref 15–41)
Albumin: 3.9 g/dL (ref 3.5–5.0)
Alkaline Phosphatase: 145 U/L — ABNORMAL HIGH (ref 38–126)
Anion gap: 6 (ref 5–15)
BUN: 14 mg/dL (ref 8–23)
CO2: 29 mmol/L (ref 22–32)
Calcium: 8.4 mg/dL — ABNORMAL LOW (ref 8.9–10.3)
Chloride: 101 mmol/L (ref 98–111)
Creatinine, Ser: 0.68 mg/dL (ref 0.44–1.00)
GFR, Estimated: >60 mL/min (ref >60)
Glucose, Bld: 102 mg/dL — ABNORMAL HIGH (ref 70–99)
Potassium: 3.4 mmol/L — ABNORMAL LOW (ref 3.5–5.1)
Sodium: 136 mmol/L (ref 135–145)
Total Bilirubin: 0.3 mg/dL (ref 0.3–1.2)
Total Protein: 6.8 g/dL (ref 6.5–8.1)

## 2021-10-01 LAB — D-DIMER, QUANTITATIVE: D-Dimer, Quant: 1.55 ug/mL-FEU — ABNORMAL HIGH (ref 0.00–0.50)

## 2021-10-01 LAB — TROPONIN I (HIGH SENSITIVITY): Troponin I (High Sensitivity): 7 ng/L (ref <18)

## 2021-10-01 LAB — BRAIN NATRIURETIC PEPTIDE: B Natriuretic Peptide: 46.4 pg/mL (ref 0.0–100.0)

## 2021-10-01 MED ORDER — IOHEXOL 350 MG/ML SOLN
80.0000 mL | Freq: Once | INTRAVENOUS | Status: AC | PRN
  Administered 2021-10-01: 80 mL via INTRAVENOUS

## 2021-10-01 NOTE — Discharge Instructions (Signed)
Please follow-up with your primary care doctor.  Discuss your symptoms from today.  Recommend using the incentive spirometer.  If you feel your breathing is worsening, you develop any chest pain, any episodes of passing out, fevers or other new concerning symptom, please return to the emergency room for reassessment.

## 2021-10-01 NOTE — ED Provider Triage Note (Signed)
Emergency Medicine Provider Triage Evaluation Note  Judy Morrison , a 71 y.o. female  was evaluated in triage.  Pt complains of recently having right sided rotator cuff surgery.  Dr. Theda Sers did the surgery on Tuesday.  She reports that when she is upright that she feels fine but when laying down she has a hard time breathing.    Daughter reports that when she checked it this morning when she checked pulse ox it was 85.  She has been coughing since this morning.  and it sounded rattle according to daughter.   She has been feeling like she was having indigestion since surgery.   She has rx for hydromorphone PO, promethazine, methocarbamol, and clindamycin, all of which are new.  Unclear why patient is on clindamycin.   No fevers  Review of Systems  Positive: See above.  Negative:   Physical Exam  BP 125/78    Pulse 94    Temp 98.3 F (36.8 C) (Oral)    Resp 18    SpO2 99%  Gen:   Awake, no distress   Resp:  Normal effort  MSK:   Moves extremities without difficulty RUE in sling Other:    Medical Decision Making  Medically screening exam initiated at 12:47 PM.  Appropriate orders placed.  Judy Morrison was informed that the remainder of the evaluation will be completed by another provider, this initial triage assessment does not replace that evaluation, and the importance of remaining in the ED until their evaluation is complete.  Note: Portions of this report may have been transcribed using voice recognition software. Every effort was made to ensure accuracy; however, inadvertent computerized transcription errors may be present    Judy Glass, PA-C 10/01/21 1258

## 2021-10-01 NOTE — ED Provider Notes (Signed)
[facility] DEPT Provider Note   CSN: [id number] Arrival date & time: 10/01/21  1126     History  Chief Complaint  Patient presents with   Shortness of Breath    Judy Morrison is a 71 y.o. female.  Presented to the emergency room with concern for shortness of breath.  Patient reports that she had surgery for right rotator cuff earlier this week.  Ever since undergoing surgery and anesthesia, she has felt more short of breath than normal.  Seems to be worse with exertion, improved with rest.  Currently does not feel particularly short of breath.  No associated chest pain.  No episodes of passing out.  No leg pain or leg swelling.  Has had some modest pain in her shoulder but this is relatively well controlled with the pain medicine that was prescribed by her orthopedist.  No fevers or chills or cough.  Patient is accompanied by daughter who provides additional history.  Patient denies prior history of DVT/PE or coronary artery disease.  HPI     Home Medications Prior to Admission medications   Medication Sig Start Date End Date Taking? Authorizing Provider  acetaminophen (TYLENOL) 500 MG tablet Take 1,000 mg every 6 (six) hours as needed by mouth for moderate pain or headache.    [provider]  Adapalene 0.3 % gel Apply 1 application daily as needed topically (for acne).    [provider]  ALPRAZolam Duanne Moron) 0.25 MG tablet Take 0.25 mg by mouth daily as needed for anxiety.  04/14/18   [provider]  Brexpiprazole (REXULTI) 1 MG TABS Take 1 mg by mouth daily after breakfast.     [provider]  Cholecalciferol (VITAMIN D3) 1000 units CAPS Take 1,000 Units at bedtime by mouth.    [provider]  clonazePAM (KLONOPIN) 0.5 MG tablet Take 0.25 mg by mouth 2 (two) times daily.    [provider]  DULoxetine HCl 40 MG CPEP Take 40 mg by mouth daily after breakfast. 03/17/18   [provider]  hydrOXYzine (VISTARIL) 25 MG capsule Take 25-50 mg by mouth daily. 10/07/20   [provider]  ibuprofen (ADVIL,MOTRIN) 200 MG tablet Take 400 mg every 6 (six) hours as needed by mouth for headache or moderate pain.     [provider]  latanoprost (XALATAN) 0.005 % ophthalmic solution Place 1 drop into both eyes at bedtime.    [provider]  levothyroxine (SYNTHROID, LEVOTHROID) 50 MCG tablet Take 50 mcg at bedtime by mouth.     [provider]  loratadine (CLARITIN) 10 MG tablet Take 10 mg by mouth daily.    [provider]  nortriptyline (PAMELOR) 50 MG capsule Take 100 mg at bedtime by mouth.  12/19/14   [provider]  omeprazole (PRILOSEC) 40 MG capsule Take 40 mg by mouth daily before breakfast.    [provider]  rosuvastatin (CRESTOR) 5 MG tablet Take 5 mg by mouth daily.    [provider]  valACYclovir (VALTREX) 1000 MG tablet Take 1,000 mg by mouth daily as needed.    [provider]  VITAMIN D, ERGOCALCIFEROL, PO Take 4,000 Units by mouth daily.    [provider]      Allergies    Codeine, Erythromycin, and Penicillins    Review of Systems   Review of Systems  Constitutional:  Positive for fatigue. Negative for chills and fever.  HENT:  Negative for ear pain and  sore throat.   Eyes:  Negative for pain and visual disturbance.  Respiratory:  Positive for shortness of breath. Negative for cough.   Cardiovascular:  Negative for chest pain and palpitations.  Gastrointestinal:  Negative for abdominal pain and vomiting.  Genitourinary:  Negative for dysuria and hematuria.  Musculoskeletal:  Negative for arthralgias and back pain.  Skin:  Negative for color change and rash.  Neurological:  Negative for seizures and syncope.  All other systems reviewed and are negative.  Physical Exam Updated Vital Signs BP 131/74    Pulse 90    Temp 98.3 F (36.8 C) (Oral)    Resp 16    SpO2 91%   Physical Exam Vitals and nursing note reviewed.  Constitutional:      General: She is not in acute distress.    Appearance: She is well-developed.  HENT:     Head: Normocephalic and atraumatic.  Eyes:     Conjunctiva/sclera: Conjunctivae normal.  Cardiovascular:     Rate and Rhythm: Normal rate and regular rhythm.     Heart sounds: No murmur heard. Pulmonary:     Effort: Pulmonary effort is normal. No respiratory distress.     Breath sounds: Normal breath sounds.  Abdominal:     Palpations: Abdomen is soft.     Tenderness: There is no abdominal tenderness.  Musculoskeletal:        General: No swelling.     Cervical back: Neck supple.  Skin:    General: Skin is warm and dry.     Capillary Refill: Capillary refill takes less than 2 seconds.  Neurological:     Mental Status: She is alert.  Psychiatric:        Mood and Affect: Mood normal.    ED Results / Procedures / Treatments   Labs (all labs ordered are listed, but only abnormal results are displayed) Labs Reviewed  COMPREHENSIVE METABOLIC PANEL - Abnormal; Notable for the following components:      Result Value   Potassium 3.4 (*)    Glucose, Bld 102 (*)    Calcium 8.4 (*)    AST 149 (*)    ALT 102 (*)    Alkaline Phosphatase 145 (*)    All other components within normal limits  CBC WITH DIFFERENTIAL/PLATELET - Abnormal; Notable for the following components:   Hemoglobin 11.6 (*)    HCT 35.7 (*)    Monocytes Absolute 1.4 (*)    All other components within normal limits  D-DIMER, QUANTITATIVE - Abnormal; Notable for the following components:   D-Dimer, Quant 1.55 (*)    All other components within normal limits  BRAIN NATRIURETIC PEPTIDE  TROPONIN I (HIGH SENSITIVITY)    EKG EKG Interpretation  Date/Time:  Thursday October 01 2021 18:39:04 EST Ventricular Rate:  92 PR Interval:  179 QRS Duration: 117 QT Interval:  372 QTC Calculation: 461 R Axis:   30 Text Interpretation: Sinus rhythm LVH with  secondary repolarization abnormality minimal st elevations, anterior leads No acute changes when compared to last tracing Confirmed by Madalyn Rob [phone]) on 10/01/2021 6:42:41 PM  Radiology DG Chest 2 View  Result Date: 10/01/2021 CLINICAL DATA:  Shortness of breath.  Hypoxia.  Recent surgery. EXAM: CHEST - 2 VIEW COMPARISON:  None. FINDINGS: The cardiomediastinal silhouette is within normal limits. Aortic atherosclerosis is noted. The lateral image is degraded by superimposition of the patient's arm. There is mild elevation of the right hemidiaphragm. Mild atelectasis is noted in the lung bases. No edema,  sizeable pleural effusion, or pneumothorax is identified. No acute osseous abnormality is seen. IMPRESSION: Elevated right hemidiaphragm with mild bibasilar atelectasis. Electronically Signed   By: Logan Bores M.D.   On: 10/01/2021 12:50   CT Angio Chest PE W and/or Wo Contrast  Result Date: 10/01/2021 CLINICAL DATA:  Shortness of breath, history of recent shoulder surgery, initial encounter EXAM: CT ANGIOGRAPHY CHEST WITH CONTRAST TECHNIQUE: Multidetector CT imaging of the chest was performed using the standard protocol during bolus administration of intravenous contrast. Multiplanar CT image reconstructions and MIPs were obtained to evaluate the vascular anatomy. RADIATION DOSE REDUCTION: This exam was performed according to the departmental dose-optimization program which includes automated exposure control, adjustment of the mA and/or kV according to patient size and/or use of iterative reconstruction technique. CONTRAST:  46mL OMNIPAQUE IOHEXOL 350 MG/ML SOLN COMPARISON:  None. FINDINGS: Cardiovascular: Atherosclerotic calcifications are noted the thoracic aorta. No aneurysmal dilatation or dissection is noted. No cardiac enlargement is noted. Scattered coronary calcifications are noted. Pulmonary artery shows no filling defect to suggest pulmonary embolism. Mediastinum/Nodes: Thoracic inlet is  within normal limits. Esophagus as visualized is within normal limits. No sizable hilar or mediastinal adenopathy is noted. Lungs/Pleura: Lungs are well aerated bilaterally. No focal infiltrate or sizable effusion is seen. No sizable parenchymal nodules are noted. Minimal right basilar atelectasis is seen. Upper Abdomen: Visualized upper abdomen shows no acute abnormality. Gallbladder has been surgically removed. Musculoskeletal: No acute rib fracture is noted. Mild degenerative changes of the thoracic spine are seen. Review of the MIP images confirms the above findings. IMPRESSION: No evidence of pulmonary emboli. Mild right basilar atelectasis. No other focal abnormality is seen. Aortic Atherosclerosis (ICD10-I70.0). Electronically Signed   By: Inez Catalina M.D.   On: 10/01/2021 20:05    Procedures Procedures    Medications Ordered in ED Medications  iohexol (OMNIPAQUE) 350 MG/ML injection 80 mL (80 mLs Intravenous Contrast Given 10/01/21 1951)    ED Course/ Medical Decision Making/ A&P                           Medical Decision Making Amount and/or Complexity of Data Reviewed Labs: ordered. Radiology: ordered.  Risk Prescription drug management.   71 year old lady presented to ER with concern for shortness of breath.  Notably had recent rotator cuff surgery.  On exam, patient appears quite well and is in no acute distress with grossly stable vital signs.  Given her reported dyspnea, check broad work-up, basic labs grossly stable, no anemia was appreciated.  EKG did not have any acute change when compared to prior and her troponin was within normal limits, low suspicion for ACS.  D-dimer was checked given her recent surgery and dyspnea, elevated.  CT PE study per radiologist negative for pulmonary embolism.  Independently reviewed and agreed.  Cardiac monitor reviewed, demonstrating sinus rhythm. On reassessment patient remains well-appearing, she denies ongoing symptoms.  Given the  reassuring work-up thus far and her current well appearance with lack of ongoing symptoms, feel she can be discharged and managed in the outpatient setting and does not require admission.  We will give incentive spirometer for pulmonary toilet in the postoperative setting.  Recommend close follow-up with primary doctor.  Return precautions with patient and daughter, discharged.    After the discussed management above, the patient was determined to be safe for discharge.  The patient was in agreement with this plan and all questions regarding their care were answered.  ED  return precautions were discussed and the patient will return to the ED with any significant worsening of condition.         Final Clinical Impression(s) / ED Diagnoses Final diagnoses:  Shortness of breath    Rx / DC Orders ED Discharge Orders     None         Lucrezia Starch, MD 10/01/21 2200

## 2021-10-01 NOTE — ED Triage Notes (Signed)
Patient recently had rotator cuff surgery. Daughter measured her O2 today and it was in the 34s, called MD and they stated to come to the ED for a CXR. Reports mild SOB w/ laying flat. O2 98% RA in triage. New cough, dry and non-productive, no fevers.

## 2022-01-25 DIAGNOSIS — M2021 Hallux rigidus, right foot: Secondary | ICD-10-CM | POA: Insufficient documentation

## 2022-04-15 ENCOUNTER — Other Ambulatory Visit (HOSPITAL_BASED_OUTPATIENT_CLINIC_OR_DEPARTMENT_OTHER): Payer: Self-pay

## 2022-04-15 ENCOUNTER — Other Ambulatory Visit: Payer: Self-pay

## 2022-04-15 ENCOUNTER — Emergency Department (HOSPITAL_BASED_OUTPATIENT_CLINIC_OR_DEPARTMENT_OTHER)
Admission: EM | Admit: 2022-04-15 | Discharge: 2022-04-15 | Disposition: A | Discharge status: Home / Self Care | Payer: Medicare Other | Attending: Emergency Medicine | Admitting: Emergency Medicine

## 2022-04-15 ENCOUNTER — Encounter (HOSPITAL_BASED_OUTPATIENT_CLINIC_OR_DEPARTMENT_OTHER): Payer: Self-pay | Admitting: Emergency Medicine

## 2022-04-15 ENCOUNTER — Emergency Department (HOSPITAL_BASED_OUTPATIENT_CLINIC_OR_DEPARTMENT_OTHER): Payer: Medicare Other | Admitting: Radiology

## 2022-04-15 DIAGNOSIS — S39012A Strain of muscle, fascia and tendon of lower back, initial encounter: Secondary | ICD-10-CM | POA: Insufficient documentation

## 2022-04-15 DIAGNOSIS — Y93K1 Activity, walking an animal: Secondary | ICD-10-CM | POA: Diagnosis not present

## 2022-04-15 DIAGNOSIS — M549 Dorsalgia, unspecified: Secondary | ICD-10-CM

## 2022-04-15 DIAGNOSIS — X58XXXA Exposure to other specified factors, initial encounter: Secondary | ICD-10-CM | POA: Diagnosis not present

## 2022-04-15 DIAGNOSIS — S3992XA Unspecified injury of lower back, initial encounter: Secondary | ICD-10-CM | POA: Diagnosis present

## 2022-04-15 HISTORY — DX: Dermatitis, unspecified: L30.9

## 2022-04-15 MED ORDER — ACETAMINOPHEN 500 MG PO TABS
500.0000 mg | ORAL_TABLET | Freq: Four times a day (QID) | ORAL | 0 refills | Status: DC | PRN
  Filled 2022-04-15: qty 30, 8d supply, fill #0

## 2022-04-15 MED ORDER — TIZANIDINE HCL 2 MG PO TABS
2.0000 mg | ORAL_TABLET | Freq: Three times a day (TID) | ORAL | 0 refills | Status: DC | PRN
  Filled 2022-04-15: qty 15, 5d supply, fill #0

## 2022-04-15 MED ORDER — OXYCODONE-ACETAMINOPHEN 5-325 MG PO TABS
1.0000 | ORAL_TABLET | Freq: Once | ORAL | Status: AC
  Administered 2022-04-15: 1 via ORAL
  Filled 2022-04-15: qty 1

## 2022-04-15 MED ORDER — OXYCODONE-ACETAMINOPHEN 5-325 MG PO TABS
1.0000 | ORAL_TABLET | Freq: Three times a day (TID) | ORAL | 0 refills | Status: AC | PRN
Start: 2022-04-15 — End: 2022-04-18
  Filled 2022-04-15: qty 9, 3d supply, fill #0

## 2022-04-15 MED ORDER — LIDOCAINE 5 % EX PTCH
1.0000 | MEDICATED_PATCH | CUTANEOUS | Status: DC
  Administered 2022-04-15: 1 via TRANSDERMAL
  Filled 2022-04-15: qty 1

## 2022-04-15 MED ORDER — LIDOCAINE 4 % EX PTCH
1.0000 | MEDICATED_PATCH | Freq: Two times a day (BID) | CUTANEOUS | 0 refills | Status: DC
  Filled 2022-04-15: qty 10, 5d supply, fill #0

## 2022-04-15 MED ORDER — NAPROXEN 375 MG PO TABS
375.0000 mg | ORAL_TABLET | Freq: Two times a day (BID) | ORAL | 0 refills | Status: DC
  Filled 2022-04-15: qty 20, 10d supply, fill #0

## 2022-04-15 NOTE — ED Triage Notes (Signed)
Pt had bunion surgery 3 weeks ago, has been in a boot for 2 weeks. Her left hip started hurting. Pt starting having worse pain, her lower back started hurting as well . On Saturday she was walking her dog ( not really smart), and her dog was pulling on leash and hurt pts back. Pt has been to chirpopractor x2 this week, feels worse, cant hardly sit.

## 2022-04-15 NOTE — Discharge Instructions (Addendum)
Please take the medications that are prescribed for pain control.  As discussed, follow-up with [facility] tomorrow, and discussed with him the physical therapy session will be helpful for what appears to be flareup of your lower back pain or pain in your hip because of overuse.

## 2022-04-15 NOTE — ED Notes (Signed)
Patient Alert and oriented to baseline. Stable and ambulatory to baseline. Patient verbalized understanding of the discharge instructions.  Patient belongings were taken by the patient.   

## 2022-04-17 NOTE — ED Provider Notes (Signed)
[facility] DEPT Provider Note   CSN: [id number] Arrival date & time: 04/15/22  7517     History  Chief Complaint  Patient presents with   Back Pain    Judy Morrison is a 71 y.o. female.  HPI     71 year old female comes in with chief complaint of back pain.  Patient had a bunion surgery about 3 weeks ago.  She has been in a boot for the last 2 weeks.  She indicates that possibly due to overuse and imbalance, she has started having pain over her lower back.  Pain has intensified over time and today she was having difficulty even sitting up, prompting her to come to the ER.  She suspects that her dog pulling on the leash might have aggravated her symptoms.  She had gone to the chiropractor last 2 days, and on second day they aggressively tried to manipulate her back.  Pain is worsened since  She has been taking over-the-counter medications without significant relief.  Patient denies any associated numbness, tingling, urinary incontinence, retention, pins and needle sensation in the anogenital region.  Home Medications Prior to Admission medications   Medication Sig Start Date End Date Taking? Authorizing Provider  acetaminophen (TYLENOL) 500 MG tablet Take 1 tablet (500 mg total) by mouth every 6 (six) hours as needed. 04/15/22  Yes Janellie Tennison, MD  lidocaine 4 % Place 1 patch onto the skin 2 (two) times daily. 04/15/22  Yes Ramiel Forti, MD  naproxen (NAPROSYN) 375 MG tablet Take 1 tablet (375 mg total) by mouth 2 (two) times daily. 04/15/22  Yes Varney Biles, MD  oxyCODONE-acetaminophen (PERCOCET/ROXICET) 5-325 MG tablet Take 1 tablet by mouth every 8 (eight) hours as needed for up to 3 days for severe pain. 04/15/22 04/18/22 Yes Varney Biles, MD  tiZANidine (ZANAFLEX) 2 MG tablet Take 1 tablet (2 mg total) by mouth 3 (three) times daily as needed for muscle spasms. 04/15/22  Yes Mcdaniel Ohms, MD  Adapalene 0.3 % gel Apply 1 application daily as  needed topically (for acne).    [provider]  ALPRAZolam Duanne Moron) 0.25 MG tablet Take 0.25 mg by mouth daily as needed for anxiety.  04/14/18   [provider]  Brexpiprazole (REXULTI) 1 MG TABS Take 1 mg by mouth daily after breakfast.     [provider]  Cholecalciferol (VITAMIN D3) 1000 units CAPS Take 1,000 Units at bedtime by mouth.    [provider]  clonazePAM (KLONOPIN) 0.5 MG tablet Take 0.25 mg by mouth 2 (two) times daily.    [provider]  DULoxetine HCl 40 MG CPEP Take 40 mg by mouth daily after breakfast. 03/17/18   [provider]  hydrOXYzine (VISTARIL) 25 MG capsule Take 25-50 mg by mouth daily. 10/07/20   [provider]  latanoprost (XALATAN) 0.005 % ophthalmic solution Place 1 drop into both eyes at bedtime.    [provider]  levothyroxine (SYNTHROID, LEVOTHROID) 50 MCG tablet Take 50 mcg at bedtime by mouth.     [provider]  loratadine (CLARITIN) 10 MG tablet Take 10 mg by mouth daily.    [provider]  nortriptyline (PAMELOR) 50 MG capsule Take 100 mg at bedtime by mouth.  12/19/14   [provider]  omeprazole (PRILOSEC) 40 MG capsule Take 40 mg by mouth daily before breakfast.    [provider]  rosuvastatin (CRESTOR) 5 MG tablet Take 5 mg by mouth daily.    [provider]  valACYclovir (VALTREX) 1000 MG tablet Take 1,000 mg by mouth daily as needed.    [provider]  VITAMIN D, ERGOCALCIFEROL, PO Take 4,000 Units by mouth daily.    [provider]      Allergies    Codeine, Erythromycin, and Penicillins    Review of Systems   Review of Systems  All other systems reviewed and are negative.   Physical Exam Updated Vital Signs BP (!) 160/88   Pulse 99   Temp 97.7 F (36.5 C)   Resp 16   SpO2 99%  Physical Exam Vitals and nursing note reviewed.  Constitutional:      Appearance: She is well-developed.  HENT:      Head: Atraumatic.  Cardiovascular:     Rate and Rhythm: Normal rate.  Pulmonary:     Effort: Pulmonary effort is normal.  Musculoskeletal:     Cervical back: Normal range of motion and neck supple.     Comments: Patient has reproducible tenderness over the sacroiliac region, positive passive leg raise  Skin:    General: Skin is warm and dry.  Neurological:     Mental Status: She is alert and oriented to person, place, and time.     ED Results / Procedures / Treatments   Labs (all labs ordered are listed, but only abnormal results are displayed) Labs Reviewed - No data to display  EKG None  Radiology No results found.  Procedures Procedures    Medications Ordered in ED Medications  oxyCODONE-acetaminophen (PERCOCET/ROXICET) 5-325 MG per tablet 1 tablet (1 tablet Oral Given 04/15/22 1045)    ED Course/ Medical Decision Making/ A&P                           Medical Decision Making Amount and/or Complexity of Data Reviewed Radiology: ordered.  Risk OTC drugs. Prescription drug management.   71 year old female comes in with chief complaint of back pain. She is status post bunion surgery in her right lower extremity 3 weeks ago and has been using a boot for the last 2 weeks.  Over time, she started having this new back pain and left hip pain.  No red flags suggesting cord compression, cauda equina. She does not have any neuro complaints.  There is some radiation of the pain towards the gluteal region, therefore mild radicular component.  Differential diagnosis includes sacroiliitis, lumbar strain, soft tissue strain of the left hip.  Arthritis of the left hip.  Appropriate imaging was ordered.  X-rays interpreted independently, no clear evidence of any major misalignment.  Plan is to have her follow-up with [facility].  I think she will need supportive pharmacotherapy, but more importantly gait analysis and physical therapy.  Patient in agreement with the plan.  I  have consulted Dr. Doran Durand, [facility]ting.  They will see the patient and help her as needed right away.  Final Clinical Impression(s) / ED Diagnoses Final diagnoses:  Strain of lumbar region, initial encounter  Musculoskeletal back pain    Rx / DC Orders ED Discharge Orders          Ordered    oxyCODONE-acetaminophen (PERCOCET/ROXICET) 5-325 MG tablet  Every 8 hours PRN        04/15/22 1215    lidocaine 4 %  2 times daily        04/15/22 1215    naproxen (NAPROSYN) 375 MG tablet  2 times daily  04/15/22 1215    acetaminophen (TYLENOL) 500 MG tablet  Every 6 hours PRN        04/15/22 1215    tiZANidine (ZANAFLEX) 2 MG tablet  3 times daily PRN        04/15/22 1215              Varney Biles, MD 04/17/22 1700

## 2022-06-17 ENCOUNTER — Other Ambulatory Visit: Payer: Self-pay | Admitting: Registered Nurse

## 2022-06-17 DIAGNOSIS — Z1231 Encounter for screening mammogram for malignant neoplasm of breast: Secondary | ICD-10-CM

## 2022-08-02 ENCOUNTER — Ambulatory Visit
Admission: RE | Admit: 2022-08-02 | Discharge: 2022-08-02 | Disposition: A | Discharge status: Home / Self Care | Payer: Medicare Other | Source: Ambulatory Visit | Attending: Registered Nurse | Admitting: Registered Nurse

## 2022-08-02 DIAGNOSIS — Z1231 Encounter for screening mammogram for malignant neoplasm of breast: Secondary | ICD-10-CM

## 2022-08-09 ENCOUNTER — Other Ambulatory Visit: Payer: Self-pay | Admitting: Registered Nurse

## 2022-08-09 DIAGNOSIS — R928 Other abnormal and inconclusive findings on diagnostic imaging of breast: Secondary | ICD-10-CM

## 2022-08-20 ENCOUNTER — Ambulatory Visit
Admission: RE | Admit: 2022-08-20 | Discharge: 2022-08-20 | Disposition: A | Discharge status: Home / Self Care | Payer: Medicare Other | Source: Ambulatory Visit | Attending: Registered Nurse | Admitting: Registered Nurse

## 2022-08-20 ENCOUNTER — Ambulatory Visit: Payer: Medicare Other

## 2022-08-20 DIAGNOSIS — R928 Other abnormal and inconclusive findings on diagnostic imaging of breast: Secondary | ICD-10-CM

## 2022-08-23 ENCOUNTER — Other Ambulatory Visit: Payer: Self-pay | Admitting: Registered Nurse

## 2022-08-23 DIAGNOSIS — N6489 Other specified disorders of breast: Secondary | ICD-10-CM

## 2022-09-01 ENCOUNTER — Ambulatory Visit
Admission: RE | Admit: 2022-09-01 | Discharge: 2022-09-01 | Disposition: A | Discharge status: Home / Self Care | Payer: Medicare Other | Source: Ambulatory Visit | Attending: Registered Nurse | Admitting: Registered Nurse

## 2022-09-01 DIAGNOSIS — N6489 Other specified disorders of breast: Secondary | ICD-10-CM

## 2022-09-01 HISTORY — PX: BREAST BIOPSY: SHX20

## 2022-09-02 ENCOUNTER — Other Ambulatory Visit: Payer: Self-pay | Admitting: Registered Nurse

## 2022-09-02 DIAGNOSIS — C50919 Malignant neoplasm of unspecified site of unspecified female breast: Secondary | ICD-10-CM

## 2022-09-03 ENCOUNTER — Telehealth: Payer: Self-pay | Admitting: Hematology and Oncology

## 2022-09-03 NOTE — Telephone Encounter (Signed)
Spoke to patient to confirm upcoming morning [facility] clinic appointment on 1/3, paperwork will be sent via e-mail.   Gave location and time, also informed patient that the surgeon's office would be calling as well to get information from them similar to the packet that they will be receiving so make sure to do both.  Reminded patient that all providers will be coming to the clinic to see them HERE and if they had any questions to not hesitate to reach back out to myself or their navigators.

## 2022-09-10 ENCOUNTER — Encounter: Payer: Self-pay | Admitting: *Deleted

## 2022-09-10 DIAGNOSIS — Z170 Estrogen receptor positive status [ER+]: Secondary | ICD-10-CM

## 2022-09-10 HISTORY — DX: Malignant neoplasm of upper-outer quadrant of right female breast: Z17.0

## 2022-09-13 DIAGNOSIS — C50919 Malignant neoplasm of unspecified site of unspecified female breast: Secondary | ICD-10-CM

## 2022-09-13 HISTORY — DX: Malignant neoplasm of unspecified site of unspecified female breast: C50.919

## 2022-09-14 NOTE — Progress Notes (Signed)
Radiation Oncology         [phone] ________________________________  Name: Judy Morrison        MRN: [id number]  Date of Service: 09/15/2022 DOB: 08-23-1951  [facility], Reche Dixon, PA  Rolm Bookbinder, MD     REFERRING PHYSICIAN: Rolm Bookbinder, MD   DIAGNOSIS: There were no encounter diagnoses.   HISTORY OF PRESENT ILLNESS: Judy Morrison is a 72 y.o. female seen in the multidisciplinary clinic for a new diagnosis of right breast cancer. The patient was noted to have a screening detected asymmetry in the left breast. She returned for diagnostic imaging which showed persistent subtle distortion in the central portion of the right breast, slightly lateral aspect of the right breast warranting tissue diagnosis. Ultrasound performed on *** at the *** o'clock position there was a *** cm mass corresponding to the mammographic findings as well as ***.  Her axilla was *** for adenopathy.  She underwent a biopsy that showed *** grade *** that was ER/PR ***. She is seen today to discuss treatment recommendations of her cancer.  Today she is present with her supportive ***. Symptomatically, she ***   PREVIOUS RADIATION THERAPY: {EXAM; YES/NO:19492::"No"}   PAST MEDICAL HISTORY:  Past Medical History:  Diagnosis Date   Anemia    Anxiety    Arthritis    Depressed    Dermatitis    rare form, on prednisone   GERD (gastroesophageal reflux disease)    controlled with Omeprazole   Glaucoma    Torn rotator cuff    RIGHT    Urinary tract infection    dx 05-26-17 took 1 week cipro BID completed 06-02-17.       PAST SURGICAL HISTORY: Past Surgical History:  Procedure Laterality Date   ADENOIDECTOMY     APPENDECTOMY     arthroscopic right knee      BREAST BIOPSY Right 09/01/2022   MM RT BREAST BX W LOC DEV 1ST LESION IMAGE BX SPEC STEREO GUIDE 09/01/2022 [facility]   CHOLECYSTECTOMY     COLONOSCOPY  06/06/2017   [facility]   Pelvic sling     x2   POLYPECTOMY      ROTATOR CUFF REPAIR Right    TONSILLECTOMY     TOTAL KNEE ARTHROPLASTY Right 08/12/2017   Procedure: RIGHT TOTAL KNEE ARTHROPLASTY;  Surgeon: Sydnee Cabal, MD;  Location: [facility];  Service: Orthopedics;  Laterality: Right;   TOTAL KNEE ARTHROPLASTY Left 01/06/2018   Procedure: LEFT TOTAL KNEE ARTHROPLASTY;  Surgeon: Sydnee Cabal, MD;  Location: [facility];  Service: Orthopedics;  Laterality: Left;   UPPER GASTROINTESTINAL ENDOSCOPY     WISDOM TOOTH EXTRACTION       FAMILY HISTORY:  Family History  Problem Relation Age of Onset   Bowel Disease Mother        ishemic bowel   Colon cancer Neg Hx    Breast cancer Neg Hx    Esophageal cancer Neg Hx    Pancreatic cancer Neg Hx    Prostate cancer Neg Hx    Rectal cancer Neg Hx    Stomach cancer Neg Hx    Allergic rhinitis Neg Hx    Asthma Neg Hx    Eczema Neg Hx    Urticaria Neg Hx    Colon polyps Neg Hx      SOCIAL HISTORY:  reports that she quit smoking about 38 years ago. Her smoking use included cigarettes. She has never used smokeless tobacco. She reports current alcohol use of about  2.0 standard drinks of alcohol per week. She reports that she does not use drugs.   ALLERGIES: Codeine, Erythromycin, and Penicillins   MEDICATIONS:  Current Outpatient Medications  Medication Sig Dispense Refill   acetaminophen (TYLENOL) 500 MG tablet Take 1 tablet (500 mg total) by mouth every 6 (six) hours as needed. 30 tablet 0   Adapalene 0.3 % gel Apply 1 application daily as needed topically (for acne).     ALPRAZolam (XANAX) 0.25 MG tablet Take 0.25 mg by mouth daily as needed for anxiety.   1   Brexpiprazole (REXULTI) 1 MG TABS Take 1 mg by mouth daily after breakfast.      Cholecalciferol (VITAMIN D3) 1000 units CAPS Take 1,000 Units at bedtime by mouth.     clonazePAM (KLONOPIN) 0.5 MG tablet Take 0.25 mg by mouth 2 (two) times daily.     DULoxetine HCl 40 MG CPEP Take 40 mg by mouth daily after breakfast.  1   hydrOXYzine  (VISTARIL) 25 MG capsule Take 25-50 mg by mouth daily.     latanoprost (XALATAN) 0.005 % ophthalmic solution Place 1 drop into both eyes at bedtime.     levothyroxine (SYNTHROID, LEVOTHROID) 50 MCG tablet Take 50 mcg at bedtime by mouth.      lidocaine 4 % Place 1 patch onto the skin 2 (two) times daily. 10 patch 0   loratadine (CLARITIN) 10 MG tablet Take 10 mg by mouth daily.     naproxen (NAPROSYN) 375 MG tablet Take 1 tablet (375 mg total) by mouth 2 (two) times daily. 20 tablet 0   nortriptyline (PAMELOR) 50 MG capsule Take 100 mg at bedtime by mouth.      omeprazole (PRILOSEC) 40 MG capsule Take 40 mg by mouth daily before breakfast.     rosuvastatin (CRESTOR) 5 MG tablet Take 5 mg by mouth daily.     tiZANidine (ZANAFLEX) 2 MG tablet Take 1 tablet (2 mg total) by mouth 3 (three) times daily as needed for muscle spasms. 15 tablet 0   valACYclovir (VALTREX) 1000 MG tablet Take 1,000 mg by mouth daily as needed.     VITAMIN D, ERGOCALCIFEROL, PO Take 4,000 Units by mouth daily.     No current facility-administered medications for this visit.     REVIEW OF SYSTEMS: On review of systems, the patient reports that *** is doing well overall. *** denies any chest pain, shortness of breath, cough, fevers, chills, night sweats, unintended weight changes. *** denies any bowel or bladder disturbances, and denies abdominal pain, nausea or vomiting. *** denies any new musculoskeletal or joint aches or pains. A complete review of systems is obtained and is otherwise negative.     PHYSICAL EXAM:  Wt Readings from Last 3 Encounters:  11/06/20 158 lb (71.7 kg)  10/30/20 158 lb (71.7 kg)  09/15/20 157 lb (71.2 kg)   Temp Readings from Last 3 Encounters:  04/15/22 97.7 F (36.5 C)  10/01/21 98.3 F (36.8 C) (Oral)  11/06/20 98 F (36.7 C)   BP Readings from Last 3 Encounters:  04/15/22 (!) 160/88  10/01/21 131/74  11/06/20 113/73   Pulse Readings from Last 3 Encounters:  04/15/22 99   10/01/21 90  11/06/20 81    /10  In general this is a well appearing female in no acute distress. She's alert and oriented x4 and appropriate throughout the examination. Cardiopulmonary assessment is negative for acute distress and exhibits normal effort. Breast exam deferred.    ECOG = ***  0 - Asymptomatic (Fully active, able to carry on all predisease activities without restriction)  1 - Symptomatic but completely ambulatory (Restricted in physically strenuous activity but ambulatory and able to carry out work of a light or sedentary nature. For example, light housework, office work)  2 - Symptomatic, <50% in bed during the day (Ambulatory and capable of all self care but unable to carry out any work activities. Up and about more than 50% of waking hours)  3 - Symptomatic, >50% in bed, but not bedbound (Capable of only limited self-care, confined to bed or chair 50% or more of waking hours)  4 - Bedbound (Completely disabled. Cannot carry on any self-care. Totally confined to bed or chair)  5 - Death   Eustace Pen MM, Creech RH, Tormey DC, et al. [phone]). "Toxicity and response criteria of the [facility]". [place] Oncol. 5 (6): 649-55    LABORATORY DATA:  Lab Results  Component Value Date   WBC 10.1 10/01/2021   HGB 11.6 (L) 10/01/2021   HCT 35.7 (L) 10/01/2021   MCV 88.1 10/01/2021   PLT 276 10/01/2021   Lab Results  Component Value Date   NA 136 10/01/2021   K 3.4 (L) 10/01/2021   CL 101 10/01/2021   CO2 29 10/01/2021   Lab Results  Component Value Date   ALT 102 (H) 10/01/2021   AST 149 (H) 10/01/2021   ALKPHOS 145 (H) 10/01/2021   BILITOT 0.3 10/01/2021      RADIOGRAPHY: MM RT BREAST BX W LOC DEV 1ST LESION IMAGE BX SPEC STEREO GUIDE  Addendum Date: 09/02/2022   ADDENDUM REPORT: 09/02/2022 14:34 ADDENDUM: Pathology revealed: GRADE II INVASIVE MAMMARY CARCINOMA, MAMMARY CARCINOMA IN SITU, INTERMEDIATE GRADE of the RIGHT breast,  central, (X clip). This was found to be concordant by Dr. Peggye Fothergill. Pathology results were discussed with the patient by telephone. The patient reported doing well after the biopsy with tenderness at the site. Post biopsy instructions and care were reviewed and questions were answered. The patient was encouraged to call [facility] for any additional concerns. My direct phone number was provided. The patient was referred to [facility] on September 15, 2022. The patient is scheduled for a RIGHT axillary ultrasound on September 15, 2022 due to the invasive histology. Pathology results reported by Terie Purser, RN on 09/02/2022. Electronically Signed   By: Evangeline Dakin M.D.   On: 09/02/2022 14:34   Result Date: 09/02/2022 CLINICAL DATA:  72 year old with screening detected indeterminate architectural distortion involving the central RIGHT breast. EXAM: RIGHT BREAST STEREOTACTIC CORE NEEDLE BIOPSY 2D and 3D DIAGNOSTIC RIGHT MAMMOGRAM POST BIOPSY COMPARISON:  Previous exam(s). FINDINGS: The patient and I discussed the procedure of stereotactic-guided biopsy including benefits and alternatives. We discussed the high likelihood of a successful procedure. We discussed the risks of the procedure including infection, bleeding, tissue injury, clip migration, and inadequate sampling. Informed written consent was given. The usual time out protocol was performed immediately prior to the procedure. Lesion quadrant: Upper breast, near 12 o'clock location. Using sterile technique with chlorhexidine as skin antisepsis, 1% lidocaine and 1% lidocaine with epinephrine as local anesthetic, under stereotactic tomosynthesis guidance, a 9 gauge Brevera vacuum assisted device was used to perform core needle biopsy of architectural distortion in the upper breast using a superior approach. Specimen radiograph was performed showing  soft tissue density in all 7 of the core  samples. There are faint calcifications in at least 1 of the samples (sample labeled D). At the conclusion of the procedure, an X shaped tissue marker clip was deployed into the biopsy cavity. Follow-up 2D and 3D full field CC and mediolateral mammographic images were obtained to confirm clip placement. The X shaped tissue marking clip is appropriately positioned at the site of the biopsied distortion in the upper breast at middle depth, near 12 o'clock location. Expected post biopsy changes are present without evidence of hematoma. IMPRESSION: 1. Stereotactic tomosynthesis guided core needle biopsy of architectural distortion involving the upper RIGHT breast at the near 12 o'clock location. No apparent complications. 2. Appropriate positioning of the X shaped tissue marking clip at the site of the biopsied architectural distortion in the upper RIGHT breast. Electronically Signed: By: Evangeline Dakin M.D. On: 09/01/2022 09:38  MM CLIP PLACEMENT RIGHT  Addendum Date: 09/02/2022   ADDENDUM REPORT: 09/02/2022 14:34 ADDENDUM: Pathology revealed: GRADE II INVASIVE MAMMARY CARCINOMA, MAMMARY CARCINOMA IN SITU, INTERMEDIATE GRADE of the RIGHT breast, central, (X clip). This was found to be concordant by Dr. Peggye Fothergill. Pathology results were discussed with the patient by telephone. The patient reported doing well after the biopsy with tenderness at the site. Post biopsy instructions and care were reviewed and questions were answered. The patient was encouraged to call [facility] for any additional concerns. My direct phone number was provided. The patient was referred to [facility] at [facility] on September 15, 2022. The patient is scheduled for a RIGHT axillary ultrasound on September 15, 2022 due to the invasive histology. Pathology results reported by Terie Purser, RN on 09/02/2022.  Electronically Signed   By: Evangeline Dakin M.D.   On: 09/02/2022 14:34   Result Date: 09/02/2022 CLINICAL DATA:  72 year old with screening detected indeterminate architectural distortion involving the central RIGHT breast. EXAM: RIGHT BREAST STEREOTACTIC CORE NEEDLE BIOPSY 2D and 3D DIAGNOSTIC RIGHT MAMMOGRAM POST BIOPSY COMPARISON:  Previous exam(s). FINDINGS: The patient and I discussed the procedure of stereotactic-guided biopsy including benefits and alternatives. We discussed the high likelihood of a successful procedure. We discussed the risks of the procedure including infection, bleeding, tissue injury, clip migration, and inadequate sampling. Informed written consent was given. The usual time out protocol was performed immediately prior to the procedure. Lesion quadrant: Upper breast, near 12 o'clock location. Using sterile technique with chlorhexidine as skin antisepsis, 1% lidocaine and 1% lidocaine with epinephrine as local anesthetic, under stereotactic tomosynthesis guidance, a 9 gauge Brevera vacuum assisted device was used to perform core needle biopsy of architectural distortion in the upper breast using a superior approach. Specimen radiograph was performed showing soft tissue density in all 7 of the core samples. There are faint calcifications in at least 1 of the samples (sample labeled D). At the conclusion of the procedure, an X shaped tissue marker clip was deployed into the biopsy cavity. Follow-up 2D and 3D full field CC and mediolateral mammographic images were obtained to confirm clip placement. The X shaped tissue marking clip is appropriately positioned at the site of the biopsied distortion in the upper breast at middle depth, near 12 o'clock location. Expected post biopsy changes are present without evidence of hematoma. IMPRESSION: 1. Stereotactic tomosynthesis guided core needle biopsy of architectural distortion involving the upper RIGHT breast at the near 12 o'clock location.  No apparent complications. 2. Appropriate positioning of the X shaped tissue marking clip at the  site of the biopsied architectural distortion in the upper RIGHT breast. Electronically Signed: By: Evangeline Dakin M.D. On: 09/01/2022 09:38   MM DIAG BREAST TOMO UNI RIGHT  Result Date: 08/20/2022 CLINICAL DATA:  Patient returns after screening study for evaluation of possible RIGHT breast distortion. EXAM: DIGITAL DIAGNOSTIC UNILATERAL RIGHT MAMMOGRAM WITH TOMOSYNTHESIS TECHNIQUE: Right digital diagnostic mammography and breast tomosynthesis was performed. COMPARISON:  Previous exam(s). ACR Breast Density Category c: The breast tissue is heterogeneously dense, which may obscure small masses. FINDINGS: Additional 2-D and 3-D images are performed. These views confirm presence of subtle distortion in the central portion of the RIGHT breast, slightly LATERAL to the RIGHT nipple and best seen on craniocaudal projections. IMPRESSION: Persistent subtle distortion central slightly LATERAL aspect of the RIGHT breast warranting tissue diagnosis. RECOMMENDATION: Stereotactic biopsy of the RIGHT breast. I have discussed the findings and recommendations with the patient. If applicable, a reminder letter will be sent to the patient regarding the next appointment. BI-RADS CATEGORY  4: Suspicious. Electronically Signed   By: Nolon Nations M.D.   On: 08/20/2022 14:56      IMPRESSION/PLAN: 1. *** Dr. Lisbeth Renshaw discusses the pathology findings and reviews the nature of ***. The consensus from the breast conference includes ***. Dr. Lisbeth Renshaw would recommend external radiotherapy to the breast to reduce risks of local recurrence followed by antiestrogen therapy. We discussed the risks, benefits, short, and long term effects of radiotherapy, as well as the *** intent, and the patient is interested in proceeding. Dr. Lisbeth Renshaw discusses the delivery and logistics of radiotherapy and anticipates a course of *** weeks of radiotherapy to the  left breast with *** deep inspiration breath hold technique. We will see her back a few weeks after surgery to discuss the simulation process and anticipate we starting radiotherapy about 4-6 weeks after surgery.    In a visit lasting *** minutes, greater than 50% of the time was spent face to face discussing the patient's condition, in preparation for the discussion, and coordinating the patient's care.   The above documentation reflects my direct findings during this shared patient visit. Please see the separate note by Dr. Lisbeth Renshaw on this date for the remainder of the patient's plan of care.    Leona Singleton, [place]   **Disclaimer: This note was dictated with voice recognition software. Similar sounding words can inadvertently be transcribed and this note may contain transcription errors which may not have been corrected upon publication of note.**

## 2022-09-15 ENCOUNTER — Encounter: Payer: Self-pay | Admitting: *Deleted

## 2022-09-15 ENCOUNTER — Inpatient Hospital Stay (HOSPITAL_BASED_OUTPATIENT_CLINIC_OR_DEPARTMENT_OTHER): Payer: Medicare Other | Admitting: Genetic Counselor

## 2022-09-15 ENCOUNTER — Other Ambulatory Visit: Payer: Self-pay

## 2022-09-15 ENCOUNTER — Ambulatory Visit
Admission: RE | Admit: 2022-09-15 | Discharge: 2022-09-15 | Disposition: A | Discharge status: Home / Self Care | Payer: Medicare Other | Source: Ambulatory Visit | Attending: Registered Nurse | Admitting: Registered Nurse

## 2022-09-15 ENCOUNTER — Encounter: Payer: Self-pay | Admitting: Hematology and Oncology

## 2022-09-15 ENCOUNTER — Inpatient Hospital Stay: Payer: Medicare Other | Attending: Hematology and Oncology

## 2022-09-15 ENCOUNTER — Inpatient Hospital Stay: Payer: Medicare Other | Admitting: Licensed Clinical Social Worker

## 2022-09-15 ENCOUNTER — Ambulatory Visit: Payer: Medicare Other | Attending: General Surgery | Admitting: Physical Therapy

## 2022-09-15 ENCOUNTER — Inpatient Hospital Stay (HOSPITAL_BASED_OUTPATIENT_CLINIC_OR_DEPARTMENT_OTHER): Payer: Medicare Other | Admitting: Hematology and Oncology

## 2022-09-15 ENCOUNTER — Encounter: Payer: Self-pay | Admitting: Physical Therapy

## 2022-09-15 ENCOUNTER — Other Ambulatory Visit: Payer: Self-pay | Admitting: Registered Nurse

## 2022-09-15 ENCOUNTER — Ambulatory Visit
Admission: RE | Admit: 2022-09-15 | Discharge: 2022-09-15 | Disposition: A | Discharge status: Home / Self Care | Payer: Medicare Other | Source: Ambulatory Visit | Attending: Radiation Oncology | Admitting: Radiation Oncology

## 2022-09-15 DIAGNOSIS — R293 Abnormal posture: Secondary | ICD-10-CM | POA: Insufficient documentation

## 2022-09-15 DIAGNOSIS — Z170 Estrogen receptor positive status [ER+]: Secondary | ICD-10-CM

## 2022-09-15 DIAGNOSIS — C50411 Malignant neoplasm of upper-outer quadrant of right female breast: Secondary | ICD-10-CM

## 2022-09-15 DIAGNOSIS — C50919 Malignant neoplasm of unspecified site of unspecified female breast: Secondary | ICD-10-CM

## 2022-09-15 DIAGNOSIS — G8929 Other chronic pain: Secondary | ICD-10-CM | POA: Insufficient documentation

## 2022-09-15 DIAGNOSIS — Z87891 Personal history of nicotine dependence: Secondary | ICD-10-CM | POA: Diagnosis not present

## 2022-09-15 DIAGNOSIS — M25511 Pain in right shoulder: Secondary | ICD-10-CM | POA: Diagnosis present

## 2022-09-15 DIAGNOSIS — M25611 Stiffness of right shoulder, not elsewhere classified: Secondary | ICD-10-CM | POA: Insufficient documentation

## 2022-09-15 DIAGNOSIS — R599 Enlarged lymph nodes, unspecified: Secondary | ICD-10-CM

## 2022-09-15 LAB — CBC WITH DIFFERENTIAL (CANCER CENTER ONLY)
Abs Immature Granulocytes: 0.02 10*3/uL (ref 0.00–0.07)
Basophils Absolute: 0.1 10*3/uL (ref 0.0–0.1)
Basophils Relative: 1 %
Eosinophils Absolute: 0.2 10*3/uL (ref 0.0–0.5)
Eosinophils Relative: 2 %
HCT: 35.1 % — ABNORMAL LOW (ref 36.0–46.0)
Hemoglobin: 11.7 g/dL — ABNORMAL LOW (ref 12.0–15.0)
Immature Granulocytes: 0 %
Lymphocytes Relative: 24 %
Lymphs Abs: 1.8 10*3/uL (ref 0.7–4.0)
MCH: 27.3 pg (ref 26.0–34.0)
MCHC: 33.3 g/dL (ref 30.0–36.0)
MCV: 81.8 fL (ref 80.0–100.0)
Monocytes Absolute: 1 10*3/uL (ref 0.1–1.0)
Monocytes Relative: 13 %
Neutro Abs: 4.4 10*3/uL (ref 1.7–7.7)
Neutrophils Relative %: 60 %
Platelet Count: 337 10*3/uL (ref 150–400)
RBC: 4.29 MIL/uL (ref 3.87–5.11)
RDW: 13.9 % (ref 11.5–15.5)
WBC Count: 7.5 10*3/uL (ref 4.0–10.5)
nRBC: 0 % (ref 0.0–0.2)

## 2022-09-15 LAB — CMP (CANCER CENTER ONLY)
ALT: 14 U/L (ref 0–44)
AST: 15 U/L (ref 15–41)
Albumin: 4.1 g/dL (ref 3.5–5.0)
Alkaline Phosphatase: 89 U/L (ref 38–126)
Anion gap: 7 (ref 5–15)
BUN: 16 mg/dL (ref 8–23)
CO2: 28 mmol/L (ref 22–32)
Calcium: 9.3 mg/dL (ref 8.9–10.3)
Chloride: 102 mmol/L (ref 98–111)
Creatinine: 0.85 mg/dL (ref 0.44–1.00)
GFR, Estimated: >60 mL/min (ref >60)
Glucose, Bld: 75 mg/dL (ref 70–99)
Potassium: 3.9 mmol/L (ref 3.5–5.1)
Sodium: 137 mmol/L (ref 135–145)
Total Bilirubin: 0.4 mg/dL (ref 0.3–1.2)
Total Protein: 6.2 g/dL — ABNORMAL LOW (ref 6.5–8.1)

## 2022-09-15 LAB — GENETIC SCREENING ORDER

## 2022-09-15 NOTE — Research (Signed)
Trial:  S2205, ICE COMPRESS: RANDOMIZED TRIAL OF LIMB CRYOCOMPRESSION VERSUS CONTINUOUS COMPRESSION VERSUS LOW CYCLIC COMPRESSION FOR THE PREVENTION  OF TAXANE-INDUCED PERIPHERAL NEUROPATHY Patient Judy Morrison was identified by Dr Chryl Heck as a potential candidate for the above listed study.  This Clinical Research Nurse met with [name], [id number], on 09/15/22 in a manner and location that ensures patient privacy to discuss participation in the above listed research study.  Patient is Accompanied by her 2 daughters .  A copy of the informed consent document with embedded HIPAA language was provided to the patient.  Patient reads, speaks, and understands [place].   Patient was provided with the business card of this Nurse and encouraged to contact the research team with any questions.  Approximately 15 minutes were spent with the patient reviewing the informed consent documents.  Patient was provided the option of taking informed consent documents home to review and was encouraged to review at their convenience with their support network, including other care providers. Patient took the consent documents home to review. Plan made to follow up with patient by phone on Monday 09/20/22. Confirmed phone number [phone]. Marjie Skiff Kashara Blocher, RN, BSN, [facility]  [facility]  [facility] Clinical Research Nurse [place] [phone]  Pager [phone] 09/15/2022 12:47 PM

## 2022-09-15 NOTE — Assessment & Plan Note (Addendum)
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 referred to breast [place] for additional recommendations. Upon further review, radiology was able to assess the tumor size to be closer to 15 mm.  Given tumor size under 2 cm, if the lymph node is indeed sonographically negative, it is reasonable to proceed with surgery first followed by adjuvant chemotherapy with Taxol and Herceptin.  I have today discussed the schedule of chemotherapy which is weekly for the first 12 weeks followed by every 21-day Herceptin.  We have today discussed about the adverse effects of chemotherapy including but not limited to fatigue, nausea, vomiting, diarrhea, increased risk of infections, cardiotoxicity, neuropathy, hair loss etc.  She understands that some of the side effects can be permanent and life-threatening.  We have discussed about cold cap, she is not quite sure if she is interested in this approach at this time.  I have also discussed about the SWOG study for cooling devices and reducing the risk of peripheral neuropathy.  She is interested in the study. She understands that she needs to continue Herceptin for a total of 1 year.  She understands the need for echocardiograms every 3 months and the small risk of cardiotoxicity which is mostly reversible.  After completion of radiation, she will start on antiestrogen therapy for 5 years. All her questions were answered to the best of my knowledge.  Thank you for consulting [place] in the care of this patient.  Please do not hesitate to contact us with any questions or concerns.

## 2022-09-15 NOTE — Progress Notes (Signed)
[facility] CONSULT NOTE  Patient Care Team: Colin Mulders, PA as PCP - General (Family Medicine) Shan Levans, Alcario Drought, MD (Inactive) as Resident (Family Medicine) Benay Pike, MD as Consulting Physician (Hematology and Oncology) Kyung Rudd, MD as Consulting Physician (Radiation Oncology) Rolm Bookbinder, MD as Consulting Physician (General Surgery)  CHIEF COMPLAINTS/PURPOSE OF CONSULTATION:  Newly diagnosed breast cancer  HISTORY OF PRESENTING ILLNESS:  Judy Morrison 72 y.o. female is here because of recent diagnosis of right breast cancer  I reviewed her records extensively and collaborated the history with the patient.  SUMMARY OF ONCOLOGIC HISTORY: Oncology History  Malignant neoplasm of upper-outer quadrant of right breast in female, estrogen receptor positive ([place])  08/02/2022 Mammogram   In the right breast, possible distortion warrants further evaluation. In the left breast, no findings suspicious for malignancy. Diagnostic mammogram showed persistent subtle distortion central slightly LATERAL aspect of the RIGHT breast warranting tissue diagnosis.     09/01/2022 Pathology Results   Final pathology showed grade 2 invasive mammary carcinoma, prognostic showed ER 90% positive strong staining PR 90% positive strong staining, Ki-67 of 5% and HER2 3+ by [facility]   09/10/2022 Initial Diagnosis   Malignant neoplasm of upper-outer quadrant of right breast in female, estrogen receptor positive ([place])    She is now referred to breast [place] for additional recommendations. She is here with her two daughters.  She has chronic itching/pruritus and takes tacrolimus and prednisone, followed by dermatology. She is otherwise active at baseline.  She had 3 kids, used hormone replacement therapy and birth control for short time about a year many years ago. Rest of the pertinent 10 point ROS reviewed and negative.  MEDICAL HISTORY:  Past Medical History:  Diagnosis Date    Anemia    Anxiety    Arthritis    Depressed    Dermatitis    rare form, on prednisone   GERD (gastroesophageal reflux disease)    controlled with Omeprazole   Glaucoma    Torn rotator cuff    RIGHT    Urinary tract infection    dx 05-26-17 took 1 week cipro BID completed 06-02-17.    SURGICAL HISTORY: Past Surgical History:  Procedure Laterality Date   ADENOIDECTOMY     APPENDECTOMY     arthroscopic right knee      BREAST BIOPSY Right 09/01/2022   MM RT BREAST BX W LOC DEV 1ST LESION IMAGE BX SPEC STEREO GUIDE 09/01/2022 [facility]   CHOLECYSTECTOMY     COLONOSCOPY  06/06/2017   [place]   Pelvic sling     x2   POLYPECTOMY     ROTATOR CUFF REPAIR Right    TONSILLECTOMY     TOTAL KNEE ARTHROPLASTY Right 08/12/2017   Procedure: RIGHT TOTAL KNEE ARTHROPLASTY;  Surgeon: Sydnee Cabal, MD;  Location: [facility];  Service: Orthopedics;  Laterality: Right;   TOTAL KNEE ARTHROPLASTY Left 01/06/2018   Procedure: LEFT TOTAL KNEE ARTHROPLASTY;  Surgeon: Sydnee Cabal, MD;  Location: [facility];  Service: Orthopedics;  Laterality: Left;   UPPER GASTROINTESTINAL ENDOSCOPY     WISDOM TOOTH EXTRACTION      SOCIAL HISTORY: Social History   Socioeconomic History   Marital status: Single    Spouse name: Not on file   Number of children: Not on file   Years of education: Not on file   Highest education level: Not on file  Occupational History   Not on file  Tobacco Use   Smoking status: Former  Types: Cigarettes    Quit date: 38    Years since quitting: 38.0   Smokeless tobacco: Never  Vaping Use   Vaping Use: Never used  Substance and Sexual Activity   Alcohol use: Yes    Alcohol/week: 2.0 standard drinks of alcohol    Types: 2 Cans of beer per week   Drug use: No   Sexual activity: Not on file  Other Topics Concern   Not on file  Social History Narrative   Not on file   Social Determinants of Health   Financial Resource Strain: Not on file  Food  Insecurity: Not on file  Transportation Needs: Not on file  Physical Activity: Not on file  Stress: Not on file  Social Connections: Not on file  Intimate Partner Violence: Not on file    FAMILY HISTORY: Family History  Problem Relation Age of Onset   Bowel Disease Mother        ishemic bowel   Colon cancer Neg Hx    Breast cancer Neg Hx    Esophageal cancer Neg Hx    Pancreatic cancer Neg Hx    Prostate cancer Neg Hx    Rectal cancer Neg Hx    Stomach cancer Neg Hx    Allergic rhinitis Neg Hx    Asthma Neg Hx    Eczema Neg Hx    Urticaria Neg Hx    Colon polyps Neg Hx     ALLERGIES:  is allergic to other, cephalexin, codeine, erythromycin, penicillin g, and penicillins.  MEDICATIONS:  Current Outpatient Medications  Medication Sig Dispense Refill   clotrimazole (MYCELEX) 10 MG troche Take 10 mg by mouth as directed. 2 times per week     predniSONE (DELTASONE) 2.5 MG tablet Take by mouth.     simvastatin (ZOCOR) 10 MG tablet Take 10 mg by mouth daily.     TACROLIMUS PO Take 1 mg by mouth 2 (two) times daily. Per patient - Dissolve capsule in one half liter of H2O. Swish and spit twice daily     acetaminophen (TYLENOL) 500 MG tablet Take 1 tablet (500 mg total) by mouth every 6 (six) hours as needed. 30 tablet 0   Adapalene 0.3 % gel Apply 1 application daily as needed topically (for acne).     ALPRAZolam (XANAX) 0.25 MG tablet Take 0.25 mg by mouth daily as needed for anxiety.   1   Brexpiprazole (REXULTI) 1 MG TABS Take 1 mg by mouth daily after breakfast.      Cholecalciferol (VITAMIN D3) 1000 units CAPS Take 1,000 Units at bedtime by mouth.     clonazePAM (KLONOPIN) 0.5 MG tablet Take 0.25 mg by mouth 2 (two) times daily.     DULoxetine HCl 40 MG CPEP Take 40 mg by mouth daily after breakfast.  1   hydrOXYzine (VISTARIL) 25 MG capsule Take 25-50 mg by mouth daily.     latanoprost (XALATAN) 0.005 % ophthalmic solution Place 1 drop into both eyes at bedtime.      levothyroxine (SYNTHROID, LEVOTHROID) 50 MCG tablet Take 50 mcg at bedtime by mouth.      lidocaine 4 % Place 1 patch onto the skin 2 (two) times daily. 10 patch 0   loratadine (CLARITIN) 10 MG tablet Take 10 mg by mouth daily.     naproxen (NAPROSYN) 375 MG tablet Take 1 tablet (375 mg total) by mouth 2 (two) times daily. 20 tablet 0   nortriptyline (PAMELOR) 50 MG capsule Take 100 mg  at bedtime by mouth.      omeprazole (PRILOSEC) 40 MG capsule Take 40 mg by mouth daily before breakfast.     rosuvastatin (CRESTOR) 5 MG tablet Take 5 mg by mouth daily.     tiZANidine (ZANAFLEX) 2 MG tablet Take 1 tablet (2 mg total) by mouth 3 (three) times daily as needed for muscle spasms. 15 tablet 0   valACYclovir (VALTREX) 1000 MG tablet Take 1,000 mg by mouth daily as needed.     VITAMIN D, ERGOCALCIFEROL, PO Take 4,000 Units by mouth daily.     No current facility-administered medications for this visit.    REVIEW OF SYSTEMS:   Constitutional: Denies fevers, chills or abnormal night sweats Eyes: Denies blurriness of vision, double vision or watery eyes Ears, nose, mouth, throat, and face: Denies mucositis or sore throat Respiratory: Denies cough, dyspnea or wheezes Cardiovascular: Denies palpitation, chest discomfort or lower extremity swelling Gastrointestinal:  Denies nausea, heartburn or change in bowel habits Skin: Denies abnormal skin rashes Lymphatics: Denies new lymphadenopathy or easy bruising Neurological:Denies numbness, tingling or new weaknesses Behavioral/Psych: Mood is stable, no new changes  Breast: Denies any palpable lumps or discharge All other systems were reviewed with the patient and are negative.  PHYSICAL EXAMINATION: ECOG PERFORMANCE STATUS: 0 - Asymptomatic  Vitals:   09/15/22 0850  BP: (!) 147/76  Pulse: (!) 106  Resp: 18  Temp: 97.7 F (36.5 C)  SpO2: 97%   Filed Weights   09/15/22 0850  Weight: 162 lb 12.8 oz (73.8 kg)    GENERAL:alert, no distress and  comfortable SKIN: skin color, texture, turgor are normal, no rashes or significant lesions EYES: normal, conjunctiva are pink and non-injected, sclera clear OROPHARYNX:no exudate, no erythema and lips, buccal mucosa, and tongue normal  NECK: supple, thyroid normal size, non-tender, without nodularity LYMPH:  no palpable lymphadenopathy in the cervical, axillary or inguinal LUNGS: clear to auscultation and percussion with normal breathing effort HEART: regular rate & rhythm and no murmurs and no lower extremity edema ABDOMEN:abdomen soft, non-tender and normal bowel sounds Musculoskeletal:no cyanosis of digits and no clubbing  PSYCH: alert & oriented x 3 with fluent speech NEURO: no focal motor/sensory deficits BREAST: No palpable breast masses.  No regional adenopathy.  LABORATORY DATA:  I have reviewed the data as listed Lab Results  Component Value Date   WBC 7.5 09/15/2022   HGB 11.7 (L) 09/15/2022   HCT 35.1 (L) 09/15/2022   MCV 81.8 09/15/2022   PLT 337 09/15/2022   Lab Results  Component Value Date   NA 137 09/15/2022   K 3.9 09/15/2022   CL 102 09/15/2022   CO2 28 09/15/2022    RADIOGRAPHIC STUDIES: I have personally reviewed the radiological reports and agreed with the findings in the report.  ASSESSMENT AND PLAN:  Malignant neoplasm of upper-outer quadrant of right breast in female, estrogen receptor positive ([place]) This is a very pleasant 72 year old postmenopausal patient with newly diagnosed right breast IDC, ER and PR positive, HER2 positive by IHC, Ki 67 of 5%? referred to breast [place] for additional recommendations. Upon further review, radiology was able to assess the tumor size to be closer to 15 mm.  Given tumor size under 2 cm, if the lymph node is indeed sonographically negative, it is reasonable to proceed with surgery first followed by adjuvant chemotherapy with Taxol and Herceptin.  I have today discussed the schedule of chemotherapy which is weekly for the  first 12 weeks followed by every 21-day Herceptin.  We have today discussed about the adverse effects of chemotherapy including but not limited to fatigue, nausea, vomiting, diarrhea, increased risk of infections, cardiotoxicity, neuropathy, hair loss etc.  She understands that some of the side effects can be permanent and life-threatening.  We have discussed about cold cap, she is not quite sure if she is interested in this approach at this time.  I have also discussed about the SWOG study for cooling devices and reducing the risk of peripheral neuropathy.  She is interested in the study. She understands that she needs to continue Herceptin for a total of 1 year.  She understands the need for echocardiograms every 3 months and the small risk of cardiotoxicity which is mostly reversible.  After completion of radiation, she will start on antiestrogen therapy for 5 years. All her questions were answered to the best of my knowledge.  Thank you for consulting [place] in the care of this patient.  Please do not hesitate to contact us with any questions or concerns.   Total time spent: 60 minutes including history, physical exam, review of records, counseling and coordination of care All questions were answered. The patient knows to call the clinic with any problems, questions or concerns.    Benay Pike, MD 09/15/22

## 2022-09-15 NOTE — Research (Signed)
Trial:  [employer] 2021-05 - Specimen Collection Study to Evaluate Biomarkers in Subjects with Cancer  Patient Judy Morrison was identified by Dr Chryl Heck as a potential candidate for the above listed study.  This Clinical Research Nurse met with Judy Morrison, [id number], on 09/15/22 in a manner and location that ensures patient privacy to discuss participation in the above listed research study.  Patient is Accompanied by her 2 daughters .  A copy of the informed consent document with embedded HIPAA language was provided to the patient.  Patient reads, speaks, and understands [place].   Patient was provided with the business card of this Nurse and encouraged to contact the research team with any questions.  Approximately 10 minutes were spent with the patient reviewing the informed consent documents.  Patient was provided the option of taking informed consent documents home to review and was encouraged to review at their convenience with their support network, including other care providers. Patient took the consent documents home to review.  Marjie Skiff Delta Deshmukh, RN, BSN, [facility]  Her  [facility] Clinical Research Nurse [facility] Direct Dial [phone]  Pager [phone] 09/15/2022 12:46 PM

## 2022-09-15 NOTE — Therapy (Signed)
OUTPATIENT PHYSICAL THERAPY BREAST CANCER BASELINE EVALUATION   Patient Name: Judy Morrison MRN: [id number] DOB:09-17-1950, 72 y.o., female Today's Date: 09/15/2022  END OF SESSION:  PT End of Session - 09/15/22 1125     Visit Number 1    Number of Visits 2    Date for PT Re-Evaluation 11/10/22    PT Start Time 1012    PT Stop Time 3570   Also saw pt from 1105-1115 for a total of 42 min   PT Time Calculation (min) 32 min    Activity Tolerance Patient tolerated treatment well    Behavior During Therapy [facility] for tasks assessed/performed             Past Medical History:  Diagnosis Date   Anemia    Anxiety    Arthritis    Depressed    Dermatitis    rare form, on prednisone   GERD (gastroesophageal reflux disease)    controlled with Omeprazole   Glaucoma    Torn rotator cuff    RIGHT    Urinary tract infection    dx 05-26-17 took 1 week cipro BID completed 06-02-17.   Past Surgical History:  Procedure Laterality Date   ADENOIDECTOMY     APPENDECTOMY     arthroscopic right knee      BREAST BIOPSY Right 09/01/2022   MM RT BREAST BX W LOC DEV 1ST LESION IMAGE BX SPEC STEREO GUIDE 09/01/2022 [facility]   CHOLECYSTECTOMY     COLONOSCOPY  06/06/2017   [facility]rt[facility]   Pelvic sling     x2   POLYPECTOMY     ROTATOR CUFF REPAIR Right    TONSILLECTOMY     TOTAL KNEE ARTHROPLASTY Right 08/12/2017   Procedure: RIGHT TOTAL KNEE ARTHROPLASTY;  Surgeon: Sydnee Cabal, MD;  Location: [facility];  Service: Orthopedics;  Laterality: Right;   TOTAL KNEE ARTHROPLASTY Left 01/06/2018   Procedure: LEFT TOTAL KNEE ARTHROPLASTY;  Surgeon: Sydnee Cabal, MD;  Location: [facility];  Service: Orthopedics;  Laterality: Left;   UPPER GASTROINTESTINAL ENDOSCOPY     WISDOM TOOTH EXTRACTION     Patient Active Problem List   Diagnosis Date Noted   Malignant neoplasm of upper-outer quadrant of right breast in female, estrogen receptor positive ([place]) 09/10/2022   Rash and other nonspecific  skin eruption 09/15/2020   Allergic contact dermatitis 09/15/2020   Pruritus 08/14/2020   Dizziness 05/10/2018   Atypical chest pain 05/10/2018   Primary osteoarthritis of left knee 01/06/2018   S/P knee replacement 08/12/2017   Incomplete tear of right rotator cuff 05/19/2017   Osteopenia 05/19/2017   Urge incontinence of urine 05/19/2017   Primary open angle glaucoma (POAG) 12/30/2016   Primary osteoarthritis of both knees 12/30/2016   Recurrent oral herpes simplex infection 12/30/2016   Tubular adenoma of colon 12/30/2016   Hypothyroidism 06/25/2016   Right foot pain 12/19/2014   Anxiety 05/21/2011   Seasonal allergies 05/21/2011    REFERRING PROVIDER: Dr. Rolm Bookbinder  REFERRING DIAG: Right breast cancer  THERAPY DIAG:  Malignant neoplasm of upper-outer quadrant of right breast in female, estrogen receptor positive ([place])  Abnormal posture  Chronic right shoulder pain  Stiffness of right shoulder, not elsewhere classified  Rationale for Evaluation and Treatment: Rehabilitation  ONSET DATE: 08/02/2022  SUBJECTIVE:  SUBJECTIVE STATEMENT: Patient reports she is here today to be seen by her medical team for her newly diagnosed right breast cancer.   PERTINENT HISTORY:  Patient was diagnosed on 08/02/2022 with right grade 2 invasive ductal carcinoma breast cancer. It measures 1.5 cm and is located in the upper outer quadrant. It is triple positive with a Ki67 of 5%. She had a rotator cuff repair in her right shoulder 10/01/2021 which continues to be problematic.   PATIENT GOALS:   reduce lymphedema risk and learn post op HEP.   PAIN:  Are you having pain? Yes: NPRS scale: 7/10 Pain location: Rihgt shoulder Pain description: sharp Aggravating factors: reaching Relieving factors:  rest  PRECAUTIONS: Active CA Other: Painful right shoulder from rotator cuff repair  HAND DOMINANCE: right  WEIGHT BEARING RESTRICTIONS: No  FALLS:  Has patient fallen in last 6 months? Yes. Number of falls 1 - fell while walking dog; got tangled up. Also reports some unsteadiness with her gait. Discussed incorporating balance rehab post operatively.  LIVING ENVIRONMENT: Patient lives with: herself Lives in: House/apartment Has following equipment at home: None  OCCUPATION: retired  LEISURE: She walks 3x/week for about 45 min  PRIOR LEVEL OF FUNCTION: Independent   OBJECTIVE:  COGNITION: Overall cognitive status: Within functional limits for tasks assessed    POSTURE:  Forward head and rounded shoulders posture  UPPER EXTREMITY AROM/PROM:  A/PROM RIGHT   eval   Shoulder extension 37  Shoulder flexion 112 and painful  Shoulder abduction 140 and painful  Shoulder internal rotation 39 with scapular compensation  Shoulder external rotation 67 with scapular compensation    (Blank rows = not tested)  A/PROM LEFT   eval  Shoulder extension 51  Shoulder flexion 151  Shoulder abduction 158  Shoulder internal rotation 59  Shoulder external rotation 74    (Blank rows = not tested)  CERVICAL AROM: All within normal limits  UPPER EXTREMITY STRENGTH: WNL  LYMPHEDEMA ASSESSMENTS:   LANDMARK RIGHT   eval  10 cm proximal to olecranon process 26.8  Olecranon process 22.6  10 cm proximal to ulnar styloid process 20.2  Just proximal to ulnar styloid process 14.5  Across hand at thumb web space 17.5  At base of 2nd digit 5.9  (Blank rows = not tested)  LANDMARK LEFT   eval  10 cm proximal to olecranon process 27.6  Olecranon process 22.8  10 cm proximal to ulnar styloid process 19.8  Just proximal to ulnar styloid process 14.3  Across hand at thumb web space 18.3  At base of 2nd digit 5.7  (Blank rows = not tested)  L-DEX LYMPHEDEMA SCREENING:  The patient  was assessed using the L-Dex machine today to produce a lymphedema index baseline score. The patient will be reassessed on a regular basis (typically every 3 months) to obtain new L-Dex scores. If the score is > 6.5 points away from his/her baseline score indicating onset of subclinical lymphedema, it will be recommended to wear a compression garment for 4 weeks, 12 hours per day and then be reassessed. If the score continues to be > 6.5 points from baseline at reassessment, we will initiate lymphedema treatment. Assessing in this manner has a 95% rate of preventing clinically significant lymphedema.   L-DEX FLOWSHEETS - 09/15/22 1100       L-DEX LYMPHEDEMA SCREENING   Measurement Type Unilateral    L-DEX MEASUREMENT EXTREMITY Upper Extremity    POSITION  Standing    DOMINANT SIDE Right  At Risk Side Right    BASELINE SCORE (UNILATERAL) -3.4             QUICK DASH SURVEY:  Katina Dung - 09/15/22 0001     Open a tight or new jar Moderate difficulty    Do heavy household chores (wash walls, wash floors) Unable    Carry a shopping bag or briefcase No difficulty    Wash your back Mild difficulty    Use a knife to cut food No difficulty    Recreational activities in which you take some force or impact through your arm, shoulder, or hand (golf, hammering, tennis) Unable    During the past week, to what extent has your arm, shoulder or hand problem interfered with your normal social activities with family, friends, neighbors, or groups? Modererately    During the past week, to what extent has your arm, shoulder or hand problem limited your work or other regular daily activities Modererately    Arm, shoulder, or hand pain. Moderate    Tingling (pins and needles) in your arm, shoulder, or hand None    Difficulty Sleeping No difficulty    DASH Score 38.64 %              PATIENT EDUCATION:  Education details: Lymphedema risk reduction and post op shoulder/posture HEP Person  educated: Patient Education method: Explanation, Demonstration, Handout Education comprehension: Patient verbalized understanding and returned demonstration  HOME EXERCISE PROGRAM: Patient was instructed today in a home exercise program today for post op shoulder range of motion. These included active assist shoulder flexion in sitting, scapular retraction, wall walking with shoulder abduction, and hands behind head external rotation.  She was encouraged to do these twice a day, holding 3 seconds and repeating 5 times when permitted by her physician.   ASSESSMENT:  CLINICAL IMPRESSION: Patient was diagnosed on 08/02/2022 with right grade 2 invasive ductal carcinoma breast cancer. It measures 1.5 cm and is located in the upper outer quadrant. It is triple positive with a Ki67 of 5%. She had a rotator cuff repair in her right shoulder 10/01/2021 which continues to be problematic. Her multidisciplinary medical team met prior to her assessments to determine a recommended treatment plan. She is planning to have a right breast lumpectomy and sentinel node biopsy followed by chemotherapy, radiation, and anti-estrogen therapy. She will benefit from a post op PT reassessment to determine needs and from L-Dex screens every 3 months for 2 years to detect subclinical lymphedema.  Pt will benefit from skilled therapeutic intervention to improve on the following deficits: Decreased knowledge of precautions, impaired UE functional use, pain, decreased ROM, postural dysfunction.   PT treatment/interventions: ADL/self-care home management, pt/family education, therapeutic exercise  REHAB POTENTIAL: Excellent  CLINICAL DECISION MAKING: Stable/uncomplicated  EVALUATION COMPLEXITY: Low   GOALS: Goals reviewed with patient? YES  LONG TERM GOALS: (STG=LTG)    Name Target Date Goal status  1 Pt will be able to verbalize understanding of pertinent lymphedema risk reduction practices relevant to her dx  specifically related to skin care.  Baseline:  No knowledge 09/15/2022 Achieved at eval  2 Pt will be able to return demo and/or verbalize understanding of the post op HEP related to regaining shoulder ROM. Baseline:  No knowledge 09/15/2022 Achieved at eval  3 Pt will be able to verbalize understanding of the importance of attending the post op After Breast CA Class for further lymphedema risk reduction education and therapeutic exercise.  Baseline:  No knowledge 09/15/2022 Achieved  at eval  4 Pt will demo she has regained full shoulder ROM and function post operatively compared to baselines.  Baseline: See objective measurements taken today. 11/10/2022     PLAN:  PT FREQUENCY/DURATION: EVAL and 1 follow up appointment.   PLAN FOR NEXT SESSION: will reassess 3-4 weeks post op to determine needs.   Patient will follow up at outpatient cancer rehab 3-4 weeks following surgery.  If the patient requires physical therapy at that time, a specific plan will be dictated and sent to the referring physician for approval. The patient was educated today on appropriate basic range of motion exercises to begin post operatively and the importance of attending the After Breast Cancer class following surgery.  Patient was educated today on lymphedema risk reduction practices as it pertains to recommendations that will benefit the patient immediately following surgery.  She verbalized good understanding.    Physical Therapy Information for After Breast Cancer Surgery/Treatment:  Lymphedema is a swelling condition that you may be at risk for in your arm if you have lymph nodes removed from the armpit area.  After a sentinel node biopsy, the risk is approximately 5-9% and is higher after an axillary node dissection.  There is treatment available for this condition and it is not life-threatening.  Contact your physician or physical therapist with concerns. You may begin the 4 shoulder/posture exercises (see additional  sheet) when permitted by your physician (typically a week after surgery).  If you have drains, you may need to wait until those are removed before beginning range of motion exercises.  A general recommendation is to not lift your arms above shoulder height until drains are removed.  These exercises should be done to your tolerance and gently.  This is not a "no pain/no gain" type of recovery so listen to your body and stretch into the range of motion that you can tolerate, stopping if you have pain.  If you are having immediate reconstruction, ask your plastic surgeon about doing exercises as he or she may want you to wait. We encourage you to attend the free one time ABC (After Breast Cancer) class offered by [facility].  You will learn information related to lymphedema risk, prevention and treatment and additional exercises to regain mobility following surgery.  You can call [phone] for more information.  This is offered the 1st and 3rd Monday of each month.  You only attend the class one time. While undergoing any medical procedure or treatment, try to avoid blood pressure being taken or needle sticks from occurring on the arm on the side of cancer.   This recommendation begins after surgery and continues for the rest of your life.  This may help reduce your risk of getting lymphedema (swelling in your arm). An excellent resource for those seeking information on lymphedema is the National Lymphedema Network's web site. It can be accessed at [place].org If you notice swelling in your hand, arm or breast at any time following surgery (even if it is many years from now), please contact your doctor or physical therapist to discuss this.  Lymphedema can be treated at any time but it is easier for you if it is treated early on.  If you feel like your shoulder motion is not returning to normal in a reasonable amount of time, please contact your surgeon or physical  therapist.  [facility] [phone]. [address], [address], [place] [address]  ABC CLASS After  Breast Cancer Class  After Breast Cancer Class is a specially designed exercise class to assist you in a safe recover after having breast cancer surgery.  In this class you will learn how to get back to full function whether your drains were just removed or if you had surgery a month ago.  This one-time class is held the 1st and 3rd Monday of every month from 11:00 a.m. until 12:00 noon virtually.  This class is FREE and space is limited. For more information or to register for the next available class, call [phone].  Class Goals  Understand specific stretches to improve the flexibility of you chest and shoulder. Learn ways to safely strengthen your upper body and improve your posture. Understand the warning signs of infection and why you may be at risk for an arm infection. Learn about Lymphedema and prevention.  ** You do not attend this class until after surgery.  Drains must be removed to participate  Patient was instructed today in a home exercise program today for post op shoulder range of motion. These included active assist shoulder flexion in sitting, scapular retraction, wall walking with shoulder abduction, and hands behind head external rotation.  She was encouraged to do these twice a day, holding 3 seconds and repeating 5 times when permitted by her physician.   Annia Friendly, [place] 09/15/22 11:27 AM

## 2022-09-15 NOTE — Progress Notes (Signed)
[facility]  Initial Assessment   Judy Morrison is a 72 y.o. year old female accompanied by daughters, Marliss Czar and Larene Beach. Clinical Social Work was referred by  [facility]  for assessment of psychosocial needs.   SDOH (Social Determinants of Health) assessments performed: Yes SDOH Interventions    Flowsheet Row Clinical Support from 09/15/2022 in [facility]  SDOH Interventions   Food Insecurity Interventions Intervention Not Indicated  Housing Interventions Intervention Not Indicated  Transportation Interventions Intervention Not Indicated  Utilities Interventions Intervention Not Indicated       SDOH Screenings   Food Insecurity: No Food Insecurity (09/15/2022)  Housing: Low Risk  (09/15/2022)  Transportation Needs: No Transportation Needs (09/15/2022)  Utilities: Not At Risk (09/15/2022)  Tobacco Use: Medium Risk (09/15/2022)     Distress Screen completed: Yes    09/15/2022   11:54 AM  ONCBCN DISTRESS SCREENING  Screening Type Initial Screening  Distress experienced in past week (1-10) 8  Family Problem type Children  Emotional problem type Depression;Nervousness/Anxiety  Spiritual/Religous concerns type Facing my mortality  Information Concerns Type Lack of info about diagnosis;Lack of info about treatment;Lack of info about complementary therapy choices;Lack of info about maintaining fitness  Physical Problem type Sleep/insomnia      Family/Social Information:  Housing Arrangement: patient lives alone Family members/support persons in your life? Family ([place] in [place], [place] in [place]) and Friends Transportation concerns: no  Employment: Retired .  Income source: Paediatric nurse concerns: No Type of concern: None Food access concerns: no Religious or spiritual practice: Not known Services Currently in place:  professional support for anxiety and depression  Coping/ Adjustment to  diagnosis: Patient understands treatment plan and what happens next? yes, feels better after meeting with team and hearing her plan Patient reported stressors: Depression, Anxiety/ nervousness, and Facing my mortality Current coping skills/ strengths: Ability for insight , Capable of independent living , Armed forces logistics/support/administrative officer , Motivation for treatment/growth , and Supportive family/friends     SUMMARY: Current SDOH Barriers:  No major barriers noted today  Clinical Social Work Clinical Goal(s):  No clinical social work goals at this time  Interventions: Discussed common feeling and emotions when being diagnosed with cancer, and the importance of support during treatment Informed patient of the support team roles and support services at [facility] Provided [place] contact information and encouraged patient to call with any questions or concerns   Follow Up Plan: Patient will contact CSW with any support or resource needs Patient verbalizes understanding of plan: Yes    Ruari Mudgett E Annalaura Sauseda, LCSW

## 2022-09-16 ENCOUNTER — Encounter: Payer: Self-pay | Admitting: *Deleted

## 2022-09-16 ENCOUNTER — Encounter: Payer: Self-pay | Admitting: Genetic Counselor

## 2022-09-16 NOTE — Progress Notes (Signed)
REFERRING PROVIDER: Benay Pike, MD  PRIMARY PROVIDER:  Colin Mulders, PA  PRIMARY REASON FOR VISIT:  1. Malignant neoplasm of upper-outer quadrant of right breast in female, estrogen receptor positive ([place])    HISTORY OF PRESENT ILLNESS:   Judy Morrison, a 72 y.o. female, was seen for a [facility] cancer genetics consultation at the request of Dr. Chryl Heck due to a personal and family history of cancer.  Judy Morrison presents to clinic today to discuss the possibility of a hereditary predisposition to cancer, to discuss genetic testing, and to further clarify her future cancer risks, as well as potential cancer risks for family members.   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   CANCER HISTORY:  Oncology History  Malignant neoplasm of upper-outer quadrant of right breast in female, estrogen receptor positive ([place])  08/02/2022 Mammogram   In the right breast, possible distortion warrants further evaluation. In the left breast, no findings suspicious for malignancy. Diagnostic mammogram showed persistent subtle distortion central slightly LATERAL aspect of the RIGHT breast warranting tissue diagnosis.     09/01/2022 Pathology Results   Final pathology showed grade 2 invasive mammary carcinoma, prognostic showed ER 90% positive strong staining PR 90% positive strong staining, Ki-67 of 5% and HER2 3+ by [facility]   09/10/2022 Initial Diagnosis   Malignant neoplasm of upper-outer quadrant of right breast in female, estrogen receptor positive ([place])      RISK FACTORS:  Menarche was at age 29.  First live birth at age 10.  OCP use for approximately 1 year.  Ovaries intact: yes.  Uterus intact: yes.  Menopausal status: postmenopausal.  HRT use: 1 year. Colonoscopy: yes; normal. Mammogram within the last year: yes. Any excessive radiation exposure in the past: no  Past Medical History:   Diagnosis Date   Anemia    Anxiety    Arthritis    Depressed    Dermatitis    rare form, on prednisone   GERD (gastroesophageal reflux disease)    controlled with Omeprazole   Glaucoma    Torn rotator cuff    RIGHT    Urinary tract infection    dx 05-26-17 took 1 week cipro BID completed 06-02-17.    Past Surgical History:  Procedure Laterality Date   ADENOIDECTOMY     APPENDECTOMY     arthroscopic right knee      BREAST BIOPSY Right 09/01/2022   MM RT BREAST BX W LOC DEV 1ST LESION IMAGE BX SPEC STEREO GUIDE 09/01/2022 [facility]   CHOLECYSTECTOMY     COLONOSCOPY  06/06/2017   [place]   Pelvic sling     x2   POLYPECTOMY     ROTATOR CUFF REPAIR Right    TONSILLECTOMY     TOTAL KNEE ARTHROPLASTY Right 08/12/2017   Procedure: RIGHT TOTAL KNEE ARTHROPLASTY;  Surgeon: Sydnee Cabal, MD;  Location: [facility];  Service: Orthopedics;  Laterality: Right;   TOTAL KNEE ARTHROPLASTY Left 01/06/2018   Procedure: LEFT TOTAL KNEE ARTHROPLASTY;  Surgeon: Sydnee Cabal, MD;  Location: [facility];  Service: Orthopedics;  Laterality: Left;   UPPER GASTROINTESTINAL ENDOSCOPY     WISDOM TOOTH EXTRACTION      Social History   Socioeconomic History   Marital status: Single    Spouse name: Not on file   Number of children: Not on file   Years of education: Not on file   Highest education level:  Not on file  Occupational History   Not on file  Tobacco Use   Smoking status: Former    Types: Cigarettes    Quit date: 1986    Years since quitting: 38.0   Smokeless tobacco: Never  Vaping Use   Vaping Use: Never used  Substance and Sexual Activity   Alcohol use: Yes    Alcohol/week: 2.0 standard drinks of alcohol    Types: 2 Cans of beer per week   Drug use: No   Sexual activity: Not on file  Other Topics Concern   Not on file  Social History Narrative   Not on file   Social Determinants of Health   Financial Resource Strain: Not on file  Food Insecurity: No Food  Insecurity (09/15/2022)   Hunger Vital Sign    Worried About Running Out of Food in the Last Year: Never true    Ran Out of Food in the Last Year: Never true  Transportation Needs: No Transportation Needs (09/15/2022)   PRAPARE - Hydrologist (Medical): No    Lack of Transportation (Non-Medical): No  Physical Activity: Not on file  Stress: Not on file  Social Connections: Not on file     FAMILY HISTORY:  We obtained a detailed, 4-generation family history.  Significant diagnoses are listed below: Family History  Problem Relation Age of Onset   Bowel Disease Mother        ishemic bowel   Lung cancer Mother        smoked   Cervical cancer Paternal Aunt    Cancer Maternal Grandfather        unknown type   Lung cancer Paternal Grandfather        smoked   Colon cancer Neg Hx    Breast cancer Neg Hx    Esophageal cancer Neg Hx    Pancreatic cancer Neg Hx    Prostate cancer Neg Hx    Rectal cancer Neg Hx    Stomach cancer Neg Hx    Allergic rhinitis Neg Hx    Asthma Neg Hx    Eczema Neg Hx    Urticaria Neg Hx    Colon polyps Neg Hx       Judy Morrison's mother was diagnosed with lung cancer at an unknown age, she smoked and died at age 75. Her maternal grandfather died due to an unknown type of cancer. Judy Morrison's paternal aunt was diagnosed with cervical cancer at an unknown age, she is deceased. Her paternal grandfather was diagnosed with lung cancer, he smoked and is deceased. Judy Morrison is unaware of previous family history of genetic testing for hereditary cancer risks. There is no reported Ashkenazi Jewish ancestry.   GENETIC COUNSELING ASSESSMENT: Judy Morrison is a 72 y.o. female with a personal and family history of cancer which is somewhat suggestive of a hereditary predisposition to cancer. We, therefore, discussed and recommended the following at today's visit.   DISCUSSION: We discussed that 5 - 10% of cancer is hereditary, with most  cases of breast cancer associated with BRCA1/2.  There are other genes that can be associated with hereditary breast cancer syndromes.  We discussed that testing is beneficial for several reasons including knowing how to follow individuals after completing their treatment, identifying whether potential treatment options would be beneficial, and understanding if other family members could be at risk for cancer and allowing them to undergo genetic testing.   We reviewed the characteristics, features and inheritance  patterns of hereditary cancer syndromes. We also discussed genetic testing, including the appropriate family members to test, the process of testing, insurance coverage and turn-around-time for results. We discussed the implications of a negative, positive, carrier and/or variant of uncertain significant result. We recommended Judy Morrison pursue genetic testing for a panel that includes genes associated with breast cancer.   We discussed with Judy Morrison that the personal and family history does not meet NCCN criteria for genetic testing and, therefore, is not highly consistent with a familial hereditary cancer syndrome.  We feel she is at low risk to harbor a gene mutation associated with such a condition. Judy Morrison would still like to proceed with the genetic testing for the benefit of herself and her family members.   Judy Morrison  was offered a common hereditary cancer panel (47 genes) and an expanded pan-cancer panel (77 genes). Judy Morrison was informed of the benefits and limitations of each panel, including that expanded pan-cancer panels contain genes that do not have clear management guidelines at this point in time.  We also discussed that as the number of genes included on a panel increases, the chances of variants of uncertain significance increases. After considering the benefits and limitations of each gene panel, Judy Morrison elected to have [facility] CancerNext-Expanded Panel.  The  CancerNext-Expanded gene panel offered by [facility] and includes sequencing, rearrangement, and RNA analysis for the following 77 genes: AIP, ALK, APC, ATM, AXIN2, BAP1, BARD1, BLM, BMPR1A, BRCA1, BRCA2, BRIP1, CDC73, CDH1, CDK4, CDKN1B, CDKN2A, CHEK2, CTNNA1, DICER1, FANCC, FH, FLCN, GALNT12, KIF1B, LZTR1, MAX, MEN1, MET, MLH1, MSH2, MSH3, MSH6, MUTYH, NBN, NF1, NF2, NTHL1, PALB2, PHOX2B, PMS2, POT1, PRKAR1A, PTCH1, PTEN, RAD51C, RAD51D, RB1, RECQL, RET, SDHA, SDHAF2, SDHB, SDHC, SDHD, SMAD4, SMARCA4, SMARCB1, SMARCE1, STK11, SUFU, TMEM127, TP53, TSC1, TSC2, VHL and XRCC2 (sequencing and deletion/duplication); EGFR, EGLN1, HOXB13, KIT, MITF, PDGFRA, POLD1, and POLE (sequencing only); EPCAM and GREM1 (deletion/duplication only).   PLAN: After considering the risks, benefits, and limitations, Judy Morrison provided informed consent to pursue genetic testing and the blood sample was sent to [facility] for analysis of the CancerNext-Expanded Panel. Results should be available within approximately 2-3 weeks' time, at which point they will be disclosed by telephone to Judy Morrison, as will any additional recommendations warranted by these results. Judy Morrison will receive a summary of her genetic counseling visit and a copy of her results once available. This information will also be available in Epic.   Judy Morrison questions were answered to her satisfaction today. Our contact information was provided should additional questions or concerns arise. Thank you for the referral and allowing [place] to share in the care of your patient.   Lucille Passy, MS, [facility] Genetic Counselor [email or web address] (P) [phone]  The patient was seen for a total of 20 minutes in face-to-face genetic counseling.  The patient brought her daughters.  Drs. Lindi Adie and/or Burr Medico were available to discuss this case as needed.   _______________________________________________________________________ For Office  Staff:  Number of people involved in session: 3 Was an Intern/ student involved with case: no

## 2022-09-20 ENCOUNTER — Ambulatory Visit
Admission: RE | Admit: 2022-09-20 | Discharge: 2022-09-20 | Disposition: A | Discharge status: Home / Self Care | Payer: Medicare Other | Source: Ambulatory Visit | Attending: Registered Nurse | Admitting: Registered Nurse

## 2022-09-20 DIAGNOSIS — C50919 Malignant neoplasm of unspecified site of unspecified female breast: Secondary | ICD-10-CM

## 2022-09-20 DIAGNOSIS — R599 Enlarged lymph nodes, unspecified: Secondary | ICD-10-CM

## 2022-09-21 ENCOUNTER — Encounter: Payer: Self-pay | Admitting: *Deleted

## 2022-09-21 ENCOUNTER — Other Ambulatory Visit: Payer: Self-pay | Admitting: General Surgery

## 2022-09-21 ENCOUNTER — Telehealth: Payer: Self-pay | Admitting: *Deleted

## 2022-09-21 DIAGNOSIS — Z170 Estrogen receptor positive status [ER+]: Secondary | ICD-10-CM

## 2022-09-21 NOTE — Telephone Encounter (Signed)
Spoke with patient to follow up from [facility] and assess navigation needs.  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 She is ready for have surgery.  Message sent to team. No further questions at this time.  Encouraged her to call should anything arise. Patient verbalized understanding.

## 2022-09-22 ENCOUNTER — Other Ambulatory Visit: Payer: Self-pay

## 2022-09-22 ENCOUNTER — Other Ambulatory Visit: Payer: Self-pay | Admitting: General Surgery

## 2022-09-22 ENCOUNTER — Other Ambulatory Visit: Payer: Medicare Other

## 2022-09-22 DIAGNOSIS — C50411 Malignant neoplasm of upper-outer quadrant of right female breast: Secondary | ICD-10-CM

## 2022-09-22 DIAGNOSIS — Z170 Estrogen receptor positive status [ER+]: Secondary | ICD-10-CM

## 2022-09-23 ENCOUNTER — Encounter: Payer: Self-pay | Admitting: *Deleted

## 2022-09-28 ENCOUNTER — Other Ambulatory Visit: Payer: Self-pay

## 2022-09-28 ENCOUNTER — Inpatient Hospital Stay: Payer: Medicare Other

## 2022-09-28 DIAGNOSIS — Z170 Estrogen receptor positive status [ER+]: Secondary | ICD-10-CM

## 2022-09-28 LAB — RESEARCH LABS

## 2022-09-28 NOTE — Research (Signed)
Trial Name:  [employer] 2021-05 - Specimen Collection Study to Evaluate Biomarkers in Subjects with Cancer   Patient Judy Morrison was identified by Dr Chryl Heck as a potential candidate for the above listed study.  This Clinical Research Nurse met with GENINE BECKETT, [id number] on 09/28/22 in a manner and location that ensures patient privacy to discuss participation in the above listed research study.  Patient is Accompanied by her daughter .  Patient was previously provided with informed consent documents.  Patient confirmed they have read the informed consent documents.  As outlined in the informed consent form, this Nurse and Ebony Hail discussed the purpose of the research study, the investigational nature of the study, study procedures and requirements for study participation, potential risks and benefits of study participation, as well as alternatives to participation.  This study is not blinded or double-blinded. The patient understands participation is voluntary and they may withdraw from study participation at any time.  This study does not involve randomization.  This study does not involve an investigational drug or device. This study does not involve a placebo. Patient understands enrollment is pending full eligibility review.   Confidentiality and how the patient's information will be used as part of study participation were discussed.  Patient was informed there is reimbursement provided for their time and effort spent on trial participation.  The patient is encouraged to discuss research study participation with their insurance provider to determine what costs they may incur as part of study participation, including research related injury.    All questions were answered to patient's satisfaction.  The informed consent with embedded HIPAA language was reviewed page by page.  The patient's mental and emotional status is appropriate to provide informed consent, and the  patient verbalizes an understanding of study participation.  Patient has agreed to participate in the above listed research study and has voluntarily signed the informed consent version Date 12 Oct 2021 with embedded HIPAA language, version Date 12 Oct 2021  on 09/28/22 at 1110AM.  A copy of the signed informed consent form with embedded HIPAA language will be mailed to the patient for their reference.  No study specific procedures were obtained prior to the signing of the informed consent document.  Approximately 15 minutes were spent with the patient reviewing the informed consent documents.  Patient was not requested to complete a Release of Information form.  Marjie Skiff Banner Huckaba, RN, BSN, [facility] She  Her  Hers Clinical Research Nurse [facility] Direct Dial [phone]  Pager [phone] 09/28/2022 11:30 AM

## 2022-09-28 NOTE — Research (Signed)
Medical History:  High Blood Pressure  No Coronary Artery Disease No Lupus    No Rheumatoid Arthritis  No Diabetes   No      Lynch Syndrome  No  Is the patient currently taking a magnesium supplement?   No  Does the patient have a personal history of cancer (greater than 5 years ago)?  No  Does the patient have a family history of cancer in 1st or 2nd degree relatives? Yes If yes, Relationship(s) and Cancer type(s)? Mother: lung  Does the patient have history of alcohol consumption? No    Does the patient have history of cigarette, cigar, pipe, or chewing tobacco use?  No     This Nurse has reviewed this patient's inclusion and exclusion criteria and confirmed Judy Morrison is eligible for study participation.  Patient will continue with enrollment.  Eligibility confirmed by treating investigator, who also agrees that patient should proceed with enrollment.  Marjie Skiff Maddelyn Rocca, RN, BSN, [facility] She  Her  Hers Clinical Research Nurse [facility] Direct Dial [phone]  Pager [phone] 09/28/2022 11:28 AM

## 2022-09-28 NOTE — Research (Signed)
[employer] 2021-05 - Specimen Collection Study to Evaluate Biomarkers in Subjects with Cancer     This Coordinator has reviewed this patient's inclusion and exclusion criteria as a second review and confirms Judy Morrison is eligible for study participation.  Patient may continue with enrollment.   Johny Drilling, [facility] 09/28/2022 12:48 PM

## 2022-09-29 ENCOUNTER — Encounter (HOSPITAL_BASED_OUTPATIENT_CLINIC_OR_DEPARTMENT_OTHER): Payer: Self-pay | Admitting: General Surgery

## 2022-09-30 MED ORDER — ENSURE PRE-SURGERY PO LIQD: 296.0000 mL | Freq: Once | ORAL | Status: DC

## 2022-09-30 NOTE — Progress Notes (Signed)

## 2022-10-01 ENCOUNTER — Ambulatory Visit
Admission: RE | Admit: 2022-10-01 | Discharge: 2022-10-01 | Disposition: A | Discharge status: Home / Self Care | Payer: Medicare Other | Source: Ambulatory Visit | Attending: General Surgery | Admitting: General Surgery

## 2022-10-01 DIAGNOSIS — C50411 Malignant neoplasm of upper-outer quadrant of right female breast: Secondary | ICD-10-CM

## 2022-10-01 HISTORY — PX: BREAST BIOPSY: SHX20

## 2022-10-04 ENCOUNTER — Encounter: Payer: Self-pay | Admitting: Genetic Counselor

## 2022-10-04 ENCOUNTER — Ambulatory Visit: Payer: Self-pay | Admitting: Genetic Counselor

## 2022-10-04 ENCOUNTER — Telehealth: Payer: Self-pay | Admitting: Genetic Counselor

## 2022-10-04 DIAGNOSIS — Z1501 Genetic susceptibility to malignant neoplasm of breast: Secondary | ICD-10-CM | POA: Insufficient documentation

## 2022-10-04 DIAGNOSIS — Z1379 Encounter for other screening for genetic and chromosomal anomalies: Secondary | ICD-10-CM

## 2022-10-04 DIAGNOSIS — Z1509 Genetic susceptibility to other malignant neoplasm: Secondary | ICD-10-CM

## 2022-10-04 HISTORY — DX: Encounter for other screening for genetic and chromosomal anomalies: Z13.79

## 2022-10-04 HISTORY — DX: Genetic susceptibility to other malignant neoplasm: Z15.09

## 2022-10-04 NOTE — Telephone Encounter (Addendum)
I contacted Ms. Sebek to discuss her genetic testing results. [facility] CancerNext-Expanded Panel+RNA was Positive. 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 Detailed clinic note to follow.  The test report has been scanned into EPIC and is located under the Molecular Pathology section of the Results Review tab.  A portion of the result report is included below for reference.   Lucille Passy, MS, [facility] Genetic Counselor [email or web address] (P) [phone]

## 2022-10-04 NOTE — Progress Notes (Signed)
HPI:   Judy Morrison was previously seen in the [place] clinic due to a personal and family history of cancer and concerns regarding a hereditary predisposition to cancer. Please refer to our prior cancer genetics clinic note for more information regarding our discussion, assessment and recommendations, at the time. Judy Morrison recent genetic test results were disclosed to her, as were recommendations warranted by these results. These results and recommendations are discussed in more detail below.  CANCER HISTORY:  Oncology History  Malignant neoplasm of upper-outer quadrant of right breast in female, estrogen receptor positive ([place])  08/02/2022 Mammogram   In the right breast, possible distortion warrants further evaluation. In the left breast, no findings suspicious for malignancy. Diagnostic mammogram showed persistent subtle distortion central slightly LATERAL aspect of the RIGHT breast warranting tissue diagnosis.     09/01/2022 Pathology Results   Final pathology showed grade 2 invasive mammary carcinoma, prognostic showed ER 90% positive strong staining PR 90% positive strong staining, Ki-67 of 5% and HER2 3+ by [facility]   09/10/2022 Initial Diagnosis   Malignant neoplasm of upper-outer quadrant of right breast in female, estrogen receptor positive ([place])     FAMILY HISTORY:  We obtained a detailed, 4-generation family history.  Significant diagnoses are listed below:      Family History  Problem Relation Age of Onset   Bowel Disease Mother          ishemic bowel   Lung cancer Mother          smoked   Cervical cancer Paternal Aunt     Cancer Maternal Grandfather          unknown type   Lung cancer Paternal Grandfather          smoked   Colon cancer Neg Hx     Breast cancer Neg Hx     Esophageal cancer Neg Hx     Pancreatic cancer Neg Hx     Prostate cancer Neg Hx     Rectal cancer Neg Hx     Stomach cancer Neg Hx     Allergic rhinitis Neg Hx     Asthma  Neg Hx     Eczema Neg Hx     Urticaria Neg Hx     Colon polyps Neg Hx           Judy Morrison's mother was diagnosed with lung cancer at an unknown age, she smoked and died at age 80. Her maternal grandfather died due to an unknown type of cancer. Judy Morrison's paternal aunt was diagnosed with cervical cancer at an unknown age, she is deceased. Her paternal grandfather was diagnosed with lung cancer, he smoked and is deceased. Judy Morrison is unaware of previous family history of genetic testing for hereditary cancer risks. There is no reported Ashkenazi Jewish ancestry.   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  The test report has been scanned into EPIC and is located under the Molecular Pathology section of the Results Review tab.  A portion of the result report is included below for reference. Genetic testing reported out on 10/04/2022.        Genetic testing identified a variant of uncertain significance (VUS) in the BLM gene called p.S33. At this time, it is unknown if this variant is associated with an increased risk for cancer or if it is benign, but most  uncertain variants are reclassified to benign. It should not be used to make medical management decisions. With time, we suspect the laboratory will determine the significance of this variant, if any. If the laboratory reclassifies this variant, we will attempt to contact Judy Morrison to discuss it further.   Cancer Risks for ATM: Women have a 20-40% lifetime risk of breast cancer. 2-3% risk for epithelial ovarian cancer 5-10% risk for pancreatic cancer  There is emerging evidence suggesting an increased risk for prostate cancer.  Research is continuing to help learn more about the cancers associated with ATM pathogenic variants and what the exact risks are to develop these  cancers.   Management Recommendations:  Breast Screening/Risk Reduction: Breast cancer screening includes: Breast awareness beginning at age 70 Monthly self-breast examination beginning at age 15 Clinical breast examination every 6-12 months beginning at age 81 or at the age of the earliest diagnosed breast cancer in the family, if onset was before age 75 Annual mammogram with consideration of tomosynthesis starting at age 49 or 37 years prior to the youngest age of diagnosis, whichever comes first Consider breast MRI with and without contrast starting at age 72-35 Consider additional risk reducing strategies such as Tamoxifen Evidence is insufficient for a prophylactic risk-reducing mastectomy, manage based on family history  For patients who are treated for breast cancer and have not had bilateral mastectomy, screening should continue as described  Ovarian Cancer Screening/Risk Reduction: Evidence insufficient for risk-reducing salpingo oophorectomy; manage based on family history If there is a family history of ovarian cancer, have a discussion with your physician about the benefits and limitations of screening and risk reducing strategies  Pancreatic Cancer Screening/Risk Reduction: Avoid smoking, heavy alcohol use, and obesity. Pancreatic cancer screening may be considered in those with a family history of pancreatic cancer (first- or second-degree relative). Screening includes annual endoscopic ultrasound (preferred) and/or MRI of the pancreas starting at age 40 or 69 years younger than the earliest age diagnosis in the family.  Annual concurrent CA19-9 testing may also be considered.  Prostate Cancer Screening: Consider beginning annual PSA blood test and digital rectal exams at age 28.  Additional considerations: There is insufficient evidence to recommend against radiation therapy.  Individuals with a single pathogenic ATM variant are also carriers of ataxia telangiectasia.  Ataxia telangiectasia is associated with childhood cancer risks as well as other medical problems (such as difficulty with movement, balance and coordination problems, neuropathy, and weakened immunity). For there to be a risk of ataxia telangiectasia in offspring, both the patient and their partner would each have to carry a pathogenic variant in ATM; in this case, the risk to have an affected child is 25%.  This information is based on current understanding of the gene and may change in the future.  Implications for Family Members: Hereditary predisposition to cancer due to pathogenic variants in the ATM gene has autosomal dominant inheritance. This means that an individual with a pathogenic variant has a 50% chance of passing the condition on to his/her offspring. Identification of a pathogenic variant allows for the recognition of at-risk relatives who can pursue testing for the familial variant.   Family members are encouraged to consider genetic testing for this familial pathogenic variant. As there are generally no childhood cancer risks associated with pathogenic variants in the ATM gene, individuals in the family are not recommended to have testing until they reach at least 74 years of age. They may contact our office at [phone] for more information  or to schedule an appointment. Complimentary testing for the familial variant is available for 90 days. Family members who live outside of the area are encouraged to find a genetic counselor in their area by visiting: PanelJobs.[email or web address].  Resources: FORCE (Facing Our Risk of Cancer Empowered) is a resource for those with a hereditary predisposition to develop cancer.  [employer] provides information about risk reduction, advocacy, legislation, and clinical trials.  Additionally, [employer] provides a platform for collaboration and support; which includes: peer navigation, message boards, local support groups, a toll-free helpline,  research registry and recruitment, advocate training, published medical research, webinars, brochures, mastectomy photos, and more.  For more information, visit www.facingourrisk.org  Our contact number was provided. Ms. Bardwell questions were answered to her satisfaction, and she knows she is welcome to call us at anytime with additional questions or concerns.   Judy Passy, MS, [facility] Genetic Counselor [email or web address] (P) [phone]

## 2022-10-05 ENCOUNTER — Encounter (HOSPITAL_BASED_OUTPATIENT_CLINIC_OR_DEPARTMENT_OTHER)
Admission: RE | Disposition: A | Discharge status: Home / Self Care | Payer: Self-pay | Source: Home / Self Care | Attending: General Surgery

## 2022-10-05 ENCOUNTER — Ambulatory Visit (HOSPITAL_BASED_OUTPATIENT_CLINIC_OR_DEPARTMENT_OTHER): Payer: Medicare Other | Admitting: Anesthesiology

## 2022-10-05 ENCOUNTER — Other Ambulatory Visit: Payer: Self-pay

## 2022-10-05 ENCOUNTER — Ambulatory Visit
Admission: RE | Admit: 2022-10-05 | Discharge: 2022-10-05 | Disposition: A | Discharge status: Home / Self Care | Payer: Medicare Other | Source: Ambulatory Visit | Attending: General Surgery | Admitting: General Surgery

## 2022-10-05 ENCOUNTER — Encounter (HOSPITAL_BASED_OUTPATIENT_CLINIC_OR_DEPARTMENT_OTHER): Payer: Self-pay | Admitting: General Surgery

## 2022-10-05 ENCOUNTER — Ambulatory Visit (HOSPITAL_BASED_OUTPATIENT_CLINIC_OR_DEPARTMENT_OTHER)
Admission: RE | Admit: 2022-10-05 | Discharge: 2022-10-05 | Disposition: A | Discharge status: Home / Self Care | Payer: Medicare Other | Attending: General Surgery | Admitting: General Surgery

## 2022-10-05 ENCOUNTER — Ambulatory Visit (HOSPITAL_COMMUNITY): Payer: Medicare Other

## 2022-10-05 DIAGNOSIS — E039 Hypothyroidism, unspecified: Secondary | ICD-10-CM | POA: Insufficient documentation

## 2022-10-05 DIAGNOSIS — F419 Anxiety disorder, unspecified: Secondary | ICD-10-CM | POA: Diagnosis not present

## 2022-10-05 DIAGNOSIS — C50411 Malignant neoplasm of upper-outer quadrant of right female breast: Secondary | ICD-10-CM

## 2022-10-05 DIAGNOSIS — M1990 Unspecified osteoarthritis, unspecified site: Secondary | ICD-10-CM | POA: Insufficient documentation

## 2022-10-05 DIAGNOSIS — Z87891 Personal history of nicotine dependence: Secondary | ICD-10-CM | POA: Insufficient documentation

## 2022-10-05 DIAGNOSIS — D0511 Intraductal carcinoma in situ of right breast: Secondary | ICD-10-CM | POA: Insufficient documentation

## 2022-10-05 DIAGNOSIS — Z170 Estrogen receptor positive status [ER+]: Secondary | ICD-10-CM | POA: Insufficient documentation

## 2022-10-05 DIAGNOSIS — Z79899 Other long term (current) drug therapy: Secondary | ICD-10-CM | POA: Diagnosis not present

## 2022-10-05 DIAGNOSIS — Z96653 Presence of artificial knee joint, bilateral: Secondary | ICD-10-CM | POA: Diagnosis not present

## 2022-10-05 DIAGNOSIS — Z79890 Hormone replacement therapy (postmenopausal): Secondary | ICD-10-CM | POA: Insufficient documentation

## 2022-10-05 DIAGNOSIS — Z1501 Genetic susceptibility to malignant neoplasm of breast: Secondary | ICD-10-CM | POA: Diagnosis not present

## 2022-10-05 DIAGNOSIS — Z01818 Encounter for other preprocedural examination: Secondary | ICD-10-CM

## 2022-10-05 DIAGNOSIS — F32A Depression, unspecified: Secondary | ICD-10-CM | POA: Diagnosis not present

## 2022-10-05 DIAGNOSIS — Z808 Family history of malignant neoplasm of other organs or systems: Secondary | ICD-10-CM | POA: Diagnosis not present

## 2022-10-05 DIAGNOSIS — K219 Gastro-esophageal reflux disease without esophagitis: Secondary | ICD-10-CM | POA: Diagnosis not present

## 2022-10-05 DIAGNOSIS — C50911 Malignant neoplasm of unspecified site of right female breast: Secondary | ICD-10-CM

## 2022-10-05 HISTORY — PX: BREAST LUMPECTOMY WITH RADIOACTIVE SEED AND SENTINEL LYMPH NODE BIOPSY: SHX6550

## 2022-10-05 HISTORY — PX: PORTACATH PLACEMENT: SHX2246

## 2022-10-05 SURGERY — BREAST LUMPECTOMY WITH RADIOACTIVE SEED AND SENTINEL LYMPH NODE BIOPSY
Anesthesia: Regional | Site: Chest | Laterality: Right

## 2022-10-05 MED ORDER — FENTANYL CITRATE (PF) 100 MCG/2ML IJ SOLN
INTRAMUSCULAR | Status: AC
  Filled 2022-10-05: qty 2

## 2022-10-05 MED ORDER — MAGTRACE LYMPHATIC TRACER
INTRAMUSCULAR | Status: DC | PRN
  Administered 2022-10-05: 2 mL via INTRAMUSCULAR

## 2022-10-05 MED ORDER — PROPOFOL 10 MG/ML IV BOLUS
INTRAVENOUS | Status: AC
  Filled 2022-10-05: qty 20

## 2022-10-05 MED ORDER — PROPOFOL 500 MG/50ML IV EMUL
INTRAVENOUS | Status: DC | PRN
  Administered 2022-10-05: 200 ug/kg/min via INTRAVENOUS

## 2022-10-05 MED ORDER — CLINDAMYCIN PHOSPHATE 900 MG/50ML IV SOLN
INTRAVENOUS | Status: AC
  Filled 2022-10-05: qty 50

## 2022-10-05 MED ORDER — HEPARIN SOD (PORK) LOCK FLUSH 100 UNIT/ML IV SOLN
INTRAVENOUS | Status: DC | PRN
  Administered 2022-10-05: 500 [IU] via INTRAVENOUS

## 2022-10-05 MED ORDER — ACETAMINOPHEN 500 MG PO TABS
ORAL_TABLET | ORAL | Status: AC
  Filled 2022-10-05: qty 2

## 2022-10-05 MED ORDER — BUPIVACAINE HCL (PF) 0.25 % IJ SOLN
INTRAMUSCULAR | Status: DC | PRN
  Administered 2022-10-05: 10 mL

## 2022-10-05 MED ORDER — PROPOFOL 10 MG/ML IV BOLUS
INTRAVENOUS | Status: DC | PRN
  Administered 2022-10-05: 150 mg via INTRAVENOUS
  Administered 2022-10-05: 50 mg via INTRAVENOUS

## 2022-10-05 MED ORDER — DEXAMETHASONE SODIUM PHOSPHATE 10 MG/ML IJ SOLN
INTRAMUSCULAR | Status: AC
  Filled 2022-10-05: qty 1

## 2022-10-05 MED ORDER — HEPARIN (PORCINE) IN NACL 2-0.9 UNITS/ML
INTRAMUSCULAR | Status: AC | PRN
  Administered 2022-10-05: 1 via INTRAVENOUS

## 2022-10-05 MED ORDER — MIDAZOLAM HCL 2 MG/2ML IJ SOLN: 1.0000 mg | Freq: Once | INTRAMUSCULAR | Status: DC

## 2022-10-05 MED ORDER — DEXAMETHASONE SODIUM PHOSPHATE 4 MG/ML IJ SOLN
INTRAMUSCULAR | Status: DC | PRN
  Administered 2022-10-05: 5 mg via INTRAVENOUS

## 2022-10-05 MED ORDER — FENTANYL CITRATE (PF) 100 MCG/2ML IJ SOLN: 25.0000 ug | INTRAMUSCULAR | Status: DC | PRN

## 2022-10-05 MED ORDER — CHLORHEXIDINE GLUCONATE CLOTH 2 % EX PADS: 6.0000 | MEDICATED_PAD | Freq: Once | CUTANEOUS | Status: DC

## 2022-10-05 MED ORDER — ROPIVACAINE HCL 5 MG/ML IJ SOLN
INTRAMUSCULAR | Status: DC | PRN
  Administered 2022-10-05: 30 mL via PERINEURAL

## 2022-10-05 MED ORDER — MIDAZOLAM HCL 2 MG/2ML IJ SOLN
INTRAMUSCULAR | Status: AC
  Filled 2022-10-05: qty 2

## 2022-10-05 MED ORDER — ACETAMINOPHEN 500 MG PO TABS
1000.0000 mg | ORAL_TABLET | ORAL | Status: AC
  Administered 2022-10-05: 1000 mg via ORAL

## 2022-10-05 MED ORDER — FENTANYL CITRATE (PF) 100 MCG/2ML IJ SOLN
50.0000 ug | Freq: Once | INTRAMUSCULAR | Status: AC
  Administered 2022-10-05 (×4): 25 ug via INTRAVENOUS

## 2022-10-05 MED ORDER — ONDANSETRON HCL 4 MG/2ML IJ SOLN
INTRAMUSCULAR | Status: AC
  Filled 2022-10-05: qty 2

## 2022-10-05 MED ORDER — CLINDAMYCIN PHOSPHATE 900 MG/50ML IV SOLN
900.0000 mg | INTRAVENOUS | Status: AC
  Administered 2022-10-05: 900 mg via INTRAVENOUS

## 2022-10-05 MED ORDER — HEPARIN (PORCINE) IN NACL 1000-0.9 UT/500ML-% IV SOLN
INTRAVENOUS | Status: AC
  Filled 2022-10-05: qty 500

## 2022-10-05 MED ORDER — DEXAMETHASONE SODIUM PHOSPHATE 10 MG/ML IJ SOLN
INTRAMUSCULAR | Status: DC | PRN
  Administered 2022-10-05: 5 mg

## 2022-10-05 MED ORDER — PROPOFOL 500 MG/50ML IV EMUL
INTRAVENOUS | Status: AC
  Filled 2022-10-05: qty 50

## 2022-10-05 MED ORDER — LACTATED RINGERS IV SOLN: INTRAVENOUS | Status: DC

## 2022-10-05 MED ORDER — BUPIVACAINE HCL (PF) 0.25 % IJ SOLN
INTRAMUSCULAR | Status: AC
  Filled 2022-10-05: qty 30

## 2022-10-05 MED ORDER — ACETAMINOPHEN 500 MG PO TABS: 1000.0000 mg | ORAL_TABLET | Freq: Once | ORAL | Status: DC

## 2022-10-05 MED ORDER — TRAMADOL HCL 50 MG PO TABS: 50.0000 mg | ORAL_TABLET | Freq: Four times a day (QID) | ORAL | 0 refills | Status: DC | PRN

## 2022-10-05 SURGICAL SUPPLY — 75 items
ADH SKN CLS APL DERMABOND .7 (GAUZE/BANDAGES/DRESSINGS) ×4
APL PRP STRL LF DISP 70% ISPRP (MISCELLANEOUS) ×2
APL SKNCLS STERI-STRIP NONHPOA (GAUZE/BANDAGES/DRESSINGS) ×2
APPLIER CLIP 9.375 MED OPEN (MISCELLANEOUS) ×2
APR CLP MED 9.3 20 MLT OPN (MISCELLANEOUS) ×2
BAG DECANTER FOR FLEXI CONT (MISCELLANEOUS) ×2 IMPLANT
BENZOIN TINCTURE PRP APPL 2/3 (GAUZE/BANDAGES/DRESSINGS) ×2 IMPLANT
BINDER BREAST LRG (GAUZE/BANDAGES/DRESSINGS) IMPLANT
BINDER BREAST MEDIUM (GAUZE/BANDAGES/DRESSINGS) IMPLANT
BINDER BREAST XLRG (GAUZE/BANDAGES/DRESSINGS) IMPLANT
BINDER BREAST XXLRG (GAUZE/BANDAGES/DRESSINGS) IMPLANT
BLADE SURG 11 STRL SS (BLADE) ×2 IMPLANT
BLADE SURG 15 STRL LF DISP TIS (BLADE) ×2 IMPLANT
BLADE SURG 15 STRL SS (BLADE) ×2
CANISTER SUC SOCK COL 7IN (MISCELLANEOUS) IMPLANT
CANISTER SUCT 1200ML W/VALVE (MISCELLANEOUS) IMPLANT
CHLORAPREP W/TINT 26 (MISCELLANEOUS) ×2 IMPLANT
CLIP APPLIE 9.375 MED OPEN (MISCELLANEOUS) IMPLANT
CLIP TI WIDE RED SMALL 6 (CLIP) ×2 IMPLANT
COVER BACK TABLE 60X90IN (DRAPES) ×2 IMPLANT
COVER MAYO STAND STRL (DRAPES) ×2 IMPLANT
COVER PROBE 5X48 (MISCELLANEOUS) ×2
COVER PROBE CYLINDRICAL 5X96 (MISCELLANEOUS) ×2 IMPLANT
DERMABOND ADVANCED .7 DNX12 (GAUZE/BANDAGES/DRESSINGS) ×2 IMPLANT
DRAPE C-ARM 42X72 X-RAY (DRAPES) ×2 IMPLANT
DRAPE LAPAROSCOPIC ABDOMINAL (DRAPES) ×2 IMPLANT
DRAPE TOP ARMCOVERS (MISCELLANEOUS) IMPLANT
DRAPE U-SHAPE 76X120 STRL (DRAPES) IMPLANT
DRAPE UTILITY XL STRL (DRAPES) ×2 IMPLANT
DRSG TEGADERM 4X4.75 (GAUZE/BANDAGES/DRESSINGS) IMPLANT
ELECT COATED BLADE 2.86 ST (ELECTRODE) ×2 IMPLANT
ELECT REM PT RETURN 9FT ADLT (ELECTROSURGICAL) ×2
ELECTRODE REM PT RTRN 9FT ADLT (ELECTROSURGICAL) ×2 IMPLANT
GAUZE 4X4 16PLY ~~LOC~~+RFID DBL (SPONGE) ×2 IMPLANT
GAUZE SPONGE 4X4 12PLY STRL LF (GAUZE/BANDAGES/DRESSINGS) ×2 IMPLANT
GLOVE BIO SURGEON STRL SZ7 (GLOVE) ×4 IMPLANT
GLOVE BIOGEL PI IND STRL 7.0 (GLOVE) IMPLANT
GLOVE BIOGEL PI IND STRL 7.5 (GLOVE) ×2 IMPLANT
GOWN STRL REUS W/ TWL LRG LVL3 (GOWN DISPOSABLE) ×4 IMPLANT
GOWN STRL REUS W/TWL LRG LVL3 (GOWN DISPOSABLE) ×6
HEMOSTAT ARISTA ABSORB 3G PWDR (HEMOSTASIS) IMPLANT
IV KIT MINILOC 20X1 SAFETY (NEEDLE) IMPLANT
KIT CVR 48X5XPRB PLUP LF (MISCELLANEOUS) IMPLANT
KIT MARKER MARGIN INK (KITS) ×2 IMPLANT
KIT PORT POWER 8FR ISP CVUE (Port) IMPLANT
NDL HYPO 25X1 1.5 SAFETY (NEEDLE) ×2 IMPLANT
NDL SAFETY ECLIP 18X1.5 (MISCELLANEOUS) IMPLANT
NEEDLE HYPO 25X1 1.5 SAFETY (NEEDLE) ×2 IMPLANT
NS IRRIG 1000ML POUR BTL (IV SOLUTION) IMPLANT
PACK BASIN DAY SURGERY FS (CUSTOM PROCEDURE TRAY) ×2 IMPLANT
PENCIL SMOKE EVACUATOR (MISCELLANEOUS) ×2 IMPLANT
RETRACTOR ONETRAX LX 90X20 (MISCELLANEOUS) IMPLANT
SLEEVE SCD COMPRESS KNEE MED (STOCKING) ×2 IMPLANT
SPIKE FLUID TRANSFER (MISCELLANEOUS) IMPLANT
SPONGE T-LAP 4X18 ~~LOC~~+RFID (SPONGE) ×2 IMPLANT
STRIP CLOSURE SKIN 1/2X4 (GAUZE/BANDAGES/DRESSINGS) ×2 IMPLANT
SUT ETHILON 2 0 FS 18 (SUTURE) IMPLANT
SUT MNCRL AB 4-0 PS2 18 (SUTURE) ×2 IMPLANT
SUT MON AB 5-0 PS2 18 (SUTURE) IMPLANT
SUT PROLENE 2 0 SH DA (SUTURE) ×2 IMPLANT
SUT SILK 2 0 SH (SUTURE) IMPLANT
SUT SILK 2 0 TIES 17X18 (SUTURE)
SUT SILK 2-0 18XBRD TIE BLK (SUTURE) IMPLANT
SUT VIC AB 2-0 SH 27 (SUTURE) ×6
SUT VIC AB 2-0 SH 27XBRD (SUTURE) ×2 IMPLANT
SUT VIC AB 3-0 SH 27 (SUTURE) ×4
SUT VIC AB 3-0 SH 27X BRD (SUTURE) ×2 IMPLANT
SUT VIC AB 5-0 PS2 18 (SUTURE) IMPLANT
SYR 5ML LUER SLIP (SYRINGE) ×2 IMPLANT
SYR CONTROL 10ML LL (SYRINGE) ×2 IMPLANT
TOWEL GREEN STERILE FF (TOWEL DISPOSABLE) ×2 IMPLANT
TRACER MAGTRACE VIAL (MISCELLANEOUS) IMPLANT
TRAY FAXITRON CT DISP (TRAY / TRAY PROCEDURE) ×2 IMPLANT
TUBE CONNECTING 20X1/4 (TUBING) IMPLANT
YANKAUER SUCT BULB TIP NO VENT (SUCTIONS) IMPLANT

## 2022-10-05 NOTE — Progress Notes (Signed)
Assisted Dr. Woodrum with right, pectoralis, ultrasound guided block. Side rails up, monitors on throughout procedure. See vital signs in flow sheet. Tolerated Procedure well. 

## 2022-10-05 NOTE — H&P (Signed)
12 yof with screening mammogram that shows c density breast tissue and a right breast distortion just lateral to the nipple anteriorly. There is no [place] correlate. She has an ax [place] that she will get later today. 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  She has pcn allergy with throat swelling. She is on prednisone for chronic itching. She has no prior breast history and no family history. Her two daughters are with her today.  Review of Systems: A complete review of systems was obtained from the patient. I have reviewed this information and discussed as appropriate with the patient. See HPI as well for other ROS.  Review of Systems  Respiratory: Positive for shortness of breath.  Genitourinary: Positive for hematuria.  Musculoskeletal: Positive for joint pain.  All other systems reviewed and are negative.   Medical History: Past Medical History:  Diagnosis Date  Anemia  Anxiety  Arthritis  GERD (gastroesophageal reflux disease)  Glaucoma (increased eye pressure)  History of cancer   Patient Active Problem List  Diagnosis  Malignant neoplasm of upper-outer quadrant of right breast in female, estrogen receptor positive   Past Surgical History:  Procedure Laterality Date  ARTHROPLASTY TOTAL KNEE Right 08/12/2017  ARTHROPLASTY TOTAL KNEE Left 01/06/2018  APPENDECTOMY  CHOLECYSTECTOMY   Allergies  Allergen Reactions  Nitrofurantoin Itching  Legs itching 30 min after taking medication  Penicillins Anaphylaxis, Itching and Swelling  Has patient had a PCN reaction causing immediate rash, facial/tongue/throat swelling, SOB or lightheadedness with hypotension: Yes Has patient had a PCN reaction causing severe rash involving mucus membranes or skin necrosis: No Has patient had a PCN reaction that required hospitalization: No Has patient had a PCN reaction occurring within the last 10 years: No If all of the  above answers are "NO", then may proceed with Cephalosporin use.  Codeine Nausea  Erythromycin Itching and Rash   Current Outpatient Medications on File Prior to Visit  Medication Sig Dispense Refill  DULoxetine 40 mg CpDR Take 1 capsule by mouth every morning  levothyroxine (SYNTHROID) 50 MCG tablet Take 1 tablet by mouth once daily  clonazePAM (KLONOPIN) 0.5 MG tablet Take 0.5 mg by mouth 2 (two) times daily  rosuvastatin (CRESTOR) 5 MG tablet Take 5 mg by mouth once daily   No current facility-administered medications on file prior to visit.   Family History  Problem Relation Age of Onset  High blood pressure (Hypertension) Mother  Skin cancer Father  Hyperlipidemia (Elevated cholesterol) Father  Coronary Artery Disease (Blocked arteries around heart) Father    Social History   Tobacco Use  Smoking Status Former  Types: Cigarettes  Quit date: 1986  Years since quitting: 38.0  Smokeless Tobacco Never  Marital status: Single  Tobacco Use  Smoking status: Former  Types: Cigarettes  Quit date: 1986  Years since quitting: 38.0  Smokeless tobacco: Never  Vaping Use  Vaping Use: Never used  Substance and Sexual Activity  Alcohol use: Yes  Comment: 3 x weekly  Drug use: Never   Objective:   Physical Exam Vitals reviewed.  Constitutional:  Appearance: Normal appearance.  Chest:  Breasts: Right: No inverted nipple, mass or nipple discharge.  Left: No inverted nipple, mass or nipple discharge.  Lymphadenopathy:  Upper Body:  Right upper body: No supraclavicular or axillary adenopathy.  Left upper body: No supraclavicular or axillary adenopathy.  Neurological:  Mental Status: She is alert.  Assessment and Plan:   Malignant neoplasm of upper-outer quadrant of right breast in female, estrogen receptor positive   right breast seed guided lumpectomy/sn biopsy, port placement  She has a right rc surgery and pain so need to position arm prior to surgery while  awake Will await ax [place], if she has node positive disease then needs primary systemic therapy, if not will proceed with surgery first.   We discussed the staging and pathophysiology of breast cancer. We discussed all of the different options for treatment for breast cancer including surgery, chemotherapy, radiation therapy, Herceptin, and antiestrogen therapy.  We discussed a sentinel lymph node biopsy as she does not appear to having lymph node involvement right now. We discussed the performance of that with injection of radioactive tracer. We discussed that there is a chance of having a positive node with a sentinel lymph node biopsy and we will await the permanent pathology to make any other first further decisions in terms of her treatment. We discussed up to a 5% risk lifetime of chronic shoulder pain as well as lymphedema associated with a sentinel lymph node biopsy.  We discussed the options for treatment of the breast cancer which included lumpectomy versus a mastectomy. We discussed the performance of the lumpectomy with radioactive seed placement. We discussed a 5-10% chance of a positive margin requiring reexcision in the operating room. We also discussed that she will likely need radiation therapy if she undergoes lumpectomy. We discussed mastectomy and the postoperative care for that as well. Mastectomy can be followed by reconstruction. The decision for lumpectomy vs mastectomy has no impact on decision for chemotherapy. Most mastectomy patients will not need radiation therapy. We discussed that there is no difference in her survival whether she undergoes lumpectomy with radiation therapy or antiestrogen therapy versus a mastectomy. There is also no real difference between her recurrence in the breast.  We also discussed port placement if agrees to systemic therapy.  We discussed the risks of operation including bleeding, infection, possible reoperation. She understands her further therapy  will be based on what her stages at the time of her operation.

## 2022-10-05 NOTE — Op Note (Signed)
Preoperative diagnosis: HER2 positive right breast cancer Postoperative diagnosis: Same as above Procedure: 1.  Right breast radioactive seed bracketed lumpectomy 2.  Injection of mag trace for sentinel lymph node identification 3.  Right deep axillary sentinel lymph node biopsy 4.  Left internal jugular port placement Surgeon: Dr. Serita Grammes Anesthesia: General with a pectoral block Estimated blood loss: 50 cc Complications: None Drains: None Specimens: 1.  Right breast lumpectomy containing seed and clip marked with paint 2.  Additional superior margin marked short superior, long lateral, double deep 3.  Right deep axillary sentinel lymph nodes with highest count of 99 Sponge needle count was correct completion Disposition to recovery stable condition   Indications:71 yof with screening mammogram that shows c density breast tissue and a right breast distortion just lateral to the nipple anteriorly. There is no [place] correlate. She has an ax [place] that she will get later today. 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 We discussed lumpectomy, sn biopsy and port placement.   Procedure: After informed consent was obtained she first underwent a pectoral block.  She was given antibiotics.  SCDs were in place.  She was then placed under general anesthesia without complication.  She was prepped and draped in the standard sterile surgical fashion.  Her right arm had been tucked.  Surgical timeout was then performed.   I injected 2 cc of mag trace in the subareolar position and massaged this for 5 minutes.   I first did the port.  I identified her internal jugular vein with the ultrasound.  I made a small nick in the skin.  I accessed the vein on the first pass.  The wire was placed.  This was in good position.  I confirmed this both by ultrasound as well as fluoroscopy.  I then infiltrated Marcaine below the clavicle.  I  made incision made a pocket overlying the pectoralis muscle.  I then tunneled the line between the 2 sites.  I then placed the dilator over the wire under direct vision.  The wire assembly was then removed.  The line was then passed through the peel-away sheath.  The peel-away sheath was removed.  The line was then brought back to be in the distal vena cava near the cavoatrial junction ready for use.  I then hooked this up to the port.  This was placed in the pocket.  I sutured this into position with a 2-0 Prolene suture.  This aspirated blood and flushed easily.  I packed it with heparin.  I then closed this with 3-0 Vicryl 4-0 Monocryl and glue.   The radioactive seed was in the uoq of the right breast.  I made a curvilinear incision overlying this.  I then used the neoprobe to guide excision of the  seed and the surrounding tissue with an attempt to get a clear margin.  This was then painted.  This was passed off the table and I did a mammogram to confirm removal of the seed and 2 clip.  A 3D image was done and all of my margins look clear except for the superior which looked somewhat close.  I removed this and marked this as above.  I then obtained hemostasis.  I placed clips in the cavity.  I closed the cavity down with 2-0 Vicryl suture.  The skin was closed with 3-0 Vicryl and 4 Monocryl.  Glue and  Steri-Strips were later applied.   I made incision in the low axilla.  I carried this through the axillary fascia.   I was then able to identify a brown stain node with some low level activity.   I remove this and passed these off the table as sentinel node.  I then searched the rest of the axilla and there were no other palpable nodes, brown stained nodes or any activity.  I then obtained hemostasis.  I closed this with 2-0 Vicryl 3-0 Vicryl and 4-0 Monocryl.  Glue and Steri-Strips were applied.  She tolerated this well was extubated and transferred to recovery stable condition.

## 2022-10-05 NOTE — Interval H&P Note (Signed)
History and Physical Interval Note:  10/05/2022 12:38 PM  [place]  has presented today for surgery, with the diagnosis of RIGHT BREAST CANCER.  The various methods of treatment have been discussed with the patient and family. After consideration of risks, benefits and other options for treatment, the patient has consented to  Procedure(s): RIGHT BREAST LUMPECTOMY WITH RADIOACTIVE SEED AND AXILLARY SENTINEL LYMPH NODE BIOPSY (Right) INSERTION PORT-A-CATH (N/A) as a surgical intervention.  The patient's history has been reviewed, patient examined, no change in status, stable for surgery.  I have reviewed the patient's chart and labs.  Questions were answered to the patient's satisfaction.     Rolm Bookbinder

## 2022-10-05 NOTE — Anesthesia Procedure Notes (Signed)
Procedure Name: LMA Insertion Date/Time: 10/05/2022 3:23 PM  Performed by: Maryella Shivers, CRNAPre-anesthesia Checklist: Patient identified, Emergency Drugs available, Suction available and Patient being monitored Patient Re-evaluated:Patient Re-evaluated prior to induction Oxygen Delivery Method: Circle system utilized Preoxygenation: Pre-oxygenation with 100% oxygen Induction Type: IV induction Ventilation: Mask ventilation without difficulty LMA: LMA inserted LMA Size: 4.0 Number of attempts: 1 Airway Equipment and Method: Bite block Placement Confirmation: positive ETCO2 Tube secured with: Tape Dental Injury: Teeth and Oropharynx as per pre-operative assessment

## 2022-10-05 NOTE — Anesthesia Procedure Notes (Signed)
Anesthesia Regional Block: Pectoralis block   Pre-Anesthetic Checklist: , timeout performed,  Correct Patient, Correct Site, Correct Laterality,  Correct Procedure, Correct Position, site marked,  Risks and benefits discussed,  Pre-op evaluation,  At surgeon's request and post-op pain management  Laterality: Right  Prep: Maximum Sterile Barrier Precautions used, chloraprep       Needles:  Injection technique: Single-shot  Needle Type: Echogenic Stimulator Needle     Needle Length: 9cm  Needle Gauge: 21     Additional Needles:   Procedures:,,,, ultrasound used (permanent image in chart),,    Narrative:  Start time: 10/05/2022 12:29 PM End time: 10/05/2022 12:32 PM Injection made incrementally with aspirations every 5 mL. Anesthesiologist: Freddrick March, MD

## 2022-10-05 NOTE — Discharge Instructions (Addendum)
[place] Office Phone Number [phone]  POST OP INSTRUCTIONS Take 400 mg of ibuprofen every 8 hours or 650 mg tylenol every 6 hours for next 72 hours then as needed. Use ice several times daily also.  A prescription for pain medication may be given to you upon discharge.  Take your pain medication as prescribed, if needed.  If narcotic pain medicine is not needed, then you may take acetaminophen (Tylenol), naprosyn (Alleve) or ibuprofen (Advil) as needed. Take your usually prescribed medications unless otherwise directed If you need a refill on your pain medication, please contact your pharmacy.  They will contact our office to request authorization.  Prescriptions will not be filled after 5pm or on week-ends. You should eat very light the first 24 hours after surgery, such as soup, crackers, pudding, etc.  Resume your normal diet the day after surgery. Most patients will experience some swelling and bruising in the breast.  Ice packs and a good support bra will help.  Wear the breast binder provided or a sports bra for 72 hours day and night.  After that wear a sports bra during the day until you return to the office. Swelling and bruising can take several days to resolve.  It is common to experience some constipation if taking pain medication after surgery.  Increasing fluid intake and taking a stool softener will usually help or prevent this problem from occurring.  A mild laxative (Milk of Magnesia or Miralax) should be taken according to package directions if there are no bowel movements after 48 hours. I used skin glue on the incision, you may shower in 24 hours.  The glue will flake off over the next 2-3 weeks.  Any sutures or staples will be removed at the office during your follow-up visit. ACTIVITIES:  You may resume regular daily activities (gradually increasing) beginning the next day.  Wearing a good support bra or sports bra minimizes pain and swelling.  You may have  sexual intercourse when it is comfortable. You may drive when you no longer are taking prescription pain medication, you can comfortably wear a seatbelt, and you can safely maneuver your car and apply brakes. RETURN TO WORK:  ______________________________________________________________________________________ Dennis Bast should see your doctor in the office for a follow-up appointment approximately two weeks after your surgery.  Your doctor's nurse will typically make your follow-up appointment when she calls you with your pathology report.  Expect your pathology report 3-4 business days after your surgery.  You may call to check if you do not hear from [place] after three days. OTHER INSTRUCTIONS: _______________________________________________________________________________________________ _____________________________________________________________________________________________________________________________________ _____________________________________________________________________________________________________________________________________ _____________________________________________________________________________________________________________________________________  WHEN TO CALL DR [place]: Fever over 101.0 Nausea and/or vomiting. Extreme swelling or bruising. Continued bleeding from incision. Increased pain, redness, or drainage from the incision.  The clinic staff is available to answer your questions during regular business hours.  Please don't hesitate to call and ask to speak to one of the nurses for clinical concerns.  If you have a medical emergency, go to the nearest emergency room or call 911.  A surgeon from [facility] is always on call at the hospital.  For further questions, please visit [email or web address]   Post Anesthesia Home Care Instructions  Activity: Get plenty of rest for the remainder of the day. A responsible individual must stay  with you for 24 hours following the procedure.  For the next 24 hours, DO NOT: -Drive a car -Paediatric nurse -Drink alcoholic beverages -Take any medication unless instructed by  your physician -Make any legal decisions or sign important papers.  Meals: Start with liquid foods such as gelatin or soup. Progress to regular foods as tolerated. Avoid greasy, spicy, heavy foods. If nausea and/or vomiting occur, drink only clear liquids until the nausea and/or vomiting subsides. Call your physician if vomiting continues.  Special Instructions/Symptoms: Your throat may feel dry or sore from the anesthesia or the breathing tube placed in your throat during surgery. If this causes discomfort, gargle with warm salt water. The discomfort should disappear within 24 hours.  Regional Anesthesia Blocks  1. Numbness or the inability to move the "blocked" extremity may last from 3-48 hours after placement. The length of time depends on the medication injected and your individual response to the medication. If the numbness is not going away after 48 hours, call your surgeon.  2. The extremity that is blocked will need to be protected until the numbness is gone and the  Strength has returned. Because you cannot feel it, you will need to take extra care to avoid injury. Because it may be weak, you may have difficulty moving it or using it. You may not know what position it is in without looking at it while the block is in effect.  3. For blocks in the legs and feet, returning to weight bearing and walking needs to be done carefully. You will need to wait until the numbness is entirely gone and the strength has returned. You should be able to move your leg and foot normally before you try and bear weight or walk. You will need someone to be with you when you first try to ensure you do not fall and possibly risk injury.  4. Bruising and tenderness at the needle site are common side effects and will resolve in a few  days.  5. Persistent numbness or new problems with movement should be communicated to the surgeon or the [place] [phone] [place] [phone]).  Tylenol can be taken after 6:00 pm

## 2022-10-05 NOTE — Transfer of Care (Signed)
Immediate Anesthesia Transfer of Care Note  Patient: Lysa N Bow[place]ox  Procedure(s) Performed: RIGHT BREAST LUMPECTOMY WITH RADIOACTIVE SEED AND AXILLARY SENTINEL LYMPH NODE BIOPSY (Right: Breast) INSERTION PORT-A-CATH (Left: Chest)  Patient Location: PACU  Anesthesia Type:GA combined with regional for post-op pain  Level of Consciousness: sedated  Airway & Oxygen Therapy: Patient Spontanous Breathing and Patient connected to face mask oxygen  Post-op Assessment: Report given to RN and Post -op Vital signs reviewed and stable  Post vital signs: Reviewed and stable  Last Vitals:  Vitals Value Taken Time  BP    Temp    Pulse 74 10/05/22 1642  Resp 21 10/05/22 1642  SpO2 100 % 10/05/22 1642  Vitals shown include unvalidated device data.  Last Pain:  Vitals:   10/05/22 1155  TempSrc: Oral  PainSc: 0-No pain      Patients Stated Pain Goal: 4 (49/44/96 [id number])  Complications: No notable events documented.

## 2022-10-05 NOTE — Anesthesia Preprocedure Evaluation (Addendum)
Anesthesia Evaluation  Patient identified by MRN, date of birth, ID band Patient awake    Reviewed: Allergy & Precautions, NPO status , Patient's Chart, lab work & pertinent test results  Airway Mallampati: IV  TM Distance: >3 FB Neck ROM: Full  Mouth opening: Limited Mouth Opening  Dental no notable dental hx. (+) Teeth Intact, Dental Advisory Given   Pulmonary former smoker   Pulmonary exam normal breath sounds clear to auscultation       Cardiovascular negative cardio ROS Normal cardiovascular exam Rhythm:Regular Rate:Normal     Neuro/Psych  PSYCHIATRIC DISORDERS Anxiety Depression    negative neurological ROS     GI/Hepatic Neg liver ROS,GERD  ,,  Endo/Other  Hypothyroidism    Renal/GU negative Renal ROS  negative genitourinary   Musculoskeletal  (+) Arthritis ,    Abdominal   Peds  Hematology negative hematology ROS (+)   Anesthesia Other Findings   Reproductive/Obstetrics                             Anesthesia Physical Anesthesia Plan  ASA: 2  Anesthesia Plan: General and Regional   Post-op Pain Management: Regional block* and Tylenol PO (pre-op)*   Induction: Intravenous  PONV Risk Score and Plan: 3 and Ondansetron, Dexamethasone and Midazolam  Airway Management Planned: LMA  Additional Equipment:   Intra-op Plan:   Post-operative Plan: Extubation in OR  Informed Consent: I have reviewed the patients History and Physical, chart, labs and discussed the procedure including the risks, benefits and alternatives for the proposed anesthesia with the patient or authorized representative who has indicated his/her understanding and acceptance.     Dental advisory given  Plan Discussed with: CRNA  Anesthesia Plan Comments:        Anesthesia Quick Evaluation

## 2022-10-06 ENCOUNTER — Encounter (HOSPITAL_BASED_OUTPATIENT_CLINIC_OR_DEPARTMENT_OTHER): Payer: Self-pay | Admitting: General Surgery

## 2022-10-06 NOTE — Anesthesia Postprocedure Evaluation (Signed)
Anesthesia Post Note  Patient: Judy Morrison  Procedure(s) Performed: RIGHT BREAST LUMPECTOMY WITH RADIOACTIVE SEED AND AXILLARY SENTINEL LYMPH NODE BIOPSY (Right: Breast) INSERTION PORT-A-CATH (Left: Chest)     Patient location during evaluation: PACU Anesthesia Type: Regional and General Level of consciousness: sedated and patient cooperative Pain management: pain level controlled Vital Signs Assessment: post-procedure vital signs reviewed and stable Respiratory status: spontaneous breathing Cardiovascular status: stable Anesthetic complications: no   No notable events documented.  Last Vitals:  Vitals:   10/05/22 1730 10/05/22 1741  BP: 123/68 (!) 150/76  Pulse: 89 91  Resp: 16 19  Temp:  36.7 C  SpO2: 93% 93%    Last Pain:  Vitals:   10/06/22 0954  TempSrc:   PainSc: 0-No pain                 Nolon Nations

## 2022-10-07 LAB — SURGICAL PATHOLOGY

## 2022-10-11 ENCOUNTER — Encounter: Payer: Self-pay | Admitting: *Deleted

## 2022-10-11 DIAGNOSIS — C50411 Malignant neoplasm of upper-outer quadrant of right female breast: Secondary | ICD-10-CM

## 2022-10-12 ENCOUNTER — Encounter: Payer: Self-pay | Admitting: Genetic Counselor

## 2022-10-13 ENCOUNTER — Encounter: Payer: Self-pay | Admitting: *Deleted

## 2022-10-18 ENCOUNTER — Ambulatory Visit (HOSPITAL_COMMUNITY)
Admission: RE | Admit: 2022-10-18 | Discharge: 2022-10-18 | Disposition: A | Discharge status: Home / Self Care | Payer: Medicare Other | Source: Ambulatory Visit | Attending: Hematology and Oncology | Admitting: Hematology and Oncology

## 2022-10-18 DIAGNOSIS — Z01810 Encounter for preprocedural cardiovascular examination: Secondary | ICD-10-CM | POA: Diagnosis present

## 2022-10-18 DIAGNOSIS — Z170 Estrogen receptor positive status [ER+]: Secondary | ICD-10-CM | POA: Insufficient documentation

## 2022-10-18 DIAGNOSIS — I351 Nonrheumatic aortic (valve) insufficiency: Secondary | ICD-10-CM | POA: Diagnosis not present

## 2022-10-18 DIAGNOSIS — Z0189 Encounter for other specified special examinations: Secondary | ICD-10-CM

## 2022-10-18 DIAGNOSIS — C50411 Malignant neoplasm of upper-outer quadrant of right female breast: Secondary | ICD-10-CM

## 2022-10-18 LAB — ECHOCARDIOGRAM COMPLETE
Area-P 1/2: 3.11 cm2
P 1/2 time: 408 msec
S' Lateral: 2.6 cm

## 2022-10-18 NOTE — Progress Notes (Signed)
  Echocardiogram 2D Echocardiogram has been performed.  Judy Morrison M 10/18/2022, 11:59 AM

## 2022-10-19 ENCOUNTER — Other Ambulatory Visit (HOSPITAL_COMMUNITY): Payer: Medicare Other

## 2022-10-20 ENCOUNTER — Other Ambulatory Visit (HOSPITAL_BASED_OUTPATIENT_CLINIC_OR_DEPARTMENT_OTHER): Payer: Self-pay

## 2022-10-20 ENCOUNTER — Telehealth: Payer: Self-pay | Admitting: Hematology and Oncology

## 2022-10-20 ENCOUNTER — Inpatient Hospital Stay: Payer: Medicare Other | Admitting: Pharmacist

## 2022-10-20 ENCOUNTER — Inpatient Hospital Stay: Payer: Medicare Other

## 2022-10-20 ENCOUNTER — Inpatient Hospital Stay: Payer: Medicare Other | Attending: Hematology and Oncology | Admitting: Hematology and Oncology

## 2022-10-20 ENCOUNTER — Encounter: Payer: Self-pay | Admitting: Hematology and Oncology

## 2022-10-20 ENCOUNTER — Other Ambulatory Visit: Payer: Self-pay

## 2022-10-20 VITALS — BP 143/78 | HR 107 | Temp 98.1°F | Resp 18 | Ht 63.0 in | Wt 164.9 lb

## 2022-10-20 DIAGNOSIS — Z801 Family history of malignant neoplasm of trachea, bronchus and lung: Secondary | ICD-10-CM | POA: Diagnosis not present

## 2022-10-20 DIAGNOSIS — Z5112 Encounter for antineoplastic immunotherapy: Secondary | ICD-10-CM | POA: Insufficient documentation

## 2022-10-20 DIAGNOSIS — Z87891 Personal history of nicotine dependence: Secondary | ICD-10-CM | POA: Insufficient documentation

## 2022-10-20 DIAGNOSIS — Z5111 Encounter for antineoplastic chemotherapy: Secondary | ICD-10-CM | POA: Diagnosis present

## 2022-10-20 DIAGNOSIS — C50411 Malignant neoplasm of upper-outer quadrant of right female breast: Secondary | ICD-10-CM | POA: Diagnosis not present

## 2022-10-20 DIAGNOSIS — Z170 Estrogen receptor positive status [ER+]: Secondary | ICD-10-CM | POA: Diagnosis not present

## 2022-10-20 DIAGNOSIS — R0609 Other forms of dyspnea: Secondary | ICD-10-CM | POA: Insufficient documentation

## 2022-10-20 DIAGNOSIS — Z8049 Family history of malignant neoplasm of other genital organs: Secondary | ICD-10-CM | POA: Insufficient documentation

## 2022-10-20 MED ORDER — ONDANSETRON HCL 8 MG PO TABS
8.0000 mg | ORAL_TABLET | Freq: Three times a day (TID) | ORAL | 1 refills | Status: DC | PRN
  Filled 2022-10-20: qty 30, 10d supply, fill #0

## 2022-10-20 MED ORDER — PROCHLORPERAZINE MALEATE 10 MG PO TABS
10.0000 mg | ORAL_TABLET | Freq: Four times a day (QID) | ORAL | 1 refills | Status: DC | PRN
  Filled 2022-10-20: qty 30, 8d supply, fill #0

## 2022-10-20 MED ORDER — LIDOCAINE-PRILOCAINE 2.5-2.5 % EX CREA
1.0000 | TOPICAL_CREAM | Freq: Every day | CUTANEOUS | 3 refills | Status: DC
  Filled 2022-10-20: qty 30, 30d supply, fill #0

## 2022-10-20 NOTE — Telephone Encounter (Signed)
Spoke with patient confirming all upcoming appointments  

## 2022-10-20 NOTE — Progress Notes (Signed)
START ON PATHWAY REGIMEN - Breast     Cycle 1: A cycle is 7 days:     Trastuzumab-xxxx      Paclitaxel    Cycles 2 through 12: A cycle is every 7 days:     Trastuzumab-xxxx      Paclitaxel    Cycles 13 through 25: A cycle is every 21 days:     Trastuzumab-xxxx   **Always confirm dose/schedule in your pharmacy ordering system**  Patient Characteristics: Postoperative without Neoadjuvant Therapy (Pathologic Staging), Invasive Disease, Adjuvant Therapy, HER2 Positive, ER Positive, Node Negative, pT1a, pN0/N57m, Chemotherapy Indicated Therapeutic Status: Postoperative without Neoadjuvant Therapy (Pathologic Staging) AJCC Grade: G2 AJCC N Category: pN0 AJCC M Category: cM0 ER Status: Positive (+) AJCC 8 Stage Grouping: IA HER2 Status: Positive (+) Oncotype Dx Recurrence Score: Not Appropriate AJCC T Category: pT1a PR Status: Positive (+) Intervention Indicated: Chemotherapy Intent of Therapy: Curative Intent, Discussed with Patient

## 2022-10-20 NOTE — Progress Notes (Signed)
[facility] CONSULT NOTE  Patient Care Team: Colin Mulders, PA as PCP - General (Family Medicine) Shan Levans, Alcario Drought, MD (Inactive) as Resident (Family Medicine) Benay Pike, MD as Consulting Physician (Hematology and Oncology) Kyung Rudd, MD as Consulting Physician (Radiation Oncology) Rolm Bookbinder, MD as Consulting Physician (General Surgery)  CHIEF COMPLAINTS/PURPOSE OF CONSULTATION:  Newly diagnosed breast cancer  HISTORY OF PRESENTING ILLNESS:  Judy Morrison 72 y.o. female is here because of recent diagnosis of right breast cancer  I reviewed her records extensively and collaborated the history with the patient.  SUMMARY OF ONCOLOGIC HISTORY: Oncology History  Malignant neoplasm of upper-outer quadrant of right breast in female, estrogen receptor positive ([place])  08/02/2022 Mammogram   In the right breast, possible distortion warrants further evaluation. In the left breast, no findings suspicious for malignancy. Diagnostic mammogram showed persistent subtle distortion central slightly LATERAL aspect of the RIGHT breast warranting tissue diagnosis.     09/01/2022 Pathology Results   Final pathology showed grade 2 invasive mammary carcinoma, prognostic showed ER 90% positive strong staining PR 90% positive strong staining, Ki-67 of 5% and HER2 3+ by [facility]   09/10/2022 Initial Diagnosis   Malignant neoplasm of upper-outer quadrant of right breast in female, estrogen receptor positive ([place])    Genetic Testing   [facility] CancerNext-Expanded Panel+RNA was Positive. 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 Report date is 10/04/2022.  The CancerNext-Expanded gene panel offered by [facility] and includes sequencing, rearrangement, and RNA analysis for the following 77 genes: AIP, ALK, APC, ATM, AXIN2, BAP1, BARD1, BLM, BMPR1A, BRCA1, BRCA2, BRIP1,  CDC73, CDH1, CDK4, CDKN1B, CDKN2A, CHEK2, CTNNA1, DICER1, FANCC, FH, FLCN, GALNT12, KIF1B, LZTR1, MAX, MEN1, MET, MLH1, MSH2, MSH3, MSH6, MUTYH, NBN, NF1, NF2, NTHL1, PALB2, PHOX2B, PMS2, POT1, PRKAR1A, PTCH1, PTEN, RAD51C, RAD51D, RB1, RECQL, RET, SDHA, SDHAF2, SDHB, SDHC, SDHD, SMAD4, SMARCA4, SMARCB1, SMARCE1, STK11, SUFU, TMEM127, TP53, TSC1, TSC2, VHL and XRCC2 (sequencing and deletion/duplication); EGFR, EGLN1, HOXB13, KIT, MITF, PDGFRA, POLD1, and POLE (sequencing only); EPCAM and GREM1 (deletion/duplication only).     She is now here for follow up today. Since last visit, she had lumpectomy which showed residual IDC, 3 mm in greatest dimension on excision. She is healing well, has a postop visit scheduled for the end of February Rest of the pertinent 10 point ROS reviewed and negative.  MEDICAL HISTORY:  Past Medical History:  Diagnosis Date   Anemia    Anxiety    Arthritis    Depressed    Dermatitis    rare form, on prednisone   GERD (gastroesophageal reflux disease)    controlled with Omeprazole   Glaucoma    Torn rotator cuff    RIGHT    Urinary tract infection    dx 05-26-17 took 1 week cipro BID completed 06-02-17.    SURGICAL HISTORY: Past Surgical History:  Procedure Laterality Date   ADENOIDECTOMY     APPENDECTOMY     arthroscopic right knee      BREAST BIOPSY Right 09/01/2022   MM RT BREAST BX W LOC DEV 1ST LESION IMAGE BX SPEC STEREO GUIDE 09/01/2022 [facility]   BREAST BIOPSY  10/01/2022   MM RT RADIOACTIVE SEED LOC MAMMO GUIDE 10/01/2022 [facility]   BREAST LUMPECTOMY WITH RADIOACTIVE SEED AND SENTINEL LYMPH NODE BIOPSY Right 10/05/2022   Procedure: RIGHT BREAST LUMPECTOMY WITH RADIOACTIVE SEED AND AXILLARY SENTINEL LYMPH NODE  BIOPSY;  Surgeon: Rolm Bookbinder, MD;  Location: [facility];  Service: General;  Laterality: Right;   CHOLECYSTECTOMY     COLONOSCOPY  06/06/2017   [place]   Pelvic sling     x2   POLYPECTOMY      PORTACATH PLACEMENT Left 10/05/2022   Procedure: INSERTION PORT-A-CATH;  Surgeon: Rolm Bookbinder, MD;  Location: [facility];  Service: General;  Laterality: Left;   ROTATOR CUFF REPAIR Right    TONSILLECTOMY     TOTAL KNEE ARTHROPLASTY Right 08/12/2017   Procedure: RIGHT TOTAL KNEE ARTHROPLASTY;  Surgeon: Sydnee Cabal, MD;  Location: [facility];  Service: Orthopedics;  Laterality: Right;   TOTAL KNEE ARTHROPLASTY Left 01/06/2018   Procedure: LEFT TOTAL KNEE ARTHROPLASTY;  Surgeon: Sydnee Cabal, MD;  Location: [facility];  Service: Orthopedics;  Laterality: Left;   UPPER GASTROINTESTINAL ENDOSCOPY     WISDOM TOOTH EXTRACTION      SOCIAL HISTORY: Social History   Socioeconomic History   Marital status: Single    Spouse name: Not on file   Number of children: Not on file   Years of education: Not on file   Highest education level: Not on file  Occupational History   Not on file  Tobacco Use   Smoking status: Former    Types: Cigarettes    Quit date: 56    Years since quitting: 38.1   Smokeless tobacco: Never  Vaping Use   Vaping Use: Never used  Substance and Sexual Activity   Alcohol use: Yes    Alcohol/week: 2.0 standard drinks of alcohol    Types: 2 Cans of beer per week   Drug use: No   Sexual activity: Not on file  Other Topics Concern   Not on file  Social History Narrative   Not on file   Social Determinants of Health   Financial Resource Strain: Not on file  Food Insecurity: No Food Insecurity (09/15/2022)   Hunger Vital Sign    Worried About Running Out of Food in the Last Year: Never true    Ran Out of Food in the Last Year: Never true  Transportation Needs: No Transportation Needs (09/15/2022)   PRAPARE - Hydrologist (Medical): No    Lack of Transportation (Non-Medical): No  Physical Activity: Not on file  Stress: Not on file  Social Connections: Not on file  Intimate Partner Violence: Not on file    FAMILY  HISTORY: Family History  Problem Relation Age of Onset   Bowel Disease Mother        ishemic bowel   Lung cancer Mother        smoked   Cervical cancer Paternal Aunt    Cancer Maternal Grandfather        unknown type   Lung cancer Paternal Grandfather        smoked   Colon cancer Neg Hx    Breast cancer Neg Hx    Esophageal cancer Neg Hx    Pancreatic cancer Neg Hx    Prostate cancer Neg Hx    Rectal cancer Neg Hx    Stomach cancer Neg Hx    Allergic rhinitis Neg Hx    Asthma Neg Hx    Eczema Neg Hx    Urticaria Neg Hx    Colon polyps Neg Hx     ALLERGIES:  is allergic to penicillin g, cephalexin, codeine, erythromycin, and penicillins.  MEDICATIONS:  Current Outpatient Medications  Medication Sig  Dispense Refill   Adapalene 0.3 % gel Apply 1 application daily as needed topically (for acne).     ALPRAZolam (XANAX) 0.25 MG tablet Take 0.25 mg by mouth daily as needed for anxiety.   1   Brexpiprazole (REXULTI) 1 MG TABS Take 1 mg by mouth daily after breakfast.      Cholecalciferol (VITAMIN D3) 1000 units CAPS Take 1,000 Units at bedtime by mouth.     clonazePAM (KLONOPIN) 0.5 MG tablet Take 0.25 mg by mouth 2 (two) times daily.     clotrimazole (MYCELEX) 10 MG troche Take 10 mg by mouth as directed. 2 times per week     DULoxetine HCl 40 MG CPEP Take 40 mg by mouth daily after breakfast.  1   hydrOXYzine (VISTARIL) 25 MG capsule Take 25-50 mg by mouth daily.     latanoprost (XALATAN) 0.005 % ophthalmic solution Place 1 drop into both eyes at bedtime.     levothyroxine (SYNTHROID, LEVOTHROID) 50 MCG tablet Take 50 mcg at bedtime by mouth.      lidocaine 4 % Place 1 patch onto the skin 2 (two) times daily. 10 patch 0   loratadine (CLARITIN) 10 MG tablet Take 10 mg by mouth daily.     Multiple Vitamins-Minerals (HAIR SKIN & NAILS ADVANCED PO) Take by mouth.     nortriptyline (PAMELOR) 50 MG capsule Take 100 mg at bedtime by mouth.      omeprazole (PRILOSEC) 40 MG capsule Take  40 mg by mouth daily before breakfast.     rosuvastatin (CRESTOR) 5 MG tablet Take 5 mg by mouth daily.     simvastatin (ZOCOR) 10 MG tablet Take 10 mg by mouth daily.     TACROLIMUS PO Take 1 mg by mouth 2 (two) times daily. Per patient - Dissolve capsule in one half liter of H2O. Swish and spit twice daily     traMADol (ULTRAM) 50 MG tablet Take 1 tablet (50 mg total) by mouth every 6 (six) hours as needed. 10 tablet 0   triamcinolone cream (KENALOG) 0.1 % Apply 1 Application topically 2 (two) times daily.     valACYclovir (VALTREX) 1000 MG tablet Take 1,000 mg by mouth daily as needed.     VITAMIN D, ERGOCALCIFEROL, PO Take 4,000 Units by mouth daily.     No current facility-administered medications for this visit.    REVIEW OF SYSTEMS:   Constitutional: Denies fevers, chills or abnormal night sweats Eyes: Denies blurriness of vision, double vision or watery eyes Ears, nose, mouth, throat, and face: Denies mucositis or sore throat Respiratory: Denies cough, dyspnea or wheezes Cardiovascular: Denies palpitation, chest discomfort or lower extremity swelling Gastrointestinal:  Denies nausea, heartburn or change in bowel habits Skin: Denies abnormal skin rashes Lymphatics: Denies new lymphadenopathy or easy bruising Neurological:Denies numbness, tingling or new weaknesses Behavioral/Psych: Mood is stable, no new changes  Breast: Denies any palpable lumps or discharge All other systems were reviewed with the patient and are negative.  PHYSICAL EXAMINATION: ECOG PERFORMANCE STATUS: 0 - Asymptomatic  Vitals:   10/20/22 0833  BP: (!) 143/78  Pulse: (!) 107  Resp: 18  Temp: 98.1 F (36.7 C)  SpO2: 99%    Filed Weights   10/20/22 0833  Weight: 164 lb 14.4 oz (74.8 kg)    PE deferred in lieu of counseling  LABORATORY DATA:  I have reviewed the data as listed Lab Results  Component Value Date   WBC 7.5 09/15/2022   HGB 11.7 (L) 09/15/2022  HCT 35.1 (L) 09/15/2022   MCV  81.8 09/15/2022   PLT 337 09/15/2022   Lab Results  Component Value Date   NA 137 09/15/2022   K 3.9 09/15/2022   CL 102 09/15/2022   CO2 28 09/15/2022    RADIOGRAPHIC STUDIES: I have personally reviewed the radiological reports and agreed with the findings in the report.  ASSESSMENT AND PLAN:  Malignant neoplasm of upper-outer quadrant of right breast in female, estrogen receptor positive ([place]) This is a very pleasant 72 year old postmenopausal patient with newly diagnosed right breast IDC, ER and PR positive, HER2 positive by IHC, Ki 67 of 5%? referred to breast [place] for additional recommendations. On the original biopsy, the tumor measured about 10 mm.  She most recently underwent lumpectomy which showed an additional 3 mm of invasive carcinoma.  Given HER2 amplified breast cancer, despite the size especially with the larger sample removed during the biopsy, we have discussed about considering Taxol and Herceptin.  We have today discussed about the adverse effects of chemotherapy including but not limited to fatigue, nausea, vomiting, diarrhea, increased risk of infections, cardiotoxicity, neuropathy, hair loss etc.  She understands that some of the side effects can be permanent and life-threatening.  We have discussed about cold cap, she is not quite sure if she is interested in this approach at this time.  She confirmed that she is not quite interested in the cold cap.  I have also discussed about the SWOG study for cooling devices and reducing the risk of peripheral neuropathy.  She is interested in the study. She understands that she needs to continue Herceptin for a total of 1 year.  She understands the need for echocardiograms every 3 months and the small risk of cardiotoxicity which is mostly reversible.  After completion of radiation, she will start on antiestrogen therapy for 5 years. All her questions were answered to the best of my knowledge.  Thank you for consulting [place] in the  care of this patient.  Please do not hesitate to contact us with any questions or concerns.   Total time spent: 30 minutes including history, physical exam, review of records, counseling and coordination of care All questions were answered. The patient knows to call the clinic with any problems, questions or concerns.    Benay Pike, MD 10/20/22

## 2022-10-20 NOTE — Research (Signed)
Trial Name:  S2205, ICE COMPRESS: RANDOMIZED TRIAL OF LIMB CRYOCOMPRESSION VERSUS CONTINUOUS COMPRESSION VERSUS LOW CYCLIC COMPRESSION FOR THE PREVENTION  OF TAXANE-INDUCED PERIPHERAL NEUROPATHY  Patient Judy Morrison was identified by Dr Chryl Heck as a potential candidate for the above listed study.  This Clinical Research Nurse met with Judy Morrison, [id number] on 10/20/22 in a manner and location that ensures patient privacy to discuss participation in the above listed research study.  Patient is Accompanied by her daughter .  Patient was previously provided with informed consent documents.  Patient confirmed they have read the informed consent documents.  As outlined in the informed consent form, this Nurse and Judy Morrison discussed the purpose of the research study, the investigational nature of the study, study procedures and requirements for study participation, potential risks and benefits of study participation, as well as alternatives to participation.  This study is not blinded or double-blinded. The patient understands participation is voluntary and they may withdraw from study participation at any time.  This study does not involve randomization.  Potential side effects were reviewed with patient as outlined in the consent form, and patient made aware there may be side effects not yet known. This study does not involve a placebo. Patient understands enrollment is pending full eligibility review.   Confidentiality and how the patient's information will be used as part of study participation were discussed.  Patient was informed there is not reimbursement provided for their time and effort spent on trial participation.  The patient is encouraged to discuss research study participation with their insurance provider to determine what costs they may incur as part of study participation, including research related injury.    All questions were answered to patient's satisfaction.   The informed consent and separate HIPAA Authorization was reviewed page by page.  The patient's mental and emotional status is appropriate to provide informed consent, and the patient verbalizes an understanding of study participation.  Patient has agreed to participate in the above listed research study and has voluntarily signed the informed consent version date 05/04/2022 and separate HIPAA Authorization, version date 08/25/2022  on 10/20/22 at 1115AM.  The patient was provided with a copy of the signed informed consent form and separate HIPAA Authorization for their reference.  No study specific procedures were obtained prior to the signing of the informed consent document.  Approximately 15 minutes were spent with the patient reviewing the informed consent documents.  Patient was not requested to complete a Release of Information form.  Judy Morrison Judy Kosta, RN, BSN, [facility] She  Her  Hers Clinical Research Nurse [facility] Direct Dial [phone]  Pager [phone] 10/20/2022 11:46 AM

## 2022-10-20 NOTE — Assessment & Plan Note (Signed)
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 referred to breast [place] for additional recommendations. On the original biopsy, the tumor measured about 10 mm.  She most recently underwent lumpectomy which showed an additional 3 mm of invasive carcinoma.  Given HER2 amplified breast cancer, despite the size especially with the larger sample removed during the biopsy, we have discussed about considering Taxol and Herceptin.  We have today discussed about the adverse effects of chemotherapy including but not limited to fatigue, nausea, vomiting, diarrhea, increased risk of infections, cardiotoxicity, neuropathy, hair loss etc.  She understands that some of the side effects can be permanent and life-threatening.  We have discussed about cold cap, she is not quite sure if she is interested in this approach at this time.  She confirmed that she is not quite interested in the cold cap.  I have also discussed about the SWOG study for cooling devices and reducing the risk of peripheral neuropathy.  She is interested in the study. She understands that she needs to continue Herceptin for a total of 1 year.  She understands the need for echocardiograms every 3 months and the small risk of cardiotoxicity which is mostly reversible.  After completion of radiation, she will start on antiestrogen therapy for 5 years. All her questions were answered to the best of my knowledge.  Thank you for consulting [place] in the care of this patient.  Please do not hesitate to contact us with any questions or concerns.

## 2022-10-20 NOTE — Research (Signed)
S2205, ICE COMPRESS: RANDOMIZED TRIAL OF LIMB CRYOCOMPRESSION VERSUS CONTINUOUS COMPRESSION VERSUS LOW CYCLIC COMPRESSION FOR THE PREVENTION  OF TAXANE-INDUCED PERIPHERAL NEUROPATHY  Confirmed the following with patient:  No skin or limb metastases. No history of neurotoxic chemotherapy. No pre-existing clinical peripheral neuropathy from any cause. No history of Raynaud's, cold agglutinin disease, cryoglobulinema, cryofibrinogenemia, post-traumatic cold dystrophy, or peripheral arterial ischemia.  PROs were completed after consent but before any other study assessments.  Neuropathy assessment was completed by this RN.  Plan for patient randomization within 3 days of scheduled chemo. Patient reports chemo won't be started until she follows up with Dr Donne Hazel later this month. Reviewed clothing recommendations for treatment, and provided informational handout. Patient denies further questions at this time; I provided my contact information in case that changes.  Marjie Skiff Statia Burdick, RN, BSN, [facility] She  Her  Hers Clinical Research Nurse [place] [phone]  Pager [phone] 10/20/2022 11:52 AM

## 2022-10-20 NOTE — Progress Notes (Signed)
[place]       Telephone: [phone]?Fax: [phone]   Oncology Clinical Pharmacist Practitioner Initial Assessment  Judy Morrison is a 72 y.o. female with a diagnosis of breast cancer. They were contacted today via in-person visit. She is accompanied by her daughter Judy Morrison.  Indication/Regimen Trastuzumab (Herceptin) and Paclitaxel (Taxol) are being used appropriately for treatment of breast cancer by Dr. Benay Pike.      Wt Readings from Last 1 Encounters:  10/20/22 164 lb 14.4 oz (74.8 kg)    Estimated body surface area is 1.82 meters squared as calculated from the following:   Height as of an earlier encounter on 10/20/22: '5\' 3"'$  (1.6 m).   Weight as of an earlier encounter on 10/20/22: 164 lb 14.4 oz (74.8 kg).  The dosing regimen is every 7 days for 12 cycles  Trastuzumab (4 mg/kg load, 2 mg/kg maintenance) on Day 1 Paclitaxel (80 mg/m2) on Day 1  Dose Modifications Plan not in yet but no dosing modifications were discussed by Dr. Chryl Heck initially   Allergies Allergies  Allergen Reactions   Penicillin G Anaphylaxis   Cephalexin Other (See Comments)   Codeine Nausea Only   Erythromycin Itching   Penicillins Itching, Swelling and Other (See Comments)    Has patient had a PCN reaction causing immediate rash, facial/tongue/throat swelling, SOB or lightheadedness with hypotension: Yes Has patient had a PCN reaction causing severe rash involving mucus membranes or skin necrosis: No Has patient had a PCN reaction that required hospitalization: No Has patient had a PCN reaction occurring within the last 10 years: No If all of the above answers are "NO", then may proceed with Cephalosporin use.     Vitals    10/20/2022    8:33 AM 10/05/2022    5:41 PM 10/05/2022    5:30 PM  Oncology Vitals  Height 160 cm    Weight 74.798 kg    Weight (lbs) 164 lbs 14 oz    BMI 29.21 kg/m2   29.21 kg/m2    Temp 98.1 F (36.7 C) 98 F (36.7 C)   Pulse Rate  107 91 89  BP 143/78 150/76 123/68  Resp '18 19 16  '$ SpO2 99 % 93 % 93 %  BSA (m2) 1.82 m2   1.82 m2       Laboratory Data = no labs were assessed today for this education visit  Contraindications Contraindications were reviewed? Yes Contraindications to therapy were identified? No   Safety Precautions (written information also provided) The following safety precautions for the use of trastuzumab + paclitaxel were reviewed:  Fever: reviewed the importance of having a thermometer and the Centers for Disease Control and Prevention (CDC) definition of fever which is 100.7F (38C) or higher. Patient should call 24/7 triage at [phone] if experiencing a fever or any other symptoms Decreased white blood cells (WBCs) and increased risk for infection Decreased platelet count and increased risk of bleeding Decreased hemoglobin, part of the red blood cells that carry iron and oxygen Hair Loss Peripheral Neuropathy Muscle or joint pain or weakness Mouth Irritation or sores Nausea or vomiting Diarrhea or constipation Nail Changes Fatigue Hypersensitivity reactions Limit alcohol consumption Headache Cardiotoxicity Pneumonitis  Medication Reconciliation Current Outpatient Medications  Medication Sig Dispense Refill   triamcinolone cream (KENALOG) 0.1 % Apply 1 Application topically 2 (two) times daily.     Adapalene 0.3 % gel Apply 1 application daily as needed topically (for acne).     ALPRAZolam (  XANAX) 0.25 MG tablet Take 0.25 mg by mouth daily as needed for anxiety.   1   Brexpiprazole (REXULTI) 1 MG TABS Take 1 mg by mouth daily after breakfast.      Cholecalciferol (VITAMIN D3) 1000 units CAPS Take 1,000 Units at bedtime by mouth.     clonazePAM (KLONOPIN) 0.5 MG tablet Take 0.25 mg by mouth 2 (two) times daily.     clotrimazole (MYCELEX) 10 MG troche Take 10 mg by mouth as directed. 2 times per week     DULoxetine HCl 40 MG CPEP Take 40 mg by mouth daily after breakfast.  1    hydrOXYzine (VISTARIL) 25 MG capsule Take 25-50 mg by mouth daily.     latanoprost (XALATAN) 0.005 % ophthalmic solution Place 1 drop into both eyes at bedtime.     levothyroxine (SYNTHROID, LEVOTHROID) 50 MCG tablet Take 50 mcg at bedtime by mouth.      lidocaine 4 % Place 1 patch onto the skin 2 (two) times daily. 10 patch 0   loratadine (CLARITIN) 10 MG tablet Take 10 mg by mouth daily.     Multiple Vitamins-Minerals (HAIR SKIN & NAILS ADVANCED PO) Take by mouth.     nortriptyline (PAMELOR) 50 MG capsule Take 100 mg at bedtime by mouth.      omeprazole (PRILOSEC) 40 MG capsule Take 40 mg by mouth daily before breakfast.     rosuvastatin (CRESTOR) 5 MG tablet Take 5 mg by mouth daily.     simvastatin (ZOCOR) 10 MG tablet Take 10 mg by mouth daily.     TACROLIMUS PO Take 1 mg by mouth 2 (two) times daily. Per patient - Dissolve capsule in one half liter of H2O. Swish and spit twice daily     traMADol (ULTRAM) 50 MG tablet Take 1 tablet (50 mg total) by mouth every 6 (six) hours as needed. 10 tablet 0   valACYclovir (VALTREX) 1000 MG tablet Take 1,000 mg by mouth daily as needed.     VITAMIN D, ERGOCALCIFEROL, PO Take 4,000 Units by mouth daily.     No current facility-administered medications for this visit.    Medication reconciliation is based on the patient's most recent medication list in the electronic medical record (EMR) including herbal products and OTC medications.   The patient's medication list was reviewed today with the patient? Yes   Drug-drug interactions (DDIs) DDIs were evaluated? Yes Significant DDIs identified? No   Drug-Food Interactions Drug-food interactions were evaluated? Yes Drug-food interactions identified? No   Follow-up Plan  Treatment start date: TBD. Tentatively 11/08/22. Dr. Chryl Heck prefers to start on a Monday Port placement date: 10/05/22 ECHO date: 10/18/22 Prescriptions, premedications, and chemotherapy regimen was discussed as well as potential  side effects Dr. Chryl Heck discussed patient's two upcoming April trips (one to [place], [place] via automobile, one to [employer] via airplane). Dr. Chryl Heck stated as long as patient practices safety with wearing masks, etc the [place] is reasonable because it is outside. Dr. Chryl Heck would like to reassess the [employer] trip once patient has had a few rounds of chemotherapy Clinical pharmacy will assist Dr. Chryl Heck and Ms. Geron on an as needed basis going forward  [place] participated in the discussion, expressed understanding, and voiced agreement with the above plan. All questions were answered to her satisfaction. The patient was advised to contact the clinic at [phone] with any questions or concerns prior to her return visit.   I spent 60 minutes  assessing the patient.  Raina Mina, RPH-CPP, 10/20/2022 9:40 AM  **Disclaimer: This note was dictated with voice recognition software. Similar sounding words can inadvertently be transcribed and this note may contain transcription errors which may not have been corrected upon publication of note.**

## 2022-10-21 ENCOUNTER — Other Ambulatory Visit: Payer: Self-pay

## 2022-10-22 ENCOUNTER — Other Ambulatory Visit: Payer: Self-pay

## 2022-10-22 ENCOUNTER — Encounter: Payer: Self-pay | Admitting: *Deleted

## 2022-10-23 ENCOUNTER — Other Ambulatory Visit: Payer: Self-pay

## 2022-10-24 NOTE — Therapy (Signed)
OUTPATIENT PHYSICAL THERAPY BREAST CANCER POST OP FOLLOW UP   Patient Name: Judy Morrison MRN: [id number] DOB:1951-02-22, 72 y.o., female Today's Date: 10/25/2022  END OF SESSION:  PT End of Session - 10/25/22 0955     Visit Number 2    Number of Visits 10    Date for PT Re-Evaluation 11/22/22    PT Start Time 1000    PT Stop Time 1050    PT Time Calculation (min) 50 min    Activity Tolerance Patient tolerated treatment well    Behavior During Therapy [facility] for tasks assessed/performed             Past Medical History:  Diagnosis Date   Anemia    Anxiety    Arthritis    Depressed    Dermatitis    rare form, on prednisone   GERD (gastroesophageal reflux disease)    controlled with Omeprazole   Glaucoma    Torn rotator cuff    RIGHT    Urinary tract infection    dx 05-26-17 took 1 week cipro BID completed 06-02-17.   Past Surgical History:  Procedure Laterality Date   ADENOIDECTOMY     APPENDECTOMY     arthroscopic right knee      BREAST BIOPSY Right 09/01/2022   MM RT BREAST BX W LOC DEV 1ST LESION IMAGE BX SPEC STEREO GUIDE 09/01/2022 [facility]   BREAST BIOPSY  10/01/2022   MM RT RADIOACTIVE SEED LOC MAMMO GUIDE 10/01/2022 [facility]   BREAST LUMPECTOMY WITH RADIOACTIVE SEED AND SENTINEL LYMPH NODE BIOPSY Right 10/05/2022   Procedure: RIGHT BREAST LUMPECTOMY WITH RADIOACTIVE SEED AND AXILLARY SENTINEL LYMPH NODE BIOPSY;  Surgeon: Rolm Bookbinder, MD;  Location: [facility];  Service: General;  Laterality: Right;   CHOLECYSTECTOMY     COLONOSCOPY  06/06/2017   [place]   Pelvic sling     x2   POLYPECTOMY     PORTACATH PLACEMENT Left 10/05/2022   Procedure: INSERTION PORT-A-CATH;  Surgeon: Rolm Bookbinder, MD;  Location: [facility];  Service: General;  Laterality: Left;   ROTATOR CUFF REPAIR Right    TONSILLECTOMY     TOTAL KNEE ARTHROPLASTY Right 08/12/2017   Procedure: RIGHT TOTAL KNEE ARTHROPLASTY;   Surgeon: Sydnee Cabal, MD;  Location: [facility];  Service: Orthopedics;  Laterality: Right;   TOTAL KNEE ARTHROPLASTY Left 01/06/2018   Procedure: LEFT TOTAL KNEE ARTHROPLASTY;  Surgeon: Sydnee Cabal, MD;  Location: [facility];  Service: Orthopedics;  Laterality: Left;   UPPER GASTROINTESTINAL ENDOSCOPY     WISDOM TOOTH EXTRACTION     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   Malignant neoplasm of upper-outer quadrant of right breast in female, estrogen receptor positive ([place]) 09/10/2022   Rash and other nonspecific skin eruption 09/15/2020   Allergic contact dermatitis 09/15/2020   Pruritus 08/14/2020   Dizziness 05/10/2018   Atypical chest pain 05/10/2018   Primary osteoarthritis of left knee 01/06/2018   S/P knee replacement 08/12/2017   Incomplete tear of right rotator cuff 05/19/2017   Osteopenia 05/19/2017   Urge incontinence of urine 05/19/2017   Primary open angle glaucoma (POAG) 12/30/2016   Primary osteoarthritis of both knees 12/30/2016   Recurrent oral herpes simplex infection 12/30/2016   Tubular adenoma of colon 12/30/2016   Hypothyroidism 06/25/2016   Right foot pain 12/19/2014   Anxiety 05/21/2011   Seasonal allergies 05/21/2011  REFERRING PROVIDER: Dr. Rolm Bookbinder  REFERRING DIAG: Right Breast Cancer  THERAPY DIAG:  Malignant neoplasm of upper-outer quadrant of right breast in female, estrogen receptor positive ([place])  Abnormal posture  Chronic right shoulder pain  Stiffness of right shoulder, not elsewhere classified  Rationale for Evaluation and Treatment: Rehabilitation  ONSET DATE: 08/02/2022-  SUBJECTIVE:                                                                                                                                                                                           SUBJECTIVE STATEMENT: The port gave me a fit with pain, but it is  better now. The incision where they took the LN"s is really sore and my medial arm.  PERTINENT HISTORY:  Patient was diagnosed on 08/02/2022 with right grade 2 invasive ductal carcinoma breast cancer. It measures 1.5 cm and is located in the upper outer quadrant. It is triple positive with a Ki67 of 5%. She had a rotator cuff repair in her right shoulder 10/01/2021 which continues to be problematic. She had a right lumpectomy with SLNB on 10/05/2022    PATIENT GOALS:  Reassess how my recovery is going related to arm function, pain, and swelling.  PAIN:  Are you having pain? Yes: NPRS scale: 2-3/10 Pain location: Medial right UE Pain description: feels like it is rubbed raw Aggravating factors: maybe the bra Relieving factors: ice?  PRECAUTIONS: Recent Surgery, right UE Lymphedema risk, Other: right RTC repair 1 year ago  ACTIVITY LEVEL / LEISURE: walking 30 min almost every day.   OBJECTIVE:   PATIENT SURVEYS:  QUICK DASH: 22.73  OBSERVATIONS: Several cords noted in axillary region with atleast 1 extending beyond antecubital fossa. Significant UT compensation due to weakness/prior RTC repair. Steri strips still present over axillary and breast incision, No visible swelling. Small area of mild fibrosis/scar tissue at axillary incision.  POSTURE:  Forward head, rounded shoulders  LYMPHEDEMA ASSESSMENT:    UPPER EXTREMITY AROM/PROM:   A/PROM RIGHT   eval   RIGHT 09/24/2022  Shoulder extension 37 40  Shoulder flexion 112 and painful 114  Shoulder abduction 140 and painful 60 significant UT compensation/popping  Shoulder internal rotation 39 with scapular compensation 65 supine  Shoulder external rotation 67 with scapular compensation 95 supine                          (Blank rows = not tested)   A/PROM LEFT   eval  Shoulder extension 51  Shoulder flexion 151  Shoulder abduction 158  Shoulder internal rotation 59  Shoulder external rotation 74                           (  Blank rows = not tested)   CERVICAL AROM: All within normal limits   UPPER EXTREMITY STRENGTH: WNL   LYMPHEDEMA ASSESSMENTS:    LANDMARK RIGHT   eval RIGHT 09/24/2022  10 cm proximal to olecranon process 26.8 27.3  Olecranon process 22.6 22.6  10 cm proximal to ulnar styloid process 20.2 20.0  Just proximal to ulnar styloid process 14.5 14.7  Across hand at thumb web space 17.5 18  At base of 2nd digit 5.9 5.6  (Blank rows = not tested)   LANDMARK LEFT   eval  10 cm proximal to olecranon process 27.6  Olecranon process 22.8  10 cm proximal to ulnar styloid process 19.8  Just proximal to ulnar styloid process 14.3  Across hand at thumb web space 18.3  At base of 2nd digit 5.7  (Blank rows = not tested)   Surgery type/Date: 10/05/2022 right Lumpectomy with SLNB Number of lymph nodes removed: 0/2 Current/past treatment (chemo, radiation, hormone therapy): pending radiation, anti estrogens and Herceptin Other symptoms:  Heaviness/tightness No Pain Yes Pitting edema No Infections No Decreased scar mobility Yes Stemmer sign No  PATIENT EDUCATION:  Education details: Cording, NTS on wall, SOZO screens, ABC class, Made foam pads for lower border of bra to prevent rolling and right axillary region, scar massage Person educated: Patient Education method: Explanation, demonstation, handout Education comprehension: verbalized understanding  HOME EXERCISE PROGRAM: Reviewed previously given post op HEP. Tried abduction wall slide and emphasized scapular depression. Able to do but sometimes with compensation and not feeling a lot of stretch. Showed chest stretch with right hand behind her an wall and oscillating wrist to stretch cording. Gave illustrated picture  ASSESSMENT:  CLINICAL IMPRESSION: Patient was diagnosed on 08/02/2022 with right grade 2 invasive ductal carcinoma breast cancer. It measures 1.5 cm and is located in the upper outer quadrant. It is triple positive with  a Ki67 of 5%. She had a rotator cuff repair in her right shoulder 10/01/2021 which continues to be problematic. She had a  a right breast lumpectomy and sentinel node biopsy on 10/05/2022 with 0/2 LN's.  She is pending radiation,  anti-estrogen therapy , and Herceptin. She has significant weakness and UT compensation in the right shoulder. Shoulder Abd in standing was very limited due to compensation, and shoulder IR and ER  were measured in supine due to this. Her incisions are still covered by steri strips, but appear to be healing well and without swelling. There is no sign of UE lymphedema, but she does have several axillary cords with at least 1 extending to the elbow. She was educated in NTS on the wall with wrist oscillation and could do with stretch and no shoulder pain. She is going to work on this at home several times per day, and if unimproved will come in to be checked next week. If necessary, she will be scheduled for further PT sessions at that time. Several foam pads in TG soft were made for her to wear in the lower border of her bra and at the right axillary area to help with discomfort.  Pt will benefit from skilled therapeutic intervention to improve on the following deficits: Decreased knowledge of precautions, impaired UE functional use, pain, decreased ROM, postural dysfunction.   PT treatment/interventions: ADL/Self care home management, Therapeutic exercises, Neuromuscular re-education, Patient/Family education, Self Care, scar mobilization, Manual therapy, and Re-evaluation   GOALS: Goals reviewed with patient? Yes  LONG TERM GOALS:  (STG=LTG)  GOALS Name Target Date  Goal  status  1 Pt will demonstrate she has regained full shoulder ROM and function post operatively compared to baselines.  Baseline: 10/25/2022 MET Except abduction limited by scapular compensation but [facility] passively  2 Pt will have decreased right medial arm sensitivity/discomfort due to cording 11/22/2022 INITIAL   3     4        PLAN:  PT FREQUENCY/DURATION: 1-2x/week x 4 weeks  PLAN FOR NEXT SESSION: How is discomfort from cording?, foam help bra ?, MFR techniques prn for right arm cording, Review NTS prn, schedule further appts prn.   [facility]  [address]  [place] [address]  [phone]  After Breast Cancer Class It is recommended you attend the ABC class to be educated on lymphedema risk reduction. This class is free of charge and lasts for 1 hour. It is a 1-time class. You will need to download the Webex app either on your phone or computer. We will send you a link the night before or the morning of the class. You should be able to click on that link to join the class. This is not a confidential class. You don't have to turn your camera on, but other participants may be able to see your email address.  Scar massage You can begin gentle scar massage to you incision sites. Gently place one hand on the incision and move the skin (without sliding on the skin) in various directions. Do this for a few minutes and then you can gently massage either coconut oil or vitamin E cream into the scars.  Compression garment You should continue wearing your compression bra until you feel like you no longer have swelling.  Home exercise Program Continue doing the exercises you were given until you feel like you can do them without feeling any tightness at the end.   Walking Program Studies show that 30 minutes of walking per day (fast enough to elevate your heart rate) can significantly reduce the risk of a cancer recurrence. If you can't walk due to other medical reasons, we encourage you to find another activity you could do (like a stationary bike or water exercise).  Posture After breast cancer surgery, people frequently sit with rounded shoulders posture because it puts their incisions on slack and feels better. If you sit like this and scar tissue forms in  that position, you can become very tight and have pain sitting or standing with good posture. Try to be aware of your posture and sit and stand up tall to heal properly.  Follow up PT: It is recommended you return every 3 months for the first 3 years following surgery to be assessed on the SOZO machine for an L-Dex score. This helps prevent clinically significant lymphedema in 95% of patients. These follow up screens are 10 minute appointments that you are not billed for.  Claris Pong, PT 10/25/2022, 2:07 PM

## 2022-10-25 ENCOUNTER — Ambulatory Visit: Payer: Medicare Other | Attending: General Surgery

## 2022-10-25 DIAGNOSIS — G8929 Other chronic pain: Secondary | ICD-10-CM | POA: Diagnosis present

## 2022-10-25 DIAGNOSIS — Z170 Estrogen receptor positive status [ER+]: Secondary | ICD-10-CM | POA: Insufficient documentation

## 2022-10-25 DIAGNOSIS — M25611 Stiffness of right shoulder, not elsewhere classified: Secondary | ICD-10-CM | POA: Insufficient documentation

## 2022-10-25 DIAGNOSIS — M25511 Pain in right shoulder: Secondary | ICD-10-CM | POA: Insufficient documentation

## 2022-10-25 DIAGNOSIS — C50411 Malignant neoplasm of upper-outer quadrant of right female breast: Secondary | ICD-10-CM | POA: Diagnosis present

## 2022-10-25 DIAGNOSIS — R293 Abnormal posture: Secondary | ICD-10-CM | POA: Diagnosis present

## 2022-10-25 NOTE — Patient Instructions (Signed)
     [facility]  [address], [address]  [place] [address]  [phone]  After Breast Cancer Class (Feb. 19, 12:00 won't show in my chart) It is recommended you attend the ABC class to be educated on lymphedema risk reduction. This class is free of charge and lasts for 1 hour. It is a 1-time class. You will need to download the Webex app either on your phone or computer. We will send you a link the night before or the morning of the class. You should be able to click on that link to join the class. This is not a confidential class. You don't have to turn your camera on, but other participants may be able to see your email address.  Scar massage You can begin gentle scar massage to you incision sites. Gently place one hand on the incision and move the skin (without sliding on the skin) in various directions. Do this for a few minutes and then you can gently massage either coconut oil or vitamin E cream into the scars.  Compression garment You should continue wearing your compression bra until you feel like you no longer have swelling.  Home exercise Program Continue doing the exercises you were given until you feel like you can do them without feeling any tightness at the end.   Walking Program Studies show that 30 minutes of walking per day (fast enough to elevate your heart rate) can significantly reduce the risk of a cancer recurrence. If you can't walk due to other medical reasons, we encourage you to find another activity you could do (like a stationary bike or water exercise).  Posture After breast cancer surgery, people frequently sit with rounded shoulders posture because it puts their incisions on slack and feels better. If you sit like this and scar tissue forms in that position, you can become very tight and have pain sitting or standing with good posture. Try to be aware of your posture and sit and stand up tall to heal properly.  Follow up PT: It is  recommended you return every 3 months for the first 2 years following surgery to be assessed on the SOZO machine for an L-Dex score. This helps prevent clinically significant lymphedema in 95% of patients. These follow up screens are 10 minute appointments that you are not billed for.

## 2022-10-26 ENCOUNTER — Other Ambulatory Visit: Payer: Self-pay

## 2022-10-27 ENCOUNTER — Other Ambulatory Visit (HOSPITAL_BASED_OUTPATIENT_CLINIC_OR_DEPARTMENT_OTHER): Payer: Self-pay

## 2022-10-29 ENCOUNTER — Other Ambulatory Visit (HOSPITAL_BASED_OUTPATIENT_CLINIC_OR_DEPARTMENT_OTHER): Payer: Self-pay

## 2022-10-29 ENCOUNTER — Telehealth: Payer: Self-pay | Admitting: *Deleted

## 2022-10-29 NOTE — Telephone Encounter (Signed)
spoke with her yesterday for awhile regarding her anxiety. She has a call into her psychiatrist regarding the medication she currently takes which includes Klonipin twice a day, hydroxizine up to 3 times a day and Xanax as needed. She states she has noticed some shortness of breath when she's walking but states she has gained weight also. I think she is just overwhelmed with everything and the start of chemo.  I talked her off the ledge and she felt better after our discussion. I told her I would let Dr. Chryl Heck know but it was doubtful that we would add anything along with those medications she is currently taking and would wait for the return call from her psychiatrist with recommendations.  She starts chemo 2/26.

## 2022-11-01 ENCOUNTER — Ambulatory Visit: Payer: Medicare Other

## 2022-11-01 DIAGNOSIS — M25611 Stiffness of right shoulder, not elsewhere classified: Secondary | ICD-10-CM

## 2022-11-01 DIAGNOSIS — C50411 Malignant neoplasm of upper-outer quadrant of right female breast: Secondary | ICD-10-CM | POA: Diagnosis not present

## 2022-11-01 DIAGNOSIS — Z170 Estrogen receptor positive status [ER+]: Secondary | ICD-10-CM

## 2022-11-01 DIAGNOSIS — G8929 Other chronic pain: Secondary | ICD-10-CM

## 2022-11-01 DIAGNOSIS — R293 Abnormal posture: Secondary | ICD-10-CM

## 2022-11-01 NOTE — Progress Notes (Signed)
Pharmacist Chemotherapy Monitoring - Initial Assessment    Anticipated start date: 11/08/22   The following has been reviewed per standard work regarding the patient's treatment regimen: The patient's diagnosis, treatment plan and drug doses, and organ/hematologic function Lab orders and baseline tests specific to treatment regimen  The treatment plan start date, drug sequencing, and pre-medications Prior authorization status  Patient's documented medication list, including drug-drug interaction screen and prescriptions for anti-emetics and supportive care specific to the treatment regimen The drug concentrations, fluid compatibility, administration routes, and timing of the medications to be used The patient's access for treatment and lifetime cumulative dose history, if applicable  The patient's medication allergies and previous infusion related reactions, if applicable   Changes made to treatment plan:  N/A  Follow up needed:  N/A   Judy Morrison, [place], 11/01/2022  3:23 PM

## 2022-11-01 NOTE — Therapy (Signed)
OUTPATIENT PHYSICAL THERAPY BREAST CANCER TREATMENT   Patient Name: Judy Morrison MRN: [id number] DOB:03-04-1951, 72 y.o., female Today's Date: 11/01/2022  END OF SESSION:  PT End of Session - 11/01/22 1003     Visit Number 3    Number of Visits 10    Date for PT Re-Evaluation 11/22/22    PT Start Time 1004    PT Stop Time 1100    PT Time Calculation (min) 56 min    Activity Tolerance Patient tolerated treatment well    Behavior During Therapy WFL for tasks assessed/performed             Past Medical History:  Diagnosis Date   Anemia    Anxiety    Arthritis    Depressed    Dermatitis    rare form, on prednisone   GERD (gastroesophageal reflux disease)    controlled with Omeprazole   Glaucoma    Torn rotator cuff    RIGHT    Urinary tract infection    dx 05-26-17 took 1 week cipro BID completed 06-02-17.   Past Surgical History:  Procedure Laterality Date   ADENOIDECTOMY     APPENDECTOMY     arthroscopic right knee      BREAST BIOPSY Right 09/01/2022   MM RT BREAST BX W LOC DEV 1ST LESION IMAGE BX SPEC STEREO GUIDE 09/01/2022 [facility]   BREAST BIOPSY  10/01/2022   MM RT RADIOACTIVE SEED LOC MAMMO GUIDE 10/01/2022 [facility]   BREAST LUMPECTOMY WITH RADIOACTIVE SEED AND SENTINEL LYMPH NODE BIOPSY Right 10/05/2022   Procedure: RIGHT BREAST LUMPECTOMY WITH RADIOACTIVE SEED AND AXILLARY SENTINEL LYMPH NODE BIOPSY;  Surgeon: Rolm Bookbinder, MD;  Location: [facility];  Service: General;  Laterality: Right;   CHOLECYSTECTOMY     COLONOSCOPY  06/06/2017   [place]   Pelvic sling     x2   POLYPECTOMY     PORTACATH PLACEMENT Left 10/05/2022   Procedure: INSERTION PORT-A-CATH;  Surgeon: Rolm Bookbinder, MD;  Location: [place];  Service: General;  Laterality: Left;   ROTATOR CUFF REPAIR Right    TONSILLECTOMY     TOTAL KNEE ARTHROPLASTY Right 08/12/2017   Procedure: RIGHT TOTAL KNEE ARTHROPLASTY;  Surgeon:  Sydnee Cabal, MD;  Location: [facility];  Service: Orthopedics;  Laterality: Right;   TOTAL KNEE ARTHROPLASTY Left 01/06/2018   Procedure: LEFT TOTAL KNEE ARTHROPLASTY;  Surgeon: Sydnee Cabal, MD;  Location: [facility];  Service: Orthopedics;  Laterality: Left;   UPPER GASTROINTESTINAL ENDOSCOPY     WISDOM TOOTH EXTRACTION     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   Malignant neoplasm of upper-outer quadrant of right breast in female, estrogen receptor positive ([place]) 09/10/2022   Rash and other nonspecific skin eruption 09/15/2020   Allergic contact dermatitis 09/15/2020   Pruritus 08/14/2020   Dizziness 05/10/2018   Atypical chest pain 05/10/2018   Primary osteoarthritis of left knee 01/06/2018   S/P knee replacement 08/12/2017   Incomplete tear of right rotator cuff 05/19/2017   Osteopenia 05/19/2017   Urge incontinence of urine 05/19/2017   Primary open angle glaucoma (POAG) 12/30/2016   Primary osteoarthritis of both knees 12/30/2016   Recurrent oral herpes simplex infection 12/30/2016   Tubular adenoma of colon 12/30/2016   Hypothyroidism 06/25/2016   Right foot pain 12/19/2014   Anxiety 05/21/2011   Seasonal allergies 05/21/2011     REFERRING  PROVIDER: Dr. Rolm Bookbinder  REFERRING DIAG: Right Breast Cancer  THERAPY DIAG:  Malignant neoplasm of upper-outer quadrant of right breast in female, estrogen receptor positive ([place])  Abnormal posture  Chronic right shoulder pain  Stiffness of right shoulder, not elsewhere classified  Rationale for Evaluation and Treatment: Rehabilitation  ONSET DATE: 08/02/2022-  SUBJECTIVE:                                                                                                                                                                                           SUBJECTIVE STATEMENT:  I took some of the steri strips off my breast last  night because they were coming off.  The axillary steri strips I helped off in the shower. There was no bleeding. The foam pad at the axilla did well. The others didn't seem to prevent rolling. My arm seems to be doing better. The wall slide did cause cause pain at the top of my shoulder, but I could feel the stretch under my arm. The sensitivity seems to be better. Exercises are not making the shoulder pain worse. I start chemo on 11/08/2024 for 12 Mondays in a row. PERTINENT HISTORY:  Patient was diagnosed on 08/02/2022 with right grade 2 invasive ductal carcinoma breast cancer. It measures 1.5 cm and is located in the upper outer quadrant. It is triple positive with a Ki67 of 5%. She had a rotator cuff repair in her right shoulder 10/01/2021 which continues to be problematic. She had a right lumpectomy with SLNB on 10/05/2022    PATIENT GOALS:  Reassess how my recovery is going related to arm function, pain, and swelling.  PAIN:  Are you having pain? Yes: NPRS scale: 2/10 Pain location: right shoulder Pain description: feels tight when I use shoulder Aggravating factors: reaching activities Relieving factors: ice?  PRECAUTIONS: Recent Surgery, right UE Lymphedema risk, Other: right RTC repair 1 year ago  ACTIVITY LEVEL / LEISURE: walking 30 min almost every day.   OBJECTIVE:   PATIENT SURVEYS:  QUICK DASH: 22.73  OBSERVATIONS: Several cords noted in axillary region with atleast 1 extending beyond antecubital fossa. Significant UT compensation due to weakness/prior RTC repair. Steri strips still present over axillary and breast incision, No visible swelling. Small area of mild fibrosis/scar tissue at axillary incision.  POSTURE:  Forward head, rounded shoulders  LYMPHEDEMA ASSESSMENT:    UPPER EXTREMITY AROM/PROM:   A/PROM RIGHT   eval   RIGHT 09/24/2022  Shoulder extension 37 40  Shoulder flexion 112 and painful 114  Shoulder abduction 140 and painful 60 significant UT  compensation/popping  Shoulder internal rotation 39 with scapular compensation 65  supine  Shoulder external rotation 67 with scapular compensation 95 supine                          (Blank rows = not tested)   A/PROM LEFT   eval  Shoulder extension 51  Shoulder flexion 151  Shoulder abduction 158  Shoulder internal rotation 59  Shoulder external rotation 74                          (Blank rows = not tested)   CERVICAL AROM: All within normal limits   UPPER EXTREMITY STRENGTH: WNL   LYMPHEDEMA ASSESSMENTS:    LANDMARK RIGHT   eval RIGHT 09/24/2022  10 cm proximal to olecranon process 26.8 27.3  Olecranon process 22.6 22.6  10 cm proximal to ulnar styloid process 20.2 20.0  Just proximal to ulnar styloid process 14.5 14.7  Across hand at thumb web space 17.5 18  At base of 2nd digit 5.9 5.6  (Blank rows = not tested)   LANDMARK LEFT   eval  10 cm proximal to olecranon process 27.6  Olecranon process 22.8  10 cm proximal to ulnar styloid process 19.8  Just proximal to ulnar styloid process 14.3  Across hand at thumb web space 18.3  At base of 2nd digit 5.7  (Blank rows = not tested)   Surgery type/Date: 10/05/2022 right Lumpectomy with SLNB Number of lymph nodes removed: 0/2 Current/past treatment (chemo, radiation, hormone therapy): pending radiation, anti estrogens and Herceptin Other symptoms:  Heaviness/tightness No Pain Yes Pitting edema No Infections No Decreased scar mobility Yes Stemmer sign No   TREATMENT TODAY 11/01/2022 Wall slides x 4 for abd with PT hand on UT to prevent compensation. Discontinued due to  significant popping in shoulder. Tried standing NTS but pt was not able to feel puling in axillary region. Soft tissue mobilization to right UT, pectorals and Lats with cocoa butter in supine Pt educated in scar massage to right breast incision to start gently, but will wait on axillary region due to scabs still present. PROM right shoulder with VC's  to pt to depress scapula and relax shoulder for flexion, scaption, abduction, ER Pts right shoulder in scaption with arm resting on a pillow for MFR to areas of cording in right axilla and extending into forearm.  Added elbow extension  and wrist oscillation while in this position to stretch cording.    PATIENT EDUCATION:  10/25/2022 Education details: Cording, NTS on wall, SOZO screens, ABC class, Made foam pads for lower border of bra to prevent rolling and right axillary region, scar massage Person educated: Patient Education method: Explanation, demonstation, handout Education comprehension: verbalized understanding  HOME EXERCISE PROGRAM: Reviewed previously given post op HEP. Tried abduction wall slide and emphasized scapular depression. Able to do but sometimes with compensation and not feeling a lot of stretch. Showed chest stretch with right hand behind her an wall and oscillating wrist to stretch cording. Gave illustrated picture  ASSESSMENT:  CLINICAL IMPRESSION: Pt is getting good benefit from foam pad placed in axillary region of bra, but no help with other pads to try and prevent rolling. Steri strips are off all incisions except portacath. She  was instructed in scar massage to breast incision but not axillary incision yet due to scabs present. Performed soft tissue massage  prior to PROM, and MFR techniques. Able to perform MFR techniques in axillary region with pts arm on  pilllow and appropriate stretch in axilla and arm without shoulder pain today. Pt will schedule another appt this week and next, but will wait and see how she is doing and may cancel if she does not feel she needs it.  Pt will benefit from skilled therapeutic intervention to improve on the following deficits: Decreased knowledge of precautions, impaired UE functional use, pain, decreased ROM, postural dysfunction.   PT treatment/interventions: ADL/Self care home management, Therapeutic exercises, Neuromuscular  re-education, Patient/Family education, Self Care, scar mobilization, Manual therapy, and Re-evaluation   GOALS: Goals reviewed with patient? Yes  LONG TERM GOALS:  (STG=LTG)  GOALS Name Target Date  Goal status  1 Pt will demonstrate she has regained full shoulder ROM and function post operatively compared to baselines.  Baseline: 10/25/2022 MET Except abduction limited by scapular compensation but [facility] passively  2 Pt will have decreased right medial arm sensitivity/discomfort due to cording 11/22/2022 INITIAL  3     4        PLAN:  PT FREQUENCY/DURATION: 1-2x/week x 4 weeks  PLAN FOR NEXT SESSION:  (right RTC repair  1 year ago and still with significant compensation)How is discomfort from cording?,  MFR techniques prn for right arm cording with arm on pillow, requires cueing to prevent compensation at UT, Review NTS prn, schedule further appts prn.   [facility]  [address], [address]  [place] [address]  [phone]  After Breast Cancer Class It is recommended you attend the ABC class to be educated on lymphedema risk reduction. This class is free of charge and lasts for 1 hour. It is a 1-time class. You will need to download the Webex app either on your phone or computer. We will send you a link the night before or the morning of the class. You should be able to click on that link to join the class. This is not a confidential class. You don't have to turn your camera on, but other participants may be able to see your email address.  Scar massage You can begin gentle scar massage to you incision sites. Gently place one hand on the incision and move the skin (without sliding on the skin) in various directions. Do this for a few minutes and then you can gently massage either coconut oil or vitamin E cream into the scars.  Compression garment You should continue wearing your compression bra until you feel like you no longer have swelling.  Home  exercise Program Continue doing the exercises you were given until you feel like you can do them without feeling any tightness at the end.   Walking Program Studies show that 30 minutes of walking per day (fast enough to elevate your heart rate) can significantly reduce the risk of a cancer recurrence. If you can't walk due to other medical reasons, we encourage you to find another activity you could do (like a stationary bike or water exercise).  Posture After breast cancer surgery, people frequently sit with rounded shoulders posture because it puts their incisions on slack and feels better. If you sit like this and scar tissue forms in that position, you can become very tight and have pain sitting or standing with good posture. Try to be aware of your posture and sit and stand up tall to heal properly.  Follow up PT: It is recommended you return every 3 months for the first 3 years following surgery to be assessed on the SOZO machine for an L-Dex score.  This helps prevent clinically significant lymphedema in 95% of patients. These follow up screens are 10 minute appointments that you are not billed for.  Claris Pong, PT 11/01/2022, 11:08 AM

## 2022-11-02 ENCOUNTER — Encounter (HOSPITAL_COMMUNITY): Payer: Self-pay

## 2022-11-04 ENCOUNTER — Ambulatory Visit: Payer: Medicare Other

## 2022-11-05 ENCOUNTER — Telehealth: Payer: Self-pay

## 2022-11-05 ENCOUNTER — Other Ambulatory Visit: Payer: Self-pay

## 2022-11-05 ENCOUNTER — Encounter: Payer: Self-pay | Admitting: *Deleted

## 2022-11-05 DIAGNOSIS — C50411 Malignant neoplasm of upper-outer quadrant of right female breast: Secondary | ICD-10-CM

## 2022-11-05 MED FILL — Dexamethasone Sodium Phosphate Inj 100 MG/10ML: INTRAMUSCULAR | Qty: 1 | Status: AC

## 2022-11-05 NOTE — Telephone Encounter (Signed)
S2205, ICE COMPRESS: RANDOMIZED TRIAL OF LIMB CRYOCOMPRESSION VERSUS CONTINUOUS COMPRESSION VERSUS LOW CYCLIC COMPRESSION FOR THE PREVENTION  OF TAXANE-INDUCED PERIPHERAL NEUROPATHY  Called patient and confirmed that she has no sores, wounds, or lesions to any extremity. Also confirmed she wishes to continue with study enrollment.  Marjie Skiff Daryana Whirley, RN, BSN, [facility] She  Her  Hers Clinical Research Nurse [facility] Direct Dial [phone]  Pager [phone] 11/05/2022 9:42 AM

## 2022-11-05 NOTE — Research (Signed)
S2205, ICE COMPRESS: RANDOMIZED TRIAL OF LIMB CRYOCOMPRESSION VERSUS CONTINUOUS COMPRESSION VERSUS LOW CYCLIC COMPRESSION FOR THE PREVENTION  OF TAXANE-INDUCED PERIPHERAL NEUROPATHY    This Nurse has reviewed this patient's inclusion and exclusion criteria as a second review and confirms [place] is eligible for study participation.  Patient may continue with enrollment.   Brion Aliment RN, BSN, CCRP Clinical Research Nurse Lead 11/05/2022 9:48 AM

## 2022-11-05 NOTE — Research (Signed)
S2205, ICE COMPRESS: RANDOMIZED TRIAL OF LIMB CRYOCOMPRESSION VERSUS CONTINUOUS COMPRESSION VERSUS LOW CYCLIC COMPRESSION FOR THE PREVENTION  OF TAXANE-INDUCED PERIPHERAL NEUROPATHY   This Nurse has reviewed this patient's inclusion and exclusion criteria and confirmed Judy Morrison is eligible for study participation.  Patient will continue with enrollment.  Menopausal status (women only): Judy Morrison is post-menopausal.  Eligibility confirmed by treating investigator, who also agrees that patient should proceed with enrollment.  Judy Skiff Ramsie Ostrander, RN, BSN, [facility] She  Her  Hers Clinical Research Nurse [place] [phone]  Pager [phone] 11/05/2022 9:43 AM

## 2022-11-08 ENCOUNTER — Other Ambulatory Visit: Payer: Self-pay

## 2022-11-08 ENCOUNTER — Encounter: Payer: Self-pay | Admitting: *Deleted

## 2022-11-08 ENCOUNTER — Inpatient Hospital Stay (HOSPITAL_BASED_OUTPATIENT_CLINIC_OR_DEPARTMENT_OTHER): Payer: Medicare Other | Admitting: Adult Health

## 2022-11-08 ENCOUNTER — Encounter: Payer: Self-pay | Admitting: Adult Health

## 2022-11-08 ENCOUNTER — Ambulatory Visit (HOSPITAL_COMMUNITY)
Admission: RE | Admit: 2022-11-08 | Discharge: 2022-11-08 | Disposition: A | Discharge status: Home / Self Care | Payer: Medicare Other | Source: Ambulatory Visit | Attending: Adult Health | Admitting: Adult Health

## 2022-11-08 ENCOUNTER — Inpatient Hospital Stay: Payer: Medicare Other

## 2022-11-08 ENCOUNTER — Other Ambulatory Visit: Payer: Medicare Other

## 2022-11-08 VITALS — BP 170/82 | HR 88 | Temp 97.9°F | Resp 18

## 2022-11-08 VITALS — BP 139/68 | HR 90 | Temp 97.9°F | Resp 16 | Ht 63.0 in | Wt 166.5 lb

## 2022-11-08 DIAGNOSIS — Z170 Estrogen receptor positive status [ER+]: Secondary | ICD-10-CM

## 2022-11-08 DIAGNOSIS — C50411 Malignant neoplasm of upper-outer quadrant of right female breast: Secondary | ICD-10-CM | POA: Diagnosis not present

## 2022-11-08 DIAGNOSIS — Z5111 Encounter for antineoplastic chemotherapy: Secondary | ICD-10-CM | POA: Diagnosis not present

## 2022-11-08 DIAGNOSIS — Z95828 Presence of other vascular implants and grafts: Secondary | ICD-10-CM

## 2022-11-08 DIAGNOSIS — R0609 Other forms of dyspnea: Secondary | ICD-10-CM | POA: Insufficient documentation

## 2022-11-08 LAB — CMP (CANCER CENTER ONLY)
ALT: 15 U/L (ref 0–44)
AST: 15 U/L (ref 15–41)
Albumin: 3.9 g/dL (ref 3.5–5.0)
Alkaline Phosphatase: 95 U/L (ref 38–126)
Anion gap: 6 (ref 5–15)
BUN: 15 mg/dL (ref 8–23)
CO2: 29 mmol/L (ref 22–32)
Calcium: 8.6 mg/dL — ABNORMAL LOW (ref 8.9–10.3)
Chloride: 102 mmol/L (ref 98–111)
Creatinine: 0.74 mg/dL (ref 0.44–1.00)
GFR, Estimated: >60 mL/min (ref >60)
Glucose, Bld: 75 mg/dL (ref 70–99)
Potassium: 4 mmol/L (ref 3.5–5.1)
Sodium: 137 mmol/L (ref 135–145)
Total Bilirubin: 0.3 mg/dL (ref 0.3–1.2)
Total Protein: 6.2 g/dL — ABNORMAL LOW (ref 6.5–8.1)

## 2022-11-08 LAB — CBC WITH DIFFERENTIAL (CANCER CENTER ONLY)
Abs Immature Granulocytes: 0.01 10*3/uL (ref 0.00–0.07)
Basophils Absolute: 0.1 10*3/uL (ref 0.0–0.1)
Basophils Relative: 1 %
Eosinophils Absolute: 0.5 10*3/uL (ref 0.0–0.5)
Eosinophils Relative: 6 %
HCT: 33 % — ABNORMAL LOW (ref 36.0–46.0)
Hemoglobin: 10.8 g/dL — ABNORMAL LOW (ref 12.0–15.0)
Immature Granulocytes: 0 %
Lymphocytes Relative: 18 %
Lymphs Abs: 1.3 10*3/uL (ref 0.7–4.0)
MCH: 26.4 pg (ref 26.0–34.0)
MCHC: 32.7 g/dL (ref 30.0–36.0)
MCV: 80.7 fL (ref 80.0–100.0)
Monocytes Absolute: 1 10*3/uL (ref 0.1–1.0)
Monocytes Relative: 13 %
Neutro Abs: 4.7 10*3/uL (ref 1.7–7.7)
Neutrophils Relative %: 62 %
Platelet Count: 271 10*3/uL (ref 150–400)
RBC: 4.09 MIL/uL (ref 3.87–5.11)
RDW: 14.6 % (ref 11.5–15.5)
WBC Count: 7.4 10*3/uL (ref 4.0–10.5)
nRBC: 0 % (ref 0.0–0.2)

## 2022-11-08 LAB — RESEARCH LABS

## 2022-11-08 MED ORDER — SODIUM CHLORIDE 0.9 % IV SOLN
80.0000 mg/m2 | Freq: Once | INTRAVENOUS | Status: AC
  Administered 2022-11-08: 144 mg via INTRAVENOUS
  Filled 2022-11-08: qty 24

## 2022-11-08 MED ORDER — SODIUM CHLORIDE 0.9 % IV SOLN
10.0000 mg | Freq: Once | INTRAVENOUS | Status: AC
  Administered 2022-11-08: 10 mg via INTRAVENOUS
  Filled 2022-11-08: qty 10

## 2022-11-08 MED ORDER — TRASTUZUMAB-DTTB CHEMO 150 MG IV SOLR
4.0000 mg/kg | Freq: Once | INTRAVENOUS | Status: AC
  Administered 2022-11-08: 300 mg via INTRAVENOUS
  Filled 2022-11-08: qty 14.29

## 2022-11-08 MED ORDER — HEPARIN SOD (PORK) LOCK FLUSH 100 UNIT/ML IV SOLN
500.0000 [IU] | Freq: Once | INTRAVENOUS | Status: AC | PRN
  Administered 2022-11-08: 500 [IU]

## 2022-11-08 MED ORDER — ACETAMINOPHEN 325 MG PO TABS
650.0000 mg | ORAL_TABLET | Freq: Once | ORAL | Status: AC
  Administered 2022-11-08: 650 mg via ORAL
  Filled 2022-11-08: qty 2

## 2022-11-08 MED ORDER — SODIUM CHLORIDE 0.9 % IV SOLN: Freq: Once | INTRAVENOUS | Status: AC

## 2022-11-08 MED ORDER — SODIUM CHLORIDE 0.9% FLUSH
10.0000 mL | INTRAVENOUS | Status: DC | PRN
  Administered 2022-11-08: 10 mL

## 2022-11-08 MED ORDER — DIPHENHYDRAMINE HCL 50 MG/ML IJ SOLN
50.0000 mg | Freq: Once | INTRAMUSCULAR | Status: AC
  Administered 2022-11-08: 50 mg via INTRAVENOUS
  Filled 2022-11-08: qty 1

## 2022-11-08 MED ORDER — FAMOTIDINE IN NACL 20-0.9 MG/50ML-% IV SOLN
20.0000 mg | Freq: Once | INTRAVENOUS | Status: AC
  Administered 2022-11-08: 20 mg via INTRAVENOUS
  Filled 2022-11-08: qty 50

## 2022-11-08 MED ORDER — SODIUM CHLORIDE 0.9% FLUSH: 10.0000 mL | INTRAVENOUS | Status: AC | PRN

## 2022-11-08 NOTE — Research (Unsigned)
DCP-001: Use of a Clinical Trial Screening Tool to Address Cancer Health Disparities in the [place] [facility])  This Nurse has reviewed this patient's inclusion and exclusion criteria and confirmed Judy Morrison is eligible for study participation.  Patient will continue with enrollment.  Eligibility confirmed by treating investigator, who also agrees that patient should proceed with enrollment.  Marjie Skiff Inaya Gillham, RN, BSN, [facility] She  Her  Hers Clinical Research Nurse [facility] Direct Dial [phone]  Pager [phone] 11/08/2022 1:27 PM

## 2022-11-08 NOTE — Patient Instructions (Addendum)
[facility]  Discharge Instructions: Thank you for choosing [facility] to provide your oncology and hematology care.   If you have a lab appointment with the [facility], please go directly to the [facility] and check in at the registration area.   Wear comfortable clothing and clothing appropriate for easy access to any Portacath or PICC line.   We strive to give you quality time with your provider. You may need to reschedule your appointment if you arrive late (15 or more minutes).  Arriving late affects you and other patients whose appointments are after yours.  Also, if you miss three or more appointments without notifying the office, you may be dismissed from the clinic at the provider's discretion.      For prescription refill requests, have your pharmacy contact our office and allow 72 hours for refills to be completed.    Today you received the following chemotherapy and/or immunotherapy agents: Ontruzant, Paclitaxel.   To help prevent nausea and vomiting after your treatment, we encourage you to take your nausea medication as directed.  BELOW ARE SYMPTOMS THAT SHOULD BE REPORTED IMMEDIATELY: *FEVER GREATER THAN 100.4 F (38 C) OR HIGHER *CHILLS OR SWEATING *NAUSEA AND VOMITING THAT IS NOT CONTROLLED WITH YOUR NAUSEA MEDICATION *UNUSUAL SHORTNESS OF BREATH *UNUSUAL BRUISING OR BLEEDING *URINARY PROBLEMS (pain or burning when urinating, or frequent urination) *BOWEL PROBLEMS (unusual diarrhea, constipation, pain near the anus) TENDERNESS IN MOUTH AND THROAT WITH OR WITHOUT PRESENCE OF ULCERS (sore throat, sores in mouth, or a toothache) UNUSUAL RASH, SWELLING OR PAIN  UNUSUAL VAGINAL DISCHARGE OR ITCHING   Items with * indicate a potential emergency and should be followed up as soon as possible or go to the Emergency Department if any problems should occur.  Please show the CHEMOTHERAPY ALERT CARD or IMMUNOTHERAPY ALERT  CARD at check-in to the Emergency Department and triage nurse.  Should you have questions after your visit or need to cancel or reschedule your appointment, please contact [facility]  Dept: [phone]  and follow the prompts.  Office hours are 8:00 a.m. to 4:30 p.m. Monday - Friday. Please note that voicemails left after 4:00 p.m. may not be returned until the following business day.  We are closed weekends and major holidays. You have access to a nurse at all times for urgent questions. Please call the main number to the clinic Dept: [phone] and follow the prompts.   For any non-urgent questions, you may also contact your provider using MyChart. We now offer e-Visits for anyone 39 and older to request care online for non-urgent symptoms. For details visit [email or web address].   Also download the MyChart app! Go to the app store, search "MyChart", open the app, select [facility], and log in with your MyChart username and password.  Trastuzumab Injection What is this medication? TRASTUZUMAB (tras TOO zoo mab) treats breast cancer and stomach cancer. It works by blocking a protein that causes cancer cells to grow and multiply. This helps to slow or stop the spread of cancer cells. This medicine may be used for other purposes; ask your health care provider or pharmacist if you have questions. COMMON BRAND NAME(S): Herceptin, Janae Bridgeman, Ontruzant, Trazimera What should I tell my care team before I take this medication? They need to know if you have any of these conditions: Heart failure Lung disease An unusual or allergic reaction to trastuzumab, other medications, foods, dyes,  or preservatives Pregnant or trying to get pregnant Breast-feeding How should I use this medication? This medication is injected into a vein. It is given by your care team in a hospital or clinic setting. Talk to your care team about the use of this  medication in children. It is not approved for use in children. Overdosage: If you think you have taken too much of this medicine contact a poison control center or emergency room at once. NOTE: This medicine is only for you. Do not share this medicine with others. What if I miss a dose? Keep appointments for follow-up doses. It is important not to miss your dose. Call your care team if you are unable to keep an appointment. What may interact with this medication? Certain types of chemotherapy, such as daunorubicin, doxorubicin, epirubicin, idarubicin This list may not describe all possible interactions. Give your health care provider a list of all the medicines, herbs, non-prescription drugs, or dietary supplements you use. Also tell them if you smoke, drink alcohol, or use illegal drugs. Some items may interact with your medicine. What should I watch for while using this medication? Your condition will be monitored carefully while you are receiving this medication. This medication may make you feel generally unwell. This is not uncommon, as chemotherapy affects healthy cells as well as cancer cells. Report any side effects. Continue your course of treatment even though you feel ill unless your care team tells you to stop. This medication may increase your risk of getting an infection. Call your care team for advice if you get a fever, chills, sore throat, or other symptoms of a cold or flu. Do not treat yourself. Try to avoid being around people who are sick. Avoid taking medications that contain aspirin, acetaminophen, ibuprofen, naproxen, or ketoprofen unless instructed by your care team. These medications can hide a fever. Talk to your care team if you may be pregnant. Serious birth defects can occur if you take this medication during pregnancy and for 7 months after the last dose. You will need a negative pregnancy test before starting this medication. Contraception is recommended while taking  this medication and for 7 months after the last dose. Your care team can help you find the option that works for you. Do not breastfeed while taking this medication and for 7 months after stopping treatment. What side effects may I notice from receiving this medication? Side effects that you should report to your care team as soon as possible: Allergic reactions or angioedema--skin rash, itching or hives, swelling of the face, eyes, lips, tongue, arms, or legs, trouble swallowing or breathing Dry cough, shortness of breath or trouble breathing Heart failure--shortness of breath, swelling of the ankles, feet, or hands, sudden weight gain, unusual weakness or fatigue Infection--fever, chills, cough, or sore throat Infusion reactions--chest pain, shortness of breath or trouble breathing, feeling faint or lightheaded Side effects that usually do not require medical attention (report to your care team if they continue or are bothersome): Diarrhea Dizziness Headache Nausea Trouble sleeping Vomiting This list may not describe all possible side effects. Call your doctor for medical advice about side effects. You may report side effects to FDA at 1-800-FDA-1088. Where should I keep my medication? This medication is given in a hospital or clinic. It will not be stored at home. NOTE: This sheet is a summary. It may not cover all possible information. If you have questions about this medicine, talk to your doctor, pharmacist, or health care provider.  2023 Elsevier/Gold Standard (2021-12-31 00:00:00) Paclitaxel Injection What is this medication? PACLITAXEL (PAK li TAX el) treats some types of cancer. It works by slowing down the growth of cancer cells. This medicine may be used for other purposes; ask your health care provider or pharmacist if you have questions. COMMON BRAND NAME(S): Onxol, Taxol What should I tell my care team before I take this medication? They need to know if you have any of  these conditions: Heart disease Liver disease Low white blood cell levels An unusual or allergic reaction to paclitaxel, other medications, foods, dyes, or preservatives If you or your partner are pregnant or trying to get pregnant Breast-feeding How should I use this medication? This medication is injected into a vein. It is given by your care team in a hospital or clinic setting. Talk to your care team about the use of this medication in children. While it may be given to children for selected conditions, precautions do apply. Overdosage: If you think you have taken too much of this medicine contact a poison control center or emergency room at once. NOTE: This medicine is only for you. Do not share this medicine with others. What if I miss a dose? Keep appointments for follow-up doses. It is important not to miss your dose. Call your care team if you are unable to keep an appointment. What may interact with this medication? Do not take this medication with any of the following: Live virus vaccines Other medications may affect the way this medication works. Talk with your care team about all of the medications you take. They may suggest changes to your treatment plan to lower the risk of side effects and to make sure your medications work as intended. This list may not describe all possible interactions. Give your health care provider a list of all the medicines, herbs, non-prescription drugs, or dietary supplements you use. Also tell them if you smoke, drink alcohol, or use illegal drugs. Some items may interact with your medicine. What should I watch for while using this medication? Your condition will be monitored carefully while you are receiving this medication. You may need blood work while taking this medication. This medication may make you feel generally unwell. This is not uncommon as chemotherapy can affect healthy cells as well as cancer cells. Report any side effects. Continue your  course of treatment even though you feel ill unless your care team tells you to stop. This medication can cause serious allergic reactions. To reduce the risk, your care team may give you other medications to take before receiving this one. Be sure to follow the directions from your care team. This medication may increase your risk of getting an infection. Call your care team for advice if you get a fever, chills, sore throat, or other symptoms of a cold or flu. Do not treat yourself. Try to avoid being around people who are sick. This medication may increase your risk to bruise or bleed. Call your care team if you notice any unusual bleeding. Be careful brushing or flossing your teeth or using a toothpick because you may get an infection or bleed more easily. If you have any dental work done, tell your dentist you are receiving this medication. Talk to your care team if you may be pregnant. Serious birth defects can occur if you take this medication during pregnancy. Talk to your care team before breastfeeding. Changes to your treatment plan may be needed. What side effects may I notice from receiving  this medication? Side effects that you should report to your care team as soon as possible: Allergic reactions--skin rash, itching, hives, swelling of the face, lips, tongue, or throat Heart rhythm changes--fast or irregular heartbeat, dizziness, feeling faint or lightheaded, chest pain, trouble breathing Increase in blood pressure Infection--fever, chills, cough, sore throat, wounds that don't heal, pain or trouble when passing urine, general feeling of discomfort or being unwell Low blood pressure--dizziness, feeling faint or lightheaded, blurry vision Low red blood cell level--unusual weakness or fatigue, dizziness, headache, trouble breathing Painful swelling, warmth, or redness of the skin, blisters or sores at the infusion site Pain, tingling, or numbness in the hands or feet Slow  heartbeat--dizziness, feeling faint or lightheaded, confusion, trouble breathing, unusual weakness or fatigue Unusual bruising or bleeding Side effects that usually do not require medical attention (report to your care team if they continue or are bothersome): Diarrhea Hair loss Joint pain Loss of appetite Muscle pain Nausea Vomiting This list may not describe all possible side effects. Call your doctor for medical advice about side effects. You may report side effects to FDA at 1-800-FDA-1088. Where should I keep my medication? This medication is given in a hospital or clinic. It will not be stored at home. NOTE: This sheet is a summary. It may not cover all possible information. If you have questions about this medicine, talk to your doctor, pharmacist, or health care provider.  2023 Elsevier/Gold Standard (2022-01-14 00:00:00)

## 2022-11-08 NOTE — Research (Unsigned)
TRIAL S2205, ICE COMPRESS: RANDOMIZED TRIAL OF LIMB CRYOCOMPRESSION VERSUS CONTINUOUS COMPRESSION VERSUS LOW CYCLIC COMPRESSION FOR THE PREVENTION  OF TAXANE-INDUCED PERIPHERAL NEUROPATHY   Patient arrives today Accompanied by her daughter  for the Week 1/cycle 1 day 1 visit. Confirmed patient has no lesions, sores, or wounds to extremities. No change in medical history.   PROs: Per study protocol, all PROs required for this visit were completed prior to other study activities and completeness has been verified.  These were done at her consent visit.   LABS: Optional labs are collected per consent and study protocol: Patient Kerline N Shurley tolerated well without complaint.    MEDICATION REVIEW: Patient reviews and verifies the current medication list is correct.  MD/PROVIDER VISIT: Patient sees Wilber Bihari, NP for today's visit. This is not a study required visit.   ADVERSE EVENTS: Patient Aleighia Gera Sava reports no adverse events, as intervention is being started at this visit.  EKG ASSESSMENT: EKG assessment is not required for this visit.  NEURO ASSESSMENT: The neuro assessment was completed by this nurse on the day of consent.   GIFT CARD: This study does not provide visit compensation.   Patient is Arm 2: Continuous compression  1401: Wraps applied, pre-treatment started 1412: Tolerability check; pt sleeping 1420: Tolerability check: pt sleeping 1438: Left arm wrap partially removed for VS monitoring 1443: Taxane started; started study treatment 1450: Tolerability check; feels fine 1500: Upper extremity device restarted; pt reports tingling BUE; repositioned wraps; reports feels like her arms are asleep. Left arm was only partially wrapped for VS; unwrapped it so it could be accessed better 1505: Report to Doristine Johns and Donell Sievert. Discussed that I am out of office at patient's next treatment but will be back with her on 11/23/22. She verbalized  understanding.  Marjie Skiff Georjean Toya, RN, BSN, [facility]  [facility] Clinical Research Nurse [facility] Direct Dial [phone]  Pager [phone] 11/09/2022 9:04 AM

## 2022-11-08 NOTE — Research (Signed)
S2205, ICE COMPRESS: RANDOMIZED TRIAL OF LIMB CRYOCOMPRESSION VERSUS CONTINUOUS COMPRESSION VERSUS LOW CYCLIC COMPRESSION FOR THE PREVENTION  OF TAXANE-INDUCED PERIPHERAL NEUROPATHY   This nurse went to the infusion room and completed this pt's study intervention after the pt's nurse, Melanie Henslee, left at 3 pm.   ARM 2 - Continuous Compression   3:08 pm - Tolerability check, and the pt's left arm was re-wrapped.   3:25 pm - Tolerability check completed- no complaints by pt  3:38 pm - Tolerability check completed - no complaints by pt  3:54 pm - removed wraps to allow pt to use the bathroom 4:02 pm - re-applied wraps after bathroom break/Tolerability check completed- no complaints by pt 4:13 pm - Taxane infusion completed 4:21 pm - post-infusion started 4:51 pm - post-infusion completed, wraps removed.  Pt denied any adverse events.  Pt's skin was assessed by Foye Spurling, RN.    The pt was thanked for her support of this clinical trial.  The pt had no questions or concerns for the research staff.   Brion Aliment RN, BSN, CCRP Clinical Research Nurse Lead 11/08/2022 5:01 PM

## 2022-11-08 NOTE — Progress Notes (Signed)
[facility] Follow up:    Judy Morrison, [place] [address]   DIAGNOSIS:  Cancer Staging  Malignant neoplasm of upper-outer quadrant of right breast in female, estrogen receptor positive ([place]) Staging form: Breast, AJCC 8th Edition - Clinical stage from 09/15/2022: Stage IA (cT1c, cN0, cM0, G2, ER+, PR+, HER2+) - Unsigned Stage prefix: Initial diagnosis Histologic grading system: 3 grade system Laterality: Right Staged by: Pathologist and managing physician Stage used in treatment planning: Yes National guidelines used in treatment planning: Yes Type of national guideline used in treatment planning: NCCN - Pathologic stage from 10/05/2022: Stage IA (pT1c, pN0, cM0, G2, ER+, PR+, HER2+) - Signed by Gardenia Phlegm, NP on 11/08/2022 Stage prefix: Initial diagnosis Histologic grading system: 3 grade system   SUMMARY OF ONCOLOGIC HISTORY: Oncology History  Malignant neoplasm of upper-outer quadrant of right breast in female, estrogen receptor positive ([place])  08/02/2022 Mammogram   In the right breast, possible distortion warrants further evaluation. In the left breast, no findings suspicious for malignancy. Diagnostic mammogram showed persistent subtle distortion central slightly LATERAL aspect of the RIGHT breast warranting tissue diagnosis.     09/01/2022 Pathology Results   Final pathology showed grade 2 invasive mammary carcinoma, prognostic showed ER 90% positive strong staining PR 90% positive strong staining, Ki-67 of 5% and HER2 3+ by [facility]   09/10/2022 Initial Diagnosis   Malignant neoplasm of upper-outer quadrant of right breast in female, estrogen receptor positive ([place])    Genetic Testing   [facility] CancerNext-Expanded Panel+RNA was Positive. 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 Report date is  10/04/2022.  The CancerNext-Expanded gene panel offered by [facility] and includes sequencing, rearrangement, and RNA analysis for the following 77 genes: AIP, ALK, APC, ATM, AXIN2, BAP1, BARD1, BLM, BMPR1A, BRCA1, BRCA2, BRIP1, CDC73, CDH1, CDK4, CDKN1B, CDKN2A, CHEK2, CTNNA1, DICER1, FANCC, FH, FLCN, GALNT12, KIF1B, LZTR1, MAX, MEN1, MET, MLH1, MSH2, MSH3, MSH6, MUTYH, NBN, NF1, NF2, NTHL1, PALB2, PHOX2B, PMS2, POT1, PRKAR1A, PTCH1, PTEN, RAD51C, RAD51D, RB1, RECQL, RET, SDHA, SDHAF2, SDHB, SDHC, SDHD, SMAD4, SMARCA4, SMARCB1, SMARCE1, STK11, SUFU, TMEM127, TP53, TSC1, TSC2, VHL and XRCC2 (sequencing and deletion/duplication); EGFR, EGLN1, HOXB13, KIT, MITF, PDGFRA, POLD1, and POLE (sequencing only); EPCAM and GREM1 (deletion/duplication only).    10/05/2022 Surgery   Right breast lumpectomy with Dr. 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  Grade 2, 2 sentinel lymph nodes negative for metastasis.  Margins negative.  T1c, N0.   10/05/2022 Cancer Staging   Staging form: Breast, AJCC 8th Edition - Pathologic stage from 10/05/2022: Stage IA (pT1c, pN0, cM0, G2, ER+, PR+, HER2+) - Signed by Gardenia Phlegm, NP on 11/08/2022 Stage prefix: Initial diagnosis Histologic grading system: 3 grade system   11/08/2022 -  Chemotherapy   Patient is on Treatment Plan : BREAST Paclitaxel + Trastuzumab q7d / Trastuzumab q21d       CURRENT THERAPY: Taxol/Herceptin  INTERVAL HISTORY: Judy Morrison 72 y.o. female returns for follow-up prior to beginning adjuvant chemotherapy with Taxol and Herceptin. She is accompanied by her daughter Judy Morrison.  Her children have noticed shortness of breath that has been going on even since prior to her diagnosis.  She has increased general anxiety and she takes klonipin regularly, hydroxyzine (for itching and anxiety).  She is walking about 30 minutes per day and until the fall she was able to  walk and talk at the same time.  Since her  diagnosis, when she is walking she becomes more breathless.  Her daughter who is with her notes that it happens when walking different places in the house and getting out of breath, such as going to the bathroom and coming back and being breathless.  This is worsening over the past 12 weeks.    She is a former smoker of about 20 years and quit in 1986.  She smoked about 1ppd.  Her most recent lipid panel occurred in 12/2021 with LDL 76, TC of 170, HDL 79. No history of HTN, Diabetes.  FH of dad with MI in his early 98s.  She is taking simvastatin.  She denies any chest pain or palpitations.  Her most recent echocardiogram occurred on October 18, 2022 demonstrating a left ventricular ejection fraction of 60 to 65% and a normal global longitudinal strain.  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   Malignant neoplasm of upper-outer quadrant of right breast in female, estrogen receptor positive ([place]) 09/10/2022   Rash and other nonspecific skin eruption 09/15/2020   Allergic contact dermatitis 09/15/2020   Pruritus 08/14/2020   Dizziness 05/10/2018   Atypical chest pain 05/10/2018   Primary osteoarthritis of left knee 01/06/2018   S/P knee replacement 08/12/2017   Incomplete tear of right rotator cuff 05/19/2017   Osteopenia 05/19/2017   Urge incontinence of urine 05/19/2017   Primary open angle glaucoma (POAG) 12/30/2016   Primary osteoarthritis of both knees 12/30/2016   Recurrent oral herpes simplex infection 12/30/2016   Tubular adenoma of colon 12/30/2016   Hypothyroidism 06/25/2016   Right foot pain 12/19/2014   Anxiety 05/21/2011   Seasonal allergies 05/21/2011    is allergic to penicillin g, cephalexin, codeine, erythromycin, and penicillins.  MEDICAL HISTORY: Past Medical History:  Diagnosis Date   Anemia    Anxiety    Arthritis    Depressed    Dermatitis    rare form, on prednisone    GERD (gastroesophageal reflux disease)    controlled with Omeprazole   Glaucoma    Torn rotator cuff    RIGHT    Urinary tract infection    dx 05-26-17 took 1 week cipro BID completed 06-02-17.    SURGICAL HISTORY: Past Surgical History:  Procedure Laterality Date   ADENOIDECTOMY     APPENDECTOMY     arthroscopic right knee      BREAST BIOPSY Right 09/01/2022   MM RT BREAST BX W LOC DEV 1ST LESION IMAGE BX SPEC STEREO GUIDE 09/01/2022 [facility]   BREAST BIOPSY  10/01/2022   MM RT RADIOACTIVE SEED LOC MAMMO GUIDE 10/01/2022 [facility]   BREAST LUMPECTOMY WITH RADIOACTIVE SEED AND SENTINEL LYMPH NODE BIOPSY Right 10/05/2022   Procedure: RIGHT BREAST LUMPECTOMY WITH RADIOACTIVE SEED AND AXILLARY SENTINEL LYMPH NODE BIOPSY;  Surgeon: Rolm Bookbinder, MD;  Location: [facility];  Service: General;  Laterality: Right;   CHOLECYSTECTOMY     COLONOSCOPY  06/06/2017   [place]   Pelvic sling     x2   POLYPECTOMY     PORTACATH PLACEMENT Left 10/05/2022   Procedure: INSERTION PORT-A-CATH;  Surgeon: Rolm Bookbinder, MD;  Location: [facility];  Service: General;  Laterality: Left;   ROTATOR CUFF REPAIR Right    TONSILLECTOMY     TOTAL KNEE ARTHROPLASTY Right 08/12/2017   Procedure: RIGHT TOTAL KNEE  ARTHROPLASTY;  Surgeon: Sydnee Cabal, MD;  Location: [facility];  Service: Orthopedics;  Laterality: Right;   TOTAL KNEE ARTHROPLASTY Left 01/06/2018   Procedure: LEFT TOTAL KNEE ARTHROPLASTY;  Surgeon: Sydnee Cabal, MD;  Location: [facility];  Service: Orthopedics;  Laterality: Left;   UPPER GASTROINTESTINAL ENDOSCOPY     WISDOM TOOTH EXTRACTION      SOCIAL HISTORY: Social History   Socioeconomic History   Marital status: Single    Spouse name: Not on file   Number of children: Not on file   Years of education: Not on file   Highest education level: Not on file  Occupational History   Not on file  Tobacco Use   Smoking status: Former     Types: Cigarettes    Quit date: 28    Years since quitting: 38.1   Smokeless tobacco: Never  Vaping Use   Vaping Use: Never used  Substance and Sexual Activity   Alcohol use: Yes    Alcohol/week: 2.0 standard drinks of alcohol    Types: 2 Cans of beer per week   Drug use: No   Sexual activity: Not on file  Other Topics Concern   Not on file  Social History Narrative   Not on file   Social Determinants of Health   Financial Resource Strain: Not on file  Food Insecurity: No Food Insecurity (09/15/2022)   Hunger Vital Sign    Worried About Running Out of Food in the Last Year: Never true    Ran Out of Food in the Last Year: Never true  Transportation Needs: No Transportation Needs (09/15/2022)   PRAPARE - Hydrologist (Medical): No    Lack of Transportation (Non-Medical): No  Physical Activity: Not on file  Stress: Not on file  Social Connections: Not on file  Intimate Partner Violence: Not on file    FAMILY HISTORY: Family History  Problem Relation Age of Onset   Bowel Disease Mother        ishemic bowel   Lung cancer Mother        smoked   Cervical cancer Paternal Aunt    Cancer Maternal Grandfather        unknown type   Lung cancer Paternal Grandfather        smoked   Colon cancer Neg Hx    Breast cancer Neg Hx    Esophageal cancer Neg Hx    Pancreatic cancer Neg Hx    Prostate cancer Neg Hx    Rectal cancer Neg Hx    Stomach cancer Neg Hx    Allergic rhinitis Neg Hx    Asthma Neg Hx    Eczema Neg Hx    Urticaria Neg Hx    Colon polyps Neg Hx     Review of Systems  Constitutional:  Negative for appetite change, chills, fatigue, fever and unexpected weight change.  HENT:   Negative for hearing loss, lump/mass and trouble swallowing.   Eyes:  Negative for eye problems and icterus.  Respiratory:  Positive for shortness of breath. Negative for chest tightness and cough.   Cardiovascular:  Negative for chest pain, leg swelling and  palpitations.  Gastrointestinal:  Negative for abdominal distention, abdominal pain, constipation, diarrhea, nausea and vomiting.  Endocrine: Negative for hot flashes.  Genitourinary:  Negative for difficulty urinating.   Musculoskeletal:  Negative for arthralgias.  Skin:  Negative for itching and rash.  Neurological:  Negative for dizziness, extremity weakness, headaches and numbness.  Hematological:  Negative for adenopathy. Does not bruise/bleed easily.  Psychiatric/Behavioral:  Negative for depression. The patient is nervous/anxious.       PHYSICAL EXAMINATION  ECOG PERFORMANCE STATUS: 1 - Symptomatic but completely ambulatory  Vitals:   11/08/22 1052  BP: 139/68  Pulse: 90  Resp: 16  Temp: 97.9 F (36.6 C)  SpO2: 99%    Physical Exam Constitutional:      General: She is not in acute distress.    Appearance: Normal appearance. She is not toxic-appearing.  HENT:     Head: Normocephalic and atraumatic.  Eyes:     General: No scleral icterus. Cardiovascular:     Rate and Rhythm: Normal rate and regular rhythm.     Pulses: Normal pulses.     Heart sounds: Normal heart sounds.  Pulmonary:     Effort: Pulmonary effort is normal.     Breath sounds: Normal breath sounds.  Abdominal:     General: Abdomen is flat. Bowel sounds are normal. There is no distension.     Palpations: Abdomen is soft.     Tenderness: There is no abdominal tenderness.  Musculoskeletal:        General: No swelling.     Cervical back: Neck supple.  Lymphadenopathy:     Cervical: No cervical adenopathy.  Skin:    General: Skin is warm and dry.     Findings: No rash.  Neurological:     General: No focal deficit present.     Mental Status: She is alert.  Psychiatric:        Mood and Affect: Mood normal.        Behavior: Behavior normal.     LABORATORY DATA:  CBC    Component Value Date/Time   WBC 7.4 11/08/2022 0958   WBC 10.1 10/01/2021 1314   RBC 4.09 11/08/2022 0958   HGB 10.8  (L) 11/08/2022 0958   HGB 13.7 09/15/2020 1209   HCT 33.0 (L) 11/08/2022 0958   HCT 41.3 09/15/2020 1209   PLT 271 11/08/2022 0958   PLT 349 09/15/2020 1209   MCV 80.7 11/08/2022 0958   MCV 86 09/15/2020 1209   MCH 26.4 11/08/2022 0958   MCHC 32.7 11/08/2022 0958   RDW 14.6 11/08/2022 0958   RDW 12.3 09/15/2020 1209   LYMPHSABS 1.3 11/08/2022 0958   LYMPHSABS 1.2 09/15/2020 1209   MONOABS 1.0 11/08/2022 0958   EOSABS 0.5 11/08/2022 0958   EOSABS 0.2 09/15/2020 1209   BASOSABS 0.1 11/08/2022 0958   BASOSABS 0.1 09/15/2020 1209    CMP     Component Value Date/Time   NA 137 11/08/2022 0958   NA 135 09/15/2020 1209   K 4.0 11/08/2022 0958   CL 102 11/08/2022 0958   CO2 29 11/08/2022 0958   GLUCOSE 75 11/08/2022 0958   BUN 15 11/08/2022 0958   BUN 14 09/15/2020 1209   CREATININE 0.74 11/08/2022 0958   CALCIUM 8.6 (L) 11/08/2022 0958   PROT 6.2 (L) 11/08/2022 0958   PROT 6.8 09/15/2020 1209   ALBUMIN 3.9 11/08/2022 0958   ALBUMIN 4.6 09/15/2020 1209   AST 15 11/08/2022 0958   ALT 15 11/08/2022 0958   ALKPHOS 95 11/08/2022 0958   BILITOT 0.3 11/08/2022 0958   GFRNONAA >60 11/08/2022 0958   GFRAA 74 09/15/2020 1209     ASSESSMENT and THERAPY PLAN:   Malignant neoplasm of upper-outer quadrant of right breast in female, estrogen receptor positive ([place]) Judy Morrison is a 72 year old woman with history of  stage Ia triple positive breast cancer diagnosed in January 2024 status postlumpectomy and here today to begin adjuvant chemotherapy and immunotherapy with paclitaxel and trastuzumab.  She will receive paclitaxel weekly along with trastuzumab weekly for 12 weeks.  After that she will go on to receive maintenance trastuzumab every 3 weeks to complete 1 year of anti-HER2 therapy.  They are stable and she will proceed with chemotherapy and Herceptin today.  We reviewed the issue of shortness of breath and I placed orders for a chest x-ray.  I will place a referral to cardiology  for follow-up and evaluation and if that is negative we will refer to pulmonology.  We will see Judy Morrison back in 1 week for labs follow-up and her next treatment.  She has her antinausea medicine and confirm she knows how to use it.    All questions were answered. The patient knows to call the clinic with any problems, questions or concerns. We can certainly see the patient much sooner if necessary.  Total encounter time:30 minutes*in face-to-face visit time, chart review, lab review, care coordination, order entry, and documentation of the encounter time.    Wilber Bihari, NP 11/08/22 11:29 AM Medical Oncology and Hematology [facility] [place], [place] [address] Tel. [phone]    Fax. [phone]  *Total Encounter Time as defined by the Centers for Medicare and Medicaid Services includes, in addition to the face-to-face time of a patient visit (documented in the note above) non-face-to-face time: obtaining and reviewing outside history, ordering and reviewing medications, tests or procedures, care coordination (communications with other health care professionals or caregivers) and documentation in the medical record.

## 2022-11-08 NOTE — Research (Unsigned)
Trial: [id number]: Use of a Clinical Trial Screening Tool to Address Cancer Health Disparities in the [facility] [place])  Patient Judy Morrison was identified by this RN as a potential candidate for the above listed study.  This Clinical Research Nurse met with Judy Morrison, [id number], on 11/08/22 in a manner and location that ensures patient privacy to discuss participation in the above listed research study.  Patient is Accompanied by her daughter .  A copy of the informed consent document and separate HIPAA Authorization was provided to the patient.  Patient reads, speaks, and understands [place].    Patient was provided with the business card of this Nurse and encouraged to contact the research team with any questions.  Patient was provided the option of taking informed consent documents home to review and was encouraged to review at their convenience with their support network, including other care providers. Patient is comfortable with making a decision regarding study participation today.  As outlined in the informed consent form, this Nurse and Judy Morrison discussed the purpose of the research study, the investigational nature of the study, study procedures and requirements for study participation, potential risks and benefits of study participation, as well as alternatives to participation. This study is not blinded. The patient understands participation is voluntary and they may withdraw from study participation at any time.  This study does not involve randomization.  This study does not involve an investigational drug or device. This study does not involve a placebo. Patient understands enrollment is pending full eligibility review.   Confidentiality and how the patient's information will be used as part of study participation were discussed.  Patient was informed there is not reimbursement provided for their time and effort spent on trial  participation.  The patient is encouraged to discuss research study participation with their insurance provider to determine what costs they may incur as part of study participation, including research related injury.    All questions were answered to patient's satisfaction.  The informed consent and separate HIPAA Authorization was reviewed page by page.  The patient's mental and emotional status is appropriate to provide informed consent, and the patient verbalizes an understanding of study participation.  Patient has agreed to participate in the above listed research study and has voluntarily signed the informed consent version dated 10/12/21 and separate HIPAA Authorization, version 15  on 11/08/22 at 1308PM.  The patient was provided with a copy of the signed informed consent form and separate HIPAA Authorization for their reference.  No study specific procedures were obtained prior to the signing of the informed consent document.  Approximately 10 minutes were spent with the patient reviewing the informed consent documents.  Patient was not requested to complete a Release of Information form.  Study data was collected in a way that ensures privacy.  Marjie Skiff Elky Funches, RN, BSN, [facility]  Her  Hers Clinical Research Nurse [facility] Direct Dial [phone]  Pager [phone] 11/08/2022 1:26 PM

## 2022-11-08 NOTE — Assessment & Plan Note (Addendum)
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  She will receive paclitaxel weekly along with trastuzumab weekly for 12 weeks.  After that she will go on to receive maintenance trastuzumab every 3 weeks to complete 1 year of anti-HER2 therapy.  They are stable and she will proceed with chemotherapy and Herceptin today.  We reviewed the issue of shortness of breath and I placed orders for a chest x-ray.  I will place a referral to cardiology for follow-up and evaluation and if that is negative we will refer to pulmonology.  We will see [name] back in 1 week for labs follow-up and her next treatment.  She has her antinausea medicine and confirm she knows how to use it.

## 2022-11-09 ENCOUNTER — Telehealth: Payer: Self-pay | Admitting: *Deleted

## 2022-11-09 ENCOUNTER — Encounter: Payer: Self-pay | Admitting: Hematology and Oncology

## 2022-11-09 NOTE — Telephone Encounter (Signed)
-----   Message from Rafael Bihari, RN sent at 11/08/2022  4:25 PM EST ----- Regarding: Dr Chryl Heck Pt, first time Ontuzant and Paclitaxel Dr Chryl Heck pt came in 2/26 for first time Ontruzant and Paclitaxel. Tolerated infusions well. Needs call back.

## 2022-11-09 NOTE — Telephone Encounter (Signed)
Called pt to see how she did with her recent treatment & she reports doing well & denies any problem.  She knows how to reach [place] & knows her next appt.

## 2022-11-12 ENCOUNTER — Ambulatory Visit: Payer: Medicare Other | Attending: General Surgery

## 2022-11-12 DIAGNOSIS — Z170 Estrogen receptor positive status [ER+]: Secondary | ICD-10-CM | POA: Insufficient documentation

## 2022-11-12 DIAGNOSIS — R293 Abnormal posture: Secondary | ICD-10-CM | POA: Insufficient documentation

## 2022-11-12 DIAGNOSIS — M25611 Stiffness of right shoulder, not elsewhere classified: Secondary | ICD-10-CM | POA: Insufficient documentation

## 2022-11-12 DIAGNOSIS — G8929 Other chronic pain: Secondary | ICD-10-CM | POA: Insufficient documentation

## 2022-11-12 DIAGNOSIS — M25511 Pain in right shoulder: Secondary | ICD-10-CM | POA: Diagnosis present

## 2022-11-12 DIAGNOSIS — C50411 Malignant neoplasm of upper-outer quadrant of right female breast: Secondary | ICD-10-CM | POA: Diagnosis present

## 2022-11-12 MED FILL — Dexamethasone Sodium Phosphate Inj 100 MG/10ML: INTRAMUSCULAR | Qty: 1 | Status: AC

## 2022-11-12 NOTE — Therapy (Signed)
OUTPATIENT PHYSICAL THERAPY BREAST CANCER TREATMENT   Patient Name: Judy Morrison MRN: [id number] DOB:05-Aug-1951, 72 y.o., female Today's Date: 11/12/2022  END OF SESSION:  PT End of Session - 11/12/22 1004     Visit Number 4    Number of Visits 10    Date for PT Re-Evaluation 11/22/22    PT Start Time 1005    Activity Tolerance Patient tolerated treatment well    Behavior During Therapy [facility] for tasks assessed/performed             Past Medical History:  Diagnosis Date   Anemia    Anxiety    Arthritis    Depressed    Dermatitis    rare form, on prednisone   GERD (gastroesophageal reflux disease)    controlled with Omeprazole   Glaucoma    Torn rotator cuff    RIGHT    Urinary tract infection    dx 05-26-17 took 1 week cipro BID completed 06-02-17.   Past Surgical History:  Procedure Laterality Date   ADENOIDECTOMY     APPENDECTOMY     arthroscopic right knee      BREAST BIOPSY Right 09/01/2022   MM RT BREAST BX W LOC DEV 1ST LESION IMAGE BX SPEC STEREO GUIDE 09/01/2022 [facility]   BREAST BIOPSY  10/01/2022   MM RT RADIOACTIVE SEED LOC MAMMO GUIDE 10/01/2022 [facility]   BREAST LUMPECTOMY WITH RADIOACTIVE SEED AND SENTINEL LYMPH NODE BIOPSY Right 10/05/2022   Procedure: RIGHT BREAST LUMPECTOMY WITH RADIOACTIVE SEED AND AXILLARY SENTINEL LYMPH NODE BIOPSY;  Surgeon: Rolm Bookbinder, MD;  Location: [place];  Service: General;  Laterality: Right;   CHOLECYSTECTOMY     COLONOSCOPY  06/06/2017   [place]   Pelvic sling     x2   POLYPECTOMY     PORTACATH PLACEMENT Left 10/05/2022   Procedure: INSERTION PORT-A-CATH;  Surgeon: Rolm Bookbinder, MD;  Location: [place];  Service: General;  Laterality: Left;   ROTATOR CUFF REPAIR Right    TONSILLECTOMY     TOTAL KNEE ARTHROPLASTY Right 08/12/2017   Procedure: RIGHT TOTAL KNEE ARTHROPLASTY;  Surgeon: Sydnee Cabal, MD;  Location: [facility];  Service: Orthopedics;   Laterality: Right;   TOTAL KNEE ARTHROPLASTY Left 01/06/2018   Procedure: LEFT TOTAL KNEE ARTHROPLASTY;  Surgeon: Sydnee Cabal, MD;  Location: [facility];  Service: Orthopedics;  Laterality: Left;   UPPER GASTROINTESTINAL ENDOSCOPY     WISDOM TOOTH EXTRACTION     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   Malignant neoplasm of upper-outer quadrant of right breast in female, estrogen receptor positive ([place]) 09/10/2022   Rash and other nonspecific skin eruption 09/15/2020   Allergic contact dermatitis 09/15/2020   Pruritus 08/14/2020   Dizziness 05/10/2018   Atypical chest pain 05/10/2018   Primary osteoarthritis of left knee 01/06/2018   S/P knee replacement 08/12/2017   Incomplete tear of right rotator cuff 05/19/2017   Osteopenia 05/19/2017   Urge incontinence of urine 05/19/2017   Primary open angle glaucoma (POAG) 12/30/2016   Primary osteoarthritis of both knees 12/30/2016   Recurrent oral herpes simplex infection 12/30/2016   Tubular adenoma of colon 12/30/2016   Hypothyroidism 06/25/2016   Right foot pain 12/19/2014   Anxiety 05/21/2011   Seasonal allergies 05/21/2011     REFERRING PROVIDER: Dr. Rolm Bookbinder  REFERRING DIAG: Right Breast Cancer  THERAPY DIAG:  Malignant neoplasm  of upper-outer quadrant of right breast in female, estrogen receptor positive ([place])  Abnormal posture  Chronic right shoulder pain  Stiffness of right shoulder, not elsewhere classified  Rationale for Evaluation and Treatment: Rehabilitation  ONSET DATE: 08/02/2022-  SUBJECTIVE:                                                                                                                                                                                           SUBJECTIVE STATEMENT:My shoulder ROM seems to be back to normal. I got cleared last Friday so I won't see them again for 6 months. I forgot  to do the scar massage. I had my first infusion and it went well. I didn't feel bad at all.    PERTINENT HISTORY:  Patient was diagnosed on 08/02/2022 with right grade 2 invasive ductal carcinoma breast cancer. It measures 1.5 cm and is located in the upper outer quadrant. It is triple positive with a Ki67 of 5%. She had a rotator cuff repair in her right shoulder 10/01/2021 which continues to be problematic. She had a right lumpectomy with SLNB on 10/05/2022    PATIENT GOALS:  Reassess how my recovery is going related to arm function, pain, and swelling.  PAIN:  Are you having pain? Yes: NPRS scale: 0/10 Pain location: right shoulder Pain description:   Aggravating factors:   Relieving factors: ice?  PRECAUTIONS: Recent Surgery, right UE Lymphedema risk, Other: right RTC repair 1 year ago  ACTIVITY LEVEL / LEISURE: walking 30 min almost every day.   OBJECTIVE:   PATIENT SURVEYS:  QUICK DASH: 22.73  OBSERVATIONS: Several cords noted in axillary region with atleast 1 extending beyond antecubital fossa. Significant UT compensation due to weakness/prior RTC repair. Steri strips still present over axillary and breast incision, No visible swelling. Small area of mild fibrosis/scar tissue at axillary incision.  POSTURE:  Forward head, rounded shoulders  LYMPHEDEMA ASSESSMENT:    UPPER EXTREMITY AROM/PROM:   A/PROM RIGHT   eval   RIGHT 09/24/2022 RIGHT 11/12/2022  Shoulder extension 37 40 45  Shoulder flexion 112 and painful 114 114  Shoulder abduction 140 and painful 60 significant UT compensation/popping 127  Shoulder internal rotation 39 with scapular compensation 65 supine   Shoulder external rotation 67 with scapular compensation 95 supine                           (Blank rows = not tested)   A/PROM LEFT   eval  Shoulder extension 51  Shoulder flexion 151  Shoulder abduction 158  Shoulder internal rotation 59  Shoulder external rotation  74                           (Blank rows = not tested)   CERVICAL AROM: All within normal limits   UPPER EXTREMITY STRENGTH: WNL   LYMPHEDEMA ASSESSMENTS:    LANDMARK RIGHT   eval RIGHT 09/24/2022  10 cm proximal to olecranon process 26.8 27.3  Olecranon process 22.6 22.6  10 cm proximal to ulnar styloid process 20.2 20.0  Just proximal to ulnar styloid process 14.5 14.7  Across hand at thumb web space 17.5 18  At base of 2nd digit 5.9 5.6  (Blank rows = not tested)   LANDMARK LEFT   eval  10 cm proximal to olecranon process 27.6  Olecranon process 22.8  10 cm proximal to ulnar styloid process 19.8  Just proximal to ulnar styloid process 14.3  Across hand at thumb web space 18.3  At base of 2nd digit 5.7  (Blank rows = not tested)   Surgery type/Date: 10/05/2022 right Lumpectomy with SLNB Number of lymph nodes removed: 0/2 Current/past treatment (chemo, radiation, hormone therapy): pending radiation, anti estrogens and Herceptin Other symptoms:  Heaviness/tightness No Pain Yes Pitting edema No Infections No Decreased scar mobility Yes Stemmer sign No   TREATMENT TODAY  11/12/2022 Soft tissue mobilization to right UT, pectorals and Lats with cocoa butter in supine Pt educated in scar massage to right breast incisions to start gently, and reminded about vitamin E,  PROM right shoulder with VC's to pt to depress scapula and relax shoulder for flexion, scaption, abduction, ER Pts right shoulder in scaption with arm resting on a pillow for MFR to areas of cording in right axilla    11/01/2022 Wall slides x 4 for abd with PT hand on UT to prevent compensation. Discontinued due to  significant popping in shoulder. Tried standing NTS but pt was not able to feel puling in axillary region. Soft tissue mobilization to right UT, pectorals and Lats with cocoa butter in supine Pt educated in scar massage to right breast incision to start gently, but will wait on axillary region due to scabs still  present. PROM right shoulder with VC's to pt to depress scapula and relax shoulder for flexion, scaption, abduction, ER Pts right shoulder in scaption with arm resting on a pillow for MFR to areas of cording in right axilla and extending into forearm.  Added elbow extension  and wrist oscillation while in this position to stretch cording.    PATIENT EDUCATION:  10/25/2022 Education details: Cording, NTS on wall, SOZO screens, ABC class, Made foam pads for lower border of bra to prevent rolling and right axillary region, scar massage Person educated: Patient Education method: Explanation, demonstation, handout Education comprehension: verbalized understanding  HOME EXERCISE PROGRAM: Reviewed previously given post op HEP. Tried abduction wall slide and emphasized scapular depression. Able to do but sometimes with compensation and not feeling a lot of stretch. Showed chest stretch with right hand behind her an wall and oscillating wrist to stretch cording. Gave illustrated picture  ASSESSMENT:  CLINICAL IMPRESSION: Pt is doing very well overall. She has no pain complaints and her incisions are nicely healed. Shoulder ROM is nearly at PLOF, with mild limitation for abd. She has attended the [facility] class online. We discussed continuing compression bra during the duration of radiation as long as skin is not too irritated. We also discussed doing ROM exercises for duration of radiation and slightly beyond. Pt was  advised to call or email with any questions or concerns. There are no further PT needs noted at this time.  Pt will benefit from skilled therapeutic intervention to improve on the following deficits: Decreased knowledge of precautions, impaired UE functional use, pain, decreased ROM, postural dysfunction.   PT treatment/interventions: ADL/Self care home management, Therapeutic exercises, Neuromuscular re-education, Patient/Family education, Self Care, scar mobilization, Manual therapy, and  Re-evaluation   GOALS: Goals reviewed with patient? Yes  LONG TERM GOALS:  (STG=LTG)  GOALS Name Target Date  Goal status  1 Pt will demonstrate she has regained full shoulder ROM and function post operatively compared to baselines.  Baseline: 10/25/2022 MET Except abduction limited by scapular compensation but [facility] passively  2 Pt will have decreased right medial arm sensitivity/discomfort due to cording 11/22/2022 MET 11/12/2022  3     4        PLAN:  PT FREQUENCY/DURATION: 1-2x/week x 4 weeks  PLAN FOR NEXT SESSION:   DC to independent self management PHYSICAL THERAPY DISCHARGE SUMMARY  Visits from Start of Care: 4  Current functional level related to goals / functional outcomes: Achieved goals, no longer having pain   Remaining deficits: Limitations in Right shoulder ROM due to prior RTC repair   Education / Equipment: Compression bra   Patient agrees to discharge. Patient goals were met. Patient is being discharged due to meeting the stated rehab goals.     [facility]  [address]  [place] [address]  [phone]  After Breast Cancer Class It is recommended you attend the ABC class to be educated on lymphedema risk reduction. This class is free of charge and lasts for 1 hour. It is a 1-time class. You will need to download the Webex app either on your phone or computer. We will send you a link the night before or the morning of the class. You should be able to click on that link to join the class. This is not a confidential class. You don't have to turn your camera on, but other participants may be able to see your email address.  Scar massage You can begin gentle scar massage to you incision sites. Gently place one hand on the incision and move the skin (without sliding on the skin) in various directions. Do this for a few minutes and then you can gently massage either coconut oil or vitamin E cream into the  scars.  Compression garment You should continue wearing your compression bra until you feel like you no longer have swelling.  Home exercise Program Continue doing the exercises you were given until you feel like you can do them without feeling any tightness at the end.   Walking Program Studies show that 30 minutes of walking per day (fast enough to elevate your heart rate) can significantly reduce the risk of a cancer recurrence. If you can't walk due to other medical reasons, we encourage you to find another activity you could do (like a stationary bike or water exercise).  Posture After breast cancer surgery, people frequently sit with rounded shoulders posture because it puts their incisions on slack and feels better. If you sit like this and scar tissue forms in that position, you can become very tight and have pain sitting or standing with good posture. Try to be aware of your posture and sit and stand up tall to heal properly.  Follow up PT: It is recommended you return every 3 months for the first  3 years following surgery to be assessed on the SOZO machine for an L-Dex score. This helps prevent clinically significant lymphedema in 95% of patients. These follow up screens are 10 minute appointments that you are not billed for.  Claris Pong, PT 11/12/2022, 10:05 AM

## 2022-11-15 ENCOUNTER — Encounter: Payer: Self-pay | Admitting: *Deleted

## 2022-11-15 ENCOUNTER — Other Ambulatory Visit: Payer: Self-pay

## 2022-11-15 ENCOUNTER — Inpatient Hospital Stay: Payer: Medicare Other

## 2022-11-15 ENCOUNTER — Other Ambulatory Visit: Payer: Medicare Other

## 2022-11-15 ENCOUNTER — Inpatient Hospital Stay (HOSPITAL_BASED_OUTPATIENT_CLINIC_OR_DEPARTMENT_OTHER): Payer: Medicare Other | Admitting: Adult Health

## 2022-11-15 ENCOUNTER — Inpatient Hospital Stay: Payer: Medicare Other | Attending: Hematology and Oncology

## 2022-11-15 ENCOUNTER — Telehealth: Payer: Self-pay | Admitting: *Deleted

## 2022-11-15 ENCOUNTER — Encounter: Payer: Self-pay | Admitting: Emergency Medicine

## 2022-11-15 DIAGNOSIS — Z5111 Encounter for antineoplastic chemotherapy: Secondary | ICD-10-CM | POA: Insufficient documentation

## 2022-11-15 DIAGNOSIS — Z170 Estrogen receptor positive status [ER+]: Secondary | ICD-10-CM | POA: Insufficient documentation

## 2022-11-15 DIAGNOSIS — Z95828 Presence of other vascular implants and grafts: Secondary | ICD-10-CM

## 2022-11-15 DIAGNOSIS — Z5112 Encounter for antineoplastic immunotherapy: Secondary | ICD-10-CM | POA: Diagnosis not present

## 2022-11-15 DIAGNOSIS — Z87891 Personal history of nicotine dependence: Secondary | ICD-10-CM | POA: Insufficient documentation

## 2022-11-15 DIAGNOSIS — C50411 Malignant neoplasm of upper-outer quadrant of right female breast: Secondary | ICD-10-CM | POA: Diagnosis not present

## 2022-11-15 HISTORY — DX: Presence of other vascular implants and grafts: Z95.828

## 2022-11-15 LAB — CMP (CANCER CENTER ONLY)
ALT: 18 U/L (ref 0–44)
AST: 16 U/L (ref 15–41)
Albumin: 4 g/dL (ref 3.5–5.0)
Alkaline Phosphatase: 95 U/L (ref 38–126)
Anion gap: 5 (ref 5–15)
BUN: 11 mg/dL (ref 8–23)
CO2: 30 mmol/L (ref 22–32)
Calcium: 8.9 mg/dL (ref 8.9–10.3)
Chloride: 101 mmol/L (ref 98–111)
Creatinine: 0.88 mg/dL (ref 0.44–1.00)
GFR, Estimated: >60 mL/min (ref >60)
Glucose, Bld: 115 mg/dL — ABNORMAL HIGH (ref 70–99)
Potassium: 4 mmol/L (ref 3.5–5.1)
Sodium: 136 mmol/L (ref 135–145)
Total Bilirubin: 0.3 mg/dL (ref 0.3–1.2)
Total Protein: 6.4 g/dL — ABNORMAL LOW (ref 6.5–8.1)

## 2022-11-15 LAB — CBC WITH DIFFERENTIAL (CANCER CENTER ONLY)
Abs Immature Granulocytes: 0.09 10*3/uL — ABNORMAL HIGH (ref 0.00–0.07)
Basophils Absolute: 0.1 10*3/uL (ref 0.0–0.1)
Basophils Relative: 1 %
Eosinophils Absolute: 0.5 10*3/uL (ref 0.0–0.5)
Eosinophils Relative: 7 %
HCT: 32.8 % — ABNORMAL LOW (ref 36.0–46.0)
Hemoglobin: 10.8 g/dL — ABNORMAL LOW (ref 12.0–15.0)
Immature Granulocytes: 1 %
Lymphocytes Relative: 19 %
Lymphs Abs: 1.2 10*3/uL (ref 0.7–4.0)
MCH: 26.6 pg (ref 26.0–34.0)
MCHC: 32.9 g/dL (ref 30.0–36.0)
MCV: 80.8 fL (ref 80.0–100.0)
Monocytes Absolute: 0.4 10*3/uL (ref 0.1–1.0)
Monocytes Relative: 7 %
Neutro Abs: 4.1 10*3/uL (ref 1.7–7.7)
Neutrophils Relative %: 65 %
Platelet Count: 302 10*3/uL (ref 150–400)
RBC: 4.06 MIL/uL (ref 3.87–5.11)
RDW: 14.4 % (ref 11.5–15.5)
WBC Count: 6.3 10*3/uL (ref 4.0–10.5)
nRBC: 0 % (ref 0.0–0.2)

## 2022-11-15 MED ORDER — SODIUM CHLORIDE 0.9% FLUSH: 10.0000 mL | Freq: Once | INTRAVENOUS | Status: DC

## 2022-11-15 MED ORDER — FAMOTIDINE IN NACL 20-0.9 MG/50ML-% IV SOLN
20.0000 mg | Freq: Once | INTRAVENOUS | Status: AC
  Administered 2022-11-15: 20 mg via INTRAVENOUS
  Filled 2022-11-15: qty 50

## 2022-11-15 MED ORDER — SODIUM CHLORIDE 0.9 % IV SOLN
10.0000 mg | Freq: Once | INTRAVENOUS | Status: AC
  Administered 2022-11-15: 10 mg via INTRAVENOUS
  Filled 2022-11-15: qty 10

## 2022-11-15 MED ORDER — ACETAMINOPHEN 325 MG PO TABS
650.0000 mg | ORAL_TABLET | Freq: Once | ORAL | Status: AC
  Administered 2022-11-15: 650 mg via ORAL
  Filled 2022-11-15: qty 2

## 2022-11-15 MED ORDER — SODIUM CHLORIDE 0.9 % IV SOLN
80.0000 mg/m2 | Freq: Once | INTRAVENOUS | Status: AC
  Administered 2022-11-15: 144 mg via INTRAVENOUS
  Filled 2022-11-15: qty 24

## 2022-11-15 MED ORDER — TRASTUZUMAB-DTTB CHEMO 150 MG IV SOLR
2.0000 mg/kg | Freq: Once | INTRAVENOUS | Status: AC
  Administered 2022-11-15: 150 mg via INTRAVENOUS
  Filled 2022-11-15: qty 7.14

## 2022-11-15 MED ORDER — DIPHENHYDRAMINE HCL 50 MG/ML IJ SOLN
50.0000 mg | Freq: Once | INTRAMUSCULAR | Status: AC
  Administered 2022-11-15: 50 mg via INTRAVENOUS
  Filled 2022-11-15: qty 1

## 2022-11-15 MED ORDER — SODIUM CHLORIDE 0.9 % IV SOLN: Freq: Once | INTRAVENOUS | Status: AC

## 2022-11-15 NOTE — Progress Notes (Signed)
Judy Morrison Cancer Follow up:    Judy Morrison, Judy Morrison [id number]   DIAGNOSIS:  Cancer Staging  Malignant neoplasm of upper-outer quadrant of right breast in female, estrogen receptor positive (Judy Morrison) Staging form: Breast, AJCC 8th Edition - Clinical stage from 09/15/2022: Stage IA (cT1c, cN0, cM0, G2, ER+, PR+, HER2+) - Unsigned Stage prefix: Initial diagnosis Histologic grading system: 3 grade system Laterality: Right Staged by: Pathologist and managing physician Stage used in treatment planning: Yes National guidelines used in treatment planning: Yes Type of national guideline used in treatment planning: NCCN - Pathologic stage from 10/05/2022: Stage IA (pT1c, pN0, cM0, G2, ER+, PR+, HER2+) - Signed by Judy Phlegm, NP on 11/08/2022 Stage prefix: Initial diagnosis Histologic grading system: 3 grade system   SUMMARY OF ONCOLOGIC HISTORY: Oncology History  Malignant neoplasm of upper-outer quadrant of right breast in female, estrogen receptor positive (Judy Morrison)  08/02/2022 Mammogram   In the right breast, possible distortion warrants further evaluation. In the left breast, no findings suspicious for malignancy. Diagnostic mammogram showed persistent subtle distortion central slightly LATERAL aspect of the RIGHT breast warranting tissue diagnosis.     09/01/2022 Pathology Results   Final pathology showed grade 2 invasive mammary carcinoma, prognostic showed ER 90% positive strong staining PR 90% positive strong staining, Ki-67 of 5% and HER2 3+ by [facility]   09/10/2022 Initial Diagnosis   Malignant neoplasm of upper-outer quadrant of right breast in female, estrogen receptor positive (Judy Morrison)    Genetic Testing   [facility] CancerNext-Expanded Panel+RNA was Positive. 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 Report date is  10/04/2022.  The CancerNext-Expanded gene panel offered by [facility] and includes sequencing, rearrangement, and RNA analysis for the following 77 genes: AIP, ALK, APC, ATM, AXIN2, BAP1, BARD1, BLM, BMPR1A, BRCA1, BRCA2, BRIP1, CDC73, CDH1, CDK4, CDKN1B, CDKN2A, CHEK2, CTNNA1, DICER1, FANCC, FH, FLCN, GALNT12, KIF1B, LZTR1, MAX, MEN1, MET, MLH1, MSH2, MSH3, MSH6, MUTYH, NBN, NF1, NF2, NTHL1, PALB2, PHOX2B, PMS2, POT1, PRKAR1A, PTCH1, PTEN, RAD51C, RAD51D, RB1, RECQL, RET, SDHA, SDHAF2, SDHB, SDHC, SDHD, SMAD4, SMARCA4, SMARCB1, SMARCE1, STK11, SUFU, TMEM127, TP53, TSC1, TSC2, VHL and XRCC2 (sequencing and deletion/duplication); EGFR, EGLN1, HOXB13, KIT, MITF, PDGFRA, POLD1, and POLE (sequencing only); EPCAM and GREM1 (deletion/duplication only).    10/05/2022 Surgery   Right breast lumpectomy with Dr. 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  Grade 2, 2 sentinel lymph nodes negative for metastasis.  Margins negative.  T1c, N0.   10/05/2022 Cancer Staging   Staging form: Breast, AJCC 8th Edition - Pathologic stage from 10/05/2022: Stage IA (pT1c, pN0, cM0, G2, ER+, PR+, HER2+) - Signed by Judy Phlegm, NP on 11/08/2022 Stage prefix: Initial diagnosis Histologic grading system: 3 grade system   11/08/2022 -  Chemotherapy   Patient is on Treatment Plan : BREAST Paclitaxel + Trastuzumab q7d / Trastuzumab q21d       CURRENT THERAPY: Taxol/Herceptin  INTERVAL HISTORY: Judy Morrison 72 y.o. female returns for follow-up prior to receiving her second week of Taxol and Herceptin.  She tells me that she tolerated it relatively well.  The main issue she experienced was soreness in her mouth however she paid special attention to her oral care to help with this side effect.  She did not develop any new ulcerations or mouth sores.  She denies any nausea vomiting and she denies peripheral neuropathy.  She has been noticed to have more dyspnea on exertion and we  got a chest x-ray at her last visit which was negative for any acute abnormality.  It did show that the left chest port tip was mildly retracted in the upper SVC.  Per the chemotherapy protocol she will need to have this port revised and will not be able to receive chemotherapy until it is adjusted to the distal SVC.  They will Dytac, RN let Dr. Donne Morrison know about this earlier today.   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   Malignant neoplasm of upper-outer quadrant of right breast in female, estrogen receptor positive (Judy Morrison) 09/10/2022   Rash and other nonspecific skin eruption 09/15/2020   Allergic contact dermatitis 09/15/2020   Pruritus 08/14/2020   Dizziness 05/10/2018   Atypical chest pain 05/10/2018   Primary osteoarthritis of left knee 01/06/2018   S/P knee replacement 08/12/2017   Incomplete tear of right rotator cuff 05/19/2017   Osteopenia 05/19/2017   Urge incontinence of urine 05/19/2017   Primary open angle glaucoma (POAG) 12/30/2016   Primary osteoarthritis of both knees 12/30/2016   Recurrent oral herpes simplex infection 12/30/2016   Tubular adenoma of colon 12/30/2016   Hypothyroidism 06/25/2016   Right foot pain 12/19/2014   Anxiety 05/21/2011   Seasonal allergies 05/21/2011    is allergic to penicillin g, cephalexin, codeine, erythromycin, and penicillins.  MEDICAL HISTORY: Past Medical History:  Diagnosis Date   Anemia    Anxiety    Arthritis    Depressed    Dermatitis    rare form, on prednisone   GERD (gastroesophageal reflux disease)    controlled with Omeprazole   Glaucoma    Torn rotator cuff    RIGHT    Urinary tract infection    dx 05-26-17 took 1 week cipro BID completed 06-02-17.    SURGICAL HISTORY: Past Surgical History:  Procedure Laterality Date   ADENOIDECTOMY     APPENDECTOMY     arthroscopic right knee       BREAST BIOPSY Right 09/01/2022   MM RT BREAST BX W LOC DEV 1ST LESION IMAGE BX SPEC STEREO GUIDE 09/01/2022 [facility]   BREAST BIOPSY  10/01/2022   MM RT RADIOACTIVE SEED LOC MAMMO GUIDE 10/01/2022 [facility]   BREAST LUMPECTOMY WITH RADIOACTIVE SEED AND SENTINEL LYMPH NODE BIOPSY Right 10/05/2022   Procedure: RIGHT BREAST LUMPECTOMY WITH RADIOACTIVE SEED AND AXILLARY SENTINEL LYMPH NODE BIOPSY;  Surgeon: Rolm Bookbinder, MD;  Location: [facility];  Service: General;  Laterality: Right;   CHOLECYSTECTOMY     COLONOSCOPY  06/06/2017   Pyrtle   Pelvic sling     x2   POLYPECTOMY     PORTACATH PLACEMENT Left 10/05/2022   Procedure: INSERTION PORT-A-CATH;  Surgeon: Rolm Bookbinder, MD;  Location: [facility];  Service: General;  Laterality: Left;   ROTATOR CUFF REPAIR Right    TONSILLECTOMY     TOTAL KNEE ARTHROPLASTY Right 08/12/2017   Procedure: RIGHT TOTAL KNEE ARTHROPLASTY;  Surgeon: Sydnee Cabal, MD;  Location: [facility];  Service: Orthopedics;  Laterality: Right;   TOTAL KNEE ARTHROPLASTY Left 01/06/2018   Procedure: LEFT TOTAL KNEE ARTHROPLASTY;  Surgeon: Sydnee Cabal, MD;  Location: [facility];  Service: Orthopedics;  Laterality: Left;   UPPER GASTROINTESTINAL ENDOSCOPY     WISDOM TOOTH EXTRACTION  SOCIAL HISTORY: Social History   Socioeconomic History   Marital status: Single    Spouse name: Not on file   Number of children: Not on file   Years of education: Not on file   Highest education level: Not on file  Occupational History   Not on file  Tobacco Use   Smoking status: Former    Types: Cigarettes    Quit date: 29    Years since quitting: 38.1   Smokeless tobacco: Never  Vaping Use   Vaping Use: Never used  Substance and Sexual Activity   Alcohol use: Yes    Alcohol/week: 2.0 standard drinks of alcohol    Types: 2 Cans of beer per week   Drug use: No   Sexual activity: Not on file  Other Topics Concern    Not on file  Social History Narrative   Not on file   Social Determinants of Health   Financial Resource Strain: Not on file  Food Insecurity: No Food Insecurity (09/15/2022)   Hunger Vital Sign    Worried About Running Out of Food in the Last Year: Never true    Ran Out of Food in the Last Year: Never true  Transportation Needs: No Transportation Needs (09/15/2022)   PRAPARE - Hydrologist (Medical): No    Lack of Transportation (Non-Medical): No  Physical Activity: Not on file  Stress: Not on file  Social Connections: Not on file  Intimate Partner Violence: Not on file    FAMILY HISTORY: Family History  Problem Relation Age of Onset   Bowel Disease Mother        ishemic bowel   Lung cancer Mother        smoked   Cervical cancer Paternal Aunt    Cancer Maternal Grandfather        unknown type   Lung cancer Paternal Grandfather        smoked   Colon cancer Neg Hx    Breast cancer Neg Hx    Esophageal cancer Neg Hx    Pancreatic cancer Neg Hx    Prostate cancer Neg Hx    Rectal cancer Neg Hx    Stomach cancer Neg Hx    Allergic rhinitis Neg Hx    Asthma Neg Hx    Eczema Neg Hx    Urticaria Neg Hx    Colon polyps Neg Hx     Review of Systems  Constitutional:  Negative for appetite change, chills, fatigue, fever and unexpected weight change.  HENT:   Negative for hearing loss, lump/mass and trouble swallowing.   Eyes:  Negative for eye problems and icterus.  Respiratory:  Negative for chest tightness, cough and shortness of breath.   Cardiovascular:  Negative for chest pain, leg swelling and palpitations.  Gastrointestinal:  Negative for abdominal distention, abdominal pain, constipation, diarrhea, nausea and vomiting.  Endocrine: Negative for hot flashes.  Genitourinary:  Negative for difficulty urinating.   Musculoskeletal:  Negative for arthralgias.  Skin:  Negative for itching and rash.  Neurological:  Negative for dizziness,  extremity weakness, headaches and numbness.  Hematological:  Negative for adenopathy. Does not bruise/bleed easily.  Psychiatric/Behavioral:  Negative for depression. The patient is not nervous/anxious.       PHYSICAL EXAMINATION  ECOG PERFORMANCE STATUS: 1 - Symptomatic but completely ambulatory  Vitals:   11/15/22 1401  BP: 121/70  Pulse: 97  Resp: 18  Temp: 97.7 F (36.5 C)  SpO2: 99%  Physical Exam Constitutional:      General: She is not in acute distress.    Appearance: Normal appearance. She is not toxic-appearing.  HENT:     Head: Normocephalic and atraumatic.  Eyes:     General: No scleral icterus. Cardiovascular:     Rate and Rhythm: Normal rate and regular rhythm.     Pulses: Normal pulses.     Heart sounds: Normal heart sounds.  Pulmonary:     Effort: Pulmonary effort is normal.     Breath sounds: Normal breath sounds.  Abdominal:     General: Abdomen is flat. Bowel sounds are normal. There is no distension.     Palpations: Abdomen is soft.     Tenderness: There is no abdominal tenderness.  Musculoskeletal:        General: No swelling.     Cervical back: Neck supple.  Lymphadenopathy:     Cervical: No cervical adenopathy.  Skin:    General: Skin is warm and dry.     Findings: No rash.  Neurological:     General: No focal deficit present.     Mental Status: She is alert.  Psychiatric:        Mood and Affect: Mood normal.        Behavior: Behavior normal.     LABORATORY DATA:  CBC    Component Value Date/Time   WBC 6.3 11/15/2022 1324   WBC 10.1 10/01/2021 1314   RBC 4.06 11/15/2022 1324   HGB 10.8 (L) 11/15/2022 1324   HGB 13.7 09/15/2020 1209   HCT 32.8 (L) 11/15/2022 1324   HCT 41.3 09/15/2020 1209   PLT 302 11/15/2022 1324   PLT 349 09/15/2020 1209   MCV 80.8 11/15/2022 1324   MCV 86 09/15/2020 1209   MCH 26.6 11/15/2022 1324   MCHC 32.9 11/15/2022 1324   RDW 14.4 11/15/2022 1324   RDW 12.3 09/15/2020 1209   LYMPHSABS 1.2  11/15/2022 1324   LYMPHSABS 1.2 09/15/2020 1209   MONOABS 0.4 11/15/2022 1324   EOSABS 0.5 11/15/2022 1324   EOSABS 0.2 09/15/2020 1209   BASOSABS 0.1 11/15/2022 1324   BASOSABS 0.1 09/15/2020 1209    CMP     Component Value Date/Time   NA 136 11/15/2022 1324   NA 135 09/15/2020 1209   K 4.0 11/15/2022 1324   CL 101 11/15/2022 1324   CO2 30 11/15/2022 1324   GLUCOSE 115 (H) 11/15/2022 1324   BUN 11 11/15/2022 1324   BUN 14 09/15/2020 1209   CREATININE 0.88 11/15/2022 1324   CALCIUM 8.9 11/15/2022 1324   PROT 6.4 (L) 11/15/2022 1324   PROT 6.8 09/15/2020 1209   ALBUMIN 4.0 11/15/2022 1324   ALBUMIN 4.6 09/15/2020 1209   AST 16 11/15/2022 1324   ALT 18 11/15/2022 1324   ALKPHOS 95 11/15/2022 1324   BILITOT 0.3 11/15/2022 1324   GFRNONAA >60 11/15/2022 1324   GFRAA 74 09/15/2020 1209    ASSESSMENT and THERAPY PLAN:   Malignant neoplasm of upper-outer quadrant of right breast in female, estrogen receptor positive ([place]) Judy Morrison is a 72 year old woman with history of stage Ia triple positive breast cancer diagnosed in January 2024 status postlumpectomy and here today to begin adjuvant chemotherapy and immunotherapy with paclitaxel and trastuzumab.  She will receive paclitaxel weekly along with trastuzumab weekly for 12 weeks.  After that she will go on to receive maintenance trastuzumab every 3 weeks to complete 1 year of anti-HER2 therapy.  She is doing well today  and tolerated her first week of chemotherapy relatively well.  She will proceed with this today.  Instead of receiving this through her port however we will need to use a peripheral IV.  Her chest x-ray was negative for any infection or other etiology for her increased shortness of breath on exertion.  I did place a referral to cardiology last week for further evaluation since she is receiving Herceptin.  She did a great job with her mouth care considering that was her only side effect.  I recommend that she  continue to do this and let us know if there is anything that she needs between now and her next visit with [place].  We will see her back in 1 week for labs, follow-up, and her next treatment.   All questions were answered. The patient knows to call the clinic with any problems, questions or concerns. We can certainly see the patient much sooner if necessary.  Total encounter time:20 minutes*in face-to-face visit time, chart review, lab review, care coordination, order entry, and documentation of the encounter time.  Wilber Bihari, NP 11/15/22 3:49 PM Medical Oncology and Hematology [facility] [place], [place] [address] Tel. [phone]    Fax. [phone]  *Total Encounter Time as defined by the Centers for Medicare and Medicaid Services includes, in addition to the face-to-face time of a patient visit (documented in the note above) non-face-to-face time: obtaining and reviewing outside history, ordering and reviewing medications, tests or procedures, care coordination (communications with other health care professionals or caregivers) and documentation in the medical record.

## 2022-11-15 NOTE — Patient Instructions (Signed)
[facility]  Discharge Instructions: Thank you for choosing [facility] to provide your oncology and hematology care.   If you have a lab appointment with the [facility], please go directly to the [facility] and check in at the registration area.   Wear comfortable clothing and clothing appropriate for easy access to any Portacath or PICC line.   We strive to give you quality time with your provider. You may need to reschedule your appointment if you arrive late (15 or more minutes).  Arriving late affects you and other patients whose appointments are after yours.  Also, if you miss three or more appointments without notifying the office, you may be dismissed from the clinic at the provider's discretion.      For prescription refill requests, have your pharmacy contact our office and allow 72 hours for refills to be completed.    Today you received the following chemotherapy and/or immunotherapy agents Trastuzumab & Paclitaxel      To help prevent nausea and vomiting after your treatment, we encourage you to take your nausea medication as directed.  BELOW ARE SYMPTOMS THAT SHOULD BE REPORTED IMMEDIATELY: *FEVER GREATER THAN 100.4 F (38 C) OR HIGHER *CHILLS OR SWEATING *NAUSEA AND VOMITING THAT IS NOT CONTROLLED WITH YOUR NAUSEA MEDICATION *UNUSUAL SHORTNESS OF BREATH *UNUSUAL BRUISING OR BLEEDING *URINARY PROBLEMS (pain or burning when urinating, or frequent urination) *BOWEL PROBLEMS (unusual diarrhea, constipation, pain near the anus) TENDERNESS IN MOUTH AND THROAT WITH OR WITHOUT PRESENCE OF ULCERS (sore throat, sores in mouth, or a toothache) UNUSUAL RASH, SWELLING OR PAIN  UNUSUAL VAGINAL DISCHARGE OR ITCHING   Items with * indicate a potential emergency and should be followed up as soon as possible or go to the Emergency Department if any problems should occur.  Please show the CHEMOTHERAPY ALERT CARD or IMMUNOTHERAPY ALERT  CARD at check-in to the Emergency Department and triage nurse.  Should you have questions after your visit or need to cancel or reschedule your appointment, please contact [facility]  Dept: [phone]  and follow the prompts.  Office hours are 8:00 a.m. to 4:30 p.m. Monday - Friday. Please note that voicemails left after 4:00 p.m. may not be returned until the following business day.  We are closed weekends and major holidays. You have access to a nurse at all times for urgent questions. Please call the main number to the clinic Dept: [phone] and follow the prompts.   For any non-urgent questions, you may also contact your provider using MyChart. We now offer e-Visits for anyone 9 and older to request care online for non-urgent symptoms. For details visit [email or web address].   Also download the MyChart app! Go to the app store, search "MyChart", open the app, select [facility], and log in with your MyChart username and password.

## 2022-11-15 NOTE — Assessment & Plan Note (Signed)
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  She will receive paclitaxel weekly along with trastuzumab weekly for 12 weeks.  After that she will go on to receive maintenance trastuzumab every 3 weeks to complete 1 year of anti-HER2 therapy.  She is doing well today and tolerated her first week of chemotherapy relatively well.  She will proceed with this today.  Instead of receiving this through her port however we will need to use a peripheral IV.  Her chest x-ray was negative for any infection or other etiology for her increased shortness of breath on exertion.  I did place a referral to cardiology last week for further evaluation since she is receiving Herceptin.  She did a great job with her mouth care considering that was her only side effect.  I recommend that she continue to do this and let us know if there is anything that she needs between now and her next visit with [place].  We will see her back in 1 week for labs, follow-up, and her next treatment.

## 2022-11-15 NOTE — Research (Signed)
S2205, ICE COMPRESS: RANDOMIZED TRIAL OF LIMB CRYOCOMPRESSION VERSUS CONTINUOUS COMPRESSION VERSUS LOW CYCLIC COMPRESSION FOR THE PREVENTION  OF TAXANE-INDUCED PERIPHERAL NEUROPATHY   Cycle 2 treatment   This nurse went to the infusion room and completed this pt's study intervention after the pt's covering nurse, Donell Sievert, left at 5:30 pm.    ARM 2 - Continuous Compression    5:22 pm - Taxane infusion completed and post-infusion started 5:38 pm - Tolerability check completed - no complaints by pt  5:52 pm - post-infusion completed, wraps removed.  Pt denied any adverse events.  Pt's skin was assessed.  Some redness noted on patient's right hand.  Pt specifically denies any pain.  The pt was encouraged to contact her doctor and research staff if she has any concerns.      The pt was thanked for her support of this clinical trial.  The pt had no questions or concerns for the research staff.   Brion Aliment RN, BSN, CCRP Clinical Research Nurse Lead 11/15/2022 6:16 PM

## 2022-11-15 NOTE — Telephone Encounter (Signed)
MD was notified by treatment room nurse of reading of most recent CXR result (performed 2/26 per visit with midlevel d/t shortness of breath) showing change in tip of port now in the upper SVC/brachiocephalic vein.  Per IV chemotherapy scheduled for today - MD requested peripheral IV for treatment with request to forward information to surgeon who placed port for possible revision.  This note will be forwarded to Dr Donne Hazel per above.  Note pt is receiving weekly taxol for planned 12 cycles - today is cycle 2.

## 2022-11-15 NOTE — Research (Unsigned)
S2205, ICE COMPRESS: RANDOMIZED TRIAL OF LIMB CRYOCOMPRESSION VERSUS CONTINUOUS COMPRESSION VERSUS LOW CYCLIC COMPRESSION FOR THE PREVENTION OF TAXANE-INDUCED PERIPHERAL NEUROPATHY  Cycle 2- Arm 2 (Continuous Compression)   Patient arrives today (11/15/22) Accompanied by her daughter  for her Cycle 2 of treatment.  MD/PROVIDER VISIT: Patient saw NP Wilber Bihari for [facility] visit today.   ADVERSE EVENTS: CTCAE 5.0 Patient Judy Morrison does not report any AE's related to study device.  Patient does not report any peripheral sensory or motor neuropathy.  Patient has some dyspnea on exertion (Grade 1) that she is being referred to cardiologist for (negative recent lung scan), mouth soreness (Grade 1 mucositis) with no ulcers present, and some abnormal labs (including Grade 1 hyperglycemia and Grade 1 anemia) which are present today but unrelated to study drug.  No action or reporting or intervention at this time.   STUDY INTERVENTION: Patient's chest port tip has retracted per recent scan, cannot be used until approval from surgeon (provider has requested PIV use for today).  Patient had peripheral IV placed today in [place].  Patient's left arm was wrapped below IV site.  All other limbs wrapped completely.  Study intervention initiated at 3:38 pm.         Taxane therapy started at 4:19 pm Study intervention continued throughout Taxane therapy. ***Device temporarily paused from *** pm for ***.  No further interruptions throughout rest of treatment.   Taxane therapy completed at *** Study intervention completed at ***   TOLERABILITY ASSESSMENTS:             Tolerability assessments performed at:             5-10 min post intervention start: 3:43 pm.  Tolerable.             Hour 1 after starting:  4:38 pm.  Tolerable.             Hour 2 after starting: ***.  ***Tolerable/intolerable   DISPOSITION: Patient's care was transferred to RN Doristine Johns for remainder of visit.***  The patient was  thanked for their time and continued voluntary participation in this study. Patient Judy Morrison has been provided direct contact information and is encouraged to contact this Nurse for any needs or questions.  ***

## 2022-11-16 ENCOUNTER — Encounter: Payer: Self-pay | Admitting: Hematology and Oncology

## 2022-11-17 ENCOUNTER — Encounter: Payer: Self-pay | Admitting: Hematology and Oncology

## 2022-11-19 ENCOUNTER — Other Ambulatory Visit: Payer: Self-pay | Admitting: General Surgery

## 2022-11-19 NOTE — Progress Notes (Signed)
Attempted to call pt for pre-op call and left message for her to call me back and no return call. Left pre-op instructions on pt's voicemail and her daughter's voicemail.

## 2022-11-22 ENCOUNTER — Ambulatory Visit (HOSPITAL_COMMUNITY)
Admission: RE | Admit: 2022-11-22 | Discharge: 2022-11-22 | Disposition: A | Discharge status: Home / Self Care | Payer: Medicare Other | Attending: General Surgery | Admitting: General Surgery

## 2022-11-22 ENCOUNTER — Encounter (HOSPITAL_COMMUNITY)
Admission: RE | Disposition: A | Discharge status: Home / Self Care | Payer: Self-pay | Source: Home / Self Care | Attending: General Surgery

## 2022-11-22 ENCOUNTER — Ambulatory Visit (HOSPITAL_COMMUNITY): Payer: Medicare Other

## 2022-11-22 ENCOUNTER — Other Ambulatory Visit: Payer: Self-pay

## 2022-11-22 ENCOUNTER — Encounter (HOSPITAL_COMMUNITY): Payer: Self-pay | Admitting: General Surgery

## 2022-11-22 ENCOUNTER — Ambulatory Visit (HOSPITAL_COMMUNITY): Payer: Medicare Other | Admitting: Anesthesiology

## 2022-11-22 ENCOUNTER — Ambulatory Visit (HOSPITAL_BASED_OUTPATIENT_CLINIC_OR_DEPARTMENT_OTHER): Payer: Medicare Other | Admitting: Anesthesiology

## 2022-11-22 DIAGNOSIS — Z170 Estrogen receptor positive status [ER+]: Secondary | ICD-10-CM | POA: Diagnosis not present

## 2022-11-22 DIAGNOSIS — T82898A Other specified complication of vascular prosthetic devices, implants and grafts, initial encounter: Secondary | ICD-10-CM | POA: Diagnosis present

## 2022-11-22 DIAGNOSIS — Z87891 Personal history of nicotine dependence: Secondary | ICD-10-CM | POA: Insufficient documentation

## 2022-11-22 DIAGNOSIS — F419 Anxiety disorder, unspecified: Secondary | ICD-10-CM | POA: Diagnosis not present

## 2022-11-22 DIAGNOSIS — T82594A Other mechanical complication of infusion catheter, initial encounter: Secondary | ICD-10-CM | POA: Diagnosis not present

## 2022-11-22 DIAGNOSIS — D638 Anemia in other chronic diseases classified elsewhere: Secondary | ICD-10-CM | POA: Diagnosis not present

## 2022-11-22 DIAGNOSIS — E039 Hypothyroidism, unspecified: Secondary | ICD-10-CM | POA: Diagnosis not present

## 2022-11-22 DIAGNOSIS — F32A Depression, unspecified: Secondary | ICD-10-CM | POA: Insufficient documentation

## 2022-11-22 DIAGNOSIS — F418 Other specified anxiety disorders: Secondary | ICD-10-CM

## 2022-11-22 DIAGNOSIS — M1990 Unspecified osteoarthritis, unspecified site: Secondary | ICD-10-CM | POA: Insufficient documentation

## 2022-11-22 DIAGNOSIS — K219 Gastro-esophageal reflux disease without esophagitis: Secondary | ICD-10-CM | POA: Diagnosis not present

## 2022-11-22 DIAGNOSIS — D0511 Intraductal carcinoma in situ of right breast: Secondary | ICD-10-CM | POA: Insufficient documentation

## 2022-11-22 DIAGNOSIS — D649 Anemia, unspecified: Secondary | ICD-10-CM | POA: Insufficient documentation

## 2022-11-22 HISTORY — PX: PORT A CATH REVISION: SHX6033

## 2022-11-22 SURGERY — PORT A CATH REVISION
Anesthesia: General

## 2022-11-22 MED ORDER — FENTANYL CITRATE (PF) 250 MCG/5ML IJ SOLN
INTRAMUSCULAR | Status: DC | PRN
  Administered 2022-11-22 (×4): 25 ug via INTRAVENOUS

## 2022-11-22 MED ORDER — OXYCODONE HCL 5 MG PO TABS
5.0000 mg | ORAL_TABLET | Freq: Once | ORAL | Status: AC | PRN
  Administered 2022-11-22: 5 mg via ORAL

## 2022-11-22 MED ORDER — HEPARIN SOD (PORK) LOCK FLUSH 100 UNIT/ML IV SOLN
INTRAVENOUS | Status: DC | PRN
  Administered 2022-11-22: 500 [IU] via INTRAVENOUS

## 2022-11-22 MED ORDER — FENTANYL CITRATE (PF) 250 MCG/5ML IJ SOLN
INTRAMUSCULAR | Status: AC
  Filled 2022-11-22: qty 5

## 2022-11-22 MED ORDER — CHLORHEXIDINE GLUCONATE CLOTH 2 % EX PADS: 6.0000 | MEDICATED_PAD | Freq: Once | CUTANEOUS | Status: DC

## 2022-11-22 MED ORDER — PROPOFOL 10 MG/ML IV BOLUS
INTRAVENOUS | Status: DC | PRN
  Administered 2022-11-22: 130 mg via INTRAVENOUS

## 2022-11-22 MED ORDER — LIDOCAINE 2% (20 MG/ML) 5 ML SYRINGE
INTRAMUSCULAR | Status: DC | PRN
  Administered 2022-11-22: 60 mg via INTRAVENOUS

## 2022-11-22 MED ORDER — PROPOFOL 500 MG/50ML IV EMUL
INTRAVENOUS | Status: DC | PRN
  Administered 2022-11-22: 175 ug/kg/min via INTRAVENOUS

## 2022-11-22 MED ORDER — ACETAMINOPHEN 500 MG PO TABS
1000.0000 mg | ORAL_TABLET | Freq: Once | ORAL | Status: AC
  Administered 2022-11-22: 1000 mg via ORAL
  Filled 2022-11-22: qty 2

## 2022-11-22 MED ORDER — DEXAMETHASONE SODIUM PHOSPHATE 10 MG/ML IJ SOLN
INTRAMUSCULAR | Status: AC
  Filled 2022-11-22: qty 2

## 2022-11-22 MED ORDER — DEXAMETHASONE SODIUM PHOSPHATE 10 MG/ML IJ SOLN
INTRAMUSCULAR | Status: DC | PRN
  Administered 2022-11-22: 5 mg via INTRAVENOUS

## 2022-11-22 MED ORDER — HEPARIN 6000 UNIT IRRIGATION SOLUTION
Status: DC | PRN
  Administered 2022-11-22: 1

## 2022-11-22 MED ORDER — HEPARIN SOD (PORK) LOCK FLUSH 100 UNIT/ML IV SOLN
INTRAVENOUS | Status: AC
  Filled 2022-11-22: qty 5

## 2022-11-22 MED ORDER — LACTATED RINGERS IV SOLN: INTRAVENOUS | Status: DC

## 2022-11-22 MED ORDER — LACTATED RINGERS IV SOLN: INTRAVENOUS | Status: DC | PRN

## 2022-11-22 MED ORDER — OXYCODONE HCL 5 MG PO TABS
ORAL_TABLET | ORAL | Status: AC
  Filled 2022-11-22: qty 1

## 2022-11-22 MED ORDER — FENTANYL CITRATE (PF) 100 MCG/2ML IJ SOLN: 25.0000 ug | INTRAMUSCULAR | Status: DC | PRN

## 2022-11-22 MED ORDER — ONDANSETRON HCL 4 MG/2ML IJ SOLN
INTRAMUSCULAR | Status: DC | PRN
  Administered 2022-11-22: 4 mg via INTRAVENOUS

## 2022-11-22 MED ORDER — LIDOCAINE 2% (20 MG/ML) 5 ML SYRINGE
INTRAMUSCULAR | Status: AC
  Filled 2022-11-22: qty 10

## 2022-11-22 MED ORDER — CIPROFLOXACIN IN D5W 400 MG/200ML IV SOLN
400.0000 mg | INTRAVENOUS | Status: AC
  Administered 2022-11-22: 400 mg via INTRAVENOUS
  Filled 2022-11-22: qty 200

## 2022-11-22 MED ORDER — OXYCODONE HCL 5 MG/5ML PO SOLN: 5.0000 mg | Freq: Once | ORAL | Status: AC | PRN

## 2022-11-22 MED ORDER — CHLORHEXIDINE GLUCONATE 0.12 % MT SOLN
OROMUCOSAL | Status: AC
  Administered 2022-11-22: 15 mL
  Filled 2022-11-22: qty 15

## 2022-11-22 MED ORDER — 0.9 % SODIUM CHLORIDE (POUR BTL) OPTIME
TOPICAL | Status: DC | PRN
  Administered 2022-11-22: 1000 mL

## 2022-11-22 MED ORDER — BUPIVACAINE HCL 0.25 % IJ SOLN
INTRAMUSCULAR | Status: DC | PRN
  Administered 2022-11-22: 4 mL

## 2022-11-22 MED ORDER — BUPIVACAINE HCL (PF) 0.25 % IJ SOLN
INTRAMUSCULAR | Status: AC
  Filled 2022-11-22: qty 30

## 2022-11-22 MED ORDER — ONDANSETRON HCL 4 MG/2ML IJ SOLN
INTRAMUSCULAR | Status: AC
  Filled 2022-11-22: qty 4

## 2022-11-22 MED ORDER — HEPARIN 6000 UNIT IRRIGATION SOLUTION
Status: AC
  Filled 2022-11-22: qty 500

## 2022-11-22 MED ORDER — ONDANSETRON HCL 4 MG/2ML IJ SOLN: 4.0000 mg | Freq: Once | INTRAMUSCULAR | Status: DC | PRN

## 2022-11-22 MED ORDER — PROPOFOL 1000 MG/100ML IV EMUL
INTRAVENOUS | Status: AC
  Filled 2022-11-22: qty 100

## 2022-11-22 SURGICAL SUPPLY — 48 items
ADH SKN CLS APL DERMABOND .7 (GAUZE/BANDAGES/DRESSINGS) ×1
APL PRP STRL LF DISP 70% ISPRP (MISCELLANEOUS) ×1
BAG COUNTER SPONGE SURGICOUNT (BAG) ×1 IMPLANT
BAG DECANTER FOR FLEXI CONT (MISCELLANEOUS) ×1 IMPLANT
BAG SPNG CNTER NS LX DISP (BAG) ×1
CHLORAPREP W/TINT 26 (MISCELLANEOUS) ×1 IMPLANT
CLSR STERI-STRIP ANTIMIC 1/2X4 (GAUZE/BANDAGES/DRESSINGS) IMPLANT
COVER PROBE W GEL 5X96 (DRAPES) ×1 IMPLANT
COVER SURGICAL LIGHT HANDLE (MISCELLANEOUS) ×1 IMPLANT
DERMABOND ADVANCED .7 DNX12 (GAUZE/BANDAGES/DRESSINGS) ×1 IMPLANT
DRAPE C-ARM 42X120 X-RAY (DRAPES) ×1 IMPLANT
DRAPE CHEST BREAST 15X10 FENES (DRAPES) ×1 IMPLANT
DRSG TEGADERM 4X4.75 (GAUZE/BANDAGES/DRESSINGS) IMPLANT
ELECT CAUTERY BLADE 6.4 (BLADE) ×1 IMPLANT
ELECT REM PT RETURN 9FT ADLT (ELECTROSURGICAL) ×1
ELECTRODE REM PT RTRN 9FT ADLT (ELECTROSURGICAL) ×1 IMPLANT
GAUZE 4X4 16PLY ~~LOC~~+RFID DBL (SPONGE) ×1 IMPLANT
GAUZE SPONGE 4X4 12PLY STRL (GAUZE/BANDAGES/DRESSINGS) IMPLANT
GEL ULTRASOUND 20GR AQUASONIC (MISCELLANEOUS) ×1 IMPLANT
GLOVE BIO SURGEON STRL SZ7 (GLOVE) ×1 IMPLANT
GLOVE BIOGEL PI IND STRL 6.5 (GLOVE) IMPLANT
GLOVE BIOGEL PI IND STRL 7.5 (GLOVE) ×1 IMPLANT
GLOVE SURG SS PI 7.0 STRL IVOR (GLOVE) IMPLANT
GOWN STRL REUS W/ TWL LRG LVL3 (GOWN DISPOSABLE) ×2 IMPLANT
GOWN STRL REUS W/TWL LRG LVL3 (GOWN DISPOSABLE) ×2
INTRODUCER COOK 11FR (CATHETERS) IMPLANT
KIT BASIN OR (CUSTOM PROCEDURE TRAY) ×1 IMPLANT
KIT PORT POWER 8FR ISP CVUE (Port) ×1 IMPLANT
KIT TURNOVER KIT B (KITS) ×1 IMPLANT
NS IRRIG 1000ML POUR BTL (IV SOLUTION) ×1 IMPLANT
PAD ARMBOARD 7.5X6 YLW CONV (MISCELLANEOUS) ×2 IMPLANT
PENCIL BUTTON HOLSTER BLD 10FT (ELECTRODE) ×1 IMPLANT
POSITIONER HEAD DONUT 9IN (MISCELLANEOUS) ×1 IMPLANT
SET INTRODUCER 12FR PACEMAKER (INTRODUCER) IMPLANT
SET SHEATH INTRODUCER 10FR (MISCELLANEOUS) IMPLANT
SHEATH COOK PEEL AWAY SET 9F (SHEATH) IMPLANT
SPIKE FLUID TRANSFER (MISCELLANEOUS) ×1 IMPLANT
STRIP CLOSURE SKIN 1/2X4 (GAUZE/BANDAGES/DRESSINGS) ×1 IMPLANT
SUT MNCRL AB 4-0 PS2 18 (SUTURE) ×1 IMPLANT
SUT PROLENE 2 0 SH DA (SUTURE) ×1 IMPLANT
SUT SILK 2 0 (SUTURE)
SUT SILK 2-0 18XBRD TIE 12 (SUTURE) IMPLANT
SUT VIC AB 3-0 SH 27 (SUTURE) ×1
SUT VIC AB 3-0 SH 27XBRD (SUTURE) ×1 IMPLANT
SYR 5ML LUER SLIP (SYRINGE) ×1 IMPLANT
TOWEL GREEN STERILE (TOWEL DISPOSABLE) ×1 IMPLANT
TOWEL GREEN STERILE FF (TOWEL DISPOSABLE) ×1 IMPLANT
TRAY LAPAROSCOPIC MC (CUSTOM PROCEDURE TRAY) ×1 IMPLANT

## 2022-11-22 NOTE — H&P (Signed)
Judy Morrison is an 72 y.o. female.   Chief Complaint: breast cancer HPI: 14 yof with screening mammogram that shows c density breast tissue and a right breast distortion just lateral to the nipple anteriorly. There is no [place] correlate. She has an ax [place] that she will get later today. 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 she had lump/sn and port placement which she did well from.  She has first systemic therapy treatment and had some dyspnea. The tip of line from port pulled back to be at upper svc.  Cancer center will not use this due to their policy and so we discussed revision     Past Surgical History:  Procedure Laterality Date   ADENOIDECTOMY     APPENDECTOMY     arthroscopic right knee      BREAST BIOPSY Right 09/01/2022   MM RT BREAST BX W LOC DEV 1ST LESION IMAGE BX SPEC STEREO GUIDE 09/01/2022 [facility]   BREAST BIOPSY  10/01/2022   MM RT RADIOACTIVE SEED LOC MAMMO GUIDE 10/01/2022 [facility]   BREAST LUMPECTOMY WITH RADIOACTIVE SEED AND SENTINEL LYMPH NODE BIOPSY Right 10/05/2022   Procedure: RIGHT BREAST LUMPECTOMY WITH RADIOACTIVE SEED AND AXILLARY SENTINEL LYMPH NODE BIOPSY;  Surgeon: Rolm Bookbinder, MD;  Location: [facility];  Service: General;  Laterality: Right;   CHOLECYSTECTOMY     COLONOSCOPY  06/06/2017   [place]   Pelvic sling     x2   POLYPECTOMY     PORTACATH PLACEMENT Left 10/05/2022   Procedure: INSERTION PORT-A-CATH;  Surgeon: Rolm Bookbinder, MD;  Location: [facility];  Service: General;  Laterality: Left;   ROTATOR CUFF REPAIR Right    TONSILLECTOMY     TOTAL KNEE ARTHROPLASTY Right 08/12/2017   Procedure: RIGHT TOTAL KNEE ARTHROPLASTY;  Surgeon: Sydnee Cabal, MD;  Location: [facility];  Service: Orthopedics;  Laterality: Right;   TOTAL KNEE ARTHROPLASTY Left 01/06/2018   Procedure: LEFT TOTAL KNEE ARTHROPLASTY;  Surgeon:  Sydnee Cabal, MD;  Location: [facility];  Service: Orthopedics;  Laterality: Left;   UPPER GASTROINTESTINAL ENDOSCOPY     WISDOM TOOTH EXTRACTION      Family History  Problem Relation Age of Onset   Bowel Disease Mother        ishemic bowel   Lung cancer Mother        smoked   Cervical cancer Paternal Aunt    Cancer Maternal Grandfather        unknown type   Lung cancer Paternal Grandfather        smoked   Colon cancer Neg Hx    Breast cancer Neg Hx    Esophageal cancer Neg Hx    Pancreatic cancer Neg Hx    Prostate cancer Neg Hx    Rectal cancer Neg Hx    Stomach cancer Neg Hx    Allergic rhinitis Neg Hx    Asthma Neg Hx    Eczema Neg Hx    Urticaria Neg Hx    Colon polyps Neg Hx    Social History:  reports that she quit smoking about 38 years ago. Her smoking use included cigarettes. She has never used smokeless tobacco. She reports current alcohol use of about 2.0 standard drinks of alcohol per week. She reports that she does not use drugs.  Allergies:  Allergies  Allergen Reactions   Penicillin G Anaphylaxis  Cephalexin Other (See Comments)    Pt does not remember reaction    Codeine Nausea Only   Erythromycin Itching   Penicillins Itching, Swelling and Other (See Comments)    Has patient had a PCN reaction causing immediate rash, facial/tongue/throat swelling, SOB or lightheadedness with hypotension: Yes Has patient had a PCN reaction causing severe rash involving mucus membranes or skin necrosis: No Has patient had a PCN reaction that required hospitalization: No Has patient had a PCN reaction occurring within the last 10 years: No If all of the above answers are "NO", then may proceed with Cephalosporin use.     Medications Prior to Admission  Medication Sig Dispense Refill   Adapalene 0.3 % gel Apply 1 application daily as needed topically (for acne).     ALPRAZolam (XANAX) 0.25 MG tablet Take 0.25 mg by mouth daily as needed for anxiety.   1    brexpiprazole (REXULTI) 2 MG TABS tablet Take 2 mg by mouth daily after breakfast.     Cholecalciferol (VITAMIN D3) 1000 units CAPS Take 1,000 Units by mouth in the morning and at bedtime.     clobetasol cream (TEMOVATE) [id number] % Apply 1 Application topically 2 (two) times daily as needed (irritation).     clonazePAM (KLONOPIN) 0.5 MG tablet Take 0.25 mg by mouth 2 (two) times daily.     clotrimazole (MYCELEX) 10 MG troche Take 10 mg by mouth as directed. 2 times per week     DULoxetine (CYMBALTA) 20 MG capsule Take 20 mg by mouth daily after breakfast.  1   hydrOXYzine (VISTARIL) 25 MG capsule Take 25-50 mg by mouth daily.     latanoprost (XALATAN) 0.005 % ophthalmic solution Place 1 drop into both eyes at bedtime.     levothyroxine (SYNTHROID, LEVOTHROID) 50 MCG tablet Take 50 mcg at bedtime by mouth.      lidocaine-prilocaine (EMLA) cream Apply 1 Application to affected area topically once daily. (Patient taking differently: Apply 1 Application topically daily as needed (port access before chemo).) 30 g 3   loratadine (CLARITIN) 10 MG tablet Take 10 mg by mouth daily.     Multiple Vitamins-Minerals (HAIR SKIN & NAILS ADVANCED PO) Take 1 tablet by mouth daily.     nortriptyline (PAMELOR) 50 MG capsule Take 100 mg at bedtime by mouth.      omeprazole (PRILOSEC) 40 MG capsule Take 40 mg by mouth daily before breakfast.     ondansetron (ZOFRAN) 8 MG tablet Take 1 tablet (8 mg total) by mouth every 8 (eight) hours as needed for nausea or vomiting. 30 tablet 1   OVER THE COUNTER MEDICATION 20 mLs by Mouth Rinse route 4 (four) times daily as needed (mouth pain). Oral-B Sore Mouth Rinse     simvastatin (ZOCOR) 10 MG tablet Take 10 mg by mouth daily.     tacrolimus (PROGRAF) 1 MG capsule Take 1 mg by mouth 2 (two) times daily. Per patient - Dissolve capsule in one half liter of H2O. Swish and spit twice daily     triamcinolone cream (KENALOG) 0.1 % Apply 1 Application topically 2 (two) times daily as  needed (irritation).     prochlorperazine (COMPAZINE) 10 MG tablet Take 1 tablet (10 mg total) by mouth every 6 (six) hours as needed for nausea or vomiting. 30 tablet 1   valACYclovir (VALTREX) 1000 MG tablet Take 1,000 mg by mouth daily as needed (outbreaks).      No results found for this or any previous visit (  from the past 48 hour(s)). No results found.  Review of Systems  All other systems reviewed and are negative.   Blood pressure 138/84, pulse 89, temperature 97.9 F (36.6 C), temperature source Oral, resp. rate 16, height '5\' 3"'$  (1.6 m), weight 74.8 kg, SpO2 98 %. Physical Exam Constitutional:      Appearance: Normal appearance.  Cardiovascular:     Rate and Rhythm: Normal rate.  Pulmonary:     Effort: Pulmonary effort is normal.  Neurological:     Mental Status: She is alert.      Assessment/Plan Port revision -will revise due to policy although works fine  Rolm Bookbinder, MD 11/22/2022, 11:40 AM

## 2022-11-22 NOTE — Transfer of Care (Signed)
Immediate Anesthesia Transfer of Care Note  Patient: Judy Morrison  Procedure(s) Performed: PORT A CATH REVISION  Patient Location: PACU  Anesthesia Type:General  Level of Consciousness: drowsy and patient cooperative  Airway & Oxygen Therapy: Patient Spontanous Breathing  Post-op Assessment: Report given to RN and Post -op Vital signs reviewed and stable  Post vital signs: Reviewed and stable  Last Vitals:  Vitals Value Taken Time  BP 141/79 11/22/22 1245  Temp    Pulse 86 11/22/22 1245  Resp 14 11/22/22 1245  SpO2 93 % 11/22/22 1245  Vitals shown include unvalidated device data.  Last Pain:  Vitals:   11/22/22 1101  TempSrc:   PainSc: 0-No pain         Complications: No notable events documented.

## 2022-11-22 NOTE — Discharge Instructions (Signed)
PORT-A-CATH: POST OP INSTRUCTIONS  Always review your discharge instruction sheet given to you by the facility where your surgery was performed.   A prescription for pain medication may be given to you upon discharge. Take your pain medication as prescribed, if needed. If narcotic pain medicine is not needed, then you make take acetaminophen (Tylenol) or ibuprofen (Advil) as needed.  Take your usually prescribed medications unless otherwise directed. If you need a refill on your pain medication, please contact our office. All narcotic pain medicine now requires a paper prescription.  Phoned in and fax refills are no longer allowed by law.  Prescriptions will not be filled after 5 pm or on weekends.  You should follow a light diet for the remainder of the day after your procedure. Most patients will experience some mild swelling and/or bruising in the area of the incision. It may take several days to resolve. It is common to experience some constipation if taking pain medication after surgery. Increasing fluid intake and taking a stool softener (such as Colace) will usually help or prevent this problem from occurring. A mild laxative (Milk of Magnesia or Miralax) should be taken according to package directions if there are no bowel movements after 48 hours.  Unless discharge instructions indicate otherwise, you may remove your bandages 48 hours after surgery, and you may shower at that time. You may have steri-strips (small white skin tapes) in place directly over the incision.  These strips should be left on the skin for 7-10 days.  If your surgeon used Dermabond (skin glue) on the incision, you may shower in 24 hours.  The glue will flake off over the next 2-3 weeks.  If your port is left accessed at the end of surgery (needle left in port), the dressing cannot get wet and should only by changed by a healthcare professional. When the port is no longer accessed (when the needle has been removed), follow  step 7.   ACTIVITIES:  Limit activity involving your arms for the next 72 hours. Do no strenuous exercise or activity for 1 week. You may drive when you are no longer taking prescription pain medication, you can comfortably wear a seatbelt, and you can maneuver your car. 10.You may need to see your doctor in the office for a follow-up appointment.  Please       check with your doctor.  11.When you receive a new Port-a-Cath, you will get a product guide and        ID card.  Please keep them in case you need them.  WHEN TO CALL YOUR DOCTOR ([phone]): Fever over 101.0 Chills Continued bleeding from incision Increased redness and tenderness at the site Shortness of breath, difficulty breathing   The clinic staff is available to answer your questions during regular business hours. Please don't hesitate to call and ask to speak to one of the nurses or medical assistants for clinical concerns. If you have a medical emergency, go to the nearest emergency room or call 911.  A surgeon from [facility] is always on call at the hospital.     For further information, please visit [email or web address]      

## 2022-11-22 NOTE — Anesthesia Postprocedure Evaluation (Signed)
Anesthesia Post Note  Patient: Judy Morrison  Procedure(s) Performed: PORT A CATH REVISION     Patient location during evaluation: PACU Anesthesia Type: General Level of consciousness: awake and alert Pain management: pain level controlled Vital Signs Assessment: post-procedure vital signs reviewed and stable Respiratory status: spontaneous breathing, nonlabored ventilation and respiratory function stable Cardiovascular status: stable and blood pressure returned to baseline Anesthetic complications: no   No notable events documented.  Last Vitals:  Vitals:   11/22/22 1315 11/22/22 1330  BP: (!) 148/82 131/80  Pulse: 82 81  Resp: 17 16  Temp:    SpO2: 95% 95%    Last Pain:  Vitals:   11/22/22 1315  TempSrc:   PainSc: 0-No pain                 Audry Pili

## 2022-11-22 NOTE — Anesthesia Procedure Notes (Signed)
Procedure Name: LMA Insertion Date/Time: 11/22/2022 11:55 AM  Performed by: Thelma Comp, CRNAPre-anesthesia Checklist: Patient identified, Emergency Drugs available, Suction available and Patient being monitored Patient Re-evaluated:Patient Re-evaluated prior to induction Oxygen Delivery Method: Circle System Utilized Preoxygenation: Pre-oxygenation with 100% oxygen Induction Type: IV induction Ventilation: Mask ventilation without difficulty LMA: LMA inserted LMA Size: 4.0 Number of attempts: 1 Placement Confirmation: positive ETCO2 Tube secured with: Tape Dental Injury: Teeth and Oropharynx as per pre-operative assessment

## 2022-11-22 NOTE — Anesthesia Preprocedure Evaluation (Addendum)
Anesthesia Evaluation  Patient identified by MRN, date of birth, ID band Patient awake    Reviewed: Allergy & Precautions, NPO status , Patient's Chart, lab work & pertinent test results  History of Anesthesia Complications Negative for: history of anesthetic complications  Airway Mallampati: II  TM Distance: >3 FB Neck ROM: Full    Dental  (+) Dental Advisory Given   Pulmonary former smoker   Pulmonary exam normal        Cardiovascular negative cardio ROS Normal cardiovascular exam   '24 TTE - Normal EF. Mild AI    Neuro/Psych  PSYCHIATRIC DISORDERS Anxiety Depression    negative neurological ROS     GI/Hepatic Neg liver ROS,GERD  Medicated and Controlled,,  Endo/Other  Hypothyroidism    Renal/GU negative Renal ROS     Musculoskeletal  (+) Arthritis ,    Abdominal   Peds  Hematology  (+) Blood dyscrasia, anemia   Anesthesia Other Findings   Reproductive/Obstetrics  Breast cancer                              Anesthesia Physical Anesthesia Plan  ASA: 3  Anesthesia Plan: General   Post-op Pain Management: Tylenol PO (pre-op)*   Induction: Intravenous  PONV Risk Score and Plan: 3 and Treatment may vary due to age or medical condition, Ondansetron and Dexamethasone  Airway Management Planned: LMA  Additional Equipment: None  Intra-op Plan:   Post-operative Plan: Extubation in OR  Informed Consent: I have reviewed the patients History and Physical, chart, labs and discussed the procedure including the risks, benefits and alternatives for the proposed anesthesia with the patient or authorized representative who has indicated his/her understanding and acceptance.     Dental advisory given  Plan Discussed with: CRNA and Anesthesiologist  Anesthesia Plan Comments:         Anesthesia Quick Evaluation

## 2022-11-22 NOTE — Op Note (Signed)
Preoperative diagnosis: Functional port but unable to use due to policy. Postoperative diagnosis: Same as above Procedure: Left internal jugular port revision Surgeon: Dr. Serita Grammes Anesthesia: General Complication: None Drains: Specimens: None Special count was correct completion Decision to recovery stable condition  Indications: This is 72 year old female who has undergone treatment for breast cancer.  She is now beginning systemic therapy.  I placed a left internal jugular port previously.  The tip of this was pulled back to be in the upper SVC and brachiocephalic confluence.  Due to policy they will not use this and this I had to go back and placed this deeper again.  Procedure: After informed consent was obtained she was taken to the operating.  She was given antibiotics.  SCDs were in place.  She was placed under general anesthesia without complication.  She was prepped and draped in standard sterile surgical fashion.  Surgical timeout was then performed.  I fulgurated Marcaine at the site of her port as well as the site in her neck where the line was.  I then disconnected the line from the port and pulled this up through the neck.  Under fluoroscopy I then placed a wire through the line.  This was confirmed to be in good position.  I then remove the old line.  I placed a new line pver the wire and then removed the wire.  The tip was brought back to be at the cavoatrial junction.  It is sitting there or a little bit into the right atrium at this point. I left this deep so it does not flip back again.  I then brought this back to the port.  I connected this.  This aspirated blood and flushed easily.  I then closed these with 3-0 Vicryl and 4-0 Monocryl.  Glue were placed on these.  I accessed the port.  This flushed easily and aspirated blood.  I placed heparin in it.  I left that access for chemotherapy tomorrow places are distressing.  She tolerated this well was transferred recovery  stable.

## 2022-11-22 NOTE — Interval H&P Note (Signed)
History and Physical Interval Note:  11/22/2022 11:41 AM  [place]  has presented today for surgery, with the diagnosis of BREAST CANCER.  The various methods of treatment have been discussed with the patient and family. After consideration of risks, benefits and other options for treatment, the patient has consented to  Procedure(s): PORT A CATH REVISION (N/A) as a surgical intervention.  The patient's history has been reviewed, patient examined, no change in status, stable for surgery.  I have reviewed the patient's chart and labs.  Questions were answered to the patient's satisfaction.     Rolm Bookbinder

## 2022-11-23 ENCOUNTER — Encounter (HOSPITAL_COMMUNITY): Payer: Self-pay | Admitting: General Surgery

## 2022-11-23 ENCOUNTER — Inpatient Hospital Stay (HOSPITAL_BASED_OUTPATIENT_CLINIC_OR_DEPARTMENT_OTHER): Payer: Medicare Other | Admitting: Hematology and Oncology

## 2022-11-23 ENCOUNTER — Inpatient Hospital Stay: Payer: Medicare Other

## 2022-11-23 ENCOUNTER — Other Ambulatory Visit: Payer: Self-pay

## 2022-11-23 ENCOUNTER — Other Ambulatory Visit: Payer: Medicare Other

## 2022-11-23 VITALS — BP 139/81 | HR 87 | Temp 98.2°F | Resp 19

## 2022-11-23 VITALS — BP 119/70 | HR 95 | Temp 98.1°F | Resp 16 | Ht 63.0 in | Wt 166.3 lb

## 2022-11-23 DIAGNOSIS — C50411 Malignant neoplasm of upper-outer quadrant of right female breast: Secondary | ICD-10-CM

## 2022-11-23 DIAGNOSIS — Z170 Estrogen receptor positive status [ER+]: Secondary | ICD-10-CM

## 2022-11-23 DIAGNOSIS — Z5111 Encounter for antineoplastic chemotherapy: Secondary | ICD-10-CM | POA: Diagnosis not present

## 2022-11-23 DIAGNOSIS — Z95828 Presence of other vascular implants and grafts: Secondary | ICD-10-CM

## 2022-11-23 LAB — CMP (CANCER CENTER ONLY)
ALT: 15 U/L (ref 0–44)
AST: 11 U/L — ABNORMAL LOW (ref 15–41)
Albumin: 4 g/dL (ref 3.5–5.0)
Alkaline Phosphatase: 84 U/L (ref 38–126)
Anion gap: 5 (ref 5–15)
BUN: 12 mg/dL (ref 8–23)
CO2: 29 mmol/L (ref 22–32)
Calcium: 9.1 mg/dL (ref 8.9–10.3)
Chloride: 101 mmol/L (ref 98–111)
Creatinine: 0.75 mg/dL (ref 0.44–1.00)
GFR, Estimated: >60 mL/min (ref >60)
Glucose, Bld: 85 mg/dL (ref 70–99)
Potassium: 3.6 mmol/L (ref 3.5–5.1)
Sodium: 135 mmol/L (ref 135–145)
Total Bilirubin: 0.3 mg/dL (ref 0.3–1.2)
Total Protein: 6.4 g/dL — ABNORMAL LOW (ref 6.5–8.1)

## 2022-11-23 LAB — CBC WITH DIFFERENTIAL (CANCER CENTER ONLY)
Abs Immature Granulocytes: 0.06 10*3/uL (ref 0.00–0.07)
Basophils Absolute: 0 10*3/uL (ref 0.0–0.1)
Basophils Relative: 1 %
Eosinophils Absolute: 0 10*3/uL (ref 0.0–0.5)
Eosinophils Relative: 1 %
HCT: 29.8 % — ABNORMAL LOW (ref 36.0–46.0)
Hemoglobin: 10 g/dL — ABNORMAL LOW (ref 12.0–15.0)
Immature Granulocytes: 1 %
Lymphocytes Relative: 19 %
Lymphs Abs: 1.3 10*3/uL (ref 0.7–4.0)
MCH: 27.1 pg (ref 26.0–34.0)
MCHC: 33.6 g/dL (ref 30.0–36.0)
MCV: 80.8 fL (ref 80.0–100.0)
Monocytes Absolute: 0.8 10*3/uL (ref 0.1–1.0)
Monocytes Relative: 12 %
Neutro Abs: 4.8 10*3/uL (ref 1.7–7.7)
Neutrophils Relative %: 66 %
Platelet Count: 375 10*3/uL (ref 150–400)
RBC: 3.69 MIL/uL — ABNORMAL LOW (ref 3.87–5.11)
RDW: 14.9 % (ref 11.5–15.5)
WBC Count: 7.1 10*3/uL (ref 4.0–10.5)
nRBC: 0 % (ref 0.0–0.2)

## 2022-11-23 MED ORDER — FAMOTIDINE IN NACL 20-0.9 MG/50ML-% IV SOLN
20.0000 mg | Freq: Once | INTRAVENOUS | Status: AC
  Administered 2022-11-23: 20 mg via INTRAVENOUS
  Filled 2022-11-23: qty 50

## 2022-11-23 MED ORDER — ACETAMINOPHEN 325 MG PO TABS
650.0000 mg | ORAL_TABLET | Freq: Once | ORAL | Status: AC
  Administered 2022-11-23: 650 mg via ORAL
  Filled 2022-11-23: qty 2

## 2022-11-23 MED ORDER — SODIUM CHLORIDE 0.9% FLUSH
10.0000 mL | Freq: Once | INTRAVENOUS | Status: AC
  Administered 2022-11-23: 10 mL

## 2022-11-23 MED ORDER — SODIUM CHLORIDE 0.9 % IV SOLN
10.0000 mg | Freq: Once | INTRAVENOUS | Status: AC
  Administered 2022-11-23: 10 mg via INTRAVENOUS
  Filled 2022-11-23: qty 10

## 2022-11-23 MED ORDER — DIPHENHYDRAMINE HCL 50 MG/ML IJ SOLN
50.0000 mg | Freq: Once | INTRAMUSCULAR | Status: AC
  Administered 2022-11-23: 50 mg via INTRAVENOUS
  Filled 2022-11-23: qty 1

## 2022-11-23 MED ORDER — SODIUM CHLORIDE 0.9% FLUSH
10.0000 mL | INTRAVENOUS | Status: DC | PRN
  Administered 2022-11-23: 10 mL

## 2022-11-23 MED ORDER — SODIUM CHLORIDE 0.9 % IV SOLN
80.0000 mg/m2 | Freq: Once | INTRAVENOUS | Status: AC
  Administered 2022-11-23: 144 mg via INTRAVENOUS
  Filled 2022-11-23: qty 24

## 2022-11-23 MED ORDER — SODIUM CHLORIDE 0.9 % IV SOLN: Freq: Once | INTRAVENOUS | Status: AC

## 2022-11-23 MED ORDER — TRASTUZUMAB-DTTB CHEMO 150 MG IV SOLR
2.0000 mg/kg | Freq: Once | INTRAVENOUS | Status: AC
  Administered 2022-11-23: 150 mg via INTRAVENOUS
  Filled 2022-11-23: qty 7.14

## 2022-11-23 MED ORDER — HEPARIN SOD (PORK) LOCK FLUSH 100 UNIT/ML IV SOLN
500.0000 [IU] | Freq: Once | INTRAVENOUS | Status: AC | PRN
  Administered 2022-11-23: 500 [IU]

## 2022-11-23 NOTE — Patient Instructions (Signed)
[facility]  Discharge Instructions: Thank you for choosing [facility] to provide your oncology and hematology care.   If you have a lab appointment with the Cancer Center, please go directly to the Cancer Center and check in at the registration area.   Wear comfortable clothing and clothing appropriate for easy access to any Portacath or PICC line.   We strive to give you quality time with your provider. You may need to reschedule your appointment if you arrive late (15 or more minutes).  Arriving late affects you and other patients whose appointments are after yours.  Also, if you miss three or more appointments without notifying the office, you may be dismissed from the clinic at the provider's discretion.      For prescription refill requests, have your pharmacy contact our office and allow 72 hours for refills to be completed.    Today you received the following chemotherapy and/or immunotherapy agents: Trastuzumab, Paclitaxel.       To help prevent nausea and vomiting after your treatment, we encourage you to take your nausea medication as directed.  BELOW ARE SYMPTOMS THAT SHOULD BE REPORTED IMMEDIATELY: *FEVER GREATER THAN 100.4 F (38 C) OR HIGHER *CHILLS OR SWEATING *NAUSEA AND VOMITING THAT IS NOT CONTROLLED WITH YOUR NAUSEA MEDICATION *UNUSUAL SHORTNESS OF BREATH *UNUSUAL BRUISING OR BLEEDING *URINARY PROBLEMS (pain or burning when urinating, or frequent urination) *BOWEL PROBLEMS (unusual diarrhea, constipation, pain near the anus) TENDERNESS IN MOUTH AND THROAT WITH OR WITHOUT PRESENCE OF ULCERS (sore throat, sores in mouth, or a toothache) UNUSUAL RASH, SWELLING OR PAIN  UNUSUAL VAGINAL DISCHARGE OR ITCHING   Items with * indicate a potential emergency and should be followed up as soon as possible or go to the Emergency Department if any problems should occur.  Please show the CHEMOTHERAPY ALERT CARD or IMMUNOTHERAPY  ALERT CARD at check-in to the Emergency Department and triage nurse.  Should you have questions after your visit or need to cancel or reschedule your appointment, please contact [facility]  Dept: [phone]  and follow the prompts.  Office hours are 8:00 a.m. to 4:30 p.m. Monday - Friday. Please note that voicemails left after 4:00 p.m. may not be returned until the following business day.  We are closed weekends and major holidays. You have access to a nurse at all times for urgent questions. Please call the main number to the clinic Dept: [phone] and follow the prompts.   For any non-urgent questions, you may also contact your provider using MyChart. We now offer e-Visits for anyone 18 and older to request care online for non-urgent symptoms. For details visit [email or web address].   Also download the MyChart app! Go to the app store, search "MyChart", open the app, select [facility], and log in with your MyChart username and password.   

## 2022-11-23 NOTE — Progress Notes (Signed)
[facility] Follow up:    Medicine, [employer] [id number]   DIAGNOSIS:  Cancer Staging  Malignant neoplasm of upper-outer quadrant of right breast in female, estrogen receptor positive (Judy Morrison) Staging form: Breast, AJCC 8th Edition - Clinical stage from 09/15/2022: Stage IA (cT1c, cN0, cM0, G2, ER+, PR+, HER2+) - Unsigned Stage prefix: Initial diagnosis Histologic grading system: 3 grade system Laterality: Right Staged by: Pathologist and managing physician Stage used in treatment planning: Yes National guidelines used in treatment planning: Yes Type of national guideline used in treatment planning: NCCN - Pathologic stage from 10/05/2022: Stage IA (pT1c, pN0, cM0, G2, ER+, PR+, HER2+) - Signed by Gardenia Phlegm, NP on 11/08/2022 Stage prefix: Initial diagnosis Histologic grading system: 3 grade system   SUMMARY OF ONCOLOGIC HISTORY: Oncology History  Malignant neoplasm of upper-outer quadrant of right breast in female, estrogen receptor positive ([place])  08/02/2022 Mammogram   In the right breast, possible distortion warrants further evaluation. In the left breast, no findings suspicious for malignancy. Diagnostic mammogram showed persistent subtle distortion central slightly LATERAL aspect of the RIGHT breast warranting tissue diagnosis.     09/01/2022 Pathology Results   Final pathology showed grade 2 invasive mammary carcinoma, prognostic showed ER 90% positive strong staining PR 90% positive strong staining, Ki-67 of 5% and HER2 3+ by [facility]   09/10/2022 Initial Diagnosis   Malignant neoplasm of upper-outer quadrant of right breast in female, estrogen receptor positive ([place])    Genetic Testing   [facility] CancerNext-Expanded Panel+RNA was Positive. 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  Report date is 10/04/2022.  The CancerNext-Expanded gene panel offered by [facility] and includes sequencing, rearrangement, and RNA analysis for the following 77 genes: AIP, ALK, APC, ATM, AXIN2, BAP1, BARD1, BLM, BMPR1A, BRCA1, BRCA2, BRIP1, CDC73, CDH1, CDK4, CDKN1B, CDKN2A, CHEK2, CTNNA1, DICER1, FANCC, FH, FLCN, GALNT12, KIF1B, LZTR1, MAX, MEN1, MET, MLH1, MSH2, MSH3, MSH6, MUTYH, NBN, NF1, NF2, NTHL1, PALB2, PHOX2B, PMS2, POT1, PRKAR1A, PTCH1, PTEN, RAD51C, RAD51D, RB1, RECQL, RET, SDHA, SDHAF2, SDHB, SDHC, SDHD, SMAD4, SMARCA4, SMARCB1, SMARCE1, STK11, SUFU, TMEM127, TP53, TSC1, TSC2, VHL and XRCC2 (sequencing and deletion/duplication); EGFR, EGLN1, HOXB13, KIT, MITF, PDGFRA, POLD1, and POLE (sequencing only); EPCAM and GREM1 (deletion/duplication only).    10/05/2022 Surgery   Right breast lumpectomy with Dr. 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  Grade 2, 2 sentinel lymph nodes negative for metastasis.  Margins negative.  T1c, N0.   10/05/2022 Cancer Staging   Staging form: Breast, AJCC 8th Edition - Pathologic stage from 10/05/2022: Stage IA (pT1c, pN0, cM0, G2, ER+, PR+, HER2+) - Signed by Gardenia Phlegm, NP on 11/08/2022 Stage prefix: Initial diagnosis Histologic grading system: 3 grade system   11/08/2022 -  Chemotherapy   Patient is on Treatment Plan : BREAST Paclitaxel + Trastuzumab q7d / Trastuzumab q21d       CURRENT THERAPY: Taxol/Herceptin  INTERVAL HISTORY:  Judy Morrison 72 y.o. female returns for follow-up prior to receiving her third weekly cycle of Taxol and Herceptin.  She tells me that she has noted some shortness of breath but will really wonders if this is related to her anxiety.  She has been taking some Xanax which has been helpful.  She also follows up with her psychiatrist tomorrow and is going to talk about the situational anxiety.  Besides anxiety and related shortness of breath, she had revision of the port  placement.  She denies any nausea, vomiting, diarrhea or neuropathy.  She overall has been tolerating chemotherapy really well.   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   Malignant neoplasm of upper-outer quadrant of right breast in female, estrogen receptor positive ([place]) 09/10/2022   Rash and other nonspecific skin eruption 09/15/2020   Allergic contact dermatitis 09/15/2020   Pruritus 08/14/2020   Dizziness 05/10/2018   Atypical chest pain 05/10/2018   Primary osteoarthritis of left knee 01/06/2018   S/P knee replacement 08/12/2017   Incomplete tear of right rotator cuff 05/19/2017   Osteopenia 05/19/2017   Urge incontinence of urine 05/19/2017   Primary open angle glaucoma (POAG) 12/30/2016   Primary osteoarthritis of both knees 12/30/2016   Recurrent oral herpes simplex infection 12/30/2016   Tubular adenoma of colon 12/30/2016   Hypothyroidism 06/25/2016   Right foot pain 12/19/2014   Anxiety 05/21/2011   Seasonal allergies 05/21/2011    is allergic to penicillin g, cephalexin, codeine, erythromycin, and penicillins.  MEDICAL HISTORY: Past Medical History:  Diagnosis Date   Anemia    Anxiety    Arthritis    Depressed    Dermatitis    rare form, on prednisone   GERD (gastroesophageal reflux disease)    controlled with Omeprazole   Glaucoma    Torn rotator cuff    RIGHT    Urinary tract infection    dx 05-26-17 took 1 week cipro BID completed 06-02-17.    SURGICAL HISTORY: Past Surgical History:  Procedure Laterality Date   ADENOIDECTOMY     APPENDECTOMY     arthroscopic right knee      BREAST BIOPSY Right 09/01/2022   MM RT BREAST BX W LOC DEV 1ST LESION IMAGE BX SPEC STEREO GUIDE 09/01/2022 [facility]   BREAST BIOPSY  10/01/2022   MM RT RADIOACTIVE SEED LOC MAMMO GUIDE 10/01/2022 [facility]   BREAST LUMPECTOMY WITH  RADIOACTIVE SEED AND SENTINEL LYMPH NODE BIOPSY Right 10/05/2022   Procedure: RIGHT BREAST LUMPECTOMY WITH RADIOACTIVE SEED AND AXILLARY SENTINEL LYMPH NODE BIOPSY;  Surgeon: Rolm Bookbinder, MD;  Location: [place];  Service: General;  Laterality: Right;   CHOLECYSTECTOMY     COLONOSCOPY  06/06/2017   [place]   Pelvic sling     x2   POLYPECTOMY     PORTACATH PLACEMENT Left 10/05/2022   Procedure: INSERTION PORT-A-CATH;  Surgeon: Rolm Bookbinder, MD;  Location: [place];  Service: General;  Laterality: Left;   ROTATOR CUFF REPAIR Right    TONSILLECTOMY     TOTAL KNEE ARTHROPLASTY Right 08/12/2017   Procedure: RIGHT TOTAL KNEE ARTHROPLASTY;  Surgeon: Sydnee Cabal, MD;  Location: [facility];  Service: Orthopedics;  Laterality: Right;   TOTAL KNEE ARTHROPLASTY Left 01/06/2018   Procedure: LEFT TOTAL KNEE ARTHROPLASTY;  Surgeon: Sydnee Cabal, MD;  Location: [facility];  Service: Orthopedics;  Laterality: Left;   UPPER GASTROINTESTINAL ENDOSCOPY     WISDOM TOOTH EXTRACTION      SOCIAL HISTORY: Social History   Socioeconomic History   Marital status: Single    Spouse name: Not on file   Number of children: Not on file   Years of education: Not on file   Highest education level: Not on file  Occupational History   Not on file  Tobacco Use   Smoking status: Former    Types: Cigarettes    Quit date: 1986    Years since quitting: 38.2   Smokeless tobacco: Never  Vaping Use   Vaping Use: Never used  Substance and Sexual Activity   Alcohol use: Yes    Alcohol/week: 2.0 standard drinks of alcohol    Types: 2 Cans of beer per week   Drug use: No   Sexual activity: Not on file  Other Topics Concern   Not on file  Social History Narrative   Not on file   Social Determinants of Health   Financial Resource Strain: Not on file  Food Insecurity: No Food Insecurity (09/15/2022)   Hunger Vital Sign    Worried About Running Out of Food in the Last  Year: Never true    Ran Out of Food in the Last Year: Never true  Transportation Needs: No Transportation Needs (09/15/2022)   PRAPARE - Hydrologist (Medical): No    Lack of Transportation (Non-Medical): No  Physical Activity: Not on file  Stress: Not on file  Social Connections: Not on file  Intimate Partner Violence: Not on file    FAMILY HISTORY: Family History  Problem Relation Age of Onset   Bowel Disease Mother        ishemic bowel   Lung cancer Mother        smoked   Cervical cancer Paternal Aunt    Cancer Maternal Grandfather        unknown type   Lung cancer Paternal Grandfather        smoked   Colon cancer Neg Hx    Breast cancer Neg Hx    Esophageal cancer Neg Hx    Pancreatic cancer Neg Hx    Prostate cancer Neg Hx    Rectal cancer Neg Hx    Stomach cancer Neg Hx    Allergic rhinitis Neg Hx    Asthma Neg Hx    Eczema Neg Hx    Urticaria Neg Hx    Colon polyps Neg Hx     Review of Systems  Constitutional:  Negative for appetite change, chills, fatigue, fever and unexpected weight change.  HENT:   Negative for hearing loss, lump/mass and trouble swallowing.   Eyes:  Negative for eye problems and icterus.  Respiratory:  Negative for chest tightness, cough and shortness of breath.   Cardiovascular:  Negative for chest pain, leg swelling and palpitations.  Gastrointestinal:  Negative for abdominal distention, abdominal pain, constipation, diarrhea, nausea and vomiting.  Endocrine: Negative for hot flashes.  Genitourinary:  Negative for difficulty urinating.   Musculoskeletal:  Negative for arthralgias.  Skin:  Negative for itching and rash.  Neurological:  Negative for dizziness, extremity weakness, headaches and numbness.  Hematological:  Negative for adenopathy. Does not bruise/bleed easily.  Psychiatric/Behavioral:  Negative for depression. The patient is not nervous/anxious.       PHYSICAL EXAMINATION  ECOG PERFORMANCE  STATUS: 1 - Symptomatic but completely ambulatory  Vitals:   11/23/22 1147  BP: 119/70  Pulse: 95  Resp: 16  Temp: 98.1 F (36.7 C)  SpO2: 100%    Physical Exam Constitutional:      General: She is not in acute distress.    Appearance: Normal appearance. She is not toxic-appearing.  HENT:     Head: Normocephalic and atraumatic.  Eyes:     General: No scleral icterus. Cardiovascular:     Rate and Rhythm:  Normal rate and regular rhythm.     Pulses: Normal pulses.     Heart sounds: Normal heart sounds.  Pulmonary:     Effort: Pulmonary effort is normal.     Breath sounds: Normal breath sounds.  Abdominal:     General: Abdomen is flat. Bowel sounds are normal. There is no distension.     Palpations: Abdomen is soft.     Tenderness: There is no abdominal tenderness.  Musculoskeletal:        General: No swelling.     Cervical back: Neck supple.  Lymphadenopathy:     Cervical: No cervical adenopathy.  Skin:    General: Skin is warm and dry.     Findings: No rash.  Neurological:     General: No focal deficit present.     Mental Status: She is alert.  Psychiatric:        Mood and Affect: Mood normal.        Behavior: Behavior normal.     LABORATORY DATA:  CBC    Component Value Date/Time   WBC 7.1 11/23/2022 1118   WBC 10.1 10/01/2021 1314   RBC 3.69 (L) 11/23/2022 1118   HGB 10.0 (L) 11/23/2022 1118   HGB 13.7 09/15/2020 1209   HCT 29.8 (L) 11/23/2022 1118   HCT 41.3 09/15/2020 1209   PLT 375 11/23/2022 1118   PLT 349 09/15/2020 1209   MCV 80.8 11/23/2022 1118   MCV 86 09/15/2020 1209   MCH 27.1 11/23/2022 1118   MCHC 33.6 11/23/2022 1118   RDW 14.9 11/23/2022 1118   RDW 12.3 09/15/2020 1209   LYMPHSABS 1.3 11/23/2022 1118   LYMPHSABS 1.2 09/15/2020 1209   MONOABS 0.8 11/23/2022 1118   EOSABS 0.0 11/23/2022 1118   EOSABS 0.2 09/15/2020 1209   BASOSABS 0.0 11/23/2022 1118   BASOSABS 0.1 09/15/2020 1209    CMP     Component Value Date/Time   NA  136 11/15/2022 1324   NA 135 09/15/2020 1209   K 4.0 11/15/2022 1324   CL 101 11/15/2022 1324   CO2 30 11/15/2022 1324   GLUCOSE 115 (H) 11/15/2022 1324   BUN 11 11/15/2022 1324   BUN 14 09/15/2020 1209   CREATININE 0.88 11/15/2022 1324   CALCIUM 8.9 11/15/2022 1324   PROT 6.4 (L) 11/15/2022 1324   PROT 6.8 09/15/2020 1209   ALBUMIN 4.0 11/15/2022 1324   ALBUMIN 4.6 09/15/2020 1209   AST 16 11/15/2022 1324   ALT 18 11/15/2022 1324   ALKPHOS 95 11/15/2022 1324   BILITOT 0.3 11/15/2022 1324   GFRNONAA >60 11/15/2022 1324   GFRAA 74 09/15/2020 1209    ASSESSMENT and THERAPY PLAN:   No problem-specific Assessment & Plan notes found for this encounter.   All questions were answered. The patient knows to call the clinic with any problems, questions or concerns. We can certainly see the patient much sooner if necessary.  Total encounter time:20 minutes*in face-to-face visit time, chart review, lab review, care coordination, order entry, and documentation of the encounter time.  *Total Encounter Time as defined by the Centers for Medicare and Medicaid Services includes, in addition to the face-to-face time of a patient visit (documented in the note above) non-face-to-face time: obtaining and reviewing outside history, ordering and reviewing medications, tests or procedures, care coordination (communications with other health care professionals or caregivers) and documentation in the medical record.

## 2022-11-23 NOTE — Research (Signed)
S2205, ICE COMPRESS: RANDOMIZED TRIAL OF LIMB CRYOCOMPRESSION VERSUS CONTINUOUS COMPRESSION VERSUS LOW CYCLIC COMPRESSION FOR THE PREVENTION  OF TAXANE-INDUCED PERIPHERAL NEUROPATHY  Met with patient in waiting room to ensure her visit is going well and to let her know I'll be ready when she is. She is experiencing anxiety and does not feel like she is able to continue the study intervention. She is very willing to continue testing/PROs in line with study timeframes (none are due today).  Withdrawal of Treatment Consent form completed.  Provided patient card with my contact information, as well as a reminder that we will do her 6 week neuro assessments/PROs at her 12/19/21 visit.  Judy Morrison Dhiya Smits, RN, BSN, [facility] She  Her  Hers Clinical Research Nurse [facility] Direct Dial [phone]  Pager [phone] 11/23/2022 1:21 PM

## 2022-11-23 NOTE — Assessment & Plan Note (Signed)
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 referred to breast [place] for additional recommendations. On the original biopsy, the tumor measured about 10 mm.  She most recently underwent lumpectomy which showed an additional 3 mm of invasive carcinoma.  Given HER2 amplified breast cancer, despite the size especially with the larger sample removed during the biopsy, we have discussed about considering Taxol and Herceptin. She now continues on weekly Herceptin and Taxol.  Port placement has been revised.  She appears to have some situational anxiety, continues on Xanax and has a follow-up with her psychiatrist tomorrow and will have a conversation with them regarding titration of Xanax.  If she continues to have worsening shortness of breath, we can also consider further imaging with CT chest.  X-ray has been unremarkable except for the port issues.  There is no significant presence of adventitious sounds on exam or evidence of hypoxia on the vital signs today.  She understands the plan. She will return to clinic in about 2 weeks or sooner as needed.  Okay to proceed with chemo if labs are all within parameters.

## 2022-11-26 MED FILL — Dexamethasone Sodium Phosphate Inj 100 MG/10ML: INTRAMUSCULAR | Qty: 1 | Status: AC

## 2022-11-29 ENCOUNTER — Inpatient Hospital Stay: Payer: Medicare Other

## 2022-11-29 ENCOUNTER — Other Ambulatory Visit: Payer: Self-pay

## 2022-11-29 ENCOUNTER — Inpatient Hospital Stay (HOSPITAL_BASED_OUTPATIENT_CLINIC_OR_DEPARTMENT_OTHER): Payer: Medicare Other | Admitting: Hematology and Oncology

## 2022-11-29 ENCOUNTER — Other Ambulatory Visit: Payer: Medicare Other

## 2022-11-29 ENCOUNTER — Inpatient Hospital Stay: Payer: Medicare Other | Admitting: Hematology and Oncology

## 2022-11-29 ENCOUNTER — Other Ambulatory Visit: Payer: Self-pay | Admitting: Hematology and Oncology

## 2022-11-29 DIAGNOSIS — C50411 Malignant neoplasm of upper-outer quadrant of right female breast: Secondary | ICD-10-CM

## 2022-11-29 DIAGNOSIS — Z170 Estrogen receptor positive status [ER+]: Secondary | ICD-10-CM

## 2022-11-29 DIAGNOSIS — Z5111 Encounter for antineoplastic chemotherapy: Secondary | ICD-10-CM | POA: Diagnosis not present

## 2022-11-29 DIAGNOSIS — Z95828 Presence of other vascular implants and grafts: Secondary | ICD-10-CM

## 2022-11-29 LAB — CMP (CANCER CENTER ONLY)
ALT: 20 U/L (ref 0–44)
AST: 16 U/L (ref 15–41)
Albumin: 3.8 g/dL (ref 3.5–5.0)
Alkaline Phosphatase: 80 U/L (ref 38–126)
Anion gap: 6 (ref 5–15)
BUN: 13 mg/dL (ref 8–23)
CO2: 27 mmol/L (ref 22–32)
Calcium: 8.4 mg/dL — ABNORMAL LOW (ref 8.9–10.3)
Chloride: 101 mmol/L (ref 98–111)
Creatinine: 0.74 mg/dL (ref 0.44–1.00)
GFR, Estimated: >60 mL/min (ref >60)
Glucose, Bld: 91 mg/dL (ref 70–99)
Potassium: 3.9 mmol/L (ref 3.5–5.1)
Sodium: 134 mmol/L — ABNORMAL LOW (ref 135–145)
Total Bilirubin: 0.4 mg/dL (ref 0.3–1.2)
Total Protein: 6.1 g/dL — ABNORMAL LOW (ref 6.5–8.1)

## 2022-11-29 LAB — CBC WITH DIFFERENTIAL (CANCER CENTER ONLY)
Abs Immature Granulocytes: 0.05 10*3/uL (ref 0.00–0.07)
Basophils Absolute: 0.1 10*3/uL (ref 0.0–0.1)
Basophils Relative: 1 %
Eosinophils Absolute: 0.2 10*3/uL (ref 0.0–0.5)
Eosinophils Relative: 6 %
HCT: 29.7 % — ABNORMAL LOW (ref 36.0–46.0)
Hemoglobin: 9.8 g/dL — ABNORMAL LOW (ref 12.0–15.0)
Immature Granulocytes: 1 %
Lymphocytes Relative: 22 %
Lymphs Abs: 0.8 10*3/uL (ref 0.7–4.0)
MCH: 26.8 pg (ref 26.0–34.0)
MCHC: 33 g/dL (ref 30.0–36.0)
MCV: 81.1 fL (ref 80.0–100.0)
Monocytes Absolute: 0.4 10*3/uL (ref 0.1–1.0)
Monocytes Relative: 11 %
Neutro Abs: 2.1 10*3/uL (ref 1.7–7.7)
Neutrophils Relative %: 59 %
Platelet Count: 316 10*3/uL (ref 150–400)
RBC: 3.66 MIL/uL — ABNORMAL LOW (ref 3.87–5.11)
RDW: 15.3 % (ref 11.5–15.5)
WBC Count: 3.5 10*3/uL — ABNORMAL LOW (ref 4.0–10.5)
nRBC: 0 % (ref 0.0–0.2)

## 2022-11-29 MED ORDER — ACETAMINOPHEN 325 MG PO TABS
650.0000 mg | ORAL_TABLET | Freq: Once | ORAL | Status: AC
  Administered 2022-11-29: 650 mg via ORAL
  Filled 2022-11-29: qty 2

## 2022-11-29 MED ORDER — SODIUM CHLORIDE 0.9% FLUSH
10.0000 mL | Freq: Once | INTRAVENOUS | Status: AC
  Administered 2022-11-29: 10 mL

## 2022-11-29 MED ORDER — SODIUM CHLORIDE 0.9 % IV SOLN
10.0000 mg | Freq: Once | INTRAVENOUS | Status: AC
  Administered 2022-11-29: 10 mg via INTRAVENOUS
  Filled 2022-11-29: qty 10

## 2022-11-29 MED ORDER — DIPHENHYDRAMINE HCL 50 MG/ML IJ SOLN
50.0000 mg | Freq: Once | INTRAMUSCULAR | Status: AC
  Administered 2022-11-29: 50 mg via INTRAVENOUS
  Filled 2022-11-29: qty 1

## 2022-11-29 MED ORDER — SODIUM CHLORIDE 0.9% FLUSH
10.0000 mL | INTRAVENOUS | Status: DC | PRN
  Administered 2022-11-29: 10 mL

## 2022-11-29 MED ORDER — FAMOTIDINE IN NACL 20-0.9 MG/50ML-% IV SOLN
20.0000 mg | Freq: Once | INTRAVENOUS | Status: AC
  Administered 2022-11-29: 20 mg via INTRAVENOUS
  Filled 2022-11-29: qty 50

## 2022-11-29 MED ORDER — HEPARIN SOD (PORK) LOCK FLUSH 100 UNIT/ML IV SOLN
500.0000 [IU] | Freq: Once | INTRAVENOUS | Status: AC | PRN
  Administered 2022-11-29: 500 [IU]

## 2022-11-29 MED ORDER — TRASTUZUMAB-DTTB CHEMO 150 MG IV SOLR
2.0000 mg/kg | Freq: Once | INTRAVENOUS | Status: AC
  Administered 2022-11-29: 150 mg via INTRAVENOUS
  Filled 2022-11-29: qty 7.14

## 2022-11-29 MED ORDER — SODIUM CHLORIDE 0.9 % IV SOLN
80.0000 mg/m2 | Freq: Once | INTRAVENOUS | Status: AC
  Administered 2022-11-29: 144 mg via INTRAVENOUS
  Filled 2022-11-29: qty 24

## 2022-11-29 MED ORDER — SODIUM CHLORIDE 0.9 % IV SOLN: Freq: Once | INTRAVENOUS | Status: AC

## 2022-11-29 NOTE — Progress Notes (Signed)
[facility] Follow up:    Medicine, [place] [id number]   DIAGNOSIS:  Cancer Staging  Malignant neoplasm of upper-outer quadrant of right breast in female, estrogen receptor positive ([place]) Staging form: Breast, AJCC 8th Edition - Clinical stage from 09/15/2022: Stage IA (cT1c, cN0, cM0, G2, ER+, PR+, HER2+) - Unsigned Stage prefix: Initial diagnosis Histologic grading system: 3 grade system Laterality: Right Staged by: Pathologist and managing physician Stage used in treatment planning: Yes National guidelines used in treatment planning: Yes Type of national guideline used in treatment planning: NCCN - Pathologic stage from 10/05/2022: Stage IA (pT1c, pN0, cM0, G2, ER+, PR+, HER2+) - Signed by Gardenia Phlegm, NP on 11/08/2022 Stage prefix: Initial diagnosis Histologic grading system: 3 grade system   SUMMARY OF ONCOLOGIC HISTORY: Oncology History  Malignant neoplasm of upper-outer quadrant of right breast in female, estrogen receptor positive ([place])  08/02/2022 Mammogram   In the right breast, possible distortion warrants further evaluation. In the left breast, no findings suspicious for malignancy. Diagnostic mammogram showed persistent subtle distortion central slightly LATERAL aspect of the RIGHT breast warranting tissue diagnosis.     09/01/2022 Pathology Results   Final pathology showed grade 2 invasive mammary carcinoma, prognostic showed ER 90% positive strong staining PR 90% positive strong staining, Ki-67 of 5% and HER2 3+ by [facility]   09/10/2022 Initial Diagnosis   Malignant neoplasm of upper-outer quadrant of right breast in female, estrogen receptor positive ([place])    Genetic Testing   [facility] CancerNext-Expanded Panel+RNA was Positive. 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  Report date is 10/04/2022.  The CancerNext-Expanded gene panel offered by [facility] and includes sequencing, rearrangement, and RNA analysis for the following 77 genes: AIP, ALK, APC, ATM, AXIN2, BAP1, BARD1, BLM, BMPR1A, BRCA1, BRCA2, BRIP1, CDC73, CDH1, CDK4, CDKN1B, CDKN2A, CHEK2, CTNNA1, DICER1, FANCC, FH, FLCN, GALNT12, KIF1B, LZTR1, MAX, MEN1, MET, MLH1, MSH2, MSH3, MSH6, MUTYH, NBN, NF1, NF2, NTHL1, PALB2, PHOX2B, PMS2, POT1, PRKAR1A, PTCH1, PTEN, RAD51C, RAD51D, RB1, RECQL, RET, SDHA, SDHAF2, SDHB, SDHC, SDHD, SMAD4, SMARCA4, SMARCB1, SMARCE1, STK11, SUFU, TMEM127, TP53, TSC1, TSC2, VHL and XRCC2 (sequencing and deletion/duplication); EGFR, EGLN1, HOXB13, KIT, MITF, PDGFRA, POLD1, and POLE (sequencing only); EPCAM and GREM1 (deletion/duplication only).    10/05/2022 Surgery   Right breast lumpectomy with Dr. 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  Grade 2, 2 sentinel lymph nodes negative for metastasis.  Margins negative.  T1c, N0.   10/05/2022 Cancer Staging   Staging form: Breast, AJCC 8th Edition - Pathologic stage from 10/05/2022: Stage IA (pT1c, pN0, cM0, G2, ER+, PR+, HER2+) - Signed by Gardenia Phlegm, NP on 11/08/2022 Stage prefix: Initial diagnosis Histologic grading system: 3 grade system   11/08/2022 -  Chemotherapy   Patient is on Treatment Plan : BREAST Paclitaxel + Trastuzumab q7d / Trastuzumab q21d       CURRENT THERAPY: Taxol/Herceptin  INTERVAL HISTORY:  Judy Morrison 72 y.o. female returns for follow-up prior to receiving her third weekly cycle of Taxol and Herceptin. Judy Morrison is here for follow-up.  Since her last visit here she has met with her psychiatrist and had titration of her antianxiety medication which has been working well for her.  She denies any adverse effects from chemotherapy except for hair loss.  No nausea, vomiting, neuropathy reported.  She  has plans to travel to [place] on first April hence  wants to do further chemo and she is okay with extending her chemo by a week.  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   Malignant neoplasm of upper-outer quadrant of right breast in female, estrogen receptor positive ([place]) 09/10/2022   Rash and other nonspecific skin eruption 09/15/2020   Allergic contact dermatitis 09/15/2020   Pruritus 08/14/2020   Dizziness 05/10/2018   Atypical chest pain 05/10/2018   Primary osteoarthritis of left knee 01/06/2018   S/P knee replacement 08/12/2017   Incomplete tear of right rotator cuff 05/19/2017   Osteopenia 05/19/2017   Urge incontinence of urine 05/19/2017   Primary open angle glaucoma (POAG) 12/30/2016   Primary osteoarthritis of both knees 12/30/2016   Recurrent oral herpes simplex infection 12/30/2016   Tubular adenoma of colon 12/30/2016   Hypothyroidism 06/25/2016   Right foot pain 12/19/2014   Anxiety 05/21/2011   Seasonal allergies 05/21/2011    is allergic to penicillin g, cephalexin, codeine, erythromycin, and penicillins.  MEDICAL HISTORY: Past Medical History:  Diagnosis Date   Anemia    Anxiety    Arthritis    Depressed    Dermatitis    rare form, on prednisone   GERD (gastroesophageal reflux disease)    controlled with Omeprazole   Glaucoma    Torn rotator cuff    RIGHT    Urinary tract infection    dx 05-26-17 took 1 week cipro BID completed 06-02-17.    SURGICAL HISTORY: Past Surgical History:  Procedure Laterality Date   ADENOIDECTOMY     APPENDECTOMY     arthroscopic right knee      BREAST BIOPSY Right 09/01/2022   MM RT BREAST BX W LOC DEV 1ST LESION IMAGE BX SPEC STEREO GUIDE 09/01/2022 [facility]   BREAST BIOPSY  10/01/2022   MM RT RADIOACTIVE SEED LOC MAMMO GUIDE 10/01/2022 [facility]   BREAST LUMPECTOMY  WITH RADIOACTIVE SEED AND SENTINEL LYMPH NODE BIOPSY Right 10/05/2022   Procedure: RIGHT BREAST LUMPECTOMY WITH RADIOACTIVE SEED AND AXILLARY SENTINEL LYMPH NODE BIOPSY;  Surgeon: Rolm Bookbinder, MD;  Location: [facility];  Service: General;  Laterality: Right;   CHOLECYSTECTOMY     COLONOSCOPY  06/06/2017   [place]   Pelvic sling     x2   POLYPECTOMY     PORT A CATH REVISION N/A 11/22/2022   Procedure: PORT A CATH REVISION;  Surgeon: Rolm Bookbinder, MD;  Location: [facility];  Service: General;  Laterality: N/A;   PORTACATH PLACEMENT Left 10/05/2022   Procedure: INSERTION PORT-A-CATH;  Surgeon: Rolm Bookbinder, MD;  Location: [facility];  Service: General;  Laterality: Left;   ROTATOR CUFF REPAIR Right    TONSILLECTOMY     TOTAL KNEE ARTHROPLASTY Right 08/12/2017   Procedure: RIGHT TOTAL KNEE ARTHROPLASTY;  Surgeon: Sydnee Cabal, MD;  Location: [facility];  Service: Orthopedics;  Laterality: Right;   TOTAL KNEE ARTHROPLASTY Left 01/06/2018   Procedure: LEFT TOTAL KNEE ARTHROPLASTY;  Surgeon: Sydnee Cabal, MD;  Location: [facility];  Service: Orthopedics;  Laterality: Left;   UPPER GASTROINTESTINAL ENDOSCOPY     WISDOM TOOTH EXTRACTION      SOCIAL HISTORY: Social History   Socioeconomic History   Marital status: Single    Spouse  name: Not on file   Number of children: Not on file   Years of education: Not on file   Highest education level: Not on file  Occupational History   Not on file  Tobacco Use   Smoking status: Former    Types: Cigarettes    Quit date: 70    Years since quitting: 38.2   Smokeless tobacco: Never  Vaping Use   Vaping Use: Never used  Substance and Sexual Activity   Alcohol use: Yes    Alcohol/week: 2.0 standard drinks of alcohol    Types: 2 Cans of beer per week   Drug use: No   Sexual activity: Not on file  Other Topics Concern   Not on file  Social History Narrative   Not on file   Social Determinants of Health    Financial Resource Strain: Not on file  Food Insecurity: No Food Insecurity (09/15/2022)   Hunger Vital Sign    Worried About Running Out of Food in the Last Year: Never true    Ran Out of Food in the Last Year: Never true  Transportation Needs: No Transportation Needs (09/15/2022)   PRAPARE - Hydrologist (Medical): No    Lack of Transportation (Non-Medical): No  Physical Activity: Not on file  Stress: Not on file  Social Connections: Not on file  Intimate Partner Violence: Not on file    FAMILY HISTORY: Family History  Problem Relation Age of Onset   Bowel Disease Mother        ishemic bowel   Lung cancer Mother        smoked   Cervical cancer Paternal Aunt    Cancer Maternal Grandfather        unknown type   Lung cancer Paternal Grandfather        smoked   Colon cancer Neg Hx    Breast cancer Neg Hx    Esophageal cancer Neg Hx    Pancreatic cancer Neg Hx    Prostate cancer Neg Hx    Rectal cancer Neg Hx    Stomach cancer Neg Hx    Allergic rhinitis Neg Hx    Asthma Neg Hx    Eczema Neg Hx    Urticaria Neg Hx    Colon polyps Neg Hx     Review of Systems  Constitutional:  Negative for appetite change, chills, fatigue, fever and unexpected weight change.  HENT:   Negative for hearing loss, lump/mass and trouble swallowing.   Eyes:  Negative for eye problems and icterus.  Respiratory:  Negative for chest tightness, cough and shortness of breath.   Cardiovascular:  Negative for chest pain, leg swelling and palpitations.  Gastrointestinal:  Negative for abdominal distention, abdominal pain, constipation, diarrhea, nausea and vomiting.  Endocrine: Negative for hot flashes.  Genitourinary:  Negative for difficulty urinating.   Musculoskeletal:  Negative for arthralgias.  Skin:  Negative for itching and rash.  Neurological:  Negative for dizziness, extremity weakness, headaches and numbness.  Hematological:  Negative for adenopathy. Does not  bruise/bleed easily.  Psychiatric/Behavioral:  Negative for depression. The patient is not nervous/anxious.       PHYSICAL EXAMINATION  ECOG PERFORMANCE STATUS: 1 - Symptomatic but completely ambulatory  Vitals:   11/29/22 1202  BP: 126/74  Pulse: 90  Resp: 16  Temp: 97.7 F (36.5 C)  SpO2: 97%    Physical Exam Constitutional:      General: She is not in acute distress.  Appearance: Normal appearance. She is not toxic-appearing.  HENT:     Head: Normocephalic and atraumatic.  Eyes:     General: No scleral icterus. Cardiovascular:     Rate and Rhythm: Normal rate and regular rhythm.     Pulses: Normal pulses.     Heart sounds: Normal heart sounds.  Pulmonary:     Effort: Pulmonary effort is normal.     Breath sounds: Normal breath sounds.  Abdominal:     General: Abdomen is flat. Bowel sounds are normal. There is no distension.     Palpations: Abdomen is soft.     Tenderness: There is no abdominal tenderness.  Musculoskeletal:        General: No swelling.     Cervical back: Neck supple.  Lymphadenopathy:     Cervical: No cervical adenopathy.  Skin:    General: Skin is warm and dry.     Findings: No rash.  Neurological:     General: No focal deficit present.     Mental Status: She is alert.  Psychiatric:        Mood and Affect: Mood normal.        Behavior: Behavior normal.     LABORATORY DATA:  CBC    Component Value Date/Time   WBC 3.5 (L) 11/29/2022 1135   WBC 10.1 10/01/2021 1314   RBC 3.66 (L) 11/29/2022 1135   HGB 9.8 (L) 11/29/2022 1135   HGB 13.7 09/15/2020 1209   HCT 29.7 (L) 11/29/2022 1135   HCT 41.3 09/15/2020 1209   PLT 316 11/29/2022 1135   PLT 349 09/15/2020 1209   MCV 81.1 11/29/2022 1135   MCV 86 09/15/2020 1209   MCH 26.8 11/29/2022 1135   MCHC 33.0 11/29/2022 1135   RDW 15.3 11/29/2022 1135   RDW 12.3 09/15/2020 1209   LYMPHSABS 0.8 11/29/2022 1135   LYMPHSABS 1.2 09/15/2020 1209   MONOABS 0.4 11/29/2022 1135   EOSABS  0.2 11/29/2022 1135   EOSABS 0.2 09/15/2020 1209   BASOSABS 0.1 11/29/2022 1135   BASOSABS 0.1 09/15/2020 1209    CMP     Component Value Date/Time   NA 134 (L) 11/29/2022 1135   NA 135 09/15/2020 1209   K 3.9 11/29/2022 1135   CL 101 11/29/2022 1135   CO2 27 11/29/2022 1135   GLUCOSE 91 11/29/2022 1135   BUN 13 11/29/2022 1135   BUN 14 09/15/2020 1209   CREATININE 0.74 11/29/2022 1135   CALCIUM 8.4 (L) 11/29/2022 1135   PROT 6.1 (L) 11/29/2022 1135   PROT 6.8 09/15/2020 1209   ALBUMIN 3.8 11/29/2022 1135   ALBUMIN 4.6 09/15/2020 1209   AST 16 11/29/2022 1135   ALT 20 11/29/2022 1135   ALKPHOS 80 11/29/2022 1135   BILITOT 0.4 11/29/2022 1135   GFRNONAA >60 11/29/2022 1135   GFRAA 74 09/15/2020 1209    ASSESSMENT and THERAPY PLAN:   Malignant neoplasm of upper-outer quadrant of right breast in female, estrogen receptor positive ([place]) This is a very pleasant 72 year old postmenopausal patient with newly diagnosed right breast IDC, ER and PR positive, HER2 positive by IHC, Ki 67 of 5%? referred to breast [place] for additional recommendations. On the original biopsy, the tumor measured about 10 mm.  She most recently underwent lumpectomy which showed an additional 3 mm of invasive carcinoma.  Given HER2 amplified breast cancer, despite the size especially with the larger sample removed during the biopsy, we have discussed about considering Taxol and Herceptin. She now continues on  weekly Herceptin and Taxol.  She is doing very well on this combination.  No adverse effects reported.  From anxiety standpoint she is now on Cymbalta 40 mg, clonazepam dose has been titrated as well.  No concerns on physical exam.  CBC reviewed and satisfactory, CMP pending at the time of my visit.  Okay to proceed with chemotherapy if all labs are within parameters.  She can proceed with labs and infusion next week, no need for MD visit.  The following week, she wants to defer treatment since she is going to  see her grandson's tennis match in [place]. Thank you for consulting [place] in the care of this patient.  Please do not hesitate to contact us with any additional questions or concerns.   All questions were answered. The patient knows to call the clinic with any problems, questions or concerns. We can certainly see the patient much sooner if necessary.  Total encounter time:20 minutes*in face-to-face visit time, chart review, lab review, care coordination, order entry, and documentation of the encounter time.  *Total Encounter Time as defined by the Centers for Medicare and Medicaid Services includes, in addition to the face-to-face time of a patient visit (documented in the note above) non-face-to-face time: obtaining and reviewing outside history, ordering and reviewing medications, tests or procedures, care coordination (communications with other health care professionals or caregivers) and documentation in the medical record.

## 2022-11-29 NOTE — Patient Instructions (Signed)
[facility]  Discharge Instructions: Thank you for choosing [facility] to provide your oncology and hematology care.   If you have a lab appointment with the Cancer Center, please go directly to the Cancer Center and check in at the registration area.   Wear comfortable clothing and clothing appropriate for easy access to any Portacath or PICC line.   We strive to give you quality time with your provider. You may need to reschedule your appointment if you arrive late (15 or more minutes).  Arriving late affects you and other patients whose appointments are after yours.  Also, if you miss three or more appointments without notifying the office, you may be dismissed from the clinic at the provider's discretion.      For prescription refill requests, have your pharmacy contact our office and allow 72 hours for refills to be completed.    Today you received the following chemotherapy and/or immunotherapy agents: trastuzumab       To help prevent nausea and vomiting after your treatment, we encourage you to take your nausea medication as directed.  BELOW ARE SYMPTOMS THAT SHOULD BE REPORTED IMMEDIATELY: *FEVER GREATER THAN 100.4 F (38 C) OR HIGHER *CHILLS OR SWEATING *NAUSEA AND VOMITING THAT IS NOT CONTROLLED WITH YOUR NAUSEA MEDICATION *UNUSUAL SHORTNESS OF BREATH *UNUSUAL BRUISING OR BLEEDING *URINARY PROBLEMS (pain or burning when urinating, or frequent urination) *BOWEL PROBLEMS (unusual diarrhea, constipation, pain near the anus) TENDERNESS IN MOUTH AND THROAT WITH OR WITHOUT PRESENCE OF ULCERS (sore throat, sores in mouth, or a toothache) UNUSUAL RASH, SWELLING OR PAIN  UNUSUAL VAGINAL DISCHARGE OR ITCHING   Items with * indicate a potential emergency and should be followed up as soon as possible or go to the Emergency Department if any problems should occur.  Please show the CHEMOTHERAPY ALERT CARD or IMMUNOTHERAPY ALERT CARD at  check-in to the Emergency Department and triage nurse.  Should you have questions after your visit or need to cancel or reschedule your appointment, please contact [facility]  Dept: [phone]  and follow the prompts.  Office hours are 8:00 a.m. to 4:30 p.m. Monday - Friday. Please note that voicemails left after 4:00 p.m. may not be returned until the following business day.  We are closed weekends and major holidays. You have access to a nurse at all times for urgent questions. Please call the main number to the clinic Dept: [phone] and follow the prompts.   For any non-urgent questions, you may also contact your provider using MyChart. We now offer e-Visits for anyone 18 and older to request care online for non-urgent symptoms. For details visit [email or web address].   Also download the MyChart app! Go to the app store, search "MyChart", open the app, select [facility], and log in with your MyChart username and password.   

## 2022-11-29 NOTE — Progress Notes (Signed)
Per Brandi in pharmacy, ok to infuse taxol first before trastuzamab.

## 2022-11-29 NOTE — Assessment & Plan Note (Signed)
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 referred to breast [place] for additional recommendations. On the original biopsy, the tumor measured about 10 mm.  She most recently underwent lumpectomy which showed an additional 3 mm of invasive carcinoma.  Given HER2 amplified breast cancer, despite the size especially with the larger sample removed during the biopsy, we have discussed about considering Taxol and Herceptin. She now continues on weekly Herceptin and Taxol.  She is doing very well on this combination.  No adverse effects reported.  From anxiety standpoint she is now on Cymbalta 40 mg, clonazepam dose has been titrated as well.  No concerns on physical exam.  CBC reviewed and satisfactory, CMP pending at the time of my visit.  Okay to proceed with chemotherapy if all labs are within parameters.  She can proceed with labs and infusion next week, no need for MD visit.  The following week, she wants to defer treatment since she is going to see her grandson's tennis match in [place]. Thank you for consulting [place] in the care of this patient.  Please do not hesitate to contact us with any additional questions or concerns.

## 2022-12-02 ENCOUNTER — Other Ambulatory Visit: Payer: Self-pay | Admitting: *Deleted

## 2022-12-02 DIAGNOSIS — Z170 Estrogen receptor positive status [ER+]: Secondary | ICD-10-CM

## 2022-12-03 MED FILL — Dexamethasone Sodium Phosphate Inj 100 MG/10ML: INTRAMUSCULAR | Qty: 1 | Status: AC

## 2022-12-06 ENCOUNTER — Inpatient Hospital Stay: Payer: Medicare Other

## 2022-12-06 ENCOUNTER — Inpatient Hospital Stay: Payer: Medicare Other | Admitting: Hematology and Oncology

## 2022-12-06 ENCOUNTER — Other Ambulatory Visit: Payer: Medicare Other

## 2022-12-06 ENCOUNTER — Other Ambulatory Visit: Payer: Self-pay

## 2022-12-06 VITALS — BP 147/86 | HR 92 | Temp 97.6°F | Resp 14 | Wt 164.2 lb

## 2022-12-06 DIAGNOSIS — Z170 Estrogen receptor positive status [ER+]: Secondary | ICD-10-CM

## 2022-12-06 DIAGNOSIS — Z5111 Encounter for antineoplastic chemotherapy: Secondary | ICD-10-CM | POA: Diagnosis not present

## 2022-12-06 DIAGNOSIS — Z95828 Presence of other vascular implants and grafts: Secondary | ICD-10-CM

## 2022-12-06 LAB — CMP (CANCER CENTER ONLY)
ALT: 19 U/L (ref 0–44)
AST: 14 U/L — ABNORMAL LOW (ref 15–41)
Albumin: 3.9 g/dL (ref 3.5–5.0)
Alkaline Phosphatase: 82 U/L (ref 38–126)
Anion gap: 5 (ref 5–15)
BUN: 10 mg/dL (ref 8–23)
CO2: 28 mmol/L (ref 22–32)
Calcium: 9 mg/dL (ref 8.9–10.3)
Chloride: 103 mmol/L (ref 98–111)
Creatinine: 0.79 mg/dL (ref 0.44–1.00)
GFR, Estimated: >60 mL/min (ref >60)
Glucose, Bld: 96 mg/dL (ref 70–99)
Potassium: 3.9 mmol/L (ref 3.5–5.1)
Sodium: 136 mmol/L (ref 135–145)
Total Bilirubin: 0.3 mg/dL (ref 0.3–1.2)
Total Protein: 6.1 g/dL — ABNORMAL LOW (ref 6.5–8.1)

## 2022-12-06 LAB — CBC WITH DIFFERENTIAL (CANCER CENTER ONLY)
Abs Immature Granulocytes: 0.06 10*3/uL (ref 0.00–0.07)
Basophils Absolute: 0.1 10*3/uL (ref 0.0–0.1)
Basophils Relative: 1 %
Eosinophils Absolute: 0.2 10*3/uL (ref 0.0–0.5)
Eosinophils Relative: 6 %
HCT: 31.2 % — ABNORMAL LOW (ref 36.0–46.0)
Hemoglobin: 10.4 g/dL — ABNORMAL LOW (ref 12.0–15.0)
Immature Granulocytes: 2 %
Lymphocytes Relative: 20 %
Lymphs Abs: 0.8 10*3/uL (ref 0.7–4.0)
MCH: 27.2 pg (ref 26.0–34.0)
MCHC: 33.3 g/dL (ref 30.0–36.0)
MCV: 81.7 fL (ref 80.0–100.0)
Monocytes Absolute: 0.5 10*3/uL (ref 0.1–1.0)
Monocytes Relative: 12 %
Neutro Abs: 2.4 10*3/uL (ref 1.7–7.7)
Neutrophils Relative %: 59 %
Platelet Count: 339 10*3/uL (ref 150–400)
RBC: 3.82 MIL/uL — ABNORMAL LOW (ref 3.87–5.11)
RDW: 16.2 % — ABNORMAL HIGH (ref 11.5–15.5)
WBC Count: 4.1 10*3/uL (ref 4.0–10.5)
nRBC: 0 % (ref 0.0–0.2)

## 2022-12-06 MED ORDER — SODIUM CHLORIDE 0.9 % IV SOLN
80.0000 mg/m2 | Freq: Once | INTRAVENOUS | Status: AC
  Administered 2022-12-06: 144 mg via INTRAVENOUS
  Filled 2022-12-06: qty 24

## 2022-12-06 MED ORDER — TRASTUZUMAB-DTTB CHEMO 150 MG IV SOLR
2.0000 mg/kg | Freq: Once | INTRAVENOUS | Status: AC
  Administered 2022-12-06: 150 mg via INTRAVENOUS
  Filled 2022-12-06: qty 7.14

## 2022-12-06 MED ORDER — SODIUM CHLORIDE 0.9% FLUSH
10.0000 mL | Freq: Once | INTRAVENOUS | Status: AC
  Administered 2022-12-06: 10 mL

## 2022-12-06 MED ORDER — SODIUM CHLORIDE 0.9% FLUSH
10.0000 mL | INTRAVENOUS | Status: DC | PRN
  Administered 2022-12-06: 10 mL

## 2022-12-06 MED ORDER — SODIUM CHLORIDE 0.9 % IV SOLN
10.0000 mg | Freq: Once | INTRAVENOUS | Status: AC
  Administered 2022-12-06: 10 mg via INTRAVENOUS
  Filled 2022-12-06: qty 10

## 2022-12-06 MED ORDER — SODIUM CHLORIDE 0.9 % IV SOLN: Freq: Once | INTRAVENOUS | Status: AC

## 2022-12-06 MED ORDER — ACETAMINOPHEN 325 MG PO TABS
650.0000 mg | ORAL_TABLET | Freq: Once | ORAL | Status: AC
  Administered 2022-12-06: 650 mg via ORAL
  Filled 2022-12-06: qty 2

## 2022-12-06 MED ORDER — DIPHENHYDRAMINE HCL 50 MG/ML IJ SOLN
50.0000 mg | Freq: Once | INTRAMUSCULAR | Status: AC
  Administered 2022-12-06: 50 mg via INTRAVENOUS
  Filled 2022-12-06: qty 1

## 2022-12-06 MED ORDER — HEPARIN SOD (PORK) LOCK FLUSH 100 UNIT/ML IV SOLN
500.0000 [IU] | Freq: Once | INTRAVENOUS | Status: AC | PRN
  Administered 2022-12-06: 500 [IU]

## 2022-12-06 MED ORDER — FAMOTIDINE IN NACL 20-0.9 MG/50ML-% IV SOLN
20.0000 mg | Freq: Once | INTRAVENOUS | Status: AC
  Administered 2022-12-06: 20 mg via INTRAVENOUS
  Filled 2022-12-06: qty 50

## 2022-12-06 NOTE — Patient Instructions (Signed)
[facility]  Discharge Instructions: Thank you for choosing [facility] to provide your oncology and hematology care.   If you have a lab appointment with the [facility], please go directly to the [facility] and check in at the registration area.   Wear comfortable clothing and clothing appropriate for easy access to any Portacath or PICC line.   We strive to give you quality time with your provider. You may need to reschedule your appointment if you arrive late (15 or more minutes).  Arriving late affects you and other patients whose appointments are after yours.  Also, if you miss three or more appointments without notifying the office, you may be dismissed from the clinic at the provider's discretion.      For prescription refill requests, have your pharmacy contact our office and allow 72 hours for refills to be completed.    Today you received the following chemotherapy and/or immunotherapy agents: Ontruzant & Taxol      To help prevent nausea and vomiting after your treatment, we encourage you to take your nausea medication as directed.  BELOW ARE SYMPTOMS THAT SHOULD BE REPORTED IMMEDIATELY: *FEVER GREATER THAN 100.4 F (38 C) OR HIGHER *CHILLS OR SWEATING *NAUSEA AND VOMITING THAT IS NOT CONTROLLED WITH YOUR NAUSEA MEDICATION *UNUSUAL SHORTNESS OF BREATH *UNUSUAL BRUISING OR BLEEDING *URINARY PROBLEMS (pain or burning when urinating, or frequent urination) *BOWEL PROBLEMS (unusual diarrhea, constipation, pain near the anus) TENDERNESS IN MOUTH AND THROAT WITH OR WITHOUT PRESENCE OF ULCERS (sore throat, sores in mouth, or a toothache) UNUSUAL RASH, SWELLING OR PAIN  UNUSUAL VAGINAL DISCHARGE OR ITCHING   Items with * indicate a potential emergency and should be followed up as soon as possible or go to the Emergency Department if any problems should occur.  Please show the CHEMOTHERAPY ALERT CARD or IMMUNOTHERAPY ALERT CARD  at check-in to the Emergency Department and triage nurse.  Should you have questions after your visit or need to cancel or reschedule your appointment, please contact [facility]  Dept: [phone]  and follow the prompts.  Office hours are 8:00 a.m. to 4:30 p.m. Monday - Friday. Please note that voicemails left after 4:00 p.m. may not be returned until the following business day.  We are closed weekends and major holidays. You have access to a nurse at all times for urgent questions. Please call the main number to the clinic Dept: [phone] and follow the prompts.   For any non-urgent questions, you may also contact your provider using MyChart. We now offer e-Visits for anyone 102 and older to request care online for non-urgent symptoms. For details visit [email or web address].   Also download the MyChart app! Go to the app store, search "MyChart", open the app, select [facility], and log in with your MyChart username and password.

## 2022-12-13 ENCOUNTER — Ambulatory Visit: Payer: Medicare Other

## 2022-12-13 ENCOUNTER — Other Ambulatory Visit: Payer: Medicare Other

## 2022-12-13 ENCOUNTER — Ambulatory Visit: Payer: Medicare Other | Admitting: Hematology and Oncology

## 2022-12-14 ENCOUNTER — Other Ambulatory Visit: Payer: Self-pay

## 2022-12-15 ENCOUNTER — Other Ambulatory Visit: Payer: Self-pay

## 2022-12-15 DIAGNOSIS — C50411 Malignant neoplasm of upper-outer quadrant of right female breast: Secondary | ICD-10-CM

## 2022-12-16 ENCOUNTER — Other Ambulatory Visit: Payer: Self-pay

## 2022-12-17 MED FILL — Dexamethasone Sodium Phosphate Inj 100 MG/10ML: INTRAMUSCULAR | Qty: 1 | Status: AC

## 2022-12-20 ENCOUNTER — Inpatient Hospital Stay: Payer: Medicare Other

## 2022-12-20 ENCOUNTER — Inpatient Hospital Stay (HOSPITAL_BASED_OUTPATIENT_CLINIC_OR_DEPARTMENT_OTHER): Payer: Medicare Other | Admitting: Hematology and Oncology

## 2022-12-20 ENCOUNTER — Other Ambulatory Visit: Payer: Self-pay

## 2022-12-20 ENCOUNTER — Inpatient Hospital Stay: Payer: Medicare Other | Attending: Hematology and Oncology

## 2022-12-20 VITALS — BP 139/77 | HR 88 | Resp 18

## 2022-12-20 VITALS — BP 147/62 | HR 96 | Temp 97.7°F | Resp 16 | Ht 63.0 in | Wt 167.5 lb

## 2022-12-20 DIAGNOSIS — Z5112 Encounter for antineoplastic immunotherapy: Secondary | ICD-10-CM | POA: Diagnosis not present

## 2022-12-20 DIAGNOSIS — Z79899 Other long term (current) drug therapy: Secondary | ICD-10-CM | POA: Diagnosis not present

## 2022-12-20 DIAGNOSIS — Z170 Estrogen receptor positive status [ER+]: Secondary | ICD-10-CM | POA: Insufficient documentation

## 2022-12-20 DIAGNOSIS — Z95828 Presence of other vascular implants and grafts: Secondary | ICD-10-CM

## 2022-12-20 DIAGNOSIS — Z5111 Encounter for antineoplastic chemotherapy: Secondary | ICD-10-CM | POA: Insufficient documentation

## 2022-12-20 DIAGNOSIS — C50411 Malignant neoplasm of upper-outer quadrant of right female breast: Secondary | ICD-10-CM | POA: Insufficient documentation

## 2022-12-20 DIAGNOSIS — Z5181 Encounter for therapeutic drug level monitoring: Secondary | ICD-10-CM | POA: Diagnosis not present

## 2022-12-20 LAB — CMP (CANCER CENTER ONLY)
ALT: 17 U/L (ref 0–44)
AST: 15 U/L (ref 15–41)
Albumin: 3.9 g/dL (ref 3.5–5.0)
Alkaline Phosphatase: 89 U/L (ref 38–126)
Anion gap: 5 (ref 5–15)
BUN: 11 mg/dL (ref 8–23)
CO2: 29 mmol/L (ref 22–32)
Calcium: 9 mg/dL (ref 8.9–10.3)
Chloride: 102 mmol/L (ref 98–111)
Creatinine: 0.87 mg/dL (ref 0.44–1.00)
GFR, Estimated: >60 mL/min (ref >60)
Glucose, Bld: 88 mg/dL (ref 70–99)
Potassium: 4.1 mmol/L (ref 3.5–5.1)
Sodium: 136 mmol/L (ref 135–145)
Total Bilirubin: 0.3 mg/dL (ref 0.3–1.2)
Total Protein: 6.1 g/dL — ABNORMAL LOW (ref 6.5–8.1)

## 2022-12-20 LAB — CBC WITH DIFFERENTIAL (CANCER CENTER ONLY)
Abs Immature Granulocytes: 0.05 10*3/uL (ref 0.00–0.07)
Basophils Absolute: 0.1 10*3/uL (ref 0.0–0.1)
Basophils Relative: 1 %
Eosinophils Absolute: 0.2 10*3/uL (ref 0.0–0.5)
Eosinophils Relative: 3 %
HCT: 30.8 % — ABNORMAL LOW (ref 36.0–46.0)
Hemoglobin: 10 g/dL — ABNORMAL LOW (ref 12.0–15.0)
Immature Granulocytes: 1 %
Lymphocytes Relative: 16 %
Lymphs Abs: 1.2 10*3/uL (ref 0.7–4.0)
MCH: 26.3 pg (ref 26.0–34.0)
MCHC: 32.5 g/dL (ref 30.0–36.0)
MCV: 81.1 fL (ref 80.0–100.0)
Monocytes Absolute: 1.2 10*3/uL — ABNORMAL HIGH (ref 0.1–1.0)
Monocytes Relative: 16 %
Neutro Abs: 4.6 10*3/uL (ref 1.7–7.7)
Neutrophils Relative %: 63 %
Platelet Count: 367 10*3/uL (ref 150–400)
RBC: 3.8 MIL/uL — ABNORMAL LOW (ref 3.87–5.11)
RDW: 16.3 % — ABNORMAL HIGH (ref 11.5–15.5)
WBC Count: 7.2 10*3/uL (ref 4.0–10.5)
nRBC: 0 % (ref 0.0–0.2)

## 2022-12-20 LAB — RESEARCH LABS

## 2022-12-20 MED ORDER — HEPARIN SOD (PORK) LOCK FLUSH 100 UNIT/ML IV SOLN
500.0000 [IU] | Freq: Once | INTRAVENOUS | Status: AC | PRN
  Administered 2022-12-20: 500 [IU]

## 2022-12-20 MED ORDER — ACETAMINOPHEN 325 MG PO TABS
650.0000 mg | ORAL_TABLET | Freq: Once | ORAL | Status: AC
  Administered 2022-12-20: 650 mg via ORAL
  Filled 2022-12-20: qty 2

## 2022-12-20 MED ORDER — SODIUM CHLORIDE 0.9 % IV SOLN
10.0000 mg | Freq: Once | INTRAVENOUS | Status: AC
  Administered 2022-12-20: 10 mg via INTRAVENOUS
  Filled 2022-12-20: qty 10

## 2022-12-20 MED ORDER — SODIUM CHLORIDE 0.9% FLUSH
10.0000 mL | INTRAVENOUS | Status: DC | PRN
  Administered 2022-12-20: 10 mL

## 2022-12-20 MED ORDER — SODIUM CHLORIDE 0.9 % IV SOLN
80.0000 mg/m2 | Freq: Once | INTRAVENOUS | Status: AC
  Administered 2022-12-20: 144 mg via INTRAVENOUS
  Filled 2022-12-20: qty 24

## 2022-12-20 MED ORDER — SODIUM CHLORIDE 0.9 % IV SOLN: Freq: Once | INTRAVENOUS | Status: AC

## 2022-12-20 MED ORDER — FAMOTIDINE IN NACL 20-0.9 MG/50ML-% IV SOLN
20.0000 mg | Freq: Once | INTRAVENOUS | Status: AC
  Administered 2022-12-20: 20 mg via INTRAVENOUS
  Filled 2022-12-20: qty 50

## 2022-12-20 MED ORDER — SODIUM CHLORIDE 0.9% FLUSH
10.0000 mL | Freq: Once | INTRAVENOUS | Status: AC
  Administered 2022-12-20: 10 mL

## 2022-12-20 MED ORDER — TRASTUZUMAB-DTTB CHEMO 150 MG IV SOLR
2.0000 mg/kg | Freq: Once | INTRAVENOUS | Status: AC
  Administered 2022-12-20: 150 mg via INTRAVENOUS
  Filled 2022-12-20: qty 7.14

## 2022-12-20 MED ORDER — DIPHENHYDRAMINE HCL 50 MG/ML IJ SOLN
50.0000 mg | Freq: Once | INTRAMUSCULAR | Status: AC
  Administered 2022-12-20: 50 mg via INTRAVENOUS
  Filled 2022-12-20: qty 1

## 2022-12-20 NOTE — Progress Notes (Signed)
Per MD, ok to start pre-medications while CMP results are pending.

## 2022-12-20 NOTE — Progress Notes (Signed)
[facility] Cancer Follow up:    Medicine, [facility] [address] [place] [address]   DIAGNOSIS:  Cancer Staging  Malignant neoplasm of upper-outer quadrant of right breast in female, estrogen receptor positive Staging form: Breast, AJCC 8th Edition - Clinical stage from 09/15/2022: Stage IA (cT1c, cN0, cM0, G2, ER+, PR+, HER2+) - Unsigned Stage prefix: Initial diagnosis Histologic grading system: 3 grade system Laterality: Right Staged by: Pathologist and managing physician Stage used in treatment planning: Yes National guidelines used in treatment planning: Yes Type of national guideline used in treatment planning: NCCN - Pathologic stage from 10/05/2022: Stage IA (pT1c, pN0, cM0, G2, ER+, PR+, HER2+) - Signed by Loa Socks, NP on 11/08/2022 Stage prefix: Initial diagnosis Histologic grading system: 3 grade system   SUMMARY OF ONCOLOGIC HISTORY: Oncology History  Malignant neoplasm of upper-outer quadrant of right breast in female, estrogen receptor positive  08/02/2022 Mammogram   In the right breast, possible distortion warrants further evaluation. In the left breast, no findings suspicious for malignancy. Diagnostic mammogram showed persistent subtle distortion central slightly LATERAL aspect of the RIGHT breast warranting tissue diagnosis.     09/01/2022 Pathology Results   Final pathology showed grade 2 invasive mammary carcinoma, prognostic showed ER 90% positive strong staining PR 90% positive strong staining, Ki-67 of 5% and HER2 3+ by [facility]   09/10/2022 Initial Diagnosis   Malignant neoplasm of upper-outer quadrant of right breast in female, estrogen receptor positive (HCC)    Genetic Testing   [facility] CancerNext-Expanded Panel+RNA was Positive. 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 Report date  is 10/04/2022.  The CancerNext-Expanded gene panel offered by [facility] and includes sequencing, rearrangement, and RNA analysis for the following 77 genes: AIP, ALK, APC, ATM, AXIN2, BAP1, BARD1, BLM, BMPR1A, BRCA1, BRCA2, BRIP1, CDC73, CDH1, CDK4, CDKN1B, CDKN2A, CHEK2, CTNNA1, DICER1, FANCC, FH, FLCN, GALNT12, KIF1B, LZTR1, MAX, MEN1, MET, MLH1, MSH2, MSH3, MSH6, MUTYH, NBN, NF1, NF2, NTHL1, PALB2, PHOX2B, PMS2, POT1, PRKAR1A, PTCH1, PTEN, RAD51C, RAD51D, RB1, RECQL, RET, SDHA, SDHAF2, SDHB, SDHC, SDHD, SMAD4, SMARCA4, SMARCB1, SMARCE1, STK11, SUFU, TMEM127, TP53, TSC1, TSC2, VHL and XRCC2 (sequencing and deletion/duplication); EGFR, EGLN1, HOXB13, KIT, MITF, PDGFRA, POLD1, and POLE (sequencing only); EPCAM and GREM1 (deletion/duplication only).    10/05/2022 Surgery   Right breast lumpectomy with Dr. 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  Grade 2, 2 sentinel lymph nodes negative for metastasis.  Margins negative.  T1c, N0.   10/05/2022 Cancer Staging   Staging form: Breast, AJCC 8th Edition - Pathologic stage from 10/05/2022: Stage IA (pT1c, pN0, cM0, G2, ER+, PR+, HER2+) - Signed by Loa Socks, NP on 11/08/2022 Stage prefix: Initial diagnosis Histologic grading system: 3 grade system   11/08/2022 -  Chemotherapy   Patient is on Treatment Plan : BREAST Paclitaxel + Trastuzumab q7d / Trastuzumab q21d       CURRENT THERAPY: Taxol/Herceptin  INTERVAL HISTORY:  Eulis Foster Reiber 72 y.o. female returns for follow-up prior to receiving her third weekly cycle of Taxol and Herceptin. Ms. Marton is here for follow-up.  Since her last visit, daughter says she is having some issues with mental fogginess, like for ex this morning she couldn't remember where to go for her labs. Other than this, she is doing ok. She had fun watching tennis match last week in [facility] No nausea,  vomiting, diarrhea. No neuropathy No change in breathing, bowel habits or urinary  habits No new neurological complaints Rest of the pertinent 10 point ROS reviewed and neg  Patient Active Problem List   Diagnosis Date Noted   Port-A-Cath in place 11/15/2022   Genetic testing 10/04/2022   Monoallelic mutation of ATM gene 08/65/7846   Malignant neoplasm of upper-outer quadrant of right breast in female, estrogen receptor positive 09/10/2022   Rash and other nonspecific skin eruption 09/15/2020   Allergic contact dermatitis 09/15/2020   Pruritus 08/14/2020   Dizziness 05/10/2018   Atypical chest pain 05/10/2018   Primary osteoarthritis of left knee 01/06/2018   S/P knee replacement 08/12/2017   Incomplete tear of right rotator cuff 05/19/2017   Osteopenia 05/19/2017   Urge incontinence of urine 05/19/2017   Primary open angle glaucoma (POAG) 12/30/2016   Primary osteoarthritis of both knees 12/30/2016   Recurrent oral herpes simplex infection 12/30/2016   Tubular adenoma of colon 12/30/2016   Hypothyroidism 06/25/2016   Right foot pain 12/19/2014   Anxiety 05/21/2011   Seasonal allergies 05/21/2011    is allergic to penicillin g, cephalexin, codeine, erythromycin, and penicillins.  MEDICAL HISTORY: Past Medical History:  Diagnosis Date   Anemia    Anxiety    Arthritis    Depressed    Dermatitis    rare form, on prednisone   GERD (gastroesophageal reflux disease)    controlled with Omeprazole   Glaucoma    Torn rotator cuff    RIGHT    Urinary tract infection    dx 05-26-17 took 1 week cipro BID completed 06-02-17.    SURGICAL HISTORY: Past Surgical History:  Procedure Laterality Date   ADENOIDECTOMY     APPENDECTOMY     arthroscopic right knee      BREAST BIOPSY Right 09/01/2022   MM RT BREAST BX W LOC DEV 1ST LESION IMAGE BX SPEC STEREO GUIDE 09/01/2022 [facility]   BREAST BIOPSY  10/01/2022   MM RT RADIOACTIVE SEED LOC MAMMO GUIDE 10/01/2022 [facility]   BREAST LUMPECTOMY WITH RADIOACTIVE SEED AND SENTINEL LYMPH NODE BIOPSY  Right 10/05/2022   Procedure: RIGHT BREAST LUMPECTOMY WITH RADIOACTIVE SEED AND AXILLARY SENTINEL LYMPH NODE BIOPSY;  Surgeon: [name], MD;  Location: [facility];  Service: General;  Laterality: Right;   CHOLECYSTECTOMY     COLONOSCOPY  06/06/2017   Pyrtle   Pelvic sling     x2   POLYPECTOMY     PORT A CATH REVISION N/A 11/22/2022   Procedure: PORT A CATH REVISION;  Surgeon: [name], MD;  Location[facility];  Service: General;  Laterality: N/A;   PORTACATH PLACEMENT Left 10/05/2022   Procedure: INSERTION PORT-A-CATH;  Surgeon: [name], MD;  Location: [facility];  Service: General;  Laterality: Left;   ROTATOR CUFF REPAIR Right    TONSILLECTOMY     TOTAL KNEE ARTHROPLASTY Right 08/12/2017   Procedure: RIGHT TOTAL KNEE ARTHROPLASTY;  Surgeon: Eugenia Mcalpine, MD;  Location: [facility];  Service: Orthopedics;  Laterality: Right;   TOTAL KNEE ARTHROPLASTY Left 01/06/2018   Procedure: LEFT TOTAL KNEE ARTHROPLASTY;  Surgeon: Eugenia Mcalpine, MD;  Location: [facility];  Service: Orthopedics;  Laterality: Left;   UPPER GASTROINTESTINAL ENDOSCOPY     WISDOM TOOTH EXTRACTION      SOCIAL HISTORY: Social History   Socioeconomic History   Marital status: Single    Spouse name: Not on file   Number of children: Not on file  Years of education: Not on file   Highest education level: Not on file  Occupational History   Not on file  Tobacco Use   Smoking status: Former    Types: Cigarettes    Quit date: 11986    Years since quitting: 38.2   Smokeless tobacco: Never  Vaping Use   Vaping Use: Never used  Substance and Sexual Activity   Alcohol use: Yes    Alcohol/week: 2.0 standard drinks of alcohol    Types: 2 Cans of beer per week   Drug use: No   Sexual activity: Not on file  Other Topics Concern   Not on file  Social History Narrative   Not on file   Social Determinants of Health   Financial Resource Strain: Not on file  Food  Insecurity: No Food Insecurity (09/15/2022)   Hunger Vital Sign    Worried About Running Out of Food in the Last Year: Never true    Ran Out of Food in the Last Year: Never true  Transportation Needs: No Transportation Needs (09/15/2022)   PRAPARE - Administrator, Civil ServiceTransportation    Lack of Transportation (Medical): No    Lack of Transportation (Non-Medical): No  Physical Activity: Not on file  Stress: Not on file  Social Connections: Not on file  Intimate Partner Violence: Not on file    FAMILY HISTORY: Family History  Problem Relation Age of Onset   Bowel Disease Mother        ishemic bowel   Lung cancer Mother        smoked   Cervical cancer Paternal Aunt    Cancer Maternal Grandfather        unknown type   Lung cancer Paternal Grandfather        smoked   Colon cancer Neg Hx    Breast cancer Neg Hx    Esophageal cancer Neg Hx    Pancreatic cancer Neg Hx    Prostate cancer Neg Hx    Rectal cancer Neg Hx    Stomach cancer Neg Hx    Allergic rhinitis Neg Hx    Asthma Neg Hx    Eczema Neg Hx    Urticaria Neg Hx    Colon polyps Neg Hx     Review of Systems  Constitutional:  Negative for appetite change, chills, fatigue, fever and unexpected weight change.  HENT:   Negative for hearing loss, lump/mass and trouble swallowing.   Eyes:  Negative for eye problems and icterus.  Respiratory:  Negative for chest tightness, cough and shortness of breath.   Cardiovascular:  Negative for chest pain, leg swelling and palpitations.  Gastrointestinal:  Negative for abdominal distention, abdominal pain, constipation, diarrhea, nausea and vomiting.  Endocrine: Negative for hot flashes.  Genitourinary:  Negative for difficulty urinating.   Musculoskeletal:  Negative for arthralgias.  Skin:  Negative for itching and rash.  Neurological:  Negative for dizziness, extremity weakness, headaches and numbness.  Hematological:  Negative for adenopathy. Does not bruise/bleed easily.  Psychiatric/Behavioral:   Negative for depression. The patient is not nervous/anxious.       PHYSICAL EXAMINATION  ECOG PERFORMANCE STATUS: 1 - Symptomatic but completely ambulatory  Vitals:   12/20/22 1012  BP: (!) 147/62  Pulse: 96  Resp: 16  Temp: 97.7 F (36.5 C)  SpO2: 100%    Physical Exam Constitutional:      General: She is not in acute distress.    Appearance: Normal appearance. She is not toxic-appearing.  HENT:  Head: Normocephalic and atraumatic.  Eyes:     General: No scleral icterus. Cardiovascular:     Rate and Rhythm: Normal rate and regular rhythm.     Pulses: Normal pulses.     Heart sounds: Normal heart sounds.  Pulmonary:     Effort: Pulmonary effort is normal.     Breath sounds: Normal breath sounds.  Abdominal:     General: Abdomen is flat. Bowel sounds are normal. There is no distension.     Palpations: Abdomen is soft.     Tenderness: There is no abdominal tenderness.  Musculoskeletal:        General: No swelling.     Cervical back: Neck supple.  Lymphadenopathy:     Cervical: No cervical adenopathy.  Skin:    General: Skin is warm and dry.     Findings: No rash.  Neurological:     General: No focal deficit present.     Mental Status: She is alert.  Psychiatric:        Mood and Affect: Mood normal.        Behavior: Behavior normal.     LABORATORY DATA:  CBC    Component Value Date/Time   WBC 7.2 12/20/2022 0945   WBC 10.1 10/01/2021 1314   RBC 3.80 (L) 12/20/2022 0945   HGB 10.0 (L) 12/20/2022 0945   HGB 13.7 09/15/2020 1209   HCT 30.8 (L) 12/20/2022 0945   HCT 41.3 09/15/2020 1209   PLT 367 12/20/2022 0945   PLT 349 09/15/2020 1209   MCV 81.1 12/20/2022 0945   MCV 86 09/15/2020 1209   MCH 26.3 12/20/2022 0945   MCHC 32.5 12/20/2022 0945   RDW 16.3 (H) 12/20/2022 0945   RDW 12.3 09/15/2020 1209   LYMPHSABS 1.2 12/20/2022 0945   LYMPHSABS 1.2 09/15/2020 1209   MONOABS 1.2 (H) 12/20/2022 0945   EOSABS 0.2 12/20/2022 0945   EOSABS 0.2  09/15/2020 1209   BASOSABS 0.1 12/20/2022 0945   BASOSABS 0.1 09/15/2020 1209    CMP     Component Value Date/Time   NA 136 12/06/2022 0916   NA 135 09/15/2020 1209   K 3.9 12/06/2022 0916   CL 103 12/06/2022 0916   CO2 28 12/06/2022 0916   GLUCOSE 96 12/06/2022 0916   BUN 10 12/06/2022 0916   BUN 14 09/15/2020 1209   CREATININE 0.79 12/06/2022 0916   CALCIUM 9.0 12/06/2022 0916   PROT 6.1 (L) 12/06/2022 0916   PROT 6.8 09/15/2020 1209   ALBUMIN 3.9 12/06/2022 0916   ALBUMIN 4.6 09/15/2020 1209   AST 14 (L) 12/06/2022 0916   ALT 19 12/06/2022 0916   ALKPHOS 82 12/06/2022 0916   BILITOT 0.3 12/06/2022 0916   GFRNONAA >60 12/06/2022 0916   GFRAA 74 09/15/2020 1209    ASSESSMENT and THERAPY PLAN:   Malignant neoplasm of upper-outer quadrant of right breast in female, estrogen receptor positive (HCC) This is a very pleasant 72 year old postmenopausal patient with newly diagnosed right breast IDC, ER and PR positive, HER2 positive by IHC, Ki 67 of 5%? referred to breast [facility] for additional recommendations. On the original biopsy, the tumor measured about 10 mm.  She most recently underwent lumpectomy which showed an additional 3 mm of invasive carcinoma.  Given HER2 amplified breast cancer, despite the size especially with the larger sample removed during the biopsy, we have discussed about considering Taxol and Herceptin. She now continues on weekly Herceptin and Taxol.  She is doing very well on this combination.  No adverse effects reported except for some fogginess, remembering and following simple instructions. No dose limiting toxicity reported otherwise. Physical exam unremarkable otherwise, ok to continue herceptin and taxol. We will continue to monitor the fogginess.  RTC every week for chemo, every other week for appointments, labs same day as chemo. ECHO ordered for May.  Thank you for consulting [place] in the care of this patient.  Please do not hesitate to contact us with  any additional questions or concerns.   All questions were answered. The patient knows to call the clinic with any problems, questions or concerns. We can certainly see the patient much sooner if necessary.  Total encounter time: 30 minutes*in face-to-face visit time, chart review, lab review, care coordination, order entry, and documentation of the encounter time.  *Total Encounter Time as defined by the Centers for Medicare and Medicaid Services includes, in addition to the face-to-face time of a patient visit (documented in the note above) non-face-to-face time: obtaining and reviewing outside history, ordering and reviewing medications, tests or procedures, care coordination (communications with other health care professionals or caregivers) and documentation in the medical record.

## 2022-12-20 NOTE — Assessment & Plan Note (Signed)
This is a very pleasant 73 year old postmenopausal patient with newly diagnosed right breast IDC, ER and PR positive, HER2 positive by IHC, Ki 67 of 5%? referred to breast [facility] for additional recommendations. On the original biopsy, the tumor measured about 10 mm.  She most recently underwent lumpectomy which showed an additional 3 mm of invasive carcinoma.  Given HER2 amplified breast cancer, despite the size especially with the larger sample removed during the biopsy, we have discussed about considering Taxol and Herceptin. She now continues on weekly Herceptin and Taxol.  She is doing very well on this combination.  No adverse effects reported except for some fogginess, remembering and following simple instructions. No dose limiting toxicity reported otherwise. Physical exam unremarkable otherwise, ok to continue herceptin and taxol. We will continue to monitor the fogginess.  RTC every week for chemo, every other week for appointments, labs same day as chemo. ECHO ordered for May.  Thank you for consulting [place] in the care of this patient.  Please do not hesitate to contact us with any additional questions or concerns.

## 2022-12-20 NOTE — Patient Instructions (Signed)
[facility]  Discharge Instructions: Thank you for choosing [facility] to provide your oncology and hematology care.   If you have a lab appointment with the Cancer Center, please go directly to the Cancer Center and check in at the registration area.   Wear comfortable clothing and clothing appropriate for easy access to any Portacath or PICC line.   We strive to give you quality time with your provider. You may need to reschedule your appointment if you arrive late (15 or more minutes).  Arriving late affects you and other patients whose appointments are after yours.  Also, if you miss three or more appointments without notifying the office, you may be dismissed from the clinic at the provider's discretion.      For prescription refill requests, have your pharmacy contact our office and allow 72 hours for refills to be completed.    Today you received the following chemotherapy and/or immunotherapy agents: Ontruzant, Paclitaxel.       To help prevent nausea and vomiting after your treatment, we encourage you to take your nausea medication as directed.  BELOW ARE SYMPTOMS THAT SHOULD BE REPORTED IMMEDIATELY: *FEVER GREATER THAN 100.4 F (38 C) OR HIGHER *CHILLS OR SWEATING *NAUSEA AND VOMITING THAT IS NOT CONTROLLED WITH YOUR NAUSEA MEDICATION *UNUSUAL SHORTNESS OF BREATH *UNUSUAL BRUISING OR BLEEDING *URINARY PROBLEMS (pain or burning when urinating, or frequent urination) *BOWEL PROBLEMS (unusual diarrhea, constipation, pain near the anus) TENDERNESS IN MOUTH AND THROAT WITH OR WITHOUT PRESENCE OF ULCERS (sore throat, sores in mouth, or a toothache) UNUSUAL RASH, SWELLING OR PAIN  UNUSUAL VAGINAL DISCHARGE OR ITCHING   Items with * indicate a potential emergency and should be followed up as soon as possible or go to the Emergency Department if any problems should occur.  Please show the CHEMOTHERAPY ALERT CARD or IMMUNOTHERAPY ALERT  CARD at check-in to the Emergency Department and triage nurse.  Should you have questions after your visit or need to cancel or reschedule your appointment, please contact [place] [facility]  Dept: [phone]  and follow the prompts.  Office hours are 8:00 a.m. to 4:30 p.m. Monday - Friday. Please note that voicemails left after 4:00 p.m. may not be returned until the following business day.  We are closed weekends and major holidays. You have access to a nurse at all times for urgent questions. Please call the main number to the clinic Dept: [phone] and follow the prompts.   For any non-urgent questions, you may also contact your provider using MyChart. We now offer e-Visits for anyone 53 and older to request care online for non-urgent symptoms. For details visit [email or web address].   Also download the MyChart app! Go to the app store, search "MyChart", open the app, select [facility], and log in with your MyChart username and password.

## 2022-12-20 NOTE — Research (Signed)
I2979, ICE COMPRESS: RANDOMIZED TRIAL OF LIMB CRYOCOMPRESSION VERSUS CONTINUOUS COMPRESSION VERSUS LOW CYCLIC COMPRESSION FOR THE PREVENTION  OF TAXANE-INDUCED PERIPHERAL NEUROPATHY   Patient arrives today Accompanied by her daughter  for the Week 6 assessments.    PROs: Per study protocol, all PROs required for this visit were completed prior to other study activities and completeness has been verified.     LABS: Mandatory and optional labs are collected per consent and study protocol: Patient [facility] tolerated well without complaint.   MEDICATION REVIEW: Patient reviews and verifies the current medication list is correct.  MD/PROVIDER VISIT: Patient sees Dr Al Pimple for today's visit, who also provided AE attribution.   ADVERSE EVENTS: Patient [facility] reports AE as below.  ADVERSE EVENT LOGZETHA HSIAO [id number]  12/20/2022  Adverse Event Log  Study/Protocol: S2205, ICE COMPRESS: RANDOMIZED TRIAL OF LIMB CRYOCOMPRESSION VERSUS CONTINUOUS COMPRESSION VERSUS LOW CYCLIC COMPRESSION FOR THE PREVENTION  OF TAXANE-INDUCED PERIPHERAL NEUROPATHY Cycle: 6 Week  Event Grade Onset Date Resolved Date Drug Name Attribution Treatment Comments  Fatigue Grade 2 11/26/22  Paclitaxel Probably  D/T taxane                                                NEURO ASSESSMENT: The neuro assessment was completed by this nurse. Patient [facility] tolerated all testing without complaint.   GIFT CARD: This study does not provide visit compensation.   DISPOSITION: Upon completion off all study requirements, patient remained in the exam room to see Dr Al Pimple.   The patient was thanked for their time and continued voluntary participation in this study. Patient [facility] has been provided direct contact information and is encouraged to contact this Nurse for any needs or questions.  Margret Chance Matie Dimaano, RN, BSN, [facility]  [facility]  [facility] Clinical Research  Nurse [facility] Direct Dial [phone]  Pager [phone] 12/20/2022 11:09 AM

## 2022-12-22 ENCOUNTER — Other Ambulatory Visit: Payer: Self-pay

## 2022-12-24 ENCOUNTER — Other Ambulatory Visit: Payer: Self-pay

## 2022-12-24 MED FILL — Dexamethasone Sodium Phosphate Inj 100 MG/10ML: INTRAMUSCULAR | Qty: 1 | Status: AC

## 2022-12-27 ENCOUNTER — Inpatient Hospital Stay: Payer: Medicare Other

## 2022-12-27 ENCOUNTER — Encounter: Payer: Self-pay | Admitting: *Deleted

## 2022-12-27 ENCOUNTER — Inpatient Hospital Stay: Payer: Medicare Other | Admitting: Hematology and Oncology

## 2022-12-27 ENCOUNTER — Other Ambulatory Visit: Payer: Self-pay

## 2022-12-27 VITALS — BP 140/74 | HR 92 | Temp 97.8°F | Resp 17 | Wt 166.0 lb

## 2022-12-27 DIAGNOSIS — Z5111 Encounter for antineoplastic chemotherapy: Secondary | ICD-10-CM | POA: Diagnosis not present

## 2022-12-27 DIAGNOSIS — C50411 Malignant neoplasm of upper-outer quadrant of right female breast: Secondary | ICD-10-CM

## 2022-12-27 DIAGNOSIS — Z95828 Presence of other vascular implants and grafts: Secondary | ICD-10-CM

## 2022-12-27 LAB — CBC WITH DIFFERENTIAL (CANCER CENTER ONLY)
Abs Immature Granulocytes: 0.11 10*3/uL — ABNORMAL HIGH (ref 0.00–0.07)
Basophils Absolute: 0.1 10*3/uL (ref 0.0–0.1)
Basophils Relative: 1 %
Eosinophils Absolute: 0.4 10*3/uL (ref 0.0–0.5)
Eosinophils Relative: 6 %
HCT: 30.5 % — ABNORMAL LOW (ref 36.0–46.0)
Hemoglobin: 9.9 g/dL — ABNORMAL LOW (ref 12.0–15.0)
Immature Granulocytes: 2 %
Lymphocytes Relative: 17 %
Lymphs Abs: 1.2 10*3/uL (ref 0.7–4.0)
MCH: 26.5 pg (ref 26.0–34.0)
MCHC: 32.5 g/dL (ref 30.0–36.0)
MCV: 81.8 fL (ref 80.0–100.0)
Monocytes Absolute: 0.6 10*3/uL (ref 0.1–1.0)
Monocytes Relative: 8 %
Neutro Abs: 4.6 10*3/uL (ref 1.7–7.7)
Neutrophils Relative %: 66 %
Platelet Count: 356 10*3/uL (ref 150–400)
RBC: 3.73 MIL/uL — ABNORMAL LOW (ref 3.87–5.11)
RDW: 16.6 % — ABNORMAL HIGH (ref 11.5–15.5)
WBC Count: 7 10*3/uL (ref 4.0–10.5)
nRBC: 0 % (ref 0.0–0.2)

## 2022-12-27 LAB — CMP (CANCER CENTER ONLY)
ALT: 15 U/L (ref 0–44)
AST: 15 U/L (ref 15–41)
Albumin: 3.9 g/dL (ref 3.5–5.0)
Alkaline Phosphatase: 87 U/L (ref 38–126)
Anion gap: 5 (ref 5–15)
BUN: 13 mg/dL (ref 8–23)
CO2: 28 mmol/L (ref 22–32)
Calcium: 8.8 mg/dL — ABNORMAL LOW (ref 8.9–10.3)
Chloride: 101 mmol/L (ref 98–111)
Creatinine: 0.88 mg/dL (ref 0.44–1.00)
GFR, Estimated: >60 mL/min (ref >60)
Glucose, Bld: 89 mg/dL (ref 70–99)
Potassium: 4.1 mmol/L (ref 3.5–5.1)
Sodium: 134 mmol/L — ABNORMAL LOW (ref 135–145)
Total Bilirubin: 0.3 mg/dL (ref 0.3–1.2)
Total Protein: 6.3 g/dL — ABNORMAL LOW (ref 6.5–8.1)

## 2022-12-27 MED ORDER — HEPARIN SOD (PORK) LOCK FLUSH 100 UNIT/ML IV SOLN
500.0000 [IU] | Freq: Once | INTRAVENOUS | Status: AC | PRN
  Administered 2022-12-27: 500 [IU]

## 2022-12-27 MED ORDER — FAMOTIDINE IN NACL 20-0.9 MG/50ML-% IV SOLN
20.0000 mg | Freq: Once | INTRAVENOUS | Status: AC
  Administered 2022-12-27: 20 mg via INTRAVENOUS
  Filled 2022-12-27: qty 50

## 2022-12-27 MED ORDER — SODIUM CHLORIDE 0.9 % IV SOLN
10.0000 mg | Freq: Once | INTRAVENOUS | Status: AC
  Administered 2022-12-27: 10 mg via INTRAVENOUS
  Filled 2022-12-27: qty 10

## 2022-12-27 MED ORDER — DIPHENHYDRAMINE HCL 50 MG/ML IJ SOLN
50.0000 mg | Freq: Once | INTRAMUSCULAR | Status: AC
  Administered 2022-12-27: 50 mg via INTRAVENOUS
  Filled 2022-12-27: qty 1

## 2022-12-27 MED ORDER — SODIUM CHLORIDE 0.9% FLUSH
10.0000 mL | Freq: Once | INTRAVENOUS | Status: AC
  Administered 2022-12-27: 10 mL

## 2022-12-27 MED ORDER — SODIUM CHLORIDE 0.9 % IV SOLN
80.0000 mg/m2 | Freq: Once | INTRAVENOUS | Status: AC
  Administered 2022-12-27: 144 mg via INTRAVENOUS
  Filled 2022-12-27: qty 24

## 2022-12-27 MED ORDER — ACETAMINOPHEN 325 MG PO TABS
650.0000 mg | ORAL_TABLET | Freq: Once | ORAL | Status: AC
  Administered 2022-12-27: 650 mg via ORAL
  Filled 2022-12-27: qty 2

## 2022-12-27 MED ORDER — SODIUM CHLORIDE 0.9 % IV SOLN: Freq: Once | INTRAVENOUS | Status: AC

## 2022-12-27 MED ORDER — SODIUM CHLORIDE 0.9% FLUSH
10.0000 mL | INTRAVENOUS | Status: DC | PRN
  Administered 2022-12-27: 10 mL

## 2022-12-27 MED ORDER — TRASTUZUMAB-DTTB CHEMO 150 MG IV SOLR
2.0000 mg/kg | Freq: Once | INTRAVENOUS | Status: AC
  Administered 2022-12-27: 150 mg via INTRAVENOUS
  Filled 2022-12-27: qty 7.14

## 2022-12-31 MED FILL — Dexamethasone Sodium Phosphate Inj 100 MG/10ML: INTRAMUSCULAR | Qty: 1 | Status: AC

## 2023-01-03 ENCOUNTER — Other Ambulatory Visit: Payer: Self-pay | Admitting: Hematology and Oncology

## 2023-01-03 ENCOUNTER — Other Ambulatory Visit: Payer: Self-pay

## 2023-01-03 ENCOUNTER — Ambulatory Visit: Payer: Medicare Other | Attending: General Surgery

## 2023-01-03 ENCOUNTER — Inpatient Hospital Stay: Payer: Medicare Other

## 2023-01-03 ENCOUNTER — Inpatient Hospital Stay (HOSPITAL_BASED_OUTPATIENT_CLINIC_OR_DEPARTMENT_OTHER): Payer: Medicare Other | Admitting: Hematology and Oncology

## 2023-01-03 VITALS — BP 152/87 | HR 89 | Resp 16

## 2023-01-03 VITALS — BP 132/63 | HR 95 | Temp 97.9°F | Resp 16 | Ht 63.0 in | Wt 167.3 lb

## 2023-01-03 VITALS — Wt 166.2 lb

## 2023-01-03 DIAGNOSIS — Z483 Aftercare following surgery for neoplasm: Secondary | ICD-10-CM | POA: Insufficient documentation

## 2023-01-03 DIAGNOSIS — Z95828 Presence of other vascular implants and grafts: Secondary | ICD-10-CM

## 2023-01-03 DIAGNOSIS — C50411 Malignant neoplasm of upper-outer quadrant of right female breast: Secondary | ICD-10-CM | POA: Diagnosis not present

## 2023-01-03 DIAGNOSIS — Z5111 Encounter for antineoplastic chemotherapy: Secondary | ICD-10-CM | POA: Diagnosis not present

## 2023-01-03 DIAGNOSIS — Z170 Estrogen receptor positive status [ER+]: Secondary | ICD-10-CM

## 2023-01-03 LAB — CBC WITH DIFFERENTIAL (CANCER CENTER ONLY)
Abs Immature Granulocytes: 0.07 10*3/uL (ref 0.00–0.07)
Basophils Absolute: 0.1 10*3/uL (ref 0.0–0.1)
Basophils Relative: 1 %
Eosinophils Absolute: 0.2 10*3/uL (ref 0.0–0.5)
Eosinophils Relative: 4 %
HCT: 30.4 % — ABNORMAL LOW (ref 36.0–46.0)
Hemoglobin: 9.9 g/dL — ABNORMAL LOW (ref 12.0–15.0)
Immature Granulocytes: 1 %
Lymphocytes Relative: 18 %
Lymphs Abs: 0.9 10*3/uL (ref 0.7–4.0)
MCH: 27 pg (ref 26.0–34.0)
MCHC: 32.6 g/dL (ref 30.0–36.0)
MCV: 82.8 fL (ref 80.0–100.0)
Monocytes Absolute: 0.4 10*3/uL (ref 0.1–1.0)
Monocytes Relative: 8 %
Neutro Abs: 3.6 10*3/uL (ref 1.7–7.7)
Neutrophils Relative %: 68 %
Platelet Count: 294 10*3/uL (ref 150–400)
RBC: 3.67 MIL/uL — ABNORMAL LOW (ref 3.87–5.11)
RDW: 17.2 % — ABNORMAL HIGH (ref 11.5–15.5)
WBC Count: 5.3 10*3/uL (ref 4.0–10.5)
nRBC: 0 % (ref 0.0–0.2)

## 2023-01-03 LAB — CMP (CANCER CENTER ONLY)
ALT: 16 U/L (ref 0–44)
AST: 15 U/L (ref 15–41)
Albumin: 4 g/dL (ref 3.5–5.0)
Alkaline Phosphatase: 81 U/L (ref 38–126)
Anion gap: 6 (ref 5–15)
BUN: 13 mg/dL (ref 8–23)
CO2: 27 mmol/L (ref 22–32)
Calcium: 9.1 mg/dL (ref 8.9–10.3)
Chloride: 102 mmol/L (ref 98–111)
Creatinine: 0.86 mg/dL (ref 0.44–1.00)
GFR, Estimated: >60 mL/min (ref >60)
Glucose, Bld: 126 mg/dL — ABNORMAL HIGH (ref 70–99)
Potassium: 4 mmol/L (ref 3.5–5.1)
Sodium: 135 mmol/L (ref 135–145)
Total Bilirubin: 0.3 mg/dL (ref 0.3–1.2)
Total Protein: 6.3 g/dL — ABNORMAL LOW (ref 6.5–8.1)

## 2023-01-03 MED ORDER — SODIUM CHLORIDE 0.9 % IV SOLN
80.0000 mg/m2 | Freq: Once | INTRAVENOUS | Status: AC
  Administered 2023-01-03: 144 mg via INTRAVENOUS
  Filled 2023-01-03: qty 24

## 2023-01-03 MED ORDER — HEPARIN SOD (PORK) LOCK FLUSH 100 UNIT/ML IV SOLN
500.0000 [IU] | Freq: Once | INTRAVENOUS | Status: AC | PRN
  Administered 2023-01-03: 500 [IU]

## 2023-01-03 MED ORDER — DIPHENHYDRAMINE HCL 50 MG/ML IJ SOLN
50.0000 mg | Freq: Once | INTRAMUSCULAR | Status: AC
  Administered 2023-01-03: 50 mg via INTRAVENOUS
  Filled 2023-01-03: qty 1

## 2023-01-03 MED ORDER — ACETAMINOPHEN 325 MG PO TABS
650.0000 mg | ORAL_TABLET | Freq: Once | ORAL | Status: AC
  Administered 2023-01-03: 650 mg via ORAL
  Filled 2023-01-03: qty 2

## 2023-01-03 MED ORDER — SODIUM CHLORIDE 0.9 % IV SOLN
10.0000 mg | Freq: Once | INTRAVENOUS | Status: AC
  Administered 2023-01-03: 10 mg via INTRAVENOUS
  Filled 2023-01-03: qty 10

## 2023-01-03 MED ORDER — FAMOTIDINE IN NACL 20-0.9 MG/50ML-% IV SOLN
20.0000 mg | Freq: Once | INTRAVENOUS | Status: AC
  Administered 2023-01-03: 20 mg via INTRAVENOUS
  Filled 2023-01-03: qty 50

## 2023-01-03 MED ORDER — SODIUM CHLORIDE 0.9% FLUSH
10.0000 mL | INTRAVENOUS | Status: DC | PRN
  Administered 2023-01-03: 10 mL

## 2023-01-03 MED ORDER — TRASTUZUMAB-DTTB CHEMO 150 MG IV SOLR
2.0000 mg/kg | Freq: Once | INTRAVENOUS | Status: AC
  Administered 2023-01-03: 150 mg via INTRAVENOUS
  Filled 2023-01-03: qty 7.14

## 2023-01-03 MED ORDER — SODIUM CHLORIDE 0.9 % IV SOLN: Freq: Once | INTRAVENOUS | Status: AC

## 2023-01-03 MED ORDER — SODIUM CHLORIDE 0.9% FLUSH
10.0000 mL | Freq: Once | INTRAVENOUS | Status: AC
  Administered 2023-01-03: 10 mL

## 2023-01-03 NOTE — Therapy (Signed)
OUTPATIENT PHYSICAL THERAPY SOZO SCREENING NOTE   Patient Name: Judy Morrison MRN: [id number] DOB:October 27, 1950, 72 y.o., female Today's Date: 01/03/2023  PCP: Medicine, [facility] REFERRING PROVIDER: [name], MD   PT End of Session - 01/03/23 0920     Visit Number 4   # unchanged due to screen only   PT Start Time 0919    PT Stop Time 0923    PT Time Calculation (min) 4 min    Activity Tolerance Patient tolerated treatment well    Behavior During Therapy [facility] for tasks assessed/performed             Past Medical History:  Diagnosis Date   Anemia    Anxiety    Arthritis    Depressed    Dermatitis    rare form, on prednisone   GERD (gastroesophageal reflux disease)    controlled with Omeprazole   Glaucoma    Torn rotator cuff    RIGHT    Urinary tract infection    dx 05-26-17 took 1 week cipro BID completed 06-02-17.   Past Surgical History:  Procedure Laterality Date   ADENOIDECTOMY     APPENDECTOMY     arthroscopic right knee      BREAST BIOPSY Right 09/01/2022   MM RT BREAST BX W LOC DEV 1ST LESION IMAGE BX SPEC STEREO GUIDE 09/01/2022 [facility]   BREAST BIOPSY  10/01/2022   MM RT RADIOACTIVE SEED LOC MAMMO GUIDE 10/01/2022 [facility]   BREAST LUMPECTOMY WITH RADIOACTIVE SEED AND SENTINEL LYMPH NODE BIOPSY Right 10/05/2022   Procedure: RIGHT BREAST LUMPECTOMY WITH RADIOACTIVE SEED AND AXILLARY SENTINEL LYMPH NODE BIOPSY;  Surgeon: [name], MD;  Location: [facility];  Service: General;  Laterality: Right;   CHOLECYSTECTOMY     COLONOSCOPY  06/06/2017   Pyrtle   Pelvic sling     x2   POLYPECTOMY     PORT A CATH REVISION N/A 11/22/2022   Procedure: PORT A CATH REVISION;  Surgeon: [name], MD;  Location: [facility] OR;  Service: General;  Laterality: N/A;   PORTACATH PLACEMENT Left 10/05/2022   Procedure: INSERTION PORT-A-CATH;  Surgeon: [name], MD;  Location: [facility];  Service: General;  Laterality: Left;   ROTATOR CUFF REPAIR Right    TONSILLECTOMY     TOTAL KNEE ARTHROPLASTY Right 08/12/2017   Procedure: RIGHT TOTAL KNEE ARTHROPLASTY;  Surgeon: Eugenia Mcalpine, MD;  Location: [facility];  Service: Orthopedics;  Laterality: Right;   TOTAL KNEE ARTHROPLASTY Left 01/06/2018   Procedure: LEFT TOTAL KNEE ARTHROPLASTY;  Surgeon: Eugenia Mcalpine, MD;  Location: [facility];  Service: Orthopedics;  Laterality: Left;   UPPER GASTROINTESTINAL ENDOSCOPY     WISDOM TOOTH EXTRACTION     Patient Active Problem List   Diagnosis Date Noted   Port-A-Cath in place 11/15/2022   Genetic testing 10/04/2022   Monoallelic mutation of ATM gene 40/98/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   Malignant neoplasm of upper-outer quadrant of right breast in female, estrogen receptor positive 09/10/2022   Rash and other nonspecific skin eruption 09/15/2020   Allergic contact dermatitis 09/15/2020   Pruritus 08/14/2020   Dizziness 05/10/2018   Atypical chest pain 05/10/2018   Primary osteoarthritis of left knee 01/06/2018   S/P knee replacement 08/12/2017   Incomplete tear of right rotator cuff 05/19/2017   Osteopenia 05/19/2017   Urge incontinence of urine 05/19/2017   Primary open angle glaucoma (POAG) 12/30/2016   Primary osteoarthritis of both knees 12/30/2016   Recurrent  oral herpes simplex infection 12/30/2016   Tubular adenoma of colon 12/30/2016   Hypothyroidism 06/25/2016   Right foot pain 12/19/2014   Anxiety 05/21/2011   Seasonal allergies 05/21/2011    REFERRING DIAG: right breast cancer at risk for lymphedema  THERAPY DIAG:  Aftercare following surgery for neoplasm  PERTINENT HISTORY: Patient was diagnosed on 08/02/2022 with right grade 2 invasive ductal carcinoma breast cancer. It measures 1.5 cm and is located in the upper outer quadrant. It is triple positive with a Ki67 of 5%. She had a rotator cuff repair in her right shoulder 10/01/2021 which continues to be problematic. She had  a right lumpectomy with SLNB on 10/05/2022   PRECAUTIONS: right UE Lymphedema risk, None  SUBJECTIVE: Pt returns for her 3 month L-Dex screen .  PAIN:  Are you having pain? No  SOZO SCREENING: Patient was assessed today using the SOZO machine to determine the lymphedema index score. This was compared to her baseline score. It was determined that she is within the recommended range when compared to her baseline and no further action is needed at this time. She will continue SOZO screenings. These are done every 3 months for 2 years post operatively followed by every 6 months for 2 years, and then annually.   L-DEX FLOWSHEETS - 01/03/23 0900       L-DEX LYMPHEDEMA SCREENING   Measurement Type Unilateral    L-DEX MEASUREMENT EXTREMITY Upper Extremity    POSITION  Standing    DOMINANT SIDE Right    At Risk Side Right    BASELINE SCORE (UNILATERAL) -3.4    L-DEX SCORE (UNILATERAL) -0.2    VALUE CHANGE (UNILAT) 3.2              Hermenia Bers, PTA 01/03/2023, 9:24 AM

## 2023-01-03 NOTE — Patient Instructions (Signed)
[facility]  Discharge Instructions: Thank you for choosing [facility] to provide your oncology and hematology care.   If you have a lab appointment with the Cancer Center, please go directly to the Cancer Center and check in at the registration area.   Wear comfortable clothing and clothing appropriate for easy access to any Portacath or PICC line.   We strive to give you quality time with your provider. You may need to reschedule your appointment if you arrive late (15 or more minutes).  Arriving late affects you and other patients whose appointments are after yours.  Also, if you miss three or more appointments without notifying the office, you may be dismissed from the clinic at the provider's discretion.      For prescription refill requests, have your pharmacy contact our office and allow 72 hours for refills to be completed.    Today you received the following chemotherapy and/or immunotherapy agents: Ontruzant, Paclitaxel.       To help prevent nausea and vomiting after your treatment, we encourage you to take your nausea medication as directed.  BELOW ARE SYMPTOMS THAT SHOULD BE REPORTED IMMEDIATELY: *FEVER GREATER THAN 100.4 F (38 C) OR HIGHER *CHILLS OR SWEATING *NAUSEA AND VOMITING THAT IS NOT CONTROLLED WITH YOUR NAUSEA MEDICATION *UNUSUAL SHORTNESS OF BREATH *UNUSUAL BRUISING OR BLEEDING *URINARY PROBLEMS (pain or burning when urinating, or frequent urination) *BOWEL PROBLEMS (unusual diarrhea, constipation, pain near the anus) TENDERNESS IN MOUTH AND THROAT WITH OR WITHOUT PRESENCE OF ULCERS (sore throat, sores in mouth, or a toothache) UNUSUAL RASH, SWELLING OR PAIN  UNUSUAL VAGINAL DISCHARGE OR ITCHING   Items with * indicate a potential emergency and should be followed up as soon as possible or go to the Emergency Department if any problems should occur.  Please show the CHEMOTHERAPY ALERT CARD or IMMUNOTHERAPY ALERT  CARD at check-in to the Emergency Department and triage nurse.  Should you have questions after your visit or need to cancel or reschedule your appointment, please contact [facility] [facility]  Dept: [phone]  and follow the prompts.  Office hours are 8:00 a.m. to 4:30 p.m. Monday - Friday. Please note that voicemails left after 4:00 p.m. may not be returned until the following business day.  We are closed weekends and major holidays. You have access to a nurse at all times for urgent questions. Please call the main number to the clinic Dept: [phone] and follow the prompts.   For any non-urgent questions, you may also contact your provider using MyChart. We now offer e-Visits for anyone 18 and older to request care online for non-urgent symptoms. For details visit [email or web address].   Also download the MyChart app! Go to the app store, search "MyChart", open the app, select [place], and log in with your MyChart username and password.   

## 2023-01-03 NOTE — Progress Notes (Unsigned)
[facility] Follow up:    Medicine, [facility] [address] Vella Raring [place] [address]   DIAGNOSIS:  Cancer Staging  Malignant neoplasm of upper-outer quadrant of right breast in female, estrogen receptor positive Staging form: Breast, AJCC 8th Edition - Clinical stage from 09/15/2022: Stage IA (cT1c, cN0, cM0, G2, ER+, PR+, HER2+) - Unsigned Stage prefix: Initial diagnosis Histologic grading system: 3 grade system Laterality: Right Staged by: Pathologist and managing physician Stage used in treatment planning: Yes National guidelines used in treatment planning: Yes Type of national guideline used in treatment planning: NCCN - Pathologic stage from 10/05/2022: Stage IA (pT1c, pN0, cM0, G2, ER+, PR+, HER2+) - Signed by Loa Socks, NP on 11/08/2022 Stage prefix: Initial diagnosis Histologic grading system: 3 grade system   SUMMARY OF ONCOLOGIC HISTORY: Oncology History  Malignant neoplasm of upper-outer quadrant of right breast in female, estrogen receptor positive  08/02/2022 Mammogram   In the right breast, possible distortion warrants further evaluation. In the left breast, no findings suspicious for malignancy. Diagnostic mammogram showed persistent subtle distortion central slightly LATERAL aspect of the RIGHT breast warranting tissue diagnosis.     09/01/2022 Pathology Results   Final pathology showed grade 2 invasive mammary carcinoma, prognostic showed ER 90% positive strong staining PR 90% positive strong staining, Ki-67 of 5% and HER2 3+ by [facility]   09/10/2022 Initial Diagnosis   Malignant neoplasm of upper-outer quadrant of right breast in female, estrogen receptor positive (HCC)    Genetic Testing   [facility] CancerNext-Expanded Panel+RNA was Positive. 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 Report date  is 10/04/2022.  The CancerNext-Expanded gene panel offered by [facility] and includes sequencing, rearrangement, and RNA analysis for the following 77 genes: AIP, ALK, APC, ATM, AXIN2, BAP1, BARD1, BLM, BMPR1A, BRCA1, BRCA2, BRIP1, CDC73, CDH1, CDK4, CDKN1B, CDKN2A, CHEK2, CTNNA1, DICER1, FANCC, FH, FLCN, GALNT12, KIF1B, LZTR1, MAX, MEN1, MET, MLH1, MSH2, MSH3, MSH6, MUTYH, NBN, NF1, NF2, NTHL1, PALB2, PHOX2B, PMS2, POT1, PRKAR1A, PTCH1, PTEN, RAD51C, RAD51D, RB1, RECQL, RET, SDHA, SDHAF2, SDHB, SDHC, SDHD, SMAD4, SMARCA4, SMARCB1, SMARCE1, STK11, SUFU, TMEM127, TP53, TSC1, TSC2, VHL and XRCC2 (sequencing and deletion/duplication); EGFR, EGLN1, HOXB13, KIT, MITF, PDGFRA, POLD1, and POLE (sequencing only); EPCAM and GREM1 (deletion/duplication only).    10/05/2022 Surgery   Right breast lumpectomy with Dr. 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  Grade 2, 2 sentinel lymph nodes negative for metastasis.  Margins negative.  T1c, N0.   10/05/2022 Cancer Staging   Staging form: Breast, AJCC 8th Edition - Pathologic stage from 10/05/2022: Stage IA (pT1c, pN0, cM0, G2, ER+, PR+, HER2+) - Signed by Loa Socks, NP on 11/08/2022 Stage prefix: Initial diagnosis Histologic grading system: 3 grade system   11/08/2022 -  Chemotherapy   Patient is on Treatment Plan : BREAST Paclitaxel + Trastuzumab q7d / Trastuzumab q21d       CURRENT THERAPY: Taxol/Herceptin  INTERVAL HISTORY:  Eulis Foster Doyel 72 y.o. female returns for follow-up prior to receiving weekly cycle of Taxol and Herceptin. Ms. Mccutcheon is here for follow-up.   She continues to do really well except for some mental fogginess according to daughter.  Most of the times it is getting lost with directions especially when she is in a crunch timeout.  Other than this patient denies any complaints at all. No nausea, vomiting, diarrhea. No  neuropathy No change in breathing, bowel habits or urinary  habits No new neurological complaints Rest of the pertinent 10 point ROS reviewed and neg  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   Malignant neoplasm of upper-outer quadrant of right breast in female, estrogen receptor positive 09/10/2022   Rash and other nonspecific skin eruption 09/15/2020   Allergic contact dermatitis 09/15/2020   Pruritus 08/14/2020   Dizziness 05/10/2018   Atypical chest pain 05/10/2018   Primary osteoarthritis of left knee 01/06/2018   S/P knee replacement 08/12/2017   Incomplete tear of right rotator cuff 05/19/2017   Osteopenia 05/19/2017   Urge incontinence of urine 05/19/2017   Primary open angle glaucoma (POAG) 12/30/2016   Primary osteoarthritis of both knees 12/30/2016   Recurrent oral herpes simplex infection 12/30/2016   Tubular adenoma of colon 12/30/2016   Hypothyroidism 06/25/2016   Right foot pain 12/19/2014   Anxiety 05/21/2011   Seasonal allergies 05/21/2011    is allergic to penicillin g, cephalexin, codeine, erythromycin, and penicillins.  MEDICAL HISTORY: Past Medical History:  Diagnosis Date   Anemia    Anxiety    Arthritis    Depressed    Dermatitis    rare form, on prednisone   GERD (gastroesophageal reflux disease)    controlled with Omeprazole   Glaucoma    Torn rotator cuff    RIGHT    Urinary tract infection    dx 05-26-17 took 1 week cipro BID completed 06-02-17.    SURGICAL HISTORY: Past Surgical History:  Procedure Laterality Date   ADENOIDECTOMY     APPENDECTOMY     arthroscopic right knee      BREAST BIOPSY Right 09/01/2022   MM RT BREAST BX W LOC DEV 1ST LESION IMAGE BX SPEC STEREO GUIDE 09/01/2022 [facility]   BREAST BIOPSY  10/01/2022   MM RT RADIOACTIVE SEED LOC MAMMO GUIDE 10/01/2022 [facility]   BREAST LUMPECTOMY WITH RADIOACTIVE SEED AND SENTINEL LYMPH NODE BIOPSY  Right 10/05/2022   Procedure: RIGHT BREAST LUMPECTOMY WITH RADIOACTIVE SEED AND AXILLARY SENTINEL LYMPH NODE BIOPSY;  Surgeon: [name], MD;  Location: [facility];  Service: General;  Laterality: Right;   CHOLECYSTECTOMY     COLONOSCOPY  06/06/2017   Pyrtle   Pelvic sling     x2   POLYPECTOMY     PORT A CATH REVISION N/A 11/22/2022   Procedure: PORT A CATH REVISION;  Surgeon: [name], MD;  Location: [facility];  Service: General;  Laterality: N/A;   PORTACATH PLACEMENT Left 10/05/2022   Procedure: INSERTION PORT-A-CATH;  Surgeon: [name], MD;  Location: [facility];  Service: General;  Laterality: Left;   ROTATOR CUFF REPAIR Right    TONSILLECTOMY     TOTAL KNEE ARTHROPLASTY Right 08/12/2017   Procedure: RIGHT TOTAL KNEE ARTHROPLASTY;  Surgeon: Eugenia Mcalpine, MD;  Location: [facility];  Service: Orthopedics;  Laterality: Right;   TOTAL KNEE ARTHROPLASTY Left 01/06/2018   Procedure: LEFT TOTAL KNEE ARTHROPLASTY;  Surgeon: Eugenia Mcalpine, MD;  Location[facility];  Service: Orthopedics;  Laterality: Left;   UPPER GASTROINTESTINAL ENDOSCOPY     WISDOM TOOTH EXTRACTION      SOCIAL HISTORY: Social History   Socioeconomic History   Marital status: Single    Spouse name: Not on file   Number of children: Not on file   Years of education:  Not on file   Highest education level: Not on file  Occupational History   Not on file  Tobacco Use   Smoking status: Former    Types: Cigarettes    Quit date: 22    Years since quitting: 38.3   Smokeless tobacco: Never  Vaping Use   Vaping Use: Never used  Substance and Sexual Activity   Alcohol use: Yes    Alcohol/week: 2.0 standard drinks of alcohol    Types: 2 Cans of beer per week   Drug use: No   Sexual activity: Not on file  Other Topics Concern   Not on file  Social History Narrative   Not on file   Social Determinants of Health   Financial Resource Strain: Not on file  Food  Insecurity: No Food Insecurity (09/15/2022)   Hunger Vital Sign    Worried About Running Out of Food in the Last Year: Never true    Ran Out of Food in the Last Year: Never true  Transportation Needs: No Transportation Needs (09/15/2022)   PRAPARE - Administrator, Civil Service (Medical): No    Lack of Transportation (Non-Medical): No  Physical Activity: Not on file  Stress: Not on file  Social Connections: Not on file  Intimate Partner Violence: Not on file    FAMILY HISTORY: Family History  Problem Relation Age of Onset   Bowel Disease Mother        ishemic bowel   Lung cancer Mother        smoked   Cervical cancer Paternal Aunt    Cancer Maternal Grandfather        unknown type   Lung cancer Paternal Grandfather        smoked   Colon cancer Neg Hx    Breast cancer Neg Hx    Esophageal cancer Neg Hx    Pancreatic cancer Neg Hx    Prostate cancer Neg Hx    Rectal cancer Neg Hx    Stomach cancer Neg Hx    Allergic rhinitis Neg Hx    Asthma Neg Hx    Eczema Neg Hx    Urticaria Neg Hx    Colon polyps Neg Hx     Review of Systems  Constitutional:  Negative for appetite change, chills, fatigue, fever and unexpected weight change.  HENT:   Negative for hearing loss, lump/mass and trouble swallowing.   Eyes:  Negative for eye problems and icterus.  Respiratory:  Negative for chest tightness, cough and shortness of breath.   Cardiovascular:  Negative for chest pain, leg swelling and palpitations.  Gastrointestinal:  Negative for abdominal distention, abdominal pain, constipation, diarrhea, nausea and vomiting.  Endocrine: Negative for hot flashes.  Genitourinary:  Negative for difficulty urinating.   Musculoskeletal:  Negative for arthralgias.  Skin:  Negative for itching and rash.  Neurological:  Negative for dizziness, extremity weakness, headaches and numbness.  Hematological:  Negative for adenopathy. Does not bruise/bleed easily.  Psychiatric/Behavioral:   Negative for depression. The patient is not nervous/anxious.       PHYSICAL EXAMINATION  ECOG PERFORMANCE STATUS: 1 - Symptomatic but completely ambulatory  Vitals:   01/03/23 1215  BP: 132/63  Pulse: 95  Resp: 16  Temp: 97.9 F (36.6 C)  SpO2: 100%    Physical Exam Constitutional:      General: She is not in acute distress.    Appearance: Normal appearance. She is not toxic-appearing.  HENT:     Head: Normocephalic  and atraumatic.  Eyes:     General: No scleral icterus. Cardiovascular:     Rate and Rhythm: Normal rate and regular rhythm.     Pulses: Normal pulses.     Heart sounds: Normal heart sounds.  Pulmonary:     Effort: Pulmonary effort is normal.     Breath sounds: Normal breath sounds.  Abdominal:     General: Abdomen is flat. Bowel sounds are normal. There is no distension.     Palpations: Abdomen is soft.     Tenderness: There is no abdominal tenderness.  Musculoskeletal:        General: No swelling.     Cervical back: Neck supple.  Lymphadenopathy:     Cervical: No cervical adenopathy.  Skin:    General: Skin is warm and dry.     Findings: No rash.  Neurological:     General: No focal deficit present.     Mental Status: She is alert.  Psychiatric:        Mood and Affect: Mood normal.        Behavior: Behavior normal.     LABORATORY DATA:  CBC    Component Value Date/Time   WBC 5.3 01/03/2023 1150   WBC 10.1 10/01/2021 1314   RBC 3.67 (L) 01/03/2023 1150   HGB 9.9 (L) 01/03/2023 1150   HGB 13.7 09/15/2020 1209   HCT 30.4 (L) 01/03/2023 1150   HCT 41.3 09/15/2020 1209   PLT 294 01/03/2023 1150   PLT 349 09/15/2020 1209   MCV 82.8 01/03/2023 1150   MCV 86 09/15/2020 1209   MCH 27.0 01/03/2023 1150   MCHC 32.6 01/03/2023 1150   RDW 17.2 (H) 01/03/2023 1150   RDW 12.3 09/15/2020 1209   LYMPHSABS 0.9 01/03/2023 1150   LYMPHSABS 1.2 09/15/2020 1209   MONOABS 0.4 01/03/2023 1150   EOSABS 0.2 01/03/2023 1150   EOSABS 0.2 09/15/2020 1209    BASOSABS 0.1 01/03/2023 1150   BASOSABS 0.1 09/15/2020 1209    CMP     Component Value Date/Time   NA 135 01/03/2023 1150   NA 135 09/15/2020 1209   K 4.0 01/03/2023 1150   CL 102 01/03/2023 1150   CO2 27 01/03/2023 1150   GLUCOSE 126 (H) 01/03/2023 1150   BUN 13 01/03/2023 1150   BUN 14 09/15/2020 1209   CREATININE 0.86 01/03/2023 1150   CALCIUM 9.1 01/03/2023 1150   PROT 6.3 (L) 01/03/2023 1150   PROT 6.8 09/15/2020 1209   ALBUMIN 4.0 01/03/2023 1150   ALBUMIN 4.6 09/15/2020 1209   AST 15 01/03/2023 1150   ALT 16 01/03/2023 1150   ALKPHOS 81 01/03/2023 1150   BILITOT 0.3 01/03/2023 1150   GFRNONAA >60 01/03/2023 1150   GFRAA 74 09/15/2020 1209    ASSESSMENT and THERAPY PLAN:   Malignant neoplasm of upper-outer quadrant of right breast in female, estrogen receptor positive (HCC) This is a very pleasant 72 year old postmenopausal patient with newly diagnosed right breast IDC, ER and PR positive, HER2 positive by IHC, Ki 67 of 5%? referred to breast [facility] for additional recommendations. On the original biopsy, the tumor measured about 10 mm.  She most recently underwent lumpectomy which showed an additional 3 mm of invasive carcinoma.  Given HER2 amplified breast cancer, despite the size especially with the larger sample removed during the biopsy, we have discussed about considering Taxol and Herceptin. She now continues on weekly Herceptin and Taxol.  She is doing very well on this combination.  No adverse  effects reported except for some fogginess, remembering and following simple instructions. No dose limiting toxicity reported otherwise. Physical exam unremarkable otherwise, ok to continue herceptin and taxol. We will continue to monitor the fogginess, I wonder if this is from anxiety.  RTC every week for chemo, every other week for appointments, labs same day as chemo. ECHO ordered for May.  Thank you for consulting [place] in the care of this patient.  Please do not hesitate  to contact us with any additional questions or concerns.   All questions were answered. The patient knows to call the clinic with any problems, questions or concerns. We can certainly see the patient much sooner if necessary.  Total encounter time: 30 minutes*in face-to-face visit time, chart review, lab review, care coordination, order entry, and documentation of the encounter time.  *Total Encounter Time as defined by the Centers for Medicare and Medicaid Services includes, in addition to the face-to-face time of a patient visit (documented in the note above) non-face-to-face time: obtaining and reviewing outside history, ordering and reviewing medications, tests or procedures, care coordination (communications with other health care professionals or caregivers) and documentation in the medical record.

## 2023-01-04 ENCOUNTER — Encounter: Payer: Self-pay | Admitting: Hematology and Oncology

## 2023-01-04 ENCOUNTER — Other Ambulatory Visit: Payer: Self-pay

## 2023-01-04 NOTE — Assessment & Plan Note (Signed)
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 referred to breast [facility] for additional recommendations. On the original biopsy, the tumor measured about 10 mm.  She most recently underwent lumpectomy which showed an additional 3 mm of invasive carcinoma.  Given HER2 amplified breast cancer, despite the size especially with the larger sample removed during the biopsy, we have discussed about considering Taxol and Herceptin. She now continues on weekly Herceptin and Taxol.  She is doing very well on this combination.  No adverse effects reported except for some fogginess, remembering and following simple instructions. No dose limiting toxicity reported otherwise. Physical exam unremarkable otherwise, ok to continue herceptin and taxol. We will continue to monitor the fogginess, I wonder if this is from anxiety.  RTC every week for chemo, every other week for appointments, labs same day as chemo. ECHO ordered for May.  Thank you for consulting [place] in the care of this patient.  Please do not hesitate to contact us with any additional questions or concerns.

## 2023-01-07 ENCOUNTER — Telehealth: Payer: Self-pay | Admitting: *Deleted

## 2023-01-07 MED FILL — Dexamethasone Sodium Phosphate Inj 100 MG/10ML: INTRAMUSCULAR | Qty: 1 | Status: AC

## 2023-01-07 NOTE — Telephone Encounter (Signed)
Received message from patient that she has been having some hip pain that has kind of come and go but recently has been constant. She denies any falls or injuries. Informed her that [facility] has an after hours clinic until 8pm on Fridays and Saturdays 10-3. Recommended she go and this evaluated.  Informed her that there is no appt just walk in.  She states she is going to watch her grandson's baseball game tonight but will when they open in the am. Instructed her to call me on Monday and give me an update Patient verbalized understanding.

## 2023-01-10 ENCOUNTER — Telehealth: Payer: Self-pay | Admitting: *Deleted

## 2023-01-10 ENCOUNTER — Other Ambulatory Visit: Payer: Self-pay

## 2023-01-10 ENCOUNTER — Inpatient Hospital Stay: Payer: Medicare Other

## 2023-01-10 ENCOUNTER — Ambulatory Visit: Payer: Medicare Other | Admitting: Hematology and Oncology

## 2023-01-10 VITALS — BP 137/77 | HR 88 | Temp 98.1°F | Resp 20 | Ht 63.0 in | Wt 167.2 lb

## 2023-01-10 DIAGNOSIS — Z170 Estrogen receptor positive status [ER+]: Secondary | ICD-10-CM

## 2023-01-10 DIAGNOSIS — Z95828 Presence of other vascular implants and grafts: Secondary | ICD-10-CM

## 2023-01-10 DIAGNOSIS — Z5111 Encounter for antineoplastic chemotherapy: Secondary | ICD-10-CM | POA: Diagnosis not present

## 2023-01-10 LAB — CMP (CANCER CENTER ONLY)
ALT: 20 U/L (ref 0–44)
AST: 18 U/L (ref 15–41)
Albumin: 3.9 g/dL (ref 3.5–5.0)
Alkaline Phosphatase: 82 U/L (ref 38–126)
Anion gap: 7 (ref 5–15)
BUN: 15 mg/dL (ref 8–23)
CO2: 28 mmol/L (ref 22–32)
Calcium: 9 mg/dL (ref 8.9–10.3)
Chloride: 103 mmol/L (ref 98–111)
Creatinine: 0.85 mg/dL (ref 0.44–1.00)
GFR, Estimated: >60 mL/min (ref >60)
Glucose, Bld: 107 mg/dL — ABNORMAL HIGH (ref 70–99)
Potassium: 4 mmol/L (ref 3.5–5.1)
Sodium: 138 mmol/L (ref 135–145)
Total Bilirubin: 0.3 mg/dL (ref 0.3–1.2)
Total Protein: 6 g/dL — ABNORMAL LOW (ref 6.5–8.1)

## 2023-01-10 LAB — CBC WITH DIFFERENTIAL (CANCER CENTER ONLY)
Abs Immature Granulocytes: 0.06 10*3/uL (ref 0.00–0.07)
Basophils Absolute: 0.1 10*3/uL (ref 0.0–0.1)
Basophils Relative: 1 %
Eosinophils Absolute: 0.3 10*3/uL (ref 0.0–0.5)
Eosinophils Relative: 6 %
HCT: 30.3 % — ABNORMAL LOW (ref 36.0–46.0)
Hemoglobin: 10 g/dL — ABNORMAL LOW (ref 12.0–15.0)
Immature Granulocytes: 2 %
Lymphocytes Relative: 21 %
Lymphs Abs: 0.8 10*3/uL (ref 0.7–4.0)
MCH: 27.3 pg (ref 26.0–34.0)
MCHC: 33 g/dL (ref 30.0–36.0)
MCV: 82.8 fL (ref 80.0–100.0)
Monocytes Absolute: 0.5 10*3/uL (ref 0.1–1.0)
Monocytes Relative: 11 %
Neutro Abs: 2.4 10*3/uL (ref 1.7–7.7)
Neutrophils Relative %: 59 %
Platelet Count: 326 10*3/uL (ref 150–400)
RBC: 3.66 MIL/uL — ABNORMAL LOW (ref 3.87–5.11)
RDW: 17.9 % — ABNORMAL HIGH (ref 11.5–15.5)
WBC Count: 4 10*3/uL (ref 4.0–10.5)
nRBC: 0 % (ref 0.0–0.2)

## 2023-01-10 MED ORDER — ACETAMINOPHEN 325 MG PO TABS
650.0000 mg | ORAL_TABLET | Freq: Once | ORAL | Status: AC
  Administered 2023-01-10: 650 mg via ORAL
  Filled 2023-01-10: qty 2

## 2023-01-10 MED ORDER — SODIUM CHLORIDE 0.9 % IV SOLN: Freq: Once | INTRAVENOUS | Status: AC

## 2023-01-10 MED ORDER — SODIUM CHLORIDE 0.9 % IV SOLN
10.0000 mg | Freq: Once | INTRAVENOUS | Status: AC
  Administered 2023-01-10: 10 mg via INTRAVENOUS
  Filled 2023-01-10: qty 10

## 2023-01-10 MED ORDER — SODIUM CHLORIDE 0.9% FLUSH
10.0000 mL | INTRAVENOUS | Status: DC | PRN
  Administered 2023-01-10: 10 mL

## 2023-01-10 MED ORDER — SODIUM CHLORIDE 0.9% FLUSH
10.0000 mL | Freq: Once | INTRAVENOUS | Status: AC
  Administered 2023-01-10: 10 mL

## 2023-01-10 MED ORDER — DIPHENHYDRAMINE HCL 50 MG/ML IJ SOLN
50.0000 mg | Freq: Once | INTRAMUSCULAR | Status: AC
  Administered 2023-01-10: 50 mg via INTRAVENOUS
  Filled 2023-01-10: qty 1

## 2023-01-10 MED ORDER — FAMOTIDINE IN NACL 20-0.9 MG/50ML-% IV SOLN
20.0000 mg | Freq: Once | INTRAVENOUS | Status: AC
  Administered 2023-01-10: 20 mg via INTRAVENOUS
  Filled 2023-01-10: qty 50

## 2023-01-10 MED ORDER — TRASTUZUMAB-DTTB CHEMO 150 MG IV SOLR
2.0000 mg/kg | Freq: Once | INTRAVENOUS | Status: AC
  Administered 2023-01-10: 150 mg via INTRAVENOUS
  Filled 2023-01-10: qty 7.14

## 2023-01-10 MED ORDER — SODIUM CHLORIDE 0.9 % IV SOLN
80.0000 mg/m2 | Freq: Once | INTRAVENOUS | Status: AC
  Administered 2023-01-10: 144 mg via INTRAVENOUS
  Filled 2023-01-10: qty 24

## 2023-01-10 MED ORDER — HEPARIN SOD (PORK) LOCK FLUSH 100 UNIT/ML IV SOLN
500.0000 [IU] | Freq: Once | INTRAVENOUS | Status: AC | PRN
  Administered 2023-01-10: 500 [IU]

## 2023-01-10 NOTE — Patient Instructions (Signed)
[facility]  Discharge Instructions: Thank you for choosing [facility] to provide your oncology and hematology care.   If you have a lab appointment with the Cancer Center, please go directly to the Cancer Center and check in at the registration area.   Wear comfortable clothing and clothing appropriate for easy access to any Portacath or PICC line.   We strive to give you quality time with your provider. You may need to reschedule your appointment if you arrive late (15 or more minutes).  Arriving late affects you and other patients whose appointments are after yours.  Also, if you miss three or more appointments without notifying the office, you may be dismissed from the clinic at the provider's discretion.      For prescription refill requests, have your pharmacy contact our office and allow 72 hours for refills to be completed.    Today you received the following chemotherapy and/or immunotherapy agents: Trastuzumab and Paclitaxel (Taxol)   To help prevent nausea and vomiting after your treatment, we encourage you to take your nausea medication as directed.  BELOW ARE SYMPTOMS THAT SHOULD BE REPORTED IMMEDIATELY: *FEVER GREATER THAN 100.4 F (38 C) OR HIGHER *CHILLS OR SWEATING *NAUSEA AND VOMITING THAT IS NOT CONTROLLED WITH YOUR NAUSEA MEDICATION *UNUSUAL SHORTNESS OF BREATH *UNUSUAL BRUISING OR BLEEDING *URINARY PROBLEMS (pain or burning when urinating, or frequent urination) *BOWEL PROBLEMS (unusual diarrhea, constipation, pain near the anus) TENDERNESS IN MOUTH AND THROAT WITH OR WITHOUT PRESENCE OF ULCERS (sore throat, sores in mouth, or a toothache) UNUSUAL RASH, SWELLING OR PAIN  UNUSUAL VAGINAL DISCHARGE OR ITCHING   Items with * indicate a potential emergency and should be followed up as soon as possible or go to the Emergency Department if any problems should occur.  Please show the CHEMOTHERAPY ALERT CARD or  IMMUNOTHERAPY ALERT CARD at check-in to the Emergency Department and triage nurse.  Should you have questions after your visit or need to cancel or reschedule your appointment, please contact [facility]  Dept: [phone]  and follow the prompts.  Office hours are 8:00 a.m. to 4:30 p.m. Monday - Friday. Please note that voicemails left after 4:00 p.m. may not be returned until the following business day.  We are closed weekends and major holidays. You have access to a nurse at all times for urgent questions. Please call the main number to the clinic Dept: [phone] and follow the prompts.   For any non-urgent questions, you may also contact your provider using MyChart. We now offer e-Visits for anyone 88 and older to request care online for non-urgent symptoms. For details visit [email or web address].   Also download the MyChart app! Go to the app store, search "MyChart", open the app, select , and log in with your MyChart username and password.  Trastuzumab Injection What is this medication? TRASTUZUMAB (tras TOO zoo mab) treats breast cancer and stomach cancer. It works by blocking a protein that causes cancer cells to grow and multiply. This helps to slow or stop the spread of cancer cells. This medicine may be used for other purposes; ask your health care provider or pharmacist if you have questions. COMMON BRAND NAME(S): Herceptin, Marlowe Alt, Ontruzant, Trazimera What should I tell my care team before I take this medication? They need to know if you have any of these conditions: Heart failure Lung disease An unusual or allergic reaction to trastuzumab, other medications,  foods, dyes, or preservatives Pregnant or trying to get pregnant Breast-feeding How should I use this medication? This medication is injected into a vein. It is given by your care team in a hospital or clinic setting. Talk to your care team about the  use of this medication in children. It is not approved for use in children. Overdosage: If you think you have taken too much of this medicine contact a poison control center or emergency room at once. NOTE: This medicine is only for you. Do not share this medicine with others. What if I miss a dose? Keep appointments for follow-up doses. It is important not to miss your dose. Call your care team if you are unable to keep an appointment. What may interact with this medication? Certain types of chemotherapy, such as daunorubicin, doxorubicin, epirubicin, idarubicin This list may not describe all possible interactions. Give your health care provider a list of all the medicines, herbs, non-prescription drugs, or dietary supplements you use. Also tell them if you smoke, drink alcohol, or use illegal drugs. Some items may interact with your medicine. What should I watch for while using this medication? Your condition will be monitored carefully while you are receiving this medication. This medication may make you feel generally unwell. This is not uncommon, as chemotherapy affects healthy cells as well as cancer cells. Report any side effects. Continue your course of treatment even though you feel ill unless your care team tells you to stop. This medication may increase your risk of getting an infection. Call your care team for advice if you get a fever, chills, sore throat, or other symptoms of a cold or flu. Do not treat yourself. Try to avoid being around people who are sick. Avoid taking medications that contain aspirin, acetaminophen, ibuprofen, naproxen, or ketoprofen unless instructed by your care team. These medications can hide a fever. Talk to your care team if you may be pregnant. Serious birth defects can occur if you take this medication during pregnancy and for 7 months after the last dose. You will need a negative pregnancy test before starting this medication. Contraception is recommended  while taking this medication and for 7 months after the last dose. Your care team can help you find the option that works for you. Do not breastfeed while taking this medication and for 7 months after stopping treatment. What side effects may I notice from receiving this medication? Side effects that you should report to your care team as soon as possible: Allergic reactions or angioedema--skin rash, itching or hives, swelling of the face, eyes, lips, tongue, arms, or legs, trouble swallowing or breathing Dry cough, shortness of breath or trouble breathing Heart failure--shortness of breath, swelling of the ankles, feet, or hands, sudden weight gain, unusual weakness or fatigue Infection--fever, chills, cough, or sore throat Infusion reactions--chest pain, shortness of breath or trouble breathing, feeling faint or lightheaded Side effects that usually do not require medical attention (report to your care team if they continue or are bothersome): Diarrhea Dizziness Headache Nausea Trouble sleeping Vomiting This list may not describe all possible side effects. Call your doctor for medical advice about side effects. You may report side effects to FDA at 1-800-FDA-1088. Where should I keep my medication? This medication is given in a hospital or clinic. It will not be stored at home. NOTE: This sheet is a summary. It may not cover all possible information. If you have questions about this medicine, talk to your doctor, pharmacist, or health care  provider.  2023 Elsevier/Gold Standard (2021-12-31 00:00:00) Paclitaxel Injection What is this medication? PACLITAXEL (PAK li TAX el) treats some types of cancer. It works by slowing down the growth of cancer cells. This medicine may be used for other purposes; ask your health care provider or pharmacist if you have questions. COMMON BRAND NAME(S): Onxol, Taxol What should I tell my care team before I take this medication? They need to know if you  have any of these conditions: Heart disease Liver disease Low white blood cell levels An unusual or allergic reaction to paclitaxel, other medications, foods, dyes, or preservatives If you or your partner are pregnant or trying to get pregnant Breast-feeding How should I use this medication? This medication is injected into a vein. It is given by your care team in a hospital or clinic setting. Talk to your care team about the use of this medication in children. While it may be given to children for selected conditions, precautions do apply. Overdosage: If you think you have taken too much of this medicine contact a poison control center or emergency room at once. NOTE: This medicine is only for you. Do not share this medicine with others. What if I miss a dose? Keep appointments for follow-up doses. It is important not to miss your dose. Call your care team if you are unable to keep an appointment. What may interact with this medication? Do not take this medication with any of the following: Live virus vaccines Other medications may affect the way this medication works. Talk with your care team about all of the medications you take. They may suggest changes to your treatment plan to lower the risk of side effects and to make sure your medications work as intended. This list may not describe all possible interactions. Give your health care provider a list of all the medicines, herbs, non-prescription drugs, or dietary supplements you use. Also tell them if you smoke, drink alcohol, or use illegal drugs. Some items may interact with your medicine. What should I watch for while using this medication? Your condition will be monitored carefully while you are receiving this medication. You may need blood work while taking this medication. This medication may make you feel generally unwell. This is not uncommon as chemotherapy can affect healthy cells as well as cancer cells. Report any side effects.  Continue your course of treatment even though you feel ill unless your care team tells you to stop. This medication can cause serious allergic reactions. To reduce the risk, your care team may give you other medications to take before receiving this one. Be sure to follow the directions from your care team. This medication may increase your risk of getting an infection. Call your care team for advice if you get a fever, chills, sore throat, or other symptoms of a cold or flu. Do not treat yourself. Try to avoid being around people who are sick. This medication may increase your risk to bruise or bleed. Call your care team if you notice any unusual bleeding. Be careful brushing or flossing your teeth or using a toothpick because you may get an infection or bleed more easily. If you have any dental work done, tell your dentist you are receiving this medication. Talk to your care team if you may be pregnant. Serious birth defects can occur if you take this medication during pregnancy. Talk to your care team before breastfeeding. Changes to your treatment plan may be needed. What side effects may I notice  from receiving this medication? Side effects that you should report to your care team as soon as possible: Allergic reactions--skin rash, itching, hives, swelling of the face, lips, tongue, or throat Heart rhythm changes--fast or irregular heartbeat, dizziness, feeling faint or lightheaded, chest pain, trouble breathing Increase in blood pressure Infection--fever, chills, cough, sore throat, wounds that don't heal, pain or trouble when passing urine, general feeling of discomfort or being unwell Low blood pressure--dizziness, feeling faint or lightheaded, blurry vision Low red blood cell level--unusual weakness or fatigue, dizziness, headache, trouble breathing Painful swelling, warmth, or redness of the skin, blisters or sores at the infusion site Pain, tingling, or numbness in the hands or feet Slow  heartbeat--dizziness, feeling faint or lightheaded, confusion, trouble breathing, unusual weakness or fatigue Unusual bruising or bleeding Side effects that usually do not require medical attention (report to your care team if they continue or are bothersome): Diarrhea Hair loss Joint pain Loss of appetite Muscle pain Nausea Vomiting This list may not describe all possible side effects. Call your doctor for medical advice about side effects. You may report side effects to FDA at 1-800-FDA-1088. Where should I keep my medication? This medication is given in a hospital or clinic. It will not be stored at home. NOTE: This sheet is a summary. It may not cover all possible information. If you have questions about this medicine, talk to your doctor, pharmacist, or health care provider.  2023 Elsevier/Gold Standard (2022-01-14 00:00:00)

## 2023-01-10 NOTE — Telephone Encounter (Signed)
Communication per infusion nurse -  Good morning, pt. here for Day 1 cycle 3 Trastuzumab/Taxol. She is having right hip pain rates 5/10 increases with movement. She went to [facility] urgent care over weekend and they wanted her to take a Prednisone dose pack 5 mg (21 tablets) for 6 days. They wanted her to check with Oncologist to see if it was ok to give her. Also she has Tramadol at home and wanted to know if ok to take for the pain instead of Ibuprofen? It looks like Dr. Al Pimple is in clinic so if you could ask when she returns. Thank you!  Per MD above is ok to do with current regimen.  Informed infusion nurse

## 2023-01-14 ENCOUNTER — Other Ambulatory Visit (HOSPITAL_COMMUNITY): Payer: Medicare Other

## 2023-01-14 MED FILL — Dexamethasone Sodium Phosphate Inj 100 MG/10ML: INTRAMUSCULAR | Qty: 1 | Status: AC

## 2023-01-17 ENCOUNTER — Inpatient Hospital Stay: Payer: Medicare Other

## 2023-01-17 ENCOUNTER — Telehealth: Payer: Self-pay | Admitting: Hematology and Oncology

## 2023-01-17 ENCOUNTER — Inpatient Hospital Stay: Payer: Medicare Other | Admitting: Hematology and Oncology

## 2023-01-17 NOTE — Telephone Encounter (Signed)
Spoke with patient confirming upcoming appointments change  

## 2023-01-18 ENCOUNTER — Other Ambulatory Visit: Payer: Self-pay

## 2023-01-18 DIAGNOSIS — C50411 Malignant neoplasm of upper-outer quadrant of right female breast: Secondary | ICD-10-CM

## 2023-01-18 DIAGNOSIS — M25551 Pain in right hip: Secondary | ICD-10-CM | POA: Insufficient documentation

## 2023-01-21 MED FILL — Dexamethasone Sodium Phosphate Inj 100 MG/10ML: INTRAMUSCULAR | Qty: 1 | Status: AC

## 2023-01-24 ENCOUNTER — Other Ambulatory Visit: Payer: Medicare Other

## 2023-01-24 ENCOUNTER — Inpatient Hospital Stay: Payer: Medicare Other

## 2023-01-24 ENCOUNTER — Encounter: Payer: Self-pay | Admitting: Hematology and Oncology

## 2023-01-24 ENCOUNTER — Ambulatory Visit: Payer: Medicare Other | Admitting: Hematology and Oncology

## 2023-01-24 ENCOUNTER — Other Ambulatory Visit: Payer: Self-pay

## 2023-01-24 ENCOUNTER — Inpatient Hospital Stay (HOSPITAL_BASED_OUTPATIENT_CLINIC_OR_DEPARTMENT_OTHER): Payer: Medicare Other | Admitting: Adult Health

## 2023-01-24 ENCOUNTER — Inpatient Hospital Stay: Payer: Medicare Other | Attending: Hematology and Oncology

## 2023-01-24 ENCOUNTER — Encounter: Payer: Self-pay | Admitting: Adult Health

## 2023-01-24 VITALS — HR 93

## 2023-01-24 VITALS — BP 161/71 | HR 103 | Temp 97.7°F | Resp 18 | Ht 63.0 in | Wt 168.3 lb

## 2023-01-24 DIAGNOSIS — C50411 Malignant neoplasm of upper-outer quadrant of right female breast: Secondary | ICD-10-CM | POA: Diagnosis not present

## 2023-01-24 DIAGNOSIS — Z87891 Personal history of nicotine dependence: Secondary | ICD-10-CM | POA: Diagnosis not present

## 2023-01-24 DIAGNOSIS — Z95828 Presence of other vascular implants and grafts: Secondary | ICD-10-CM

## 2023-01-24 DIAGNOSIS — Z5112 Encounter for antineoplastic immunotherapy: Secondary | ICD-10-CM | POA: Diagnosis not present

## 2023-01-24 DIAGNOSIS — Z170 Estrogen receptor positive status [ER+]: Secondary | ICD-10-CM | POA: Diagnosis not present

## 2023-01-24 DIAGNOSIS — R2689 Other abnormalities of gait and mobility: Secondary | ICD-10-CM

## 2023-01-24 DIAGNOSIS — Z5111 Encounter for antineoplastic chemotherapy: Secondary | ICD-10-CM | POA: Diagnosis present

## 2023-01-24 DIAGNOSIS — M5431 Sciatica, right side: Secondary | ICD-10-CM

## 2023-01-24 LAB — CMP (CANCER CENTER ONLY)
ALT: 17 U/L (ref 0–44)
AST: 16 U/L (ref 15–41)
Albumin: 3.9 g/dL (ref 3.5–5.0)
Alkaline Phosphatase: 82 U/L (ref 38–126)
Anion gap: 6 (ref 5–15)
BUN: 15 mg/dL (ref 8–23)
CO2: 28 mmol/L (ref 22–32)
Calcium: 8.9 mg/dL (ref 8.9–10.3)
Chloride: 104 mmol/L (ref 98–111)
Creatinine: 0.82 mg/dL (ref 0.44–1.00)
GFR, Estimated: >60 mL/min (ref >60)
Glucose, Bld: 80 mg/dL (ref 70–99)
Potassium: 4.1 mmol/L (ref 3.5–5.1)
Sodium: 138 mmol/L (ref 135–145)
Total Bilirubin: 0.4 mg/dL (ref 0.3–1.2)
Total Protein: 6.2 g/dL — ABNORMAL LOW (ref 6.5–8.1)

## 2023-01-24 LAB — CBC WITH DIFFERENTIAL (CANCER CENTER ONLY)
Abs Immature Granulocytes: 0.11 10*3/uL — ABNORMAL HIGH (ref 0.00–0.07)
Basophils Absolute: 0.1 10*3/uL (ref 0.0–0.1)
Basophils Relative: 1 %
Eosinophils Absolute: 0.2 10*3/uL (ref 0.0–0.5)
Eosinophils Relative: 3 %
HCT: 31.1 % — ABNORMAL LOW (ref 36.0–46.0)
Hemoglobin: 10.1 g/dL — ABNORMAL LOW (ref 12.0–15.0)
Immature Granulocytes: 1 %
Lymphocytes Relative: 15 %
Lymphs Abs: 1.2 10*3/uL (ref 0.7–4.0)
MCH: 26.6 pg (ref 26.0–34.0)
MCHC: 32.5 g/dL (ref 30.0–36.0)
MCV: 81.8 fL (ref 80.0–100.0)
Monocytes Absolute: 1.4 10*3/uL — ABNORMAL HIGH (ref 0.1–1.0)
Monocytes Relative: 18 %
Neutro Abs: 4.8 10*3/uL (ref 1.7–7.7)
Neutrophils Relative %: 62 %
Platelet Count: 341 10*3/uL (ref 150–400)
RBC: 3.8 MIL/uL — ABNORMAL LOW (ref 3.87–5.11)
RDW: 17.7 % — ABNORMAL HIGH (ref 11.5–15.5)
WBC Count: 7.7 10*3/uL (ref 4.0–10.5)
nRBC: 0 % (ref 0.0–0.2)

## 2023-01-24 LAB — RESEARCH LABS

## 2023-01-24 MED ORDER — DIPHENHYDRAMINE HCL 50 MG/ML IJ SOLN
50.0000 mg | Freq: Once | INTRAMUSCULAR | Status: AC
  Administered 2023-01-24: 50 mg via INTRAVENOUS
  Filled 2023-01-24: qty 1

## 2023-01-24 MED ORDER — FAMOTIDINE 20 MG IN NS 100 ML IVPB
20.0000 mg | Freq: Once | INTRAVENOUS | Status: AC
  Administered 2023-01-24: 20 mg via INTRAVENOUS
  Filled 2023-01-24: qty 100

## 2023-01-24 MED ORDER — TRASTUZUMAB-DTTB CHEMO 150 MG IV SOLR
2.0000 mg/kg | Freq: Once | INTRAVENOUS | Status: AC
  Administered 2023-01-24: 150 mg via INTRAVENOUS
  Filled 2023-01-24: qty 7.14

## 2023-01-24 MED ORDER — ACETAMINOPHEN 325 MG PO TABS
650.0000 mg | ORAL_TABLET | Freq: Once | ORAL | Status: AC
  Administered 2023-01-24: 650 mg via ORAL
  Filled 2023-01-24: qty 2

## 2023-01-24 MED ORDER — SODIUM CHLORIDE 0.9% FLUSH
10.0000 mL | Freq: Once | INTRAVENOUS | Status: AC
  Administered 2023-01-24: 10 mL

## 2023-01-24 MED ORDER — SODIUM CHLORIDE 0.9 % IV SOLN
10.0000 mg | Freq: Once | INTRAVENOUS | Status: AC
  Administered 2023-01-24: 10 mg via INTRAVENOUS
  Filled 2023-01-24: qty 1

## 2023-01-24 MED ORDER — SODIUM CHLORIDE 0.9 % IV SOLN
80.0000 mg/m2 | Freq: Once | INTRAVENOUS | Status: AC
  Administered 2023-01-24: 144 mg via INTRAVENOUS
  Filled 2023-01-24: qty 24

## 2023-01-24 MED ORDER — HEPARIN SOD (PORK) LOCK FLUSH 100 UNIT/ML IV SOLN
500.0000 [IU] | Freq: Once | INTRAVENOUS | Status: AC | PRN
  Administered 2023-01-24: 500 [IU]

## 2023-01-24 MED ORDER — SODIUM CHLORIDE 0.9 % IV SOLN: Freq: Once | INTRAVENOUS | Status: AC

## 2023-01-24 MED ORDER — SODIUM CHLORIDE 0.9% FLUSH
10.0000 mL | INTRAVENOUS | Status: DC | PRN
  Administered 2023-01-24: 10 mL

## 2023-01-24 NOTE — Patient Instructions (Signed)
[facility]  Discharge Instructions: Thank you for choosing [facility] to provide your oncology and hematology care.   If you have a lab appointment with the Cancer Center, please go directly to the Cancer Center and check in at the registration area.   Wear comfortable clothing and clothing appropriate for easy access to any Portacath or PICC line.   We strive to give you quality time with your provider. You may need to reschedule your appointment if you arrive late (15 or more minutes).  Arriving late affects you and other patients whose appointments are after yours.  Also, if you miss three or more appointments without notifying the office, you may be dismissed from the clinic at the provider's discretion.      For prescription refill requests, have your pharmacy contact our office and allow 72 hours for refills to be completed.    Today you received the following chemotherapy and/or immunotherapy agents: Trastuzumab and Paclitaxel (Taxol)   To help prevent nausea and vomiting after your treatment, we encourage you to take your nausea medication as directed.  BELOW ARE SYMPTOMS THAT SHOULD BE REPORTED IMMEDIATELY: *FEVER GREATER THAN 100.4 F (38 C) OR HIGHER *CHILLS OR SWEATING *NAUSEA AND VOMITING THAT IS NOT CONTROLLED WITH YOUR NAUSEA MEDICATION *UNUSUAL SHORTNESS OF BREATH *UNUSUAL BRUISING OR BLEEDING *URINARY PROBLEMS (pain or burning when urinating, or frequent urination) *BOWEL PROBLEMS (unusual diarrhea, constipation, pain near the anus) TENDERNESS IN MOUTH AND THROAT WITH OR WITHOUT PRESENCE OF ULCERS (sore throat, sores in mouth, or a toothache) UNUSUAL RASH, SWELLING OR PAIN  UNUSUAL VAGINAL DISCHARGE OR ITCHING   Items with * indicate a potential emergency and should be followed up as soon as possible or go to the Emergency Department if any problems should occur.  Please show the CHEMOTHERAPY ALERT CARD or  IMMUNOTHERAPY ALERT CARD at check-in to the Emergency Department and triage nurse.  Should you have questions after your visit or need to cancel or reschedule your appointment, please contact [facility]  Dept: [phone]  and follow the prompts.  Office hours are 8:00 a.m. to 4:30 p.m. Monday - Friday. Please note that voicemails left after 4:00 p.m. may not be returned until the following business day.  We are closed weekends and major holidays. You have access to a nurse at all times for urgent questions. Please call the main number to the clinic Dept: [phone] and follow the prompts.   For any non-urgent questions, you may also contact your provider using MyChart. We now offer e-Visits for anyone 18 and older to request care online for non-urgent symptoms. For details visit [email or web address].   Also download the MyChart app! Go to the app store, search "MyChart", open the app, select , and log in with your MyChart username and password.  Trastuzumab Injection What is this medication? TRASTUZUMAB (tras TOO zoo mab) treats breast cancer and stomach cancer. It works by blocking a protein that causes cancer cells to grow and multiply. This helps to slow or stop the spread of cancer cells. This medicine may be used for other purposes; ask your health care provider or pharmacist if you have questions. COMMON BRAND NAME(S): Herceptin, Herzuma, KANJINTI, Ogivri, Ontruzant, Trazimera What should I tell my care team before I take this medication? They need to know if you have any of these conditions: Heart failure Lung disease An unusual or allergic reaction to trastuzumab, other medications,   foods, dyes, or preservatives Pregnant or trying to get pregnant Breast-feeding How should I use this medication? This medication is injected into a vein. It is given by your care team in a hospital or clinic setting. Talk to your care team about the  use of this medication in children. It is not approved for use in children. Overdosage: If you think you have taken too much of this medicine contact a poison control center or emergency room at once. NOTE: This medicine is only for you. Do not share this medicine with others. What if I miss a dose? Keep appointments for follow-up doses. It is important not to miss your dose. Call your care team if you are unable to keep an appointment. What may interact with this medication? Certain types of chemotherapy, such as daunorubicin, doxorubicin, epirubicin, idarubicin This list may not describe all possible interactions. Give your health care provider a list of all the medicines, herbs, non-prescription drugs, or dietary supplements you use. Also tell them if you smoke, drink alcohol, or use illegal drugs. Some items may interact with your medicine. What should I watch for while using this medication? Your condition will be monitored carefully while you are receiving this medication. This medication may make you feel generally unwell. This is not uncommon, as chemotherapy affects healthy cells as well as cancer cells. Report any side effects. Continue your course of treatment even though you feel ill unless your care team tells you to stop. This medication may increase your risk of getting an infection. Call your care team for advice if you get a fever, chills, sore throat, or other symptoms of a cold or flu. Do not treat yourself. Try to avoid being around people who are sick. Avoid taking medications that contain aspirin, acetaminophen, ibuprofen, naproxen, or ketoprofen unless instructed by your care team. These medications can hide a fever. Talk to your care team if you may be pregnant. Serious birth defects can occur if you take this medication during pregnancy and for 7 months after the last dose. You will need a negative pregnancy test before starting this medication. Contraception is recommended  while taking this medication and for 7 months after the last dose. Your care team can help you find the option that works for you. Do not breastfeed while taking this medication and for 7 months after stopping treatment. What side effects may I notice from receiving this medication? Side effects that you should report to your care team as soon as possible: Allergic reactions or angioedema--skin rash, itching or hives, swelling of the face, eyes, lips, tongue, arms, or legs, trouble swallowing or breathing Dry cough, shortness of breath or trouble breathing Heart failure--shortness of breath, swelling of the ankles, feet, or hands, sudden weight gain, unusual weakness or fatigue Infection--fever, chills, cough, or sore throat Infusion reactions--chest pain, shortness of breath or trouble breathing, feeling faint or lightheaded Side effects that usually do not require medical attention (report to your care team if they continue or are bothersome): Diarrhea Dizziness Headache Nausea Trouble sleeping Vomiting This list may not describe all possible side effects. Call your doctor for medical advice about side effects. You may report side effects to FDA at 1-800-FDA-1088. Where should I keep my medication? This medication is given in a hospital or clinic. It will not be stored at home. NOTE: This sheet is a summary. It may not cover all possible information. If you have questions about this medicine, talk to your doctor, pharmacist, or health care   provider.  2023 Elsevier/Gold Standard (2021-12-31 00:00:00) Paclitaxel Injection What is this medication? PACLITAXEL (PAK li TAX el) treats some types of cancer. It works by slowing down the growth of cancer cells. This medicine may be used for other purposes; ask your health care provider or pharmacist if you have questions. COMMON BRAND NAME(S): Onxol, Taxol What should I tell my care team before I take this medication? They need to know if you  have any of these conditions: Heart disease Liver disease Low white blood cell levels An unusual or allergic reaction to paclitaxel, other medications, foods, dyes, or preservatives If you or your partner are pregnant or trying to get pregnant Breast-feeding How should I use this medication? This medication is injected into a vein. It is given by your care team in a hospital or clinic setting. Talk to your care team about the use of this medication in children. While it may be given to children for selected conditions, precautions do apply. Overdosage: If you think you have taken too much of this medicine contact a poison control center or emergency room at once. NOTE: This medicine is only for you. Do not share this medicine with others. What if I miss a dose? Keep appointments for follow-up doses. It is important not to miss your dose. Call your care team if you are unable to keep an appointment. What may interact with this medication? Do not take this medication with any of the following: Live virus vaccines Other medications may affect the way this medication works. Talk with your care team about all of the medications you take. They may suggest changes to your treatment plan to lower the risk of side effects and to make sure your medications work as intended. This list may not describe all possible interactions. Give your health care provider a list of all the medicines, herbs, non-prescription drugs, or dietary supplements you use. Also tell them if you smoke, drink alcohol, or use illegal drugs. Some items may interact with your medicine. What should I watch for while using this medication? Your condition will be monitored carefully while you are receiving this medication. You may need blood work while taking this medication. This medication may make you feel generally unwell. This is not uncommon as chemotherapy can affect healthy cells as well as cancer cells. Report any side effects.  Continue your course of treatment even though you feel ill unless your care team tells you to stop. This medication can cause serious allergic reactions. To reduce the risk, your care team may give you other medications to take before receiving this one. Be sure to follow the directions from your care team. This medication may increase your risk of getting an infection. Call your care team for advice if you get a fever, chills, sore throat, or other symptoms of a cold or flu. Do not treat yourself. Try to avoid being around people who are sick. This medication may increase your risk to bruise or bleed. Call your care team if you notice any unusual bleeding. Be careful brushing or flossing your teeth or using a toothpick because you may get an infection or bleed more easily. If you have any dental work done, tell your dentist you are receiving this medication. Talk to your care team if you may be pregnant. Serious birth defects can occur if you take this medication during pregnancy. Talk to your care team before breastfeeding. Changes to your treatment plan may be needed. What side effects may I notice   from receiving this medication? Side effects that you should report to your care team as soon as possible: Allergic reactions--skin rash, itching, hives, swelling of the face, lips, tongue, or throat Heart rhythm changes--fast or irregular heartbeat, dizziness, feeling faint or lightheaded, chest pain, trouble breathing Increase in blood pressure Infection--fever, chills, cough, sore throat, wounds that don't heal, pain or trouble when passing urine, general feeling of discomfort or being unwell Low blood pressure--dizziness, feeling faint or lightheaded, blurry vision Low red blood cell level--unusual weakness or fatigue, dizziness, headache, trouble breathing Painful swelling, warmth, or redness of the skin, blisters or sores at the infusion site Pain, tingling, or numbness in the hands or feet Slow  heartbeat--dizziness, feeling faint or lightheaded, confusion, trouble breathing, unusual weakness or fatigue Unusual bruising or bleeding Side effects that usually do not require medical attention (report to your care team if they continue or are bothersome): Diarrhea Hair loss Joint pain Loss of appetite Muscle pain Nausea Vomiting This list may not describe all possible side effects. Call your doctor for medical advice about side effects. You may report side effects to FDA at 1-800-FDA-1088. Where should I keep my medication? This medication is given in a hospital or clinic. It will not be stored at home. NOTE: This sheet is a summary. It may not cover all possible information. If you have questions about this medicine, talk to your doctor, pharmacist, or health care provider.  2023 Elsevier/Gold Standard (2022-01-14 00:00:00)    

## 2023-01-24 NOTE — Research (Signed)
Z6109, ICE COMPRESS: RANDOMIZED TRIAL OF LIMB CRYOCOMPRESSION VERSUS CONTINUOUS COMPRESSION VERSUS LOW CYCLIC COMPRESSION FOR THE PREVENTION  OF TAXANE-INDUCED PERIPHERAL NEUROPATHY  12 Week PROs, assessments, and labs collected at today's visit. Patient is not on study treatment. She was thanked for her continued willingness to have assessments, and we discussed her next assessment at 24 weeks.   Patient was not seen in clinic or given treatment last week due to illness.  Margret Chance Candelaria Pies, RN, BSN, [facility] She  Her  [employer] Clinical Research Nurse [facility] Direct Dial [phone]  Pager [phone] 01/24/2023 11:04 AM

## 2023-01-24 NOTE — Progress Notes (Signed)
[facility] Cancer Follow up:    Medicine, [facility] [address] Vella Raring [place] [address]   DIAGNOSIS:  Cancer Staging  Malignant neoplasm of upper-outer quadrant of right breast in female, estrogen receptor positive (HCC) Staging form: Breast, AJCC 8th Edition - Clinical stage from 09/15/2022: Stage IA (cT1c, cN0, cM0, G2, ER+, PR+, HER2+) - Unsigned Stage prefix: Initial diagnosis Histologic grading system: 3 grade system Laterality: Right Staged by: Pathologist and managing physician Stage used in treatment planning: Yes National guidelines used in treatment planning: Yes Type of national guideline used in treatment planning: NCCN - Pathologic stage from 10/05/2022: Stage IA (pT1c, pN0, cM0, G2, ER+, PR+, HER2+) - Signed by Loa Socks, NP on 11/08/2022 Stage prefix: Initial diagnosis Histologic grading system: 3 grade system   SUMMARY OF ONCOLOGIC HISTORY: Oncology History  Malignant neoplasm of upper-outer quadrant of right breast in female, estrogen receptor positive (HCC)  08/02/2022 Mammogram   In the right breast, possible distortion warrants further evaluation. In the left breast, no findings suspicious for malignancy. Diagnostic mammogram showed persistent subtle distortion central slightly LATERAL aspect of the RIGHT breast warranting tissue diagnosis.     09/01/2022 Pathology Results   Final pathology showed grade 2 invasive mammary carcinoma, prognostic showed ER 90% positive strong staining PR 90% positive strong staining, Ki-67 of 5% and HER2 3+ by [facility]   09/10/2022 Initial Diagnosis   Malignant neoplasm of upper-outer quadrant of right breast in female, estrogen receptor positive (HCC)    Genetic Testing   [facility] CancerNext-Expanded Panel+RNA was Positive. 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  Report date is 10/04/2022.  The CancerNext-Expanded gene panel offered by [facility] and includes sequencing, rearrangement, and RNA analysis for the following 77 genes: AIP, ALK, APC, ATM, AXIN2, BAP1, BARD1, BLM, BMPR1A, BRCA1, BRCA2, BRIP1, CDC73, CDH1, CDK4, CDKN1B, CDKN2A, CHEK2, CTNNA1, DICER1, FANCC, FH, FLCN, GALNT12, KIF1B, LZTR1, MAX, MEN1, MET, MLH1, MSH2, MSH3, MSH6, MUTYH, NBN, NF1, NF2, NTHL1, PALB2, PHOX2B, PMS2, POT1, PRKAR1A, PTCH1, PTEN, RAD51C, RAD51D, RB1, RECQL, RET, SDHA, SDHAF2, SDHB, SDHC, SDHD, SMAD4, SMARCA4, SMARCB1, SMARCE1, STK11, SUFU, TMEM127, TP53, TSC1, TSC2, VHL and XRCC2 (sequencing and deletion/duplication); EGFR, EGLN1, HOXB13, KIT, MITF, PDGFRA, POLD1, and POLE (sequencing only); EPCAM and GREM1 (deletion/duplication only).    10/05/2022 Surgery   Right breast lumpectomy with Dr. 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  Grade 2, 2 sentinel lymph nodes negative for metastasis.  Margins negative.  T1c, N0.   10/05/2022 Cancer Staging   Staging form: Breast, AJCC 8th Edition - Pathologic stage from 10/05/2022: Stage IA (pT1c, pN0, cM0, G2, ER+, PR+, HER2+) - Signed by Loa Socks, NP on 11/08/2022 Stage prefix: Initial diagnosis Histologic grading system: 3 grade system   11/08/2022 -  Chemotherapy   Patient is on Treatment Plan : BREAST Paclitaxel + Trastuzumab q7d / Trastuzumab q21d       CURRENT THERAPY: Taxol/Herceptin  INTERVAL HISTORY: Judy Morrison 72 y.o. female returns for follow-up and evaluation prior to receiving her eighth week of Taxol and Herceptin.  Her most recent echocardiogram occurred on October 18, 2022 demonstrating a left ventricular ejection fraction of 60 to 65%.  Normal global longitudinal strain.  Her next echocardiogram is scheduled on Feb 02, 2023.  She is feeling moderately well today.  She missed her treatment last week due to having  a cold.  That has resolved, she has a minor  residual cough, that isn't particularly bothersome, otherwise she is feeling ready to proceed with treatment.  Daughter notes that prior to beginning chemotherapy she had some baseline unsteadiness in her gait.  She feels like this might slightly be worse and also that she seems more foggy headed.  She denies any lightheadedness, room spinning, numbness or tingling in her fingertips or toes, any new focal weakness, taste change, vision change or any other concerns.  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   Malignant neoplasm of upper-outer quadrant of right breast in female, estrogen receptor positive (HCC) 09/10/2022   Rash and other nonspecific skin eruption 09/15/2020   Allergic contact dermatitis 09/15/2020   Pruritus 08/14/2020   Dizziness 05/10/2018   Atypical chest pain 05/10/2018   Primary osteoarthritis of left knee 01/06/2018   S/P knee replacement 08/12/2017   Incomplete tear of right rotator cuff 05/19/2017   Osteopenia 05/19/2017   Urge incontinence of urine 05/19/2017   Primary open angle glaucoma (POAG) 12/30/2016   Primary osteoarthritis of both knees 12/30/2016   Recurrent oral herpes simplex infection 12/30/2016   Tubular adenoma of colon 12/30/2016   Hypothyroidism 06/25/2016   Right foot pain 12/19/2014   Anxiety 05/21/2011   Seasonal allergies 05/21/2011    is allergic to penicillin g, cephalexin, codeine, erythromycin, and penicillins.  MEDICAL HISTORY: Past Medical History:  Diagnosis Date   Anemia    Anxiety    Arthritis    Depressed    Dermatitis    rare form, on prednisone   GERD (gastroesophageal reflux disease)    controlled with Omeprazole   Glaucoma    Torn rotator cuff    RIGHT    Urinary tract infection    dx 05-26-17 took 1 week cipro BID completed 06-02-17.    SURGICAL HISTORY: Past Surgical History:  Procedure  Laterality Date   ADENOIDECTOMY     APPENDECTOMY     arthroscopic right knee      BREAST BIOPSY Right 09/01/2022   MM RT BREAST BX W LOC DEV 1ST LESION IMAGE BX SPEC STEREO GUIDE 09/01/2022 [facility]   BREAST BIOPSY  10/01/2022   MM RT RADIOACTIVE SEED LOC MAMMO GUIDE 10/01/2022 [facility]   BREAST LUMPECTOMY WITH RADIOACTIVE SEED AND SENTINEL LYMPH NODE BIOPSY Right 10/05/2022   Procedure: RIGHT BREAST LUMPECTOMY WITH RADIOACTIVE SEED AND AXILLARY SENTINEL LYMPH NODE BIOPSY;  Surgeon: [name], MD;  Location: [facility];  Service: General;  Laterality: Right;   CHOLECYSTECTOMY     COLONOSCOPY  06/06/2017   Pyrtle   Pelvic sling     x2   POLYPECTOMY     PORT A CATH REVISION N/A 11/22/2022   Procedure: PORT A CATH REVISION;  Surgeon: [name], MD;  Location: [facility];  Service: General;  Laterality: N/A;   PORTACATH PLACEMENT Left 10/05/2022   Procedure: INSERTION PORT-A-CATH;  Surgeon: [name], MD;  Location: [facility];  Service: General;  Laterality: Left;   ROTATOR CUFF REPAIR Right    TONSILLECTOMY     TOTAL KNEE ARTHROPLASTY Right 08/12/2017   Procedure: RIGHT TOTAL KNEE ARTHROPLASTY;  Surgeon: Eugenia Mcalpine, MD;  Location: [facility];  Service: Orthopedics;  Laterality: Right;   TOTAL KNEE ARTHROPLASTY Left 01/06/2018   Procedure: LEFT TOTAL KNEE ARTHROPLASTY;  Surgeon: Eugenia Mcalpine,  MD;  Location: [facility];  Service: Orthopedics;  Laterality: Left;   UPPER GASTROINTESTINAL ENDOSCOPY     WISDOM TOOTH EXTRACTION      SOCIAL HISTORY: Social History   Socioeconomic History   Marital status: Single    Spouse name: Not on file   Number of children: Not on file   Years of education: Not on file   Highest education level: Not on file  Occupational History   Not on file  Tobacco Use   Smoking status: Former    Types: Cigarettes    Quit date: 67    Years since quitting: 38.3   Smokeless tobacco: Never   Vaping Use   Vaping Use: Never used  Substance and Sexual Activity   Alcohol use: Yes    Alcohol/week: 2.0 standard drinks of alcohol    Types: 2 Cans of beer per week   Drug use: No   Sexual activity: Not on file  Other Topics Concern   Not on file  Social History Narrative   Not on file   Social Determinants of Health   Financial Resource Strain: Not on file  Food Insecurity: No Food Insecurity (09/15/2022)   Hunger Vital Sign    Worried About Running Out of Food in the Last Year: Never true    Ran Out of Food in the Last Year: Never true  Transportation Needs: No Transportation Needs (09/15/2022)   PRAPARE - Administrator, Civil Service (Medical): No    Lack of Transportation (Non-Medical): No  Physical Activity: Not on file  Stress: Not on file  Social Connections: Not on file  Intimate Partner Violence: Not on file    FAMILY HISTORY: Family History  Problem Relation Age of Onset   Bowel Disease Mother        ishemic bowel   Lung cancer Mother        smoked   Cervical cancer Paternal Aunt    Cancer Maternal Grandfather        unknown type   Lung cancer Paternal Grandfather        smoked   Colon cancer Neg Hx    Breast cancer Neg Hx    Esophageal cancer Neg Hx    Pancreatic cancer Neg Hx    Prostate cancer Neg Hx    Rectal cancer Neg Hx    Stomach cancer Neg Hx    Allergic rhinitis Neg Hx    Asthma Neg Hx    Eczema Neg Hx    Urticaria Neg Hx    Colon polyps Neg Hx     Review of Systems  Constitutional:  Negative for appetite change, chills, fatigue, fever and unexpected weight change.  HENT:   Negative for hearing loss, lump/mass and trouble swallowing.   Eyes:  Negative for eye problems and icterus.  Respiratory:  Positive for cough. Negative for chest tightness and shortness of breath.   Cardiovascular:  Negative for chest pain, leg swelling and palpitations.  Gastrointestinal:  Negative for abdominal distention, abdominal pain,  constipation, diarrhea, nausea and vomiting.  Endocrine: Negative for hot flashes.  Genitourinary:  Negative for difficulty urinating.   Musculoskeletal:  Positive for gait problem. Negative for arthralgias.  Skin:  Negative for itching and rash.  Neurological:  Positive for gait problem. Negative for dizziness, extremity weakness, headaches and numbness.  Hematological:  Negative for adenopathy. Does not bruise/bleed easily.  Psychiatric/Behavioral:  Negative for depression. The patient is not nervous/anxious.  PHYSICAL EXAMINATION   Onc Performance Status - 01/24/23 0851       ECOG Perf Status   ECOG Perf Status Fully active, able to carry on all pre-disease performance without restriction      KPS SCALE   KPS % SCORE Able to carry on normal activity, minor s/s of disease             Vitals:   01/24/23 0849  BP: (!) 161/71  Pulse: (!) 103  Resp: 18  Temp: 97.7 F (36.5 C)  SpO2: 100%    Physical Exam Constitutional:      General: She is not in acute distress.    Appearance: Normal appearance. She is not toxic-appearing.  HENT:     Head: Normocephalic and atraumatic.     Mouth/Throat:     Mouth: Mucous membranes are moist.     Pharynx: Oropharynx is clear. No oropharyngeal exudate or posterior oropharyngeal erythema.  Eyes:     General: No scleral icterus. Cardiovascular:     Rate and Rhythm: Normal rate and regular rhythm.     Pulses: Normal pulses.     Heart sounds: Normal heart sounds.  Pulmonary:     Effort: Pulmonary effort is normal.     Breath sounds: Normal breath sounds.  Abdominal:     General: Abdomen is flat. Bowel sounds are normal. There is no distension.     Palpations: Abdomen is soft.     Tenderness: There is no abdominal tenderness.  Musculoskeletal:        General: No swelling.     Cervical back: Neck supple.  Lymphadenopathy:     Cervical: No cervical adenopathy.  Skin:    General: Skin is warm and dry.     Findings: No  rash.  Neurological:     General: No focal deficit present.     Mental Status: She is alert and oriented to person, place, and time.     Cranial Nerves: No cranial nerve deficit.     Motor: No weakness.     Coordination: Coordination normal.  Psychiatric:        Mood and Affect: Mood normal.        Behavior: Behavior normal.     LABORATORY DATA:  CBC    Component Value Date/Time   WBC 7.7 01/24/2023 0824   WBC 10.1 10/01/2021 1314   RBC 3.80 (L) 01/24/2023 0824   HGB 10.1 (L) 01/24/2023 0824   HGB 13.7 09/15/2020 1209   HCT 31.1 (L) 01/24/2023 0824   HCT 41.3 09/15/2020 1209   PLT 341 01/24/2023 0824   PLT 349 09/15/2020 1209   MCV 81.8 01/24/2023 0824   MCV 86 09/15/2020 1209   MCH 26.6 01/24/2023 0824   MCHC 32.5 01/24/2023 0824   RDW 17.7 (H) 01/24/2023 0824   RDW 12.3 09/15/2020 1209   LYMPHSABS 1.2 01/24/2023 0824   LYMPHSABS 1.2 09/15/2020 1209   MONOABS 1.4 (H) 01/24/2023 0824   EOSABS 0.2 01/24/2023 0824   EOSABS 0.2 09/15/2020 1209   BASOSABS 0.1 01/24/2023 0824   BASOSABS 0.1 09/15/2020 1209    CMP     Component Value Date/Time   NA 138 01/24/2023 0824   NA 135 09/15/2020 1209   K 4.1 01/24/2023 0824   CL 104 01/24/2023 0824   CO2 28 01/24/2023 0824   GLUCOSE 80 01/24/2023 0824   BUN 15 01/24/2023 0824   BUN 14 09/15/2020 1209   CREATININE 0.82 01/24/2023 0824   CALCIUM 8.9  01/24/2023 0824   PROT 6.2 (L) 01/24/2023 0824   PROT 6.8 09/15/2020 1209   ALBUMIN 3.9 01/24/2023 0824   ALBUMIN 4.6 09/15/2020 1209   AST 16 01/24/2023 0824   ALT 17 01/24/2023 0824   ALKPHOS 82 01/24/2023 0824   BILITOT 0.4 01/24/2023 0824   GFRNONAA >60 01/24/2023 0824   GFRAA 74 09/15/2020 1209        ASSESSMENT and THERAPY PLAN:   Malignant neoplasm of upper-outer quadrant of right breast in female, estrogen receptor positive (HCC) Judy Morrison is a 72 year old woman with history of stage Ia triple positive breast cancer diagnosed in January 2024 status  postlumpectomy and here today to begin adjuvant chemotherapy and immunotherapy with paclitaxel and trastuzumab.  Treatment Plan:  Breast conserving surgery with sentinel node biopsy Adjuvant chemotherapy with Taxol/Herceptin followed by Herceptin maintenance Adjuvant radiation therapy Adjuvant antiestrogen therapy __________________________________________  Judy Morrison will proceed with week 8 of Taxol and Herceptin.  Her labs thus far are stable.  She is okay to treat using the February echocardiogram as her next echo is scheduled on May 22. Gait instability and foggy headedness: We will obtain a brain MRI with and without contrast to fully evaluate. Hip pain and sciatica: We will obtain plain films of the lumbar spine and right hip and pelvis to evaluate.  We will see her back in 1 week for labs, f/u, and her next treatment.      All questions were answered. The patient knows to call the clinic with any problems, questions or concerns. We can certainly see the patient much sooner if necessary.  Total encounter time:30 minutes*in face-to-face visit time, chart review, lab review, care coordination, order entry, and documentation of the encounter time.    Lillard Anes, NP 01/24/23 9:28 AM Medical Oncology and Hematology [facility] [address] [place], [place] [address] Tel. [phone]    Fax. [phone]  *Total Encounter Time as defined by the Centers for Medicare and Medicaid Services includes, in addition to the face-to-face time of a patient visit (documented in the note above) non-face-to-face time: obtaining and reviewing outside history, ordering and reviewing medications, tests or procedures, care coordination (communications with other health care professionals or caregivers) and documentation in the medical record.

## 2023-01-24 NOTE — Assessment & Plan Note (Addendum)
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  Treatment Plan:  Breast conserving surgery with sentinel node biopsy Adjuvant chemotherapy with Taxol/Herceptin followed by Herceptin maintenance Adjuvant radiation therapy Adjuvant antiestrogen therapy __________________________________________  Harrie Jeans will proceed with week 8 of Taxol and Herceptin.  Her labs thus far are stable.  She is okay to treat using the February echocardiogram as her next echo is scheduled on May 22. Gait instability and foggy headedness: We will obtain a brain MRI with and without contrast to fully evaluate. Hip pain and sciatica: We will obtain plain films of the lumbar spine and right hip and pelvis to evaluate.  We will see her back in 1 week for labs, f/u, and her next treatment.

## 2023-01-28 ENCOUNTER — Other Ambulatory Visit: Payer: Self-pay | Admitting: *Deleted

## 2023-01-28 DIAGNOSIS — Z170 Estrogen receptor positive status [ER+]: Secondary | ICD-10-CM

## 2023-01-29 ENCOUNTER — Ambulatory Visit (HOSPITAL_COMMUNITY)
Admission: RE | Admit: 2023-01-29 | Discharge: 2023-01-29 | Disposition: A | Discharge status: Home / Self Care | Payer: Medicare Other | Source: Ambulatory Visit | Attending: Adult Health | Admitting: Adult Health

## 2023-01-29 ENCOUNTER — Ambulatory Visit (HOSPITAL_COMMUNITY): Admission: RE | Admit: 2023-01-29 | Payer: Medicare Other | Source: Ambulatory Visit

## 2023-01-29 ENCOUNTER — Encounter (HOSPITAL_COMMUNITY): Payer: Self-pay

## 2023-01-29 DIAGNOSIS — C50411 Malignant neoplasm of upper-outer quadrant of right female breast: Secondary | ICD-10-CM | POA: Diagnosis present

## 2023-01-29 DIAGNOSIS — Z170 Estrogen receptor positive status [ER+]: Secondary | ICD-10-CM | POA: Insufficient documentation

## 2023-01-29 DIAGNOSIS — R2689 Other abnormalities of gait and mobility: Secondary | ICD-10-CM | POA: Diagnosis present

## 2023-01-29 MED ORDER — GADOBUTROL 1 MMOL/ML IV SOLN
7.5000 mL | Freq: Once | INTRAVENOUS | Status: AC | PRN
  Administered 2023-01-29: 7.5 mL via INTRAVENOUS

## 2023-01-31 ENCOUNTER — Inpatient Hospital Stay (HOSPITAL_BASED_OUTPATIENT_CLINIC_OR_DEPARTMENT_OTHER): Payer: Medicare Other | Admitting: Hematology and Oncology

## 2023-01-31 ENCOUNTER — Inpatient Hospital Stay: Payer: Medicare Other

## 2023-01-31 ENCOUNTER — Encounter: Payer: Self-pay | Admitting: *Deleted

## 2023-01-31 VITALS — BP 136/79 | HR 92 | Resp 18

## 2023-01-31 DIAGNOSIS — Z95828 Presence of other vascular implants and grafts: Secondary | ICD-10-CM

## 2023-01-31 DIAGNOSIS — Z5111 Encounter for antineoplastic chemotherapy: Secondary | ICD-10-CM | POA: Diagnosis not present

## 2023-01-31 DIAGNOSIS — Z170 Estrogen receptor positive status [ER+]: Secondary | ICD-10-CM

## 2023-01-31 DIAGNOSIS — C50411 Malignant neoplasm of upper-outer quadrant of right female breast: Secondary | ICD-10-CM | POA: Diagnosis not present

## 2023-01-31 LAB — CMP (CANCER CENTER ONLY)
ALT: 21 U/L (ref 0–44)
AST: 19 U/L (ref 15–41)
Albumin: 3.9 g/dL (ref 3.5–5.0)
Alkaline Phosphatase: 87 U/L (ref 38–126)
Anion gap: 7 (ref 5–15)
BUN: 10 mg/dL (ref 8–23)
CO2: 26 mmol/L (ref 22–32)
Calcium: 8.9 mg/dL (ref 8.9–10.3)
Chloride: 103 mmol/L (ref 98–111)
Creatinine: 0.8 mg/dL (ref 0.44–1.00)
GFR, Estimated: >60 mL/min (ref >60)
Glucose, Bld: 116 mg/dL — ABNORMAL HIGH (ref 70–99)
Potassium: 3.8 mmol/L (ref 3.5–5.1)
Sodium: 136 mmol/L (ref 135–145)
Total Bilirubin: 0.4 mg/dL (ref 0.3–1.2)
Total Protein: 6.2 g/dL — ABNORMAL LOW (ref 6.5–8.1)

## 2023-01-31 LAB — CBC WITH DIFFERENTIAL (CANCER CENTER ONLY)
Abs Immature Granulocytes: 0.06 10*3/uL (ref 0.00–0.07)
Basophils Absolute: 0.1 10*3/uL (ref 0.0–0.1)
Basophils Relative: 1 %
Eosinophils Absolute: 0.3 10*3/uL (ref 0.0–0.5)
Eosinophils Relative: 5 %
HCT: 30.2 % — ABNORMAL LOW (ref 36.0–46.0)
Hemoglobin: 9.8 g/dL — ABNORMAL LOW (ref 12.0–15.0)
Immature Granulocytes: 1 %
Lymphocytes Relative: 14 %
Lymphs Abs: 0.7 10*3/uL (ref 0.7–4.0)
MCH: 26.8 pg (ref 26.0–34.0)
MCHC: 32.5 g/dL (ref 30.0–36.0)
MCV: 82.7 fL (ref 80.0–100.0)
Monocytes Absolute: 0.5 10*3/uL (ref 0.1–1.0)
Monocytes Relative: 10 %
Neutro Abs: 3.5 10*3/uL (ref 1.7–7.7)
Neutrophils Relative %: 69 %
Platelet Count: 333 10*3/uL (ref 150–400)
RBC: 3.65 MIL/uL — ABNORMAL LOW (ref 3.87–5.11)
RDW: 17.7 % — ABNORMAL HIGH (ref 11.5–15.5)
WBC Count: 5.1 10*3/uL (ref 4.0–10.5)
nRBC: 0 % (ref 0.0–0.2)

## 2023-01-31 MED ORDER — TRASTUZUMAB-DTTB CHEMO 150 MG IV SOLR
2.0000 mg/kg | Freq: Once | INTRAVENOUS | Status: AC
  Administered 2023-01-31: 150 mg via INTRAVENOUS
  Filled 2023-01-31: qty 7.14

## 2023-01-31 MED ORDER — SODIUM CHLORIDE 0.9% FLUSH
10.0000 mL | Freq: Once | INTRAVENOUS | Status: AC
  Administered 2023-01-31: 10 mL

## 2023-01-31 MED ORDER — FAMOTIDINE 20 MG IN NS 100 ML IVPB
20.0000 mg | Freq: Once | INTRAVENOUS | Status: AC
  Administered 2023-01-31: 20 mg via INTRAVENOUS
  Filled 2023-01-31: qty 100

## 2023-01-31 MED ORDER — SODIUM CHLORIDE 0.9 % IV SOLN: Freq: Once | INTRAVENOUS | Status: AC

## 2023-01-31 MED ORDER — SODIUM CHLORIDE 0.9% FLUSH
10.0000 mL | INTRAVENOUS | Status: DC | PRN
  Administered 2023-01-31: 10 mL

## 2023-01-31 MED ORDER — ACETAMINOPHEN 325 MG PO TABS
650.0000 mg | ORAL_TABLET | Freq: Once | ORAL | Status: AC
  Administered 2023-01-31: 650 mg via ORAL
  Filled 2023-01-31: qty 2

## 2023-01-31 MED ORDER — SODIUM CHLORIDE 0.9 % IV SOLN
80.0000 mg/m2 | Freq: Once | INTRAVENOUS | Status: AC
  Administered 2023-01-31: 144 mg via INTRAVENOUS
  Filled 2023-01-31: qty 24

## 2023-01-31 MED ORDER — SODIUM CHLORIDE 0.9 % IV SOLN
10.0000 mg | Freq: Once | INTRAVENOUS | Status: AC
  Administered 2023-01-31: 10 mg via INTRAVENOUS
  Filled 2023-01-31: qty 10

## 2023-01-31 MED ORDER — HEPARIN SOD (PORK) LOCK FLUSH 100 UNIT/ML IV SOLN
500.0000 [IU] | Freq: Once | INTRAVENOUS | Status: AC | PRN
  Administered 2023-01-31: 500 [IU]

## 2023-01-31 MED ORDER — DIPHENHYDRAMINE HCL 50 MG/ML IJ SOLN
50.0000 mg | Freq: Once | INTRAMUSCULAR | Status: AC
  Administered 2023-01-31: 50 mg via INTRAVENOUS
  Filled 2023-01-31: qty 1

## 2023-01-31 NOTE — Progress Notes (Signed)
[facility] Cancer Follow up:    Medicine, [facility] [address] Vella R[place] [address]   DIAGNOSIS:  Cancer Staging  Malignant neoplasm of upper-outer quadrant of right breast in female, estrogen receptor positive (HCC) Staging form: Breast, AJCC 8th Edition - Clinical stage from 09/15/2022: Stage IA (cT1c, cN0, cM0, G2, ER+, PR+, HER2+) - Unsigned Stage prefix: Initial diagnosis Histologic grading system: 3 grade system Laterality: Right Staged by: Pathologist and managing physician Stage used in treatment planning: Yes National guidelines used in treatment planning: Yes Type of national guideline used in treatment planning: NCCN - Pathologic stage from 10/05/2022: Stage IA (pT1c, pN0, cM0, G2, ER+, PR+, HER2+) - Signed by Loa Socks, NP on 11/08/2022 Stage prefix: Initial diagnosis Histologic grading system: 3 grade system   SUMMARY OF ONCOLOGIC HISTORY: Oncology History  Malignant neoplasm of upper-outer quadrant of right breast in female, estrogen receptor positive (HCC)  08/02/2022 Mammogram   In the right breast, possible distortion warrants further evaluation. In the left breast, no findings suspicious for malignancy. Diagnostic mammogram showed persistent subtle distortion central slightly LATERAL aspect of the RIGHT breast warranting tissue diagnosis.     09/01/2022 Pathology Results   Final pathology showed grade 2 invasive mammary carcinoma, prognostic showed ER 90% positive strong staining PR 90% positive strong staining, Ki-67 of 5% and HER2 3+ by [facility]   09/10/2022 Initial Diagnosis   Malignant neoplasm of upper-outer quadrant of right breast in female, estrogen receptor positive (HCC)    Genetic Testing   [facility] CancerNext-Expanded Panel+RNA was Positive. 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  Report date is 10/04/2022.  The CancerNext-Expanded gene panel offered by [facility] and includes sequencing, rearrangement, and RNA analysis for the following 77 genes: AIP, ALK, APC, ATM, AXIN2, BAP1, BARD1, BLM, BMPR1A, BRCA1, BRCA2, BRIP1, CDC73, CDH1, CDK4, CDKN1B, CDKN2A, CHEK2, CTNNA1, DICER1, FANCC, FH, FLCN, GALNT12, KIF1B, LZTR1, MAX, MEN1, MET, MLH1, MSH2, MSH3, MSH6, MUTYH, NBN, NF1, NF2, NTHL1, PALB2, PHOX2B, PMS2, POT1, PRKAR1A, PTCH1, PTEN, RAD51C, RAD51D, RB1, RECQL, RET, SDHA, SDHAF2, SDHB, SDHC, SDHD, SMAD4, SMARCA4, SMARCB1, SMARCE1, STK11, SUFU, TMEM127, TP53, TSC1, TSC2, VHL and XRCC2 (sequencing and deletion/duplication); EGFR, EGLN1, HOXB13, KIT, MITF, PDGFRA, POLD1, and POLE (sequencing only); EPCAM and GREM1 (deletion/duplication only).    10/05/2022 Surgery   Right breast lumpectomy with Dr. 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  Grade 2, 2 sentinel lymph nodes negative for metastasis.  Margins negative.  T1c, N0.   10/05/2022 Cancer Staging   Staging form: Breast, AJCC 8th Edition - Pathologic stage from 10/05/2022: Stage IA (pT1c, pN0, cM0, G2, ER+, PR+, HER2+) - Signed by Loa Socks, NP on 11/08/2022 Stage prefix: Initial diagnosis Histologic grading system: 3 grade system   11/08/2022 -  Chemotherapy   Patient is on Treatment Plan : BREAST Paclitaxel + Trastuzumab q7d / Trastuzumab q21d       CURRENT THERAPY: Taxol/Herceptin  INTERVAL HISTORY:  Eulis Foster Tsosie 72 y.o. female returns for follow-up and evaluation prior to receiving her eighth week of Taxol and Herceptin.  Her most recent echocardiogram occurred on October 18, 2022 demonstrating a left ventricular ejection fraction of 60 to 65%.  Normal global longitudinal strain.  Her next echocardiogram is scheduled on Feb 02, 2023.  She is feeling moderately well today.  She missed her treatment last week due to  having a cold.  That has resolved, she has a minor  residual cough, that isn't particularly bothersome, otherwise she is feeling ready to proceed with treatment.  Daughter notes that prior to beginning chemotherapy she had some baseline unsteadiness in her gait.  She feels like this might slightly be worse and also that she seems more foggy headed.  She denies any lightheadedness, room spinning, numbness or tingling in her fingertips or toes, any new focal weakness, taste change, vision change or any other concerns.  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   Malignant neoplasm of upper-outer quadrant of right breast in female, estrogen receptor positive (HCC) 09/10/2022   Rash and other nonspecific skin eruption 09/15/2020   Allergic contact dermatitis 09/15/2020   Pruritus 08/14/2020   Dizziness 05/10/2018   Atypical chest pain 05/10/2018   Primary osteoarthritis of left knee 01/06/2018   S/P knee replacement 08/12/2017   Incomplete tear of right rotator cuff 05/19/2017   Osteopenia 05/19/2017   Urge incontinence of urine 05/19/2017   Primary open angle glaucoma (POAG) 12/30/2016   Primary osteoarthritis of both knees 12/30/2016   Recurrent oral herpes simplex infection 12/30/2016   Tubular adenoma of colon 12/30/2016   Hypothyroidism 06/25/2016   Right foot pain 12/19/2014   Anxiety 05/21/2011   Seasonal allergies 05/21/2011    is allergic to penicillin g, cephalexin, codeine, erythromycin, and penicillins.  MEDICAL HISTORY: Past Medical History:  Diagnosis Date   Anemia    Anxiety    Arthritis    Depressed    Dermatitis    rare form, on prednisone   GERD (gastroesophageal reflux disease)    controlled with Omeprazole   Glaucoma    Torn rotator cuff    RIGHT    Urinary tract infection    dx 05-26-17 took 1 week cipro BID completed 06-02-17.    SURGICAL HISTORY: Past Surgical History:  Procedure  Laterality Date   ADENOIDECTOMY     APPENDECTOMY     arthroscopic right knee      BREAST BIOPSY Right 09/01/2022   MM RT BREAST BX W LOC DEV 1ST LESION IMAGE BX SPEC STEREO GUIDE 09/01/2022 [facility]   BREAST BIOPSY  10/01/2022   MM RT RADIOACTIVE SEED LOC MAMMO GUIDE 10/01/2022 [facility]   BREAST LUMPECTOMY WITH RADIOACTIVE SEED AND SENTINEL LYMPH NODE BIOPSY Right 10/05/2022   Procedure: RIGHT BREAST LUMPECTOMY WITH RADIOACTIVE SEED AND AXILLARY SENTINEL LYMPH NODE BIOPSY;  Surgeon: [name], MD;  Location: [facility];  Service: General;  Laterality: Right;   CHOLECYSTECTOMY     COLONOSCOPY  06/06/2017   Pyrtle   Pelvic sling     x2   POLYPECTOMY     PORT A CATH REVISION N/A 11/22/2022   Procedure: PORT A CATH REVISION;  Surgeon: [name], MD;  Location: [facility];  Service: General;  Laterality: N/A;   PORTACATH PLACEMENT Left 10/05/2022   Procedure: INSERTION PORT-A-CATH;  Surgeon: [name], MD;  Location: [facility];  Service: General;  Laterality: Left;   ROTATOR CUFF REPAIR Right    TONSILLECTOMY     TOTAL KNEE ARTHROPLASTY Right 08/12/2017   Procedure: RIGHT TOTAL KNEE ARTHROPLASTY;  Surgeon: Eugenia Mcalpine, MD;  Location: [facility];  Service: Orthopedics;  Laterality: Right;   TOTAL KNEE ARTHROPLASTY Left 01/06/2018   Procedure: LEFT TOTAL KNEE ARTHROPLASTY;  Surgeon: Thomasena Edis,  Molly Maduro, MD;  Location: [facility];  Service: Orthopedics;  Laterality: Left;   UPPER GASTROINTESTINAL ENDOSCOPY     WISDOM TOOTH EXTRACTION      SOCIAL HISTORY: Social History   Socioeconomic History   Marital status: Single    Spouse name: Not on file   Number of children: Not on file   Years of education: Not on file   Highest education level: Not on file  Occupational History   Not on file  Tobacco Use   Smoking status: Former    Types: Cigarettes    Quit date: 35    Years since quitting: 38.4   Smokeless tobacco: Never   Vaping Use   Vaping Use: Never used  Substance and Sexual Activity   Alcohol use: Yes    Alcohol/week: 2.0 standard drinks of alcohol    Types: 2 Cans of beer per week   Drug use: No   Sexual activity: Not on file  Other Topics Concern   Not on file  Social History Narrative   Not on file   Social Determinants of Health   Financial Resource Strain: Not on file  Food Insecurity: No Food Insecurity (09/15/2022)   Hunger Vital Sign    Worried About Running Out of Food in the Last Year: Never true    Ran Out of Food in the Last Year: Never true  Transportation Needs: No Transportation Needs (09/15/2022)   PRAPARE - Administrator, Civil Service (Medical): No    Lack of Transportation (Non-Medical): No  Physical Activity: Not on file  Stress: Not on file  Social Connections: Not on file  Intimate Partner Violence: Not on file    FAMILY HISTORY: Family History  Problem Relation Age of Onset   Bowel Disease Mother        ishemic bowel   Lung cancer Mother        smoked   Cervical cancer Paternal Aunt    Cancer Maternal Grandfather        unknown type   Lung cancer Paternal Grandfather        smoked   Colon cancer Neg Hx    Breast cancer Neg Hx    Esophageal cancer Neg Hx    Pancreatic cancer Neg Hx    Prostate cancer Neg Hx    Rectal cancer Neg Hx    Stomach cancer Neg Hx    Allergic rhinitis Neg Hx    Asthma Neg Hx    Eczema Neg Hx    Urticaria Neg Hx    Colon polyps Neg Hx     Review of Systems  Constitutional:  Negative for appetite change, chills, fatigue, fever and unexpected weight change.  HENT:   Negative for hearing loss, lump/mass and trouble swallowing.   Eyes:  Negative for eye problems and icterus.  Respiratory:  Positive for cough. Negative for chest tightness and shortness of breath.   Cardiovascular:  Negative for chest pain, leg swelling and palpitations.  Gastrointestinal:  Negative for abdominal distention, abdominal pain,  constipation, diarrhea, nausea and vomiting.  Endocrine: Negative for hot flashes.  Genitourinary:  Negative for difficulty urinating.   Musculoskeletal:  Positive for gait problem. Negative for arthralgias.  Skin:  Negative for itching and rash.  Neurological:  Positive for gait problem. Negative for dizziness, extremity weakness, headaches and numbness.  Hematological:  Negative for adenopathy. Does not bruise/bleed easily.  Psychiatric/Behavioral:  Negative for depression. The patient is not nervous/anxious.  PHYSICAL EXAMINATION     Vitals:   01/31/23 0951  BP: 137/72  Pulse: 99  Resp: 15  Temp: 98.3 F (36.8 C)  SpO2: 95%    Physical Exam Constitutional:      General: She is not in acute distress.    Appearance: Normal appearance. She is not toxic-appearing.  HENT:     Head: Normocephalic and atraumatic.     Mouth/Throat:     Mouth: Mucous membranes are moist.     Pharynx: Oropharynx is clear. No oropharyngeal exudate or posterior oropharyngeal erythema.  Eyes:     General: No scleral icterus. Cardiovascular:     Rate and Rhythm: Normal rate and regular rhythm.     Pulses: Normal pulses.     Heart sounds: Normal heart sounds.  Pulmonary:     Effort: Pulmonary effort is normal.     Breath sounds: Normal breath sounds.  Abdominal:     General: Abdomen is flat. Bowel sounds are normal. There is no distension.     Palpations: Abdomen is soft.     Tenderness: There is no abdominal tenderness.  Musculoskeletal:        General: No swelling.     Cervical back: Neck supple.  Lymphadenopathy:     Cervical: No cervical adenopathy.  Skin:    General: Skin is warm and dry.     Findings: No rash.  Neurological:     General: No focal deficit present.     Mental Status: She is alert and oriented to person, place, and time.     Cranial Nerves: No cranial nerve deficit.     Motor: No weakness.     Coordination: Coordination normal.  Psychiatric:        Mood and  Affect: Mood normal.        Behavior: Behavior normal.     LABORATORY DATA:  CBC    Component Value Date/Time   WBC 5.1 01/31/2023 0907   WBC 10.1 10/01/2021 1314   RBC 3.65 (L) 01/31/2023 0907   HGB 9.8 (L) 01/31/2023 0907   HGB 13.7 09/15/2020 1209   HCT 30.2 (L) 01/31/2023 0907   HCT 41.3 09/15/2020 1209   PLT 333 01/31/2023 0907   PLT 349 09/15/2020 1209   MCV 82.7 01/31/2023 0907   MCV 86 09/15/2020 1209   MCH 26.8 01/31/2023 0907   MCHC 32.5 01/31/2023 0907   RDW 17.7 (H) 01/31/2023 0907   RDW 12.3 09/15/2020 1209   LYMPHSABS 0.7 01/31/2023 0907   LYMPHSABS 1.2 09/15/2020 1209   MONOABS 0.5 01/31/2023 0907   EOSABS 0.3 01/31/2023 0907   EOSABS 0.2 09/15/2020 1209   BASOSABS 0.1 01/31/2023 0907   BASOSABS 0.1 09/15/2020 1209    CMP     Component Value Date/Time   NA 136 01/31/2023 0907   NA 135 09/15/2020 1209   K 3.8 01/31/2023 0907   CL 103 01/31/2023 0907   CO2 26 01/31/2023 0907   GLUCOSE 116 (H) 01/31/2023 0907   BUN 10 01/31/2023 0907   BUN 14 09/15/2020 1209   CREATININE 0.80 01/31/2023 0907   CALCIUM 8.9 01/31/2023 0907   PROT 6.2 (L) 01/31/2023 0907   PROT 6.8 09/15/2020 1209   ALBUMIN 3.9 01/31/2023 0907   ALBUMIN 4.6 09/15/2020 1209   AST 19 01/31/2023 0907   ALT 21 01/31/2023 0907   ALKPHOS 87 01/31/2023 0907   BILITOT 0.4 01/31/2023 0907   GFRNONAA >60 01/31/2023 0907   GFRAA 74 09/15/2020 1209  ASSESSMENT and THERAPY PLAN:   No problem-specific Assessment & Plan notes found for this encounter.    All questions were answered. The patient knows to call the clinic with any problems, questions or concerns. We can certainly see the patient much sooner if necessary.  Total encounter time:30 minutes*in face-to-face visit time, chart review, lab review, care coordination, order entry, and documentation of the encounter time.    Lillard Anes, NP 01/31/23 9:58 AM Medical Oncology and Hematology [facility] [address] [place], [place] [address] Tel. [phone]    Fax. [phone]  *Total Encounter Time as defined by the Centers for Medicare and Medicaid Services includes, in addition to the face-to-face time of a patient visit (documented in the note above) non-face-to-face time: obtaining and reviewing outside history, ordering and reviewing medications, tests or procedures, care coordination (communications with other health care professionals or caregivers) and documentation in the medical record.

## 2023-01-31 NOTE — Progress Notes (Signed)
[facility] Cancer Follow up:    Medicine, [facility] [address] [place] [address]   DIAGNOSIS:  Cancer Staging  Malignant neoplasm of upper-outer quadrant of right breast in female, estrogen receptor positive (HCC) Staging form: Breast, AJCC 8th Edition - Clinical stage from 09/15/2022: Stage IA (cT1c, cN0, cM0, G2, ER+, PR+, HER2+) - Unsigned Stage prefix: Initial diagnosis Histologic grading system: 3 grade system Laterality: Right Staged by: Pathologist and managing physician Stage used in treatment planning: Yes National guidelines used in treatment planning: Yes Type of national guideline used in treatment planning: NCCN - Pathologic stage from 10/05/2022: Stage IA (pT1c, pN0, cM0, G2, ER+, PR+, HER2+) - Signed by Loa Socks, NP on 11/08/2022 Stage prefix: Initial diagnosis Histologic grading system: 3 grade system   SUMMARY OF ONCOLOGIC HISTORY: Oncology History  Malignant neoplasm of upper-outer quadrant of right breast in female, estrogen receptor positive (HCC)  08/02/2022 Mammogram   In the right breast, possible distortion warrants further evaluation. In the left breast, no findings suspicious for malignancy. Diagnostic mammogram showed persistent subtle distortion central slightly LATERAL aspect of the RIGHT breast warranting tissue diagnosis.     09/01/2022 Pathology Results   Final pathology showed grade 2 invasive mammary carcinoma, prognostic showed ER 90% positive strong staining PR 90% positive strong staining, Ki-67 of 5% and HER2 3+ by [facility]   09/10/2022 Initial Diagnosis   Malignant neoplasm of upper-outer quadrant of right breast in female, estrogen receptor positive (HCC)    Genetic Testing   [facility] CancerNext-Expanded Panel+RNA was Positive. 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  Report date is 10/04/2022.  The CancerNext-Expanded gene panel offered by [facility] and includes sequencing, rearrangement, and RNA analysis for the following 77 genes: AIP, ALK, APC, ATM, AXIN2, BAP1, BARD1, BLM, BMPR1A, BRCA1, BRCA2, BRIP1, CDC73, CDH1, CDK4, CDKN1B, CDKN2A, CHEK2, CTNNA1, DICER1, FANCC, FH, FLCN, GALNT12, KIF1B, LZTR1, MAX, MEN1, MET, MLH1, MSH2, MSH3, MSH6, MUTYH, NBN, NF1, NF2, NTHL1, PALB2, PHOX2B, PMS2, POT1, PRKAR1A, PTCH1, PTEN, RAD51C, RAD51D, RB1, RECQL, RET, SDHA, SDHAF2, SDHB, SDHC, SDHD, SMAD4, SMARCA4, SMARCB1, SMARCE1, STK11, SUFU, TMEM127, TP53, TSC1, TSC2, VHL and XRCC2 (sequencing and deletion/duplication); EGFR, EGLN1, HOXB13, KIT, MITF, PDGFRA, POLD1, and POLE (sequencing only); EPCAM and GREM1 (deletion/duplication only).    10/05/2022 Surgery   Right breast lumpectomy with Dr. 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  Grade 2, 2 sentinel lymph nodes negative for metastasis.  Margins negative.  T1c, N0.   10/05/2022 Cancer Staging   Staging form: Breast, AJCC 8th Edition - Pathologic stage from 10/05/2022: Stage IA (pT1c, pN0, cM0, G2, ER+, PR+, HER2+) - Signed by Loa Socks, NP on 11/08/2022 Stage prefix: Initial diagnosis Histologic grading system: 3 grade system   11/08/2022 -  Chemotherapy   Patient is on Treatment Plan : BREAST Paclitaxel + Trastuzumab q7d / Trastuzumab q21d       CURRENT THERAPY: Taxol/Herceptin  INTERVAL HISTORY: Judy Morrison 72 y.o. female returns for follow-up and evaluation prior to receiving her eighth week of Taxol and Herceptin.  Her most recent echocardiogram occurred on October 18, 2022 demonstrating a left ventricular ejection fraction of 60 to 65%.  Normal global longitudinal strain.  Her next echocardiogram is scheduled on Feb 02, 2023.  She is feeling well today. She denies any new complaints for me.  She had some  lingering cough but this has also improved.  She continues  to have some memory issues according to her daughter but she is functional.  No neuropathy reported.  No nausea, vomiting or diarrhea.  No fevers or chills.  Since her last visit she had an MRI brain because of persistent memory issues which was done on Saturday, no obvious findings on my read but waiting for the official radiology report.  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   Malignant neoplasm of upper-outer quadrant of right breast in female, estrogen receptor positive (HCC) 09/10/2022   Rash and other nonspecific skin eruption 09/15/2020   Allergic contact dermatitis 09/15/2020   Pruritus 08/14/2020   Dizziness 05/10/2018   Atypical chest pain 05/10/2018   Primary osteoarthritis of left knee 01/06/2018   S/P knee replacement 08/12/2017   Incomplete tear of right rotator cuff 05/19/2017   Osteopenia 05/19/2017   Urge incontinence of urine 05/19/2017   Primary open angle glaucoma (POAG) 12/30/2016   Primary osteoarthritis of both knees 12/30/2016   Recurrent oral herpes simplex infection 12/30/2016   Tubular adenoma of colon 12/30/2016   Hypothyroidism 06/25/2016   Right foot pain 12/19/2014   Anxiety 05/21/2011   Seasonal allergies 05/21/2011    is allergic to penicillin g, cephalexin, codeine, erythromycin, and penicillins.  MEDICAL HISTORY: Past Medical History:  Diagnosis Date   Anemia    Anxiety    Arthritis    Depressed    Dermatitis    rare form, on prednisone   GERD (gastroesophageal reflux disease)    controlled with Omeprazole   Glaucoma    Torn rotator cuff    RIGHT    Urinary tract infection    dx 05-26-17 took 1 week cipro BID completed 06-02-17.    SURGICAL HISTORY: Past Surgical History:  Procedure Laterality Date   ADENOIDECTOMY     APPENDECTOMY     arthroscopic right  knee      BREAST BIOPSY Right 09/01/2022   MM RT BREAST BX W LOC DEV 1ST LESION IMAGE BX SPEC STEREO GUIDE 09/01/2022 [facility]   BREAST BIOPSY  10/01/2022   MM RT RADIOACTIVE SEED LOC MAMMO GUIDE 10/01/2022 [facility]   BREAST LUMPECTOMY WITH RADIOACTIVE SEED AND SENTINEL LYMPH NODE BIOPSY Right 10/05/2022   Procedure: RIGHT BREAST LUMPECTOMY WITH RADIOACTIVE SEED AND AXILLARY SENTINEL LYMPH NODE BIOPSY;  Surgeon: [name], MD;  Location: [facility];  Service: General;  Laterality: Right;   CHOLECYSTECTOMY     COLONOSCOPY  06/06/2017   Pyrtle   Pelvic sling     x2   POLYPECTOMY     PORT A CATH REVISION N/A 11/22/2022   Procedure: PORT A CATH REVISION;  Surgeon: [name], MD;  Location: [facility];  Service: General;  Laterality: N/A;   PORTACATH PLACEMENT Left 10/05/2022   Procedure: INSERTION PORT-A-CATH;  Surgeon: [name], MD;  Location: [facility];  Service: General;  Laterality: Left;   ROTATOR CUFF REPAIR Right    TONSILLECTOMY     TOTAL KNEE ARTHROPLASTY Right 08/12/2017   Procedure: RIGHT TOTAL KNEE ARTHROPLASTY;  Surgeon: Eugenia Mcalpine, MD;  Location: [facility];  Service: Orthopedics;  Laterality: Right;   TOTAL KNEE ARTHROPLASTY Left 01/06/2018   Procedure: LEFT TOTAL KNEE ARTHROPLASTY;  Surgeon: Eugenia Mcalpine, MD;  Location:  [facility];  Service: Orthopedics;  Laterality: Left;   UPPER GASTROINTESTINAL ENDOSCOPY     WISDOM TOOTH EXTRACTION      SOCIAL HISTORY: Social History   Socioeconomic History   Marital status: Single    Spouse name: Not on file   Number of children: Not on file   Years of education: Not on file   Highest education level: Not on file  Occupational History   Not on file  Tobacco Use   Smoking status: Former    Types: Cigarettes    Quit date: 65    Years since quitting: 38.4   Smokeless tobacco: Never  Vaping Use   Vaping Use: Never used  Substance and Sexual Activity    Alcohol use: Yes    Alcohol/week: 2.0 standard drinks of alcohol    Types: 2 Cans of beer per week   Drug use: No   Sexual activity: Not on file  Other Topics Concern   Not on file  Social History Narrative   Not on file   Social Determinants of Health   Financial Resource Strain: Not on file  Food Insecurity: No Food Insecurity (09/15/2022)   Hunger Vital Sign    Worried About Running Out of Food in the Last Year: Never true    Ran Out of Food in the Last Year: Never true  Transportation Needs: No Transportation Needs (09/15/2022)   PRAPARE - Administrator, Civil Service (Medical): No    Lack of Transportation (Non-Medical): No  Physical Activity: Not on file  Stress: Not on file  Social Connections: Not on file  Intimate Partner Violence: Not on file    FAMILY HISTORY: Family History  Problem Relation Age of Onset   Bowel Disease Mother        ishemic bowel   Lung cancer Mother        smoked   Cervical cancer Paternal Aunt    Cancer Maternal Grandfather        unknown type   Lung cancer Paternal Grandfather        smoked   Colon cancer Neg Hx    Breast cancer Neg Hx    Esophageal cancer Neg Hx    Pancreatic cancer Neg Hx    Prostate cancer Neg Hx    Rectal cancer Neg Hx    Stomach cancer Neg Hx    Allergic rhinitis Neg Hx    Asthma Neg Hx    Eczema Neg Hx    Urticaria Neg Hx    Colon polyps Neg Hx     Review of Systems  Constitutional:  Negative for appetite change, chills, fatigue, fever and unexpected weight change.  HENT:   Negative for hearing loss, lump/mass and trouble swallowing.   Eyes:  Negative for eye problems and icterus.  Respiratory:  Positive for cough. Negative for chest tightness and shortness of breath.   Cardiovascular:  Negative for chest pain, leg swelling and palpitations.  Gastrointestinal:  Negative for abdominal distention, abdominal pain, constipation, diarrhea, nausea and vomiting.  Endocrine: Negative for hot flashes.   Genitourinary:  Negative for difficulty urinating.   Musculoskeletal:  Negative for arthralgias and gait problem.  Skin:  Negative for itching and rash.  Neurological:  Negative for dizziness, extremity weakness, gait problem, headaches and numbness.  Hematological:  Negative for adenopathy. Does not bruise/bleed easily.  Psychiatric/Behavioral:  Negative for depression. The patient is not nervous/anxious.       PHYSICAL EXAMINATION  Vitals:   01/31/23 0951  BP: 137/72  Pulse: 99  Resp: 15  Temp: 98.3 F (36.8 C)  SpO2: 95%    Physical Exam Constitutional:      General: She is not in acute distress.    Appearance: Normal appearance. She is not toxic-appearing.  HENT:     Head: Normocephalic and atraumatic.     Mouth/Throat:     Mouth: Mucous membranes are moist.     Pharynx: Oropharynx is clear. No oropharyngeal exudate or posterior oropharyngeal erythema.  Eyes:     General: No scleral icterus. Cardiovascular:     Rate and Rhythm: Normal rate and regular rhythm.     Pulses: Normal pulses.     Heart sounds: Normal heart sounds.  Pulmonary:     Effort: Pulmonary effort is normal.     Breath sounds: Normal breath sounds.  Abdominal:     General: Abdomen is flat. Bowel sounds are normal. There is no distension.     Palpations: Abdomen is soft.     Tenderness: There is no abdominal tenderness.  Musculoskeletal:        General: No swelling.     Cervical back: Neck supple.  Lymphadenopathy:     Cervical: No cervical adenopathy.  Skin:    General: Skin is warm and dry.     Findings: No rash.  Neurological:     General: No focal deficit present.     Mental Status: She is alert and oriented to person, place, and time.     Cranial Nerves: No cranial nerve deficit.     Motor: No weakness.     Coordination: Coordination normal.  Psychiatric:        Mood and Affect: Mood normal.        Behavior: Behavior normal.     LABORATORY DATA:  CBC    Component  Value Date/Time   WBC 5.1 01/31/2023 0907   WBC 10.1 10/01/2021 1314   RBC 3.65 (L) 01/31/2023 0907   HGB 9.8 (L) 01/31/2023 0907   HGB 13.7 09/15/2020 1209   HCT 30.2 (L) 01/31/2023 0907   HCT 41.3 09/15/2020 1209   PLT 333 01/31/2023 0907   PLT 349 09/15/2020 1209   MCV 82.7 01/31/2023 0907   MCV 86 09/15/2020 1209   MCH 26.8 01/31/2023 0907   MCHC 32.5 01/31/2023 0907   RDW 17.7 (H) 01/31/2023 0907   RDW 12.3 09/15/2020 1209   LYMPHSABS 0.7 01/31/2023 0907   LYMPHSABS 1.2 09/15/2020 1209   MONOABS 0.5 01/31/2023 0907   EOSABS 0.3 01/31/2023 0907   EOSABS 0.2 09/15/2020 1209   BASOSABS 0.1 01/31/2023 0907   BASOSABS 0.1 09/15/2020 1209    CMP     Component Value Date/Time   NA 136 01/31/2023 0907   NA 135 09/15/2020 1209   K 3.8 01/31/2023 0907   CL 103 01/31/2023 0907   CO2 26 01/31/2023 0907   GLUCOSE 116 (H) 01/31/2023 0907   BUN 10 01/31/2023 0907   BUN 14 09/15/2020 1209   CREATININE 0.80 01/31/2023 0907   CALCIUM 8.9 01/31/2023 0907   PROT 6.2 (L) 01/31/2023 0907   PROT 6.8 09/15/2020 1209   ALBUMIN 3.9 01/31/2023 0907   ALBUMIN 4.6 09/15/2020 1209   AST 19 01/31/2023 0907   ALT 21 01/31/2023 0907   ALKPHOS 87 01/31/2023 0907   BILITOT 0.4 01/31/2023 0907   GFRNONAA >60 01/31/2023 0907   GFRAA 74 09/15/2020 1209        ASSESSMENT  and THERAPY PLAN:   Malignant neoplasm of upper-outer quadrant of right breast in female, estrogen receptor positive (HCC) This is a very pleasant 72 year old postmenopausal patient with newly diagnosed right breast IDC, ER and PR positive, HER2 positive by IHC, Ki 67 of 5%? referred to breast MDC for additional recommendations. On the original biopsy, the tumor measured about 10 mm.  She most recently underwent lumpectomy which showed an additional 3 mm of invasive carcinoma.  Given HER2 amplified breast cancer, despite the size especially with the larger sample removed during the biopsy, we have discussed about considering  Taxol and Herceptin. She now continues on weekly Herceptin and Taxol.  She is doing very well on this combination.  No adverse effects reported except for some fogginess, remembering and following simple instructions. No dose limiting toxicity reported otherwise. Physical exam unremarkable otherwise, ok to continue herceptin and taxol as scheduled.  She completed Taxol on 02/08/2023. If she continues to have memory issues, I will send her to cognitive behavioral therapy and rehabilitation.  She is fine with this plan.  She had an MRI since her last visit because of some memory issues, this is pending official report, no major findings based on my evaluation.  Okay to proceed with treatment as scheduled today. Next echo scheduled for 522.  No concerns today from cardiac standpoint Thank you for consulting [place] in the care of this patient.  Please do not hesitate to contact us with any additional questions or concerns.    All questions were answered. The patient knows to call the clinic with any problems, questions or concerns. We can certainly see the patient much sooner if necessary.  Total encounter time:30 minutes*in face-to-face visit time, chart review, lab review, care coordination, order entry, and documentation of the encounter time.   *Total Encounter Time as defined by the Centers for Medicare and Medicaid Services includes, in addition to the face-to-face time of a patient visit (documented in the note above) non-face-to-face time: obtaining and reviewing outside history, ordering and reviewing medications, tests or procedures, care coordination (communications with other health care professionals or caregivers) and documentation in the medical record.

## 2023-01-31 NOTE — Patient Instructions (Signed)
[facility]  Discharge Instructions: Thank you for choosing [facility] to provide your oncology and hematology care.   If you have a lab appointment with the Cancer Center, please go directly to the Cancer Center and check in at the registration area.   Wear comfortable clothing and clothing appropriate for easy access to any Portacath or PICC line.   We strive to give you quality time with your provider. You may need to reschedule your appointment if you arrive late (15 or more minutes).  Arriving late affects you and other patients whose appointments are after yours.  Also, if you miss three or more appointments without notifying the office, you may be dismissed from the clinic at the provider's discretion.      For prescription refill requests, have your pharmacy contact our office and allow 72 hours for refills to be completed.    Today you received the following chemotherapy and/or immunotherapy agents: Trastuzumab, Paclitaxel.       To help prevent nausea and vomiting after your treatment, we encourage you to take your nausea medication as directed.  BELOW ARE SYMPTOMS THAT SHOULD BE REPORTED IMMEDIATELY: *FEVER GREATER THAN 100.4 F (38 C) OR HIGHER *CHILLS OR SWEATING *NAUSEA AND VOMITING THAT IS NOT CONTROLLED WITH YOUR NAUSEA MEDICATION *UNUSUAL SHORTNESS OF BREATH *UNUSUAL BRUISING OR BLEEDING *URINARY PROBLEMS (pain or burning when urinating, or frequent urination) *BOWEL PROBLEMS (unusual diarrhea, constipation, pain near the anus) TENDERNESS IN MOUTH AND THROAT WITH OR WITHOUT PRESENCE OF ULCERS (sore throat, sores in mouth, or a toothache) UNUSUAL RASH, SWELLING OR PAIN  UNUSUAL VAGINAL DISCHARGE OR ITCHING   Items with * indicate a potential emergency and should be followed up as soon as possible or go to the Emergency Department if any problems should occur.  Please show the CHEMOTHERAPY ALERT CARD or IMMUNOTHERAPY  ALERT CARD at check-in to the Emergency Department and triage nurse.  Should you have questions after your visit or need to cancel or reschedule your appointment, please contact [facility]  Dept: [phone]  and follow the prompts.  Office hours are 8:00 a.m. to 4:30 p.m. Monday - Friday. Please note that voicemails left after 4:00 p.m. may not be returned until the following business day.  We are closed weekends and major holidays. You have access to a nurse at all times for urgent questions. Please call the main number to the clinic Dept: [phone] and follow the prompts.   For any non-urgent questions, you may also contact your provider using MyChart. We now offer e-Visits for anyone 18 and older to request care online for non-urgent symptoms. For details visit [email or web address].   Also download the MyChart app! Go to the app store, search "MyChart", open the app, select [place], and log in with your MyChart username and password.   

## 2023-01-31 NOTE — Assessment & Plan Note (Signed)
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 referred to breast [facility] for additional recommendations. On the original biopsy, the tumor measured about 10 mm.  She most recently underwent lumpectomy which showed an additional 3 mm of invasive carcinoma.  Given HER2 amplified breast cancer, despite the size especially with the larger sample removed during the biopsy, we have discussed about considering Taxol and Herceptin. She now continues on weekly Herceptin and Taxol.  She is doing very well on this combination.  No adverse effects reported except for some fogginess, remembering and following simple instructions. No dose limiting toxicity reported otherwise. Physical exam unremarkable otherwise, ok to continue herceptin and taxol as scheduled.  She completed Taxol on 02/08/2023. If she continues to have memory issues, I will send her to cognitive behavioral therapy and rehabilitation.  She is fine with this plan.  She had an MRI since her last visit because of some memory issues, this is pending official report, no major findings based on my evaluation.  Okay to proceed with treatment as scheduled today. Next echo scheduled for 522.  No concerns today from cardiac standpoint Thank you for consulting [place] in the care of this patient.  Please do not hesitate to contact us with any additional questions or concerns.

## 2023-02-02 ENCOUNTER — Other Ambulatory Visit (HOSPITAL_COMMUNITY): Payer: Medicare Other

## 2023-02-04 MED FILL — Dexamethasone Sodium Phosphate Inj 100 MG/10ML: INTRAMUSCULAR | Qty: 1 | Status: AC

## 2023-02-08 ENCOUNTER — Other Ambulatory Visit: Payer: Self-pay

## 2023-02-08 ENCOUNTER — Inpatient Hospital Stay: Payer: Medicare Other

## 2023-02-08 ENCOUNTER — Encounter: Payer: Self-pay | Admitting: *Deleted

## 2023-02-08 ENCOUNTER — Ambulatory Visit: Payer: Medicare Other | Admitting: Hematology and Oncology

## 2023-02-08 ENCOUNTER — Other Ambulatory Visit: Payer: Self-pay | Admitting: *Deleted

## 2023-02-08 VITALS — BP 123/75 | HR 90 | Temp 98.3°F | Resp 16 | Ht 63.0 in | Wt 169.5 lb

## 2023-02-08 DIAGNOSIS — Z5111 Encounter for antineoplastic chemotherapy: Secondary | ICD-10-CM | POA: Diagnosis not present

## 2023-02-08 DIAGNOSIS — Z170 Estrogen receptor positive status [ER+]: Secondary | ICD-10-CM

## 2023-02-08 LAB — CMP (CANCER CENTER ONLY)
ALT: 20 U/L (ref 0–44)
AST: 17 U/L (ref 15–41)
Albumin: 3.8 g/dL (ref 3.5–5.0)
Alkaline Phosphatase: 86 U/L (ref 38–126)
Anion gap: 6 (ref 5–15)
BUN: 10 mg/dL (ref 8–23)
CO2: 28 mmol/L (ref 22–32)
Calcium: 8.3 mg/dL — ABNORMAL LOW (ref 8.9–10.3)
Chloride: 101 mmol/L (ref 98–111)
Creatinine: 0.79 mg/dL (ref 0.44–1.00)
GFR, Estimated: >60 mL/min (ref >60)
Glucose, Bld: 93 mg/dL (ref 70–99)
Potassium: 4 mmol/L (ref 3.5–5.1)
Sodium: 135 mmol/L (ref 135–145)
Total Bilirubin: 0.4 mg/dL (ref 0.3–1.2)
Total Protein: 5.9 g/dL — ABNORMAL LOW (ref 6.5–8.1)

## 2023-02-08 LAB — CBC WITH DIFFERENTIAL (CANCER CENTER ONLY)
Abs Immature Granulocytes: 0.09 10*3/uL — ABNORMAL HIGH (ref 0.00–0.07)
Basophils Absolute: 0 10*3/uL (ref 0.0–0.1)
Basophils Relative: 0 %
Eosinophils Absolute: 0.2 10*3/uL (ref 0.0–0.5)
Eosinophils Relative: 4 %
HCT: 28 % — ABNORMAL LOW (ref 36.0–46.0)
Hemoglobin: 9 g/dL — ABNORMAL LOW (ref 12.0–15.0)
Immature Granulocytes: 2 %
Lymphocytes Relative: 20 %
Lymphs Abs: 0.9 10*3/uL (ref 0.7–4.0)
MCH: 26.7 pg (ref 26.0–34.0)
MCHC: 32.1 g/dL (ref 30.0–36.0)
MCV: 83.1 fL (ref 80.0–100.0)
Monocytes Absolute: 0.6 10*3/uL (ref 0.1–1.0)
Monocytes Relative: 13 %
Neutro Abs: 2.9 10*3/uL (ref 1.7–7.7)
Neutrophils Relative %: 61 %
Platelet Count: 288 10*3/uL (ref 150–400)
RBC: 3.37 MIL/uL — ABNORMAL LOW (ref 3.87–5.11)
RDW: 18.4 % — ABNORMAL HIGH (ref 11.5–15.5)
WBC Count: 4.7 10*3/uL (ref 4.0–10.5)
nRBC: 0 % (ref 0.0–0.2)

## 2023-02-08 MED ORDER — SODIUM CHLORIDE 0.9 % IV SOLN
80.0000 mg/m2 | Freq: Once | INTRAVENOUS | Status: AC
  Administered 2023-02-08: 144 mg via INTRAVENOUS
  Filled 2023-02-08: qty 24

## 2023-02-08 MED ORDER — HEPARIN SOD (PORK) LOCK FLUSH 100 UNIT/ML IV SOLN
500.0000 [IU] | Freq: Once | INTRAVENOUS | Status: AC | PRN
  Administered 2023-02-08: 500 [IU]

## 2023-02-08 MED ORDER — SODIUM CHLORIDE 0.9% FLUSH
10.0000 mL | INTRAVENOUS | Status: DC | PRN
  Administered 2023-02-08: 10 mL

## 2023-02-08 MED ORDER — FAMOTIDINE IN NACL 20-0.9 MG/50ML-% IV SOLN
20.0000 mg | Freq: Once | INTRAVENOUS | Status: AC
  Administered 2023-02-08: 20 mg via INTRAVENOUS
  Filled 2023-02-08: qty 50

## 2023-02-08 MED ORDER — SODIUM CHLORIDE 0.9 % IV SOLN
10.0000 mg | Freq: Once | INTRAVENOUS | Status: AC
  Administered 2023-02-08: 10 mg via INTRAVENOUS
  Filled 2023-02-08: qty 10

## 2023-02-08 MED ORDER — DIPHENHYDRAMINE HCL 50 MG/ML IJ SOLN
50.0000 mg | Freq: Once | INTRAMUSCULAR | Status: AC
  Administered 2023-02-08: 50 mg via INTRAVENOUS
  Filled 2023-02-08: qty 1

## 2023-02-08 MED ORDER — ACETAMINOPHEN 325 MG PO TABS
650.0000 mg | ORAL_TABLET | Freq: Once | ORAL | Status: AC
  Administered 2023-02-08: 650 mg via ORAL
  Filled 2023-02-08: qty 2

## 2023-02-08 MED ORDER — SODIUM CHLORIDE 0.9 % IV SOLN: Freq: Once | INTRAVENOUS | Status: AC

## 2023-02-08 MED ORDER — TRASTUZUMAB-DTTB CHEMO 150 MG IV SOLR
2.0000 mg/kg | Freq: Once | INTRAVENOUS | Status: AC
  Administered 2023-02-08: 150 mg via INTRAVENOUS
  Filled 2023-02-08: qty 7.14

## 2023-02-08 NOTE — Patient Instructions (Signed)
[facility]  Discharge Instructions: Thank you for choosing [facility] to provide your oncology and hematology care.   If you have a lab appointment with the Cancer Center, please go directly to the Cancer Center and check in at the registration area.   Wear comfortable clothing and clothing appropriate for easy access to any Portacath or PICC line.   We strive to give you quality time with your provider. You may need to reschedule your appointment if you arrive late (15 or more minutes).  Arriving late affects you and other patients whose appointments are after yours.  Also, if you miss three or more appointments without notifying the office, you may be dismissed from the clinic at the provider's discretion.      For prescription refill requests, have your pharmacy contact our office and allow 72 hours for refills to be completed.    Today you received the following chemotherapy and/or immunotherapy agents: Trastuzumab, Paclitaxel.       To help prevent nausea and vomiting after your treatment, we encourage you to take your nausea medication as directed.  BELOW ARE SYMPTOMS THAT SHOULD BE REPORTED IMMEDIATELY: *FEVER GREATER THAN 100.4 F (38 C) OR HIGHER *CHILLS OR SWEATING *NAUSEA AND VOMITING THAT IS NOT CONTROLLED WITH YOUR NAUSEA MEDICATION *UNUSUAL SHORTNESS OF BREATH *UNUSUAL BRUISING OR BLEEDING *URINARY PROBLEMS (pain or burning when urinating, or frequent urination) *BOWEL PROBLEMS (unusual diarrhea, constipation, pain near the anus) TENDERNESS IN MOUTH AND THROAT WITH OR WITHOUT PRESENCE OF ULCERS (sore throat, sores in mouth, or a toothache) UNUSUAL RASH, SWELLING OR PAIN  UNUSUAL VAGINAL DISCHARGE OR ITCHING   Items with * indicate a potential emergency and should be followed up as soon as possible or go to the Emergency Department if any problems should occur.  Please show the CHEMOTHERAPY ALERT CARD or IMMUNOTHERAPY  ALERT CARD at check-in to the Emergency Department and triage nurse.  Should you have questions after your visit or need to cancel or reschedule your appointment, please contact [facility]  Dept: [phone]  and follow the prompts.  Office hours are 8:00 a.m. to 4:30 p.m. Monday - Friday. Please note that voicemails left after 4:00 p.m. may not be returned until the following business day.  We are closed weekends and major holidays. You have access to a nurse at all times for urgent questions. Please call the main number to the clinic Dept: [phone] and follow the prompts.   For any non-urgent questions, you may also contact your provider using MyChart. We now offer e-Visits for anyone 18 and older to request care online for non-urgent symptoms. For details visit [email or web address].   Also download the MyChart app! Go to the app store, search "MyChart", open the app, select [place] [facility], and log in with your MyChart username and password.   

## 2023-02-08 NOTE — Progress Notes (Signed)
Per Dr. Al Pimple, okay to start premedications while awaiting updated lab results.

## 2023-02-08 NOTE — Progress Notes (Signed)
Patient last echo 2/5 and next echo scheduled for Monday. Dr. Al Pimple aware.

## 2023-02-09 ENCOUNTER — Telehealth: Payer: Self-pay | Admitting: Hematology and Oncology

## 2023-02-09 NOTE — Telephone Encounter (Signed)
Called to return a voicemail left by the patients daughter Mrs.Westbrook. Left voicemail for patient to call back about upcoming appointments.

## 2023-02-10 ENCOUNTER — Other Ambulatory Visit: Payer: Self-pay | Admitting: *Deleted

## 2023-02-10 DIAGNOSIS — Z170 Estrogen receptor positive status [ER+]: Secondary | ICD-10-CM

## 2023-02-14 ENCOUNTER — Other Ambulatory Visit: Payer: Self-pay

## 2023-02-14 ENCOUNTER — Inpatient Hospital Stay: Payer: Medicare Other | Attending: Hematology and Oncology

## 2023-02-14 ENCOUNTER — Ambulatory Visit (HOSPITAL_COMMUNITY)
Admission: RE | Admit: 2023-02-14 | Discharge: 2023-02-14 | Disposition: A | Discharge status: Home / Self Care | Payer: Medicare Other | Source: Ambulatory Visit | Attending: Hematology and Oncology | Admitting: Hematology and Oncology

## 2023-02-14 ENCOUNTER — Inpatient Hospital Stay: Payer: Medicare Other

## 2023-02-14 VITALS — BP 133/77 | HR 95 | Temp 97.8°F | Resp 18

## 2023-02-14 DIAGNOSIS — Z95828 Presence of other vascular implants and grafts: Secondary | ICD-10-CM

## 2023-02-14 DIAGNOSIS — Z5112 Encounter for antineoplastic immunotherapy: Secondary | ICD-10-CM | POA: Insufficient documentation

## 2023-02-14 DIAGNOSIS — C50411 Malignant neoplasm of upper-outer quadrant of right female breast: Secondary | ICD-10-CM | POA: Insufficient documentation

## 2023-02-14 DIAGNOSIS — Z5181 Encounter for therapeutic drug level monitoring: Secondary | ICD-10-CM | POA: Insufficient documentation

## 2023-02-14 DIAGNOSIS — Z87891 Personal history of nicotine dependence: Secondary | ICD-10-CM | POA: Insufficient documentation

## 2023-02-14 DIAGNOSIS — I351 Nonrheumatic aortic (valve) insufficiency: Secondary | ICD-10-CM | POA: Diagnosis not present

## 2023-02-14 DIAGNOSIS — Z79899 Other long term (current) drug therapy: Secondary | ICD-10-CM | POA: Insufficient documentation

## 2023-02-14 DIAGNOSIS — Z170 Estrogen receptor positive status [ER+]: Secondary | ICD-10-CM | POA: Insufficient documentation

## 2023-02-14 LAB — ECHOCARDIOGRAM COMPLETE
AR max vel: 1.97 cm2
AV Area VTI: 1.99 cm2
AV Area mean vel: 1.9 cm2
AV Mean grad: 7 mmHg
AV Peak grad: 13 mmHg
Ao pk vel: 1.81 m/s
Area-P 1/2: 6.43 cm2
Calc EF: 50 %
MV VTI: 2.98 cm2
P 1/2 time: 503 msec
S' Lateral: 2.6 cm
Single Plane A2C EF: 49 %
Single Plane A4C EF: 48.4 %

## 2023-02-14 LAB — CMP (CANCER CENTER ONLY)
ALT: 24 U/L (ref 0–44)
AST: 18 U/L (ref 15–41)
Albumin: 3.9 g/dL (ref 3.5–5.0)
Alkaline Phosphatase: 85 U/L (ref 38–126)
Anion gap: 4 — ABNORMAL LOW (ref 5–15)
BUN: 12 mg/dL (ref 8–23)
CO2: 28 mmol/L (ref 22–32)
Calcium: 8.6 mg/dL — ABNORMAL LOW (ref 8.9–10.3)
Chloride: 102 mmol/L (ref 98–111)
Creatinine: 0.73 mg/dL (ref 0.44–1.00)
GFR, Estimated: >60 mL/min (ref >60)
Glucose, Bld: 89 mg/dL (ref 70–99)
Potassium: 4.2 mmol/L (ref 3.5–5.1)
Sodium: 134 mmol/L — ABNORMAL LOW (ref 135–145)
Total Bilirubin: 0.4 mg/dL (ref 0.3–1.2)
Total Protein: 6.2 g/dL — ABNORMAL LOW (ref 6.5–8.1)

## 2023-02-14 LAB — CBC WITH DIFFERENTIAL (CANCER CENTER ONLY)
Abs Immature Granulocytes: 0.1 10*3/uL — ABNORMAL HIGH (ref 0.00–0.07)
Basophils Absolute: 0.1 10*3/uL (ref 0.0–0.1)
Basophils Relative: 1 %
Eosinophils Absolute: 0.1 10*3/uL (ref 0.0–0.5)
Eosinophils Relative: 3 %
HCT: 29.2 % — ABNORMAL LOW (ref 36.0–46.0)
Hemoglobin: 9.5 g/dL — ABNORMAL LOW (ref 12.0–15.0)
Immature Granulocytes: 3 %
Lymphocytes Relative: 22 %
Lymphs Abs: 0.8 10*3/uL (ref 0.7–4.0)
MCH: 27.1 pg (ref 26.0–34.0)
MCHC: 32.5 g/dL (ref 30.0–36.0)
MCV: 83.2 fL (ref 80.0–100.0)
Monocytes Absolute: 0.3 10*3/uL (ref 0.1–1.0)
Monocytes Relative: 8 %
Neutro Abs: 2.3 10*3/uL (ref 1.7–7.7)
Neutrophils Relative %: 63 %
Platelet Count: 340 10*3/uL (ref 150–400)
RBC: 3.51 MIL/uL — ABNORMAL LOW (ref 3.87–5.11)
RDW: 18.1 % — ABNORMAL HIGH (ref 11.5–15.5)
WBC Count: 3.7 10*3/uL — ABNORMAL LOW (ref 4.0–10.5)
nRBC: 0 % (ref 0.0–0.2)

## 2023-02-14 MED ORDER — TRASTUZUMAB-DTTB CHEMO 150 MG IV SOLR
6.0000 mg/kg | Freq: Once | INTRAVENOUS | Status: AC
  Administered 2023-02-14: 420 mg via INTRAVENOUS
  Filled 2023-02-14: qty 20

## 2023-02-14 MED ORDER — SODIUM CHLORIDE 0.9% FLUSH
10.0000 mL | Freq: Once | INTRAVENOUS | Status: AC
  Administered 2023-02-14: 10 mL

## 2023-02-14 MED ORDER — SODIUM CHLORIDE 0.9 % IV SOLN: Freq: Once | INTRAVENOUS | Status: AC

## 2023-02-14 MED ORDER — HEPARIN SOD (PORK) LOCK FLUSH 100 UNIT/ML IV SOLN
500.0000 [IU] | Freq: Once | INTRAVENOUS | Status: AC | PRN
  Administered 2023-02-14: 500 [IU]

## 2023-02-14 MED ORDER — ACETAMINOPHEN 325 MG PO TABS
650.0000 mg | ORAL_TABLET | Freq: Once | ORAL | Status: AC
  Administered 2023-02-14: 650 mg via ORAL
  Filled 2023-02-14: qty 2

## 2023-02-14 MED ORDER — SODIUM CHLORIDE 0.9% FLUSH
10.0000 mL | INTRAVENOUS | Status: DC | PRN
  Administered 2023-02-14: 10 mL

## 2023-02-14 MED ORDER — DIPHENHYDRAMINE HCL 25 MG PO CAPS
50.0000 mg | ORAL_CAPSULE | Freq: Once | ORAL | Status: AC
  Administered 2023-02-14: 50 mg via ORAL
  Filled 2023-02-14: qty 2

## 2023-02-14 NOTE — Patient Instructions (Signed)
[facility]  Discharge Instructions: Thank you for choosing [facility] to provide your oncology and hematology care.   If you have a lab appointment with the Cancer Center, please go directly to the Cancer Center and check in at the registration area.   Wear comfortable clothing and clothing appropriate for easy access to any Portacath or PICC line.   We strive to give you quality time with your provider. You may need to reschedule your appointment if you arrive late (15 or more minutes).  Arriving late affects you and other patients whose appointments are after yours.  Also, if you miss three or more appointments without notifying the office, you may be dismissed from the clinic at the provider's discretion.      For prescription refill requests, have your pharmacy contact our office and allow 72 hours for refills to be completed.    Today you received the following chemotherapy and/or immunotherapy agents: Trastuzumab      To help prevent nausea and vomiting after your treatment, we encourage you to take your nausea medication as directed.  BELOW ARE SYMPTOMS THAT SHOULD BE REPORTED IMMEDIATELY: *FEVER GREATER THAN 100.4 F (38 C) OR HIGHER *CHILLS OR SWEATING *NAUSEA AND VOMITING THAT IS NOT CONTROLLED WITH YOUR NAUSEA MEDICATION *UNUSUAL SHORTNESS OF BREATH *UNUSUAL BRUISING OR BLEEDING *URINARY PROBLEMS (pain or burning when urinating, or frequent urination) *BOWEL PROBLEMS (unusual diarrhea, constipation, pain near the anus) TENDERNESS IN MOUTH AND THROAT WITH OR WITHOUT PRESENCE OF ULCERS (sore throat, sores in mouth, or a toothache) UNUSUAL RASH, SWELLING OR PAIN  UNUSUAL VAGINAL DISCHARGE OR ITCHING   Items with * indicate a potential emergency and should be followed up as soon as possible or go to the Emergency Department if any problems should occur.  Please show the CHEMOTHERAPY ALERT CARD or IMMUNOTHERAPY ALERT CARD at  check-in to the Emergency Department and triage nurse.  Should you have questions after your visit or need to cancel or reschedule your appointment, please contact [facility]  Dept: [phone]  and follow the prompts.  Office hours are 8:00 a.m. to 4:30 p.m. Monday - Friday. Please note that voicemails left after 4:00 p.m. may not be returned until the following business day.  We are closed weekends and major holidays. You have access to a nurse at all times for urgent questions. Please call the main number to the clinic Dept: [phone] and follow the prompts.   For any non-urgent questions, you may also contact your provider using MyChart. We now offer e-Visits for anyone 18 and older to request care online for non-urgent symptoms. For details visit [email or web address].   Also download the MyChart app! Go to the app store, search "MyChart", open the app, select [facility], and log in with your MyChart username and password.  

## 2023-02-15 ENCOUNTER — Other Ambulatory Visit: Payer: Self-pay | Admitting: Hematology and Oncology

## 2023-02-15 NOTE — Progress Notes (Signed)
Herceptin on hold because of ECHO changes She will need cardiology eval before we can resume.

## 2023-02-16 ENCOUNTER — Telehealth: Payer: Self-pay | Admitting: *Deleted

## 2023-02-16 DIAGNOSIS — Z170 Estrogen receptor positive status [ER+]: Secondary | ICD-10-CM

## 2023-02-16 DIAGNOSIS — R931 Abnormal findings on diagnostic imaging of heart and coronary circulation: Secondary | ICD-10-CM

## 2023-02-16 NOTE — Telephone Encounter (Signed)
This RN received message per VM left yesterday from pt' daughter -Judy Morrison  Discussed that pt's most recent ECHO showed mild changes- which is most likely related to the trastuzamab therapy.  Protocol is for cardiology to review and give appropriate recommendations that usually allow [place] to continue therapy with resolution of issue.  Lee verbalized understanding.  This RN placed referral to the [facility].

## 2023-02-22 ENCOUNTER — Ambulatory Visit (HOSPITAL_COMMUNITY)
Admission: RE | Admit: 2023-02-22 | Discharge: 2023-02-22 | Disposition: A | Discharge status: Home / Self Care | Payer: Medicare Other | Source: Ambulatory Visit | Attending: Cardiology | Admitting: Cardiology

## 2023-02-22 ENCOUNTER — Encounter (HOSPITAL_COMMUNITY): Payer: Self-pay | Admitting: Cardiology

## 2023-02-22 VITALS — BP 124/70 | HR 97 | Wt 173.6 lb

## 2023-02-22 DIAGNOSIS — I447 Left bundle-branch block, unspecified: Secondary | ICD-10-CM | POA: Diagnosis not present

## 2023-02-22 DIAGNOSIS — Z5181 Encounter for therapeutic drug level monitoring: Secondary | ICD-10-CM | POA: Diagnosis not present

## 2023-02-22 DIAGNOSIS — I5089 Other heart failure: Secondary | ICD-10-CM | POA: Diagnosis not present

## 2023-02-22 DIAGNOSIS — C50411 Malignant neoplasm of upper-outer quadrant of right female breast: Secondary | ICD-10-CM | POA: Diagnosis not present

## 2023-02-22 DIAGNOSIS — Z7969 Long term (current) use of other immunomodulators and immunosuppressants: Secondary | ICD-10-CM | POA: Diagnosis not present

## 2023-02-22 DIAGNOSIS — C50911 Malignant neoplasm of unspecified site of right female breast: Secondary | ICD-10-CM | POA: Diagnosis not present

## 2023-02-22 DIAGNOSIS — Z170 Estrogen receptor positive status [ER+]: Secondary | ICD-10-CM | POA: Diagnosis not present

## 2023-02-22 DIAGNOSIS — Z79633 Long term (current) use of mitotic inhibitor: Secondary | ICD-10-CM | POA: Diagnosis not present

## 2023-02-22 DIAGNOSIS — Z79899 Other long term (current) drug therapy: Secondary | ICD-10-CM | POA: Diagnosis not present

## 2023-02-22 LAB — BASIC METABOLIC PANEL
Anion gap: 8 (ref 5–15)
BUN: 6 mg/dL — ABNORMAL LOW (ref 8–23)
CO2: 27 mmol/L (ref 22–32)
Calcium: 8.7 mg/dL — ABNORMAL LOW (ref 8.9–10.3)
Chloride: 102 mmol/L (ref 98–111)
Creatinine, Ser: 0.88 mg/dL (ref 0.44–1.00)
GFR, Estimated: >60 mL/min (ref >60)
Glucose, Bld: 106 mg/dL — ABNORMAL HIGH (ref 70–99)
Potassium: 4.1 mmol/L (ref 3.5–5.1)
Sodium: 137 mmol/L (ref 135–145)

## 2023-02-22 LAB — BRAIN NATRIURETIC PEPTIDE: B Natriuretic Peptide: 16.2 pg/mL (ref 0.0–100.0)

## 2023-02-22 NOTE — Progress Notes (Addendum)
ADVANCED HEART FAILURE CLINIC NOTE  Referring Physician: Medicine, [facility]*  Primary Care: Medicine, [facility] Family Oncologist: Dr. Al Pimple  HPI: Judy Morrison is a 72 y.o. female with breast cancer presenting today to establish care.  Her cancer history dates back to November 2023 when screening mammogram demonstrated right breast irregularity.  The following months she had a pathology confirmed grade 2 invasive mammary carcinoma.  She underwent right breast lumpectomy by Dr. 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  She was started on chemotherapy in November 08, 2022 with paclitaxel, trastuzumab.  Interval hx:  Since starting chemotherapy she reports her only symptoms have been hair loss, very mild fatigue and vague memory deficits.  From a functional standpoint she continues to do very well.  No symptoms consistent with heart failure or CAD.  She does have 1+ pitting edema at her feet, however, did attend a long graduation ceremony.  Past Medical History:  Diagnosis Date   Anemia    Anxiety    Arthritis    Depressed    Dermatitis    rare form, on prednisone   GERD (gastroesophageal reflux disease)    controlled with Omeprazole   Glaucoma    Torn rotator cuff    RIGHT    Urinary tract infection    dx 05-26-17 took 1 week cipro BID completed 06-02-17.    Current Outpatient Medications  Medication Sig Dispense Refill   Adapalene 0.3 % gel Apply 1 application daily as needed topically (for acne).     ALPRAZolam (XANAX) 0.25 MG tablet Take 0.25 mg by mouth daily as needed for anxiety.   1   brexpiprazole (REXULTI) 2 MG TABS tablet Take 2 mg by mouth daily after breakfast.     clobetasol cream (TEMOVATE) 0.05 % Apply 1 Application topically 2 (two) times daily as needed (irritation).     clonazePAM (KLONOPIN) 0.5 MG tablet Take 1 mg by mouth 2 (two) times daily.  Patient takes 1.0 mg AM and 0.5 mg every PM     clotrimazole (MYCELEX) 10 MG troche Take 10 mg by mouth as directed. 2 times per week     DULoxetine (CYMBALTA) 20 MG capsule Take 40 mg by mouth daily after breakfast.  1   hydrOXYzine (VISTARIL) 25 MG capsule Take 25-50 mg by mouth daily.     latanoprost (XALATAN) 0.005 % ophthalmic solution Place 1 drop into both eyes at bedtime.     levothyroxine (SYNTHROID, LEVOTHROID) 50 MCG tablet Take 50 mcg at bedtime by mouth.      lidocaine-prilocaine (EMLA) cream Apply 1 Application to affected area topically once daily. 30 g 3   loratadine (CLARITIN) 10 MG tablet Take 10 mg by mouth daily.     nortriptyline (PAMELOR) 50 MG capsule Take 100 mg at bedtime by mouth.      omeprazole (PRILOSEC) 40 MG capsule Take 40 mg by mouth daily before breakfast.     ondansetron (ZOFRAN) 8 MG tablet Take 1 tablet (8 mg total) by mouth every 8 (eight) hours as needed for nausea or vomiting. 30 tablet 1   OVER THE COUNTER MEDICATION 20 mLs by Mouth Rinse route 4 (four) times daily as needed (mouth pain). Oral-B Sore Mouth Rinse     prochlorperazine (COMPAZINE) 10 MG tablet Take 1 tablet (10 mg total) by mouth every 6 (six) hours as needed for nausea or vomiting. 30 tablet 1  simvastatin (ZOCOR) 10 MG tablet Take 10 mg by mouth daily.     tacrolimus (PROGRAF) 1 MG capsule Take 1 mg by mouth 2 (two) times daily. Per patient - Dissolve capsule in one half liter of H2O. Swish and spit twice daily     triamcinolone cream (KENALOG) 0.1 % Apply 1 Application topically 2 (two) times daily as needed (irritation).     valACYclovir (VALTREX) 1000 MG tablet Take 1,000 mg by mouth daily as needed (outbreaks).     No current facility-administered medications for this encounter.   Facility-Administered Medications Ordered in Other Encounters  Medication Dose Route Frequency Provider Last Rate Last Admin   sodium chloride flush (NS) 0.9 % injection 10 mL  10 mL Intracatheter PRN  Iruku, Burnice Logan, MD        Allergies  Allergen Reactions   Penicillin G Anaphylaxis   Cephalexin Other (See Comments)    Pt does not remember reaction    Codeine Nausea Only   Erythromycin Itching   Penicillins Itching, Swelling and Other (See Comments)    Has patient had a PCN reaction causing immediate rash, facial/tongue/throat swelling, SOB or lightheadedness with hypotension: Yes Has patient had a PCN reaction causing severe rash involving mucus membranes or skin necrosis: No Has patient had a PCN reaction that required hospitalization: No Has patient had a PCN reaction occurring within the last 10 years: No If all of the above answers are "NO", then may proceed with Cephalosporin use.       Social History   Socioeconomic History   Marital status: Single    Spouse name: Not on file   Number of children: Not on file   Years of education: Not on file   Highest education level: Not on file  Occupational History   Not on file  Tobacco Use   Smoking status: Former    Types: Cigarettes    Quit date: 54    Years since quitting: 38.4   Smokeless tobacco: Never  Vaping Use   Vaping Use: Never used  Substance and Sexual Activity   Alcohol use: Yes    Alcohol/week: 2.0 standard drinks of alcohol    Types: 2 Cans of beer per week   Drug use: No   Sexual activity: Not on file  Other Topics Concern   Not on file  Social History Narrative   Not on file   Social Determinants of Health   Financial Resource Strain: Not on file  Food Insecurity: No Food Insecurity (09/15/2022)   Hunger Vital Sign    Worried About Running Out of Food in the Last Year: Never true    Ran Out of Food in the Last Year: Never true  Transportation Needs: No Transportation Needs (09/15/2022)   PRAPARE - Administrator, Civil Service (Medical): No    Lack of Transportation (Non-Medical): No  Physical Activity: Not on file  Stress: Not on file  Social Connections: Not on file  Intimate  Partner Violence: Not on file      Family History  Problem Relation Age of Onset   Bowel Disease Mother        ishemic bowel   Lung cancer Mother        smoked   Cervical cancer Paternal Aunt    Cancer Maternal Grandfather        unknown type   Lung cancer Paternal Grandfather        smoked   Colon cancer Neg Hx  Breast cancer Neg Hx    Esophageal cancer Neg Hx    Pancreatic cancer Neg Hx    Prostate cancer Neg Hx    Rectal cancer Neg Hx    Stomach cancer Neg Hx    Allergic rhinitis Neg Hx    Asthma Neg Hx    Eczema Neg Hx    Urticaria Neg Hx    Colon polyps Neg Hx     PHYSICAL EXAM: Vitals:   02/22/23 1024  BP: 124/70  Pulse: 97  SpO2: 96%   GENERAL: Well nourished, well developed, and in no apparent distress at rest.  HEENT: Negative for arcus senilis or xanthelasma. There is no scleral icterus.  The mucous membranes are pink and moist.   NECK: Supple, No masses. Normal carotid upstrokes without bruits. No masses or thyromegaly.    CHEST: There are no chest wall deformities. There is no chest wall tenderness. Respirations are unlabored.  Lungs-CTA bilaterally CARDIAC:  JVP: 7 cm H2O         Normal S1, S2  Normal rate with regular rhythm. No murmurs, rubs or gallops.  Pulses are 2+ and symmetrical in upper and lower extremities.  1+ edema.  ABDOMEN: Soft, non-tender, non-distended. There are no masses or hepatomegaly. There are normal bowel sounds.  EXTREMITIES: Warm and well perfused with no cyanosis, clubbing.  LYMPHATIC: No axillary or supraclavicular lymphadenopathy.  NEUROLOGIC: Patient is oriented x3 with no focal or lateralizing neurologic deficits.  PSYCH: Patients affect is appropriate, there is no evidence of anxiety or depression.  SKIN: Warm and dry; no lesions or wounds.   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  GLS has suboptimal tracing and is mostly unchanged.  From a symptomatic standpoint she is doing very well.  Will obtain BMP/BNP today.  Plan for repeat echocardiogram in 6 weeks for repeat assessment.  At this time we will continue current chemotherapy regimen.  I had a very lengthy discussion with her and her daughter regarding risks and benefits of continued chemotherapy with regard to cardiotoxicity. -Will discuss with Dr. Al Pimple.   2.  Invasive ductal carcinoma -Following with Dr. Al Pimple -Completed initial chemo regimen; now planning to continue herceptin w/ radiation to follow.    Lyla Jasek Advanced [facility] Mechanical Circulatory Support

## 2023-02-22 NOTE — Patient Instructions (Signed)
There has been no changes to your medications.  Labs done today, your results will be available in MyChart, we will contact you for abnormal readings.  Your physician has requested that you have an echocardiogram. Echocardiography is a painless test that uses sound waves to create images of your heart. It provides your doctor with information about the size and shape of your heart and how well your heart's chambers and valves are working. This procedure takes approximately one hour. There are no restrictions for this procedure. Please do NOT wear cologne, perfume, aftershave, or lotions (deodorant is allowed). Please arrive 15 minutes prior to your appointment time.  Your physician recommends that you schedule a follow-up appointment in: 6 weeks with an echocardiogram   If you have any questions or concerns before your next appointment please send us a message through mychart or call our office at [phone].    TO LEAVE A MESSAGE FOR THE NURSE SELECT OPTION 2, PLEASE LEAVE A MESSAGE INCLUDING: YOUR NAME DATE OF BIRTH CALL BACK NUMBER REASON FOR CALL**this is important as we prioritize the call backs  YOU WILL RECEIVE A CALL BACK THE SAME DAY AS LONG AS YOU CALL BEFORE 4:00 PM  At the [facility], you and your health needs are our priority. As part of our continuing mission to provide you with exceptional heart care, we have created designated Provider Care Teams. These Care Teams include your primary Cardiologist (physician) and Advanced Practice Providers (APPs- Physician Assistants and Nurse Practitioners) who all work together to provide you with the care you need, when you need it.   You may see any of the following providers on your designated Care Team at your next follow up: Dr Daniel Bensimhon Dr Dalton McLean Dr. Aditya Sabharwal Amy Clegg, NP Brittainy Simmons, PA Jessica Milford,NP Lindsay Finch, PA Alma Diaz, NP Lauren Kemp, PharmD   Please be sure  to bring in all your medications bottles to every appointment.    Thank you for choosing [facility]    

## 2023-02-23 ENCOUNTER — Other Ambulatory Visit: Payer: Self-pay

## 2023-02-26 ENCOUNTER — Other Ambulatory Visit: Payer: Self-pay

## 2023-02-28 ENCOUNTER — Ambulatory Visit: Payer: Medicare Other

## 2023-02-28 ENCOUNTER — Ambulatory Visit: Payer: Medicare Other | Admitting: Hematology and Oncology

## 2023-03-02 ENCOUNTER — Ambulatory Visit: Payer: Medicare Other | Admitting: Radiation Oncology

## 2023-03-02 ENCOUNTER — Ambulatory Visit: Payer: Medicare Other

## 2023-03-03 ENCOUNTER — Other Ambulatory Visit: Payer: Self-pay | Admitting: *Deleted

## 2023-03-03 DIAGNOSIS — Z170 Estrogen receptor positive status [ER+]: Secondary | ICD-10-CM

## 2023-03-07 ENCOUNTER — Inpatient Hospital Stay: Payer: Medicare Other

## 2023-03-07 ENCOUNTER — Other Ambulatory Visit: Payer: Self-pay

## 2023-03-07 ENCOUNTER — Inpatient Hospital Stay (HOSPITAL_BASED_OUTPATIENT_CLINIC_OR_DEPARTMENT_OTHER): Payer: Medicare Other | Admitting: Hematology and Oncology

## 2023-03-07 VITALS — BP 137/77 | HR 96 | Temp 98.1°F | Resp 16 | Wt 169.7 lb

## 2023-03-07 DIAGNOSIS — Z5112 Encounter for antineoplastic immunotherapy: Secondary | ICD-10-CM | POA: Diagnosis not present

## 2023-03-07 DIAGNOSIS — Z170 Estrogen receptor positive status [ER+]: Secondary | ICD-10-CM

## 2023-03-07 DIAGNOSIS — C50411 Malignant neoplasm of upper-outer quadrant of right female breast: Secondary | ICD-10-CM

## 2023-03-07 DIAGNOSIS — Z95828 Presence of other vascular implants and grafts: Secondary | ICD-10-CM

## 2023-03-07 LAB — CBC WITH DIFFERENTIAL (CANCER CENTER ONLY)
Abs Immature Granulocytes: 0.03 10*3/uL (ref 0.00–0.07)
Basophils Absolute: 0.1 10*3/uL (ref 0.0–0.1)
Basophils Relative: 1 %
Eosinophils Absolute: 0.4 10*3/uL (ref 0.0–0.5)
Eosinophils Relative: 5 %
HCT: 31 % — ABNORMAL LOW (ref 36.0–46.0)
Hemoglobin: 9.9 g/dL — ABNORMAL LOW (ref 12.0–15.0)
Immature Granulocytes: 0 %
Lymphocytes Relative: 18 %
Lymphs Abs: 1.3 10*3/uL (ref 0.7–4.0)
MCH: 26.4 pg (ref 26.0–34.0)
MCHC: 31.9 g/dL (ref 30.0–36.0)
MCV: 82.7 fL (ref 80.0–100.0)
Monocytes Absolute: 0.9 10*3/uL (ref 0.1–1.0)
Monocytes Relative: 12 %
Neutro Abs: 4.9 10*3/uL (ref 1.7–7.7)
Neutrophils Relative %: 64 %
Platelet Count: 334 10*3/uL (ref 150–400)
RBC: 3.75 MIL/uL — ABNORMAL LOW (ref 3.87–5.11)
RDW: 17.5 % — ABNORMAL HIGH (ref 11.5–15.5)
WBC Count: 7.7 10*3/uL (ref 4.0–10.5)
nRBC: 0 % (ref 0.0–0.2)

## 2023-03-07 LAB — CMP (CANCER CENTER ONLY)
ALT: 12 U/L (ref 0–44)
AST: 16 U/L (ref 15–41)
Albumin: 3.9 g/dL (ref 3.5–5.0)
Alkaline Phosphatase: 103 U/L (ref 38–126)
Anion gap: 6 (ref 5–15)
BUN: 8 mg/dL (ref 8–23)
CO2: 28 mmol/L (ref 22–32)
Calcium: 8.9 mg/dL (ref 8.9–10.3)
Chloride: 102 mmol/L (ref 98–111)
Creatinine: 0.84 mg/dL (ref 0.44–1.00)
GFR, Estimated: >60 mL/min (ref >60)
Glucose, Bld: 89 mg/dL (ref 70–99)
Potassium: 4.3 mmol/L (ref 3.5–5.1)
Sodium: 136 mmol/L (ref 135–145)
Total Bilirubin: 0.3 mg/dL (ref 0.3–1.2)
Total Protein: 6.2 g/dL — ABNORMAL LOW (ref 6.5–8.1)

## 2023-03-07 MED ORDER — DIPHENHYDRAMINE HCL 25 MG PO CAPS
50.0000 mg | ORAL_CAPSULE | Freq: Once | ORAL | Status: AC
  Administered 2023-03-07: 50 mg via ORAL
  Filled 2023-03-07: qty 2

## 2023-03-07 MED ORDER — SODIUM CHLORIDE 0.9% FLUSH
10.0000 mL | INTRAVENOUS | Status: DC | PRN
  Administered 2023-03-07: 10 mL

## 2023-03-07 MED ORDER — SODIUM CHLORIDE 0.9% FLUSH
10.0000 mL | Freq: Once | INTRAVENOUS | Status: AC
  Administered 2023-03-07: 10 mL

## 2023-03-07 MED ORDER — SODIUM CHLORIDE 0.9 % IV SOLN: Freq: Once | INTRAVENOUS | Status: AC

## 2023-03-07 MED ORDER — HEPARIN SOD (PORK) LOCK FLUSH 100 UNIT/ML IV SOLN
500.0000 [IU] | Freq: Once | INTRAVENOUS | Status: AC | PRN
  Administered 2023-03-07: 500 [IU]

## 2023-03-07 MED ORDER — TRASTUZUMAB-DTTB CHEMO 150 MG IV SOLR
6.0000 mg/kg | Freq: Once | INTRAVENOUS | Status: AC
  Administered 2023-03-07: 420 mg via INTRAVENOUS
  Filled 2023-03-07: qty 20

## 2023-03-07 MED ORDER — ACETAMINOPHEN 325 MG PO TABS
650.0000 mg | ORAL_TABLET | Freq: Once | ORAL | Status: AC
  Administered 2023-03-07: 650 mg via ORAL
  Filled 2023-03-07: qty 2

## 2023-03-07 NOTE — Progress Notes (Signed)
Radiation Oncology         [phone]) [phone] ________________________________  Name: Judy Morrison        MRN: [id number]  Date of Service: 03/08/2023 DOB: [birth year]  XL:[id number], [facility]  Iruku, Burnice Logan, MD     REFERRING PHYSICIAN: Rachel Moulds, MD   DIAGNOSIS: The encounter diagnosis was Malignant neoplasm of upper-outer quadrant of right breast in female, estrogen receptor positive (HCC).  HISTORY OF PRESENT ILLNESS: Judy Morrison is a 72 y.o. female originally seen in the multidisciplinary clinic for a new diagnosis of right breast cancer. The patient was noted to have a screening detected asymmetry in the left breast. She returned for diagnostic imaging which showed persistent subtle distortion in the central portion of the right breast, slightly lateral aspect of the right breast measuring 1.5 cm. A biopsy on 09/01/22 revealed a grade 2 invasive ductal carcinoma  as well as associated intermediate grade DCIS. Her cancer was ER/PR positive, HER2 amplified with a Ki-67 of 5%. She returned for an ultrasound of her axilla which did show a single borderline/mildly prominent right axillary lymph node.  She returned for a biopsy of this on 09/20/2022 which showed benign reactive lymph node felt to be concordant.  Since her last visit, the patient underwent a right lumpectomy with sentinel lymph node biopsy on 10/05/2022 and this showed a grade 2 invasive ductal carcinoma measuring 3 mm with associated DCIS compromising the rest of the 14 mm ill-defined lesion.  Her margins were clear for both invasive and in situ disease.  2 sentinel lymph nodes were submitted and both were negative for any disease.  Given her triple negative prognostics, she received adjuvant chemotherapy between 11/08/2022 and 03/07/2023.*** She's had a reduction in her ejection fraction and will meet with cardiology.    PREVIOUS RADIATION THERAPY: No   PAST MEDICAL HISTORY:  Past Medical History:   Diagnosis Date   Anemia    Anxiety    Arthritis    Depressed    Dermatitis    rare form, on prednisone   GERD (gastroesophageal reflux disease)    controlled with Omeprazole   Glaucoma    Torn rotator cuff    RIGHT    Urinary tract infection    dx 05-26-17 took 1 week cipro BID completed 06-02-17.       PAST SURGICAL HISTORY: Past Surgical History:  Procedure Laterality Date   ADENOIDECTOMY     APPENDECTOMY     arthroscopic right knee      BREAST BIOPSY Right 09/01/2022   MM RT BREAST BX W LOC DEV 1ST LESION IMAGE BX SPEC STEREO GUIDE 09/01/2022 [facility]   BREAST BIOPSY  10/01/2022   MM RT RADIOACTIVE SEED LOC MAMMO GUIDE 10/01/2022 [facility]   BREAST LUMPECTOMY WITH RADIOACTIVE SEED AND SENTINEL LYMPH NODE BIOPSY Right 10/05/2022   Procedure: RIGHT BREAST LUMPECTOMY WITH RADIOACTIVE SEED AND AXILLARY SENTINEL LYMPH NODE BIOPSY;  Surgeon: [name], MD;  Location: [facility];  Service: General;  Laterality: Right;   CHOLECYSTECTOMY     COLONOSCOPY  06/06/2017   Pyrtle   Pelvic sling     x2   POLYPECTOMY     PORT A CATH REVISION N/A 11/22/2022   Procedure: PORT A CATH REVISION;  Surgeon: [name], MD;  Location: [facility];  Service: General;  Laterality: N/A;   PORTACATH PLACEMENT Left 10/05/2022   Procedure: INSERTION PORT-A-CATH;  Surgeon: [name], MD;  Location: [facility];  Service: General;  Laterality: Left;   ROTATOR CUFF REPAIR Right    TONSILLECTOMY     TOTAL KNEE ARTHROPLASTY Right 08/12/2017   Procedure: RIGHT TOTAL KNEE ARTHROPLASTY;  Surgeon: Eugenia Mcalpine, MD;  Location: [facility];  Service: Orthopedics;  Laterality: Right;   TOTAL KNEE ARTHROPLASTY Left 01/06/2018   Procedure: LEFT TOTAL KNEE ARTHROPLASTY;  Surgeon: Eugenia Mcalpine, MD;  Location: [facility];  Service: Orthopedics;  Laterality: Left;   UPPER GASTROINTESTINAL ENDOSCOPY     WISDOM TOOTH EXTRACTION       FAMILY HISTORY:   Family History  Problem Relation Age of Onset   Bowel Disease Mother        ishemic bowel   Lung cancer Mother        smoked   Cervical cancer Paternal Aunt    Cancer Maternal Grandfather        unknown type   Lung cancer Paternal Grandfather        smoked   Colon cancer Neg Hx    Breast cancer Neg Hx    Esophageal cancer Neg Hx    Pancreatic cancer Neg Hx    Prostate cancer Neg Hx    Rectal cancer Neg Hx    Stomach cancer Neg Hx    Allergic rhinitis Neg Hx    Asthma Neg Hx    Eczema Neg Hx    Urticaria Neg Hx    Colon polyps Neg Hx      SOCIAL HISTORY:  reports that she quit smoking about 38 years ago. Her smoking use included cigarettes. She has never used smokeless tobacco. She reports current alcohol use of about 2.0 standard drinks of alcohol per week. She reports that she does not use drugs. The patient is single and lives in [place]. She ***   ALLERGIES: Penicillin g, Cephalexin, Codeine, Erythromycin, and Penicillins   MEDICATIONS:  Current Outpatient Medications  Medication Sig Dispense Refill   Adapalene 0.3 % gel Apply 1 application daily as needed topically (for acne).     ALPRAZolam (XANAX) 0.25 MG tablet Take 0.25 mg by mouth daily as needed for anxiety.   1   brexpiprazole (REXULTI) 2 MG TABS tablet Take 2 mg by mouth daily after breakfast.     clobetasol cream (TEMOVATE) 0.05 % Apply 1 Application topically 2 (two) times daily as needed (irritation).     clonazePAM (KLONOPIN) 0.5 MG tablet Take 1 mg by mouth 2 (two) times daily. Patient takes 1.0 mg AM and 0.5 mg every PM     clotrimazole (MYCELEX) 10 MG troche Take 10 mg by mouth as directed. 2 times per week     DULoxetine (CYMBALTA) 20 MG capsule Take 40 mg by mouth daily after breakfast.  1   hydrOXYzine (VISTARIL) 25 MG capsule Take 25-50 mg by mouth daily.     latanoprost (XALATAN) 0.005 % ophthalmic solution Place 1 drop into both eyes at bedtime.     levothyroxine (SYNTHROID, LEVOTHROID) 50 MCG  tablet Take 50 mcg at bedtime by mouth.      lidocaine-prilocaine (EMLA) cream Apply 1 Application to affected area topically once daily. 30 g 3   loratadine (CLARITIN) 10 MG tablet Take 10 mg by mouth daily.     nortriptyline (PAMELOR) 50 MG capsule Take 100 mg at bedtime by mouth.      omeprazole (PRILOSEC) 40 MG capsule Take 40 mg by mouth daily before breakfast.     ondansetron (ZOFRAN) 8 MG tablet Take 1 tablet (8 mg total)  by mouth every 8 (eight) hours as needed for nausea or vomiting. 30 tablet 1   OVER THE COUNTER MEDICATION 20 mLs by Mouth Rinse route 4 (four) times daily as needed (mouth pain). Oral-B Sore Mouth Rinse     prochlorperazine (COMPAZINE) 10 MG tablet Take 1 tablet (10 mg total) by mouth every 6 (six) hours as needed for nausea or vomiting. 30 tablet 1   simvastatin (ZOCOR) 10 MG tablet Take 10 mg by mouth daily.     tacrolimus (PROGRAF) 1 MG capsule Take 1 mg by mouth 2 (two) times daily. Per patient - Dissolve capsule in one half liter of H2O. Swish and spit twice daily     triamcinolone cream (KENALOG) 0.1 % Apply 1 Application topically 2 (two) times daily as needed (irritation).     valACYclovir (VALTREX) 1000 MG tablet Take 1,000 mg by mouth daily as needed (outbreaks).     No current facility-administered medications for this visit.   Facility-Administered Medications Ordered in Other Visits  Medication Dose Route Frequency Provider Last Rate Last Admin   sodium chloride flush (NS) 0.9 % injection 10 mL  10 mL Intracatheter PRN Iruku, Praveena, MD         REVIEW OF SYSTEMS: On review of systems, the patient reports she's having edema in her lower extremities. She has had fogginess of memory that she attributes to chemotherapy. No concerns about her breast are verbalized.      PHYSICAL EXAM:  Wt Readings from Last 3 Encounters:  02/22/23 173 lb 9.6 oz (78.7 kg)  02/08/23 169 lb 8 oz (76.9 kg)  01/31/23 166 lb 3.2 oz (75.4 kg)   Temp Readings from Last 3  Encounters:  02/14/23 97.8 F (36.6 C) (Oral)  02/08/23 98.3 F (36.8 C) (Oral)  01/31/23 98.3 F (36.8 C) (Oral)   BP Readings from Last 3 Encounters:  02/22/23 124/70  02/14/23 133/77  02/08/23 123/75   Pulse Readings from Last 3 Encounters:  02/22/23 97  02/14/23 95  02/08/23 90    /10  In general this is a well appearing female in no acute distress. She's alert and oriented x4 and appropriate throughout the examination. Cardiopulmonary assessment is negative for acute distress and exhibits normal effort. The right breast is examined ***    ECOG = 0  0 - Asymptomatic (Fully active, able to carry on all predisease activities without restriction)  1 - Symptomatic but completely ambulatory (Restricted in physically strenuous activity but ambulatory and able to carry out work of a light or sedentary nature. For example, light housework, office work)  2 - Symptomatic, <50% in bed during the day (Ambulatory and capable of all self care but unable to carry out any work activities. Up and about more than 50% of waking hours)  3 - Symptomatic, >50% in bed, but not bedbound (Capable of only limited self-care, confined to bed or chair 50% or more of waking hours)  4 - Bedbound (Completely disabled. Cannot carry on any self-care. Totally confined to bed or chair)  5 - Death   Santiago Glad MM, Creech RH, Tormey DC, et al. [phone]). "Toxicity and response criteria of the [facility]". Am. Evlyn Clines. Oncol. 5 (6): 649-55    LABORATORY DATA:  Lab Results  Component Value Date   WBC 3.7 (L) 02/14/2023   HGB 9.5 (L) 02/14/2023   HCT 29.2 (L) 02/14/2023   MCV 83.2 02/14/2023   PLT 340 02/14/2023   Lab Results  Component Value  Date   NA 137 02/22/2023   K 4.1 02/22/2023   CL 102 02/22/2023   CO2 27 02/22/2023   Lab Results  Component Value Date   ALT 24 02/14/2023   AST 18 02/14/2023   ALKPHOS 85 02/14/2023   BILITOT 0.4 02/14/2023      RADIOGRAPHY:  ECHOCARDIOGRAM COMPLETE  Result Date: 02/14/2023    ECHOCARDIOGRAM REPORT   Patient Name:   FANTASY DONALD Date of Exam: 02/14/2023 Medical Rec #:  [id number]           Height:       63.0 in Accession #:    [id number]          Weight:       169.5 lb Date of Birth:  06-May-1951           BSA:          1.802 m Patient Age:    71 years            BP:           122/74 mmHg Patient Gender: F                   HR:           98 bpm. Exam Location:  Outpatient Procedure: 2D Echo, Cardiac Doppler, Color Doppler and Strain Analysis Indications:    Chemo; malignant neoplasm of upper-outer quadrant of right                 breast  History:        Patient has prior history of Echocardiogram examinations, most                 recent 10/18/2022. Cancer; Risk Factors:Former Smoker.  Sonographer:    Wallie Char Referring Phys: [id number] PRAVEENA IRUKU  Sonographer Comments: Global longitudinal strain was attempted. IMPRESSIONS  1. Abnormal septal motion. Left ventricular ejection fraction, by estimation, is 50 to 55%. The left ventricle has low normal function. The left ventricle demonstrates regional wall motion abnormalities (see scoring diagram/findings for description). There is severe asymmetric left ventricular hypertrophy of the basal-septal segment. Left ventricular diastolic parameters are consistent with Grade I diastolic dysfunction (impaired relaxation). The average left ventricular global longitudinal strain is  -13.0 %. The global longitudinal strain is abnormal.  2. Right ventricular systolic function is normal. The right ventricular size is normal. There is normal pulmonary artery systolic pressure.  3. The mitral valve is grossly normal. No evidence of mitral valve regurgitation. No evidence of mitral stenosis.  4. The aortic valve was not well visualized. Aortic valve regurgitation is mild. Aortic valve sclerosis is present, with no evidence of aortic valve stenosis. Comparison(s): Prior images reviewed side by  side. Abnroaml septal motion new from prior. Consider ECG evaluation for intraventricular conduction delay. FINDINGS  Left Ventricle: Abnormal septal motion. Left ventricular ejection fraction, by estimation, is 50 to 55%. The left ventricle has low normal function. The left ventricle demonstrates regional wall motion abnormalities. The average left ventricular global longitudinal strain is -13.0 %. The global longitudinal strain is abnormal. The left ventricular internal cavity size was normal in size. There is severe asymmetric left ventricular hypertrophy of the basal-septal segment. Abnormal (paradoxical) septal motion, consistent with left bundle branch block. Left ventricular diastolic parameters are consistent with Grade I diastolic dysfunction (impaired relaxation).  LV Wall Scoring: The anterior septum and mid inferoseptal segment are hypokinetic. Right Ventricle: The right ventricular size is normal. No  increase in right ventricular wall thickness. Right ventricular systolic function is normal. There is normal pulmonary artery systolic pressure. The tricuspid regurgitant velocity is 2.45 m/s, and  with an assumed right atrial pressure of 3 mmHg, the estimated right ventricular systolic pressure is 27.0 mmHg. Left Atrium: Left atrial size was normal in size. Right Atrium: Right atrial size was normal in size. Pericardium: There is no evidence of pericardial effusion. Mitral Valve: The mitral valve is grossly normal. There is mild calcification of the mitral valve leaflet(s). Mild to moderate mitral annular calcification. No evidence of mitral valve regurgitation. No evidence of mitral valve stenosis. MV peak gradient, 6.8 mmHg. The mean mitral valve gradient is 3.0 mmHg. Tricuspid Valve: The tricuspid valve is not well visualized. Tricuspid valve regurgitation is not demonstrated. No evidence of tricuspid stenosis. Aortic Valve: The aortic valve was not well visualized. There is mild aortic valve annular  calcification. Aortic valve regurgitation is mild. Aortic regurgitation PHT measures 503 msec. Aortic valve sclerosis is present, with no evidence of aortic valve stenosis. Aortic valve mean gradient measures 7.0 mmHg. Aortic valve peak gradient measures 13.0 mmHg. Aortic valve area, by VTI measures 1.99 cm. Pulmonic Valve: The pulmonic valve was not well visualized. Pulmonic valve regurgitation is not visualized. No evidence of pulmonic stenosis. Aorta: The aortic root and ascending aorta are structurally normal, with no evidence of dilitation. IAS/Shunts: The interatrial septum was not well visualized.  LEFT VENTRICLE PLAX 2D LVIDd:         3.50 cm     Diastology LVIDs:         2.60 cm     LV e' medial:    8.36 cm/s LV PW:         1.10 cm     LV E/e' medial:  8.2 LV IVS:        1.00 cm     LV e' lateral:   7.74 cm/s LVOT diam:     1.90 cm     LV E/e' lateral: 8.9 LV SV:         59 LV SV Index:   33          2D Longitudinal Strain LVOT Area:     2.84 cm    2D Strain GLS Avg:     -13.0 %  LV Volumes (MOD) LV vol d, MOD A2C: 90.2 ml LV vol d, MOD A4C: 74.2 ml LV vol s, MOD A2C: 46.0 ml LV vol s, MOD A4C: 38.3 ml LV SV MOD A2C:     44.2 ml LV SV MOD A4C:     74.2 ml LV SV MOD BP:      44.1 ml RIGHT VENTRICLE             IVC RV Basal diam:  3.20 cm     IVC diam: 1.40 cm RV S prime:     15.80 cm/s TAPSE (M-mode): 2.1 cm LEFT ATRIUM             Index        RIGHT ATRIUM           Index LA diam:        2.50 cm 1.39 cm/m   RA Area:     14.80 cm LA Vol (A2C):   24.5 ml 13.59 ml/m  RA Volume:   38.70 ml  21.47 ml/m LA Vol (A4C):   33.0 ml 18.31 ml/m LA Biplane Vol: 30.3 ml 16.81 ml/m  AORTIC VALVE AV  Area (Vmax):    1.97 cm AV Area (Vmean):   1.90 cm AV Area (VTI):     1.99 cm AV Vmax:           180.50 cm/s AV Vmean:          122.000 cm/s AV VTI:            0.298 m AV Peak Grad:      13.0 mmHg AV Mean Grad:      7.0 mmHg LVOT Vmax:         125.50 cm/s LVOT Vmean:        81.700 cm/s LVOT VTI:          0.209 m  LVOT/AV VTI ratio: 0.70 AI PHT:            503 msec  AORTA Ao Root diam: 3.00 cm Ao Asc diam:  3.20 cm MITRAL VALVE                TRICUSPID VALVE MV Area (PHT): 6.43 cm     TR Peak grad:   24.0 mmHg MV Area VTI:   2.98 cm     TR Vmax:        245.00 cm/s MV Peak grad:  6.8 mmHg MV Mean grad:  3.0 mmHg     SHUNTS MV Vmax:       1.30 m/s     Systemic VTI:  0.21 m MV Vmean:      78.5 cm/s    Systemic Diam: 1.90 cm MV Decel Time: 118 msec MV E velocity: 68.90 cm/s MV A velocity: 125.00 cm/s MV E/A ratio:  0.55 Riley Lam MD Electronically signed by Riley Lam MD Signature Date/Time: 02/14/2023/3:47:29 PM    Final        IMPRESSION/PLAN: 1.  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 Dr. Mitzi Hansen has reviewed her final pathology findings and course to date. She and I have reviewed the nature of early stage right breast disease. She has done well since surgery and in tolerating chemotherapy. Dr. Mitzi Hansen recommends external radiotherapy to the breast  to reduce risks of local recurrence followed by antiestrogen therapy. We discussed the risks, benefits, short, and long term effects of radiotherapy, as well as the curative intent, and the patient is interested in proceeding. We reviewed the delivery and logistics of radiotherapy and Dr. Mitzi Hansen recommends 4 weeks of radiotherapy to the right breast. Written consent is obtained and placed in the chart, a copy was provided to the patient. The patient will be contacted to coordinate treatment planning by our simulation department. We would begin radiation no sooner than 04/04/23.  2. Diminished EF on Herceptin with BLE edema. ***   In a visit lasting *** minutes, greater than 50% of the time was spent face to face discussing the patient's condition, in preparation for the discussion, and coordinating the patient's care.       Osker Mason, [facility]    **Disclaimer: This note was dictated with voice  recognition software. Similar sounding words can inadvertently be transcribed and this note may contain transcription errors which may not have been corrected upon publication of note.**

## 2023-03-07 NOTE — Patient Instructions (Signed)
[facility]  Discharge Instructions: Thank you for choosing [facility] to provide your oncology and hematology care.   If you have a lab appointment with the Cancer Center, please go directly to the Cancer Center and check in at the registration area.   Wear comfortable clothing and clothing appropriate for easy access to any Portacath or PICC line.   We strive to give you quality time with your provider. You may need to reschedule your appointment if you arrive late (15 or more minutes).  Arriving late affects you and other patients whose appointments are after yours.  Also, if you miss three or more appointments without notifying the office, you may be dismissed from the clinic at the provider's discretion.      For prescription refill requests, have your pharmacy contact our office and allow 72 hours for refills to be completed.    Today you received the following chemotherapy and/or immunotherapy agents: Ontruzant      To help prevent nausea and vomiting after your treatment, we encourage you to take your nausea medication as directed.  BELOW ARE SYMPTOMS THAT SHOULD BE REPORTED IMMEDIATELY: *FEVER GREATER THAN 100.4 F (38 C) OR HIGHER *CHILLS OR SWEATING *NAUSEA AND VOMITING THAT IS NOT CONTROLLED WITH YOUR NAUSEA MEDICATION *UNUSUAL SHORTNESS OF BREATH *UNUSUAL BRUISING OR BLEEDING *URINARY PROBLEMS (pain or burning when urinating, or frequent urination) *BOWEL PROBLEMS (unusual diarrhea, constipation, pain near the anus) TENDERNESS IN MOUTH AND THROAT WITH OR WITHOUT PRESENCE OF ULCERS (sore throat, sores in mouth, or a toothache) UNUSUAL RASH, SWELLING OR PAIN  UNUSUAL VAGINAL DISCHARGE OR ITCHING   Items with * indicate a potential emergency and should be followed up as soon as possible or go to the Emergency Department if any problems should occur.  Please show the CHEMOTHERAPY ALERT CARD or IMMUNOTHERAPY ALERT CARD at  check-in to the Emergency Department and triage nurse.  Should you have questions after your visit or need to cancel or reschedule your appointment, please contact [facility]  Dept: [phone]  and follow the prompts.  Office hours are 8:00 a.m. to 4:30 p.m. Monday - Friday. Please note that voicemails left after 4:00 p.m. may not be returned until the following business day.  We are closed weekends and major holidays. You have access to a nurse at all times for urgent questions. Please call the main number to the clinic Dept: [phone] and follow the prompts.   For any non-urgent questions, you may also contact your provider using MyChart. We now offer e-Visits for anyone 18 and older to request care online for non-urgent symptoms. For details visit [email or web address].   Also download the MyChart app! Go to the app store, search "MyChart", open the app, select , and log in with your MyChart username and password.   

## 2023-03-07 NOTE — Assessment & Plan Note (Signed)
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 referred to breast [facility] for additional recommendations. On the original biopsy, the tumor measured about 10 mm.  She most recently underwent lumpectomy which showed an additional 3 mm of invasive carcinoma.  Given HER2 amplified breast cancer, despite the size especially with the larger sample removed during the biopsy, we have discussed about considering Taxol and Herceptin. She completed weekly Taxol portion and is currently on every 21-day Herceptin, tolerating it remarkably well.  She had some changes in her echo hence we temporarily held the Herceptin, send her to cardio oncology.  They recommended that we continue Herceptin and follow-up with her closely.  She today comes with her daughter, once again daughter is really worried about her balance issues and her memory issues.  Apparently she had no memory of seeing the cardiologist and she recently had a fall.  We did an MRI brain which did not show any evidence of metastatic disease.  At this time were not entirely sure of her reason for the fogginess and off balance.  I think it is a good idea to see neurology as well, I will make a referral to neurology.  She is agreeable to this.  She will otherwise proceed with Herceptin.  She will continue Herceptin for 1 year, last dose anticipated in February 2025.

## 2023-03-07 NOTE — Progress Notes (Signed)
[facility] Cancer Follow up:    Medicine, [facility] [address] [place] [address]   DIAGNOSIS:  Cancer Staging  Malignant neoplasm of upper-outer quadrant of right breast in female, estrogen receptor positive (HCC) Staging form: Breast, AJCC 8th Edition - Clinical stage from 09/15/2022: Stage IA (cT1c, cN0, cM0, G2, ER+, PR+, HER2+) - Unsigned Stage prefix: Initial diagnosis Histologic grading system: 3 grade system Laterality: Right Staged by: Pathologist and managing physician Stage used in treatment planning: Yes National guidelines used in treatment planning: Yes Type of national guideline used in treatment planning: NCCN - Pathologic stage from 10/05/2022: Stage IA (pT1c, pN0, cM0, G2, ER+, PR+, HER2+) - Signed by Loa Socks, NP on 11/08/2022 Stage prefix: Initial diagnosis Histologic grading system: 3 grade system   SUMMARY OF ONCOLOGIC HISTORY: Oncology History  Malignant neoplasm of upper-outer quadrant of right breast in female, estrogen receptor positive (HCC)  08/02/2022 Mammogram   In the right breast, possible distortion warrants further evaluation. In the left breast, no findings suspicious for malignancy. Diagnostic mammogram showed persistent subtle distortion central slightly LATERAL aspect of the RIGHT breast warranting tissue diagnosis.     09/01/2022 Pathology Results   Final pathology showed grade 2 invasive mammary carcinoma, prognostic showed ER 90% positive strong staining PR 90% positive strong staining, Ki-67 of 5% and HER2 3+ by [facility]   09/10/2022 Initial Diagnosis   Malignant neoplasm of upper-outer quadrant of right breast in female, estrogen receptor positive (HCC)    Genetic Testing   [facility] CancerNext-Expanded Panel+RNA was Positive. 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  Report date is 10/04/2022.  The CancerNext-Expanded gene panel offered by [facility] and includes sequencing, rearrangement, and RNA analysis for the following 77 genes: AIP, ALK, APC, ATM, AXIN2, BAP1, BARD1, BLM, BMPR1A, BRCA1, BRCA2, BRIP1, CDC73, CDH1, CDK4, CDKN1B, CDKN2A, CHEK2, CTNNA1, DICER1, FANCC, FH, FLCN, GALNT12, KIF1B, LZTR1, MAX, MEN1, MET, MLH1, MSH2, MSH3, MSH6, MUTYH, NBN, NF1, NF2, NTHL1, PALB2, PHOX2B, PMS2, POT1, PRKAR1A, PTCH1, PTEN, RAD51C, RAD51D, RB1, RECQL, RET, SDHA, SDHAF2, SDHB, SDHC, SDHD, SMAD4, SMARCA4, SMARCB1, SMARCE1, STK11, SUFU, TMEM127, TP53, TSC1, TSC2, VHL and XRCC2 (sequencing and deletion/duplication); EGFR, EGLN1, HOXB13, KIT, MITF, PDGFRA, POLD1, and POLE (sequencing only); EPCAM and GREM1 (deletion/duplication only).    10/05/2022 Surgery   Right breast lumpectomy with Dr. 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  Grade 2, 2 sentinel lymph nodes negative for metastasis.  Margins negative.  T1c, N0.   10/05/2022 Cancer Staging   Staging form: Breast, AJCC 8th Edition - Pathologic stage from 10/05/2022: Stage IA (pT1c, pN0, cM0, G2, ER+, PR+, HER2+) - Signed by Loa Socks, NP on 11/08/2022 Stage prefix: Initial diagnosis Histologic grading system: 3 grade system   11/08/2022 -  Chemotherapy   Patient is on Treatment Plan : BREAST Paclitaxel + Trastuzumab q7d / Trastuzumab q21d       CURRENT THERAPY: Taxol/Herceptin  INTERVAL HISTORY: Judy Morrison 72 y.o. female returns for follow-up and evaluation prior to her Herceptin.  We held Herceptin because of changes in echo.  She has seen Dr. Gillis Ends since then and his recommendation is to continue Herceptin and reevaluate in 6 weeks. She today comes with her daughter.  Her daughter is really worried that she has been quite off balance, has no memory of seeing a cardiologist and is very  foggy all the time.  She recently had a fall because of her off balance.   She makes a meaningful conversation for most of the time here.  She denies any complaints with the treatment otherwise.  She did report some leg swelling to her radiation oncology team which is quite better today.  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   Malignant neoplasm of upper-outer quadrant of right breast in female, estrogen receptor positive (HCC) 09/10/2022   Rash and other nonspecific skin eruption 09/15/2020   Allergic contact dermatitis 09/15/2020   Pruritus 08/14/2020   Dizziness 05/10/2018   Atypical chest pain 05/10/2018   Primary osteoarthritis of left knee 01/06/2018   S/P knee replacement 08/12/2017   Incomplete tear of right rotator cuff 05/19/2017   Osteopenia 05/19/2017   Urge incontinence of urine 05/19/2017   Primary open angle glaucoma (POAG) 12/30/2016   Primary osteoarthritis of both knees 12/30/2016   Recurrent oral herpes simplex infection 12/30/2016   Tubular adenoma of colon 12/30/2016   Hypothyroidism 06/25/2016   Right foot pain 12/19/2014   Anxiety 05/21/2011   Seasonal allergies 05/21/2011    is allergic to penicillin g, cephalexin, codeine, erythromycin, and penicillins.  MEDICAL HISTORY: Past Medical History:  Diagnosis Date   Anemia    Anxiety    Arthritis    Depressed    Dermatitis    rare form, on prednisone   GERD (gastroesophageal reflux disease)    controlled with Omeprazole   Glaucoma    Torn rotator cuff    RIGHT    Urinary tract infection    dx 05-26-17 took 1 week cipro BID completed 06-02-17.    SURGICAL HISTORY: Past Surgical History:  Procedure Laterality Date   ADENOIDECTOMY     APPENDECTOMY     arthroscopic right knee      BREAST BIOPSY Right 09/01/2022   MM RT BREAST BX W LOC DEV 1ST LESION IMAGE BX SPEC STEREO GUIDE 09/01/2022 [facility]  MAMMOGRAPHY   BREAST BIOPSY  10/01/2022   MM RT RADIOACTIVE SEED LOC MAMMO GUIDE 10/01/2022 [facility] MAMMOGRAPHY   BREAST LUMPECTOMY WITH RADIOACTIVE SEED AND SENTINEL LYMPH NODE BIOPSY Right 10/05/2022   Procedure: RIGHT BREAST LUMPECTOMY WITH RADIOACTIVE SEED AND AXILLARY SENTINEL LYMPH NODE BIOPSY;  Surgeon: [name], MD;  Location: [facility];  Service: General;  Laterality: Right;   CHOLECYSTECTOMY     COLONOSCOPY  06/06/2017   Pyrtle   Pelvic sling     x2   POLYPECTOMY     PORT A CATH REVISION N/A 11/22/2022   Procedure: PORT A CATH REVISION;  Surgeon: [name], MD;  Location: [facility];  Service: General;  Laterality: N/A;   PORTACATH PLACEMENT Left 10/05/2022   Procedure: INSERTION PORT-A-CATH;  Surgeon: [name], MD;  Location: [facility];  Service: General;  Laterality: Left;   ROTATOR CUFF REPAIR Right    TONSILLECTOMY     TOTAL KNEE ARTHROPLASTY Right 08/12/2017   Procedure: RIGHT TOTAL KNEE ARTHROPLASTY;  Surgeon: Eugenia Mcalpine, MD;  Location: [facility];  Service: Orthopedics;  Laterality: Right;   TOTAL KNEE ARTHROPLASTY Left 01/06/2018   Procedure: LEFT TOTAL KNEE ARTHROPLASTY;  Surgeon: Eugenia Mcalpine, MD;  Location: [facility];  Service: Orthopedics;  Laterality: Left;   UPPER GASTROINTESTINAL ENDOSCOPY     WISDOM  TOOTH EXTRACTION      SOCIAL HISTORY: Social History   Socioeconomic History   Marital status: Single    Spouse name: Not on file   Number of children: Not on file   Years of education: Not on file   Highest education level: Not on file  Occupational History   Not on file  Tobacco Use   Smoking status: Former    Types: Cigarettes    Quit date: 57    Years since quitting: 38.5   Smokeless tobacco: Never  Vaping Use   Vaping Use: Never used  Substance and Sexual Activity   Alcohol use: Yes    Alcohol/week: 2.0 standard drinks of alcohol    Types: 2 Cans of beer per week   Drug use: No   Sexual  activity: Not on file  Other Topics Concern   Not on file  Social History Narrative   Not on file   Social Determinants of Health   Financial Resource Strain: Not on file  Food Insecurity: No Food Insecurity (09/15/2022)   Hunger Vital Sign    Worried About Running Out of Food in the Last Year: Never true    Ran Out of Food in the Last Year: Never true  Transportation Needs: No Transportation Needs (09/15/2022)   PRAPARE - Administrator, Civil Service (Medical): No    Lack of Transportation (Non-Medical): No  Physical Activity: Not on file  Stress: Not on file  Social Connections: Not on file  Intimate Partner Violence: Not on file    FAMILY HISTORY: Family History  Problem Relation Age of Onset   Bowel Disease Mother        ishemic bowel   Lung cancer Mother        smoked   Cervical cancer Paternal Aunt    Cancer Maternal Grandfather        unknown type   Lung cancer Paternal Grandfather        smoked   Colon cancer Neg Hx    Breast cancer Neg Hx    Esophageal cancer Neg Hx    Pancreatic cancer Neg Hx    Prostate cancer Neg Hx    Rectal cancer Neg Hx    Stomach cancer Neg Hx    Allergic rhinitis Neg Hx    Asthma Neg Hx    Eczema Neg Hx    Urticaria Neg Hx    Colon polyps Neg Hx     Review of Systems  Constitutional:  Negative for appetite change, chills, fatigue, fever and unexpected weight change.  HENT:   Negative for hearing loss, lump/mass and trouble swallowing.   Eyes:  Negative for eye problems and icterus.  Respiratory:  Positive for cough. Negative for chest tightness and shortness of breath.   Cardiovascular:  Negative for chest pain, leg swelling and palpitations.  Gastrointestinal:  Negative for abdominal distention, abdominal pain, constipation, diarrhea, nausea and vomiting.  Endocrine: Negative for hot flashes.  Genitourinary:  Negative for difficulty urinating.   Musculoskeletal:  Negative for arthralgias and gait problem.  Skin:   Negative for itching and rash.  Neurological:  Negative for dizziness, extremity weakness, gait problem, headaches and numbness.  Hematological:  Negative for adenopathy. Does not bruise/bleed easily.  Psychiatric/Behavioral:  Negative for depression. The patient is not nervous/anxious.       PHYSICAL EXAMINATION     Vitals:   03/07/23 1204  BP: 137/77  Pulse: 96  Resp: 16  Temp: 98.1 F (  36.7 C)  SpO2: 94%    Physical Exam Constitutional:      General: She is not in acute distress.    Appearance: Normal appearance. She is not toxic-appearing.  HENT:     Head: Normocephalic and atraumatic.     Mouth/Throat:     Mouth: Mucous membranes are moist.     Pharynx: Oropharynx is clear. No oropharyngeal exudate or posterior oropharyngeal erythema.  Eyes:     General: No scleral icterus. Cardiovascular:     Rate and Rhythm: Normal rate and regular rhythm.     Pulses: Normal pulses.     Heart sounds: Normal heart sounds.  Pulmonary:     Effort: Pulmonary effort is normal.     Breath sounds: Normal breath sounds.  Abdominal:     General: Abdomen is flat. Bowel sounds are normal. There is no distension.     Palpations: Abdomen is soft.     Tenderness: There is no abdominal tenderness.  Musculoskeletal:        General: Swelling (mild) present.     Cervical back: Neck supple.  Lymphadenopathy:     Cervical: No cervical adenopathy.  Skin:    General: Skin is warm and dry.     Findings: No rash.  Neurological:     General: No focal deficit present.     Mental Status: She is alert and oriented to person, place, and time.     Cranial Nerves: No cranial nerve deficit.     Motor: No weakness.     Coordination: Coordination normal.  Psychiatric:        Mood and Affect: Mood normal.        Behavior: Behavior normal.     LABORATORY DATA:  CBC    Component Value Date/Time   WBC 7.7 03/07/2023 1131   WBC 10.1 10/01/2021 1314   RBC 3.75 (L) 03/07/2023 1131   HGB 9.9 (L)  03/07/2023 1131   HGB 13.7 09/15/2020 1209   HCT 31.0 (L) 03/07/2023 1131   HCT 41.3 09/15/2020 1209   PLT 334 03/07/2023 1131   PLT 349 09/15/2020 1209   MCV 82.7 03/07/2023 1131   MCV 86 09/15/2020 1209   MCH 26.4 03/07/2023 1131   MCHC 31.9 03/07/2023 1131   RDW 17.5 (H) 03/07/2023 1131   RDW 12.3 09/15/2020 1209   LYMPHSABS 1.3 03/07/2023 1131   LYMPHSABS 1.2 09/15/2020 1209   MONOABS 0.9 03/07/2023 1131   EOSABS 0.4 03/07/2023 1131   EOSABS 0.2 09/15/2020 1209   BASOSABS 0.1 03/07/2023 1131   BASOSABS 0.1 09/15/2020 1209    CMP     Component Value Date/Time   NA 136 03/07/2023 1131   NA 135 09/15/2020 1209   K 4.3 03/07/2023 1131   CL 102 03/07/2023 1131   CO2 28 03/07/2023 1131   GLUCOSE 89 03/07/2023 1131   BUN 8 03/07/2023 1131   BUN 14 09/15/2020 1209   CREATININE 0.84 03/07/2023 1131   CALCIUM 8.9 03/07/2023 1131   PROT 6.2 (L) 03/07/2023 1131   PROT 6.8 09/15/2020 1209   ALBUMIN 3.9 03/07/2023 1131   ALBUMIN 4.6 09/15/2020 1209   AST 16 03/07/2023 1131   ALT 12 03/07/2023 1131   ALKPHOS 103 03/07/2023 1131   BILITOT 0.3 03/07/2023 1131   GFRNONAA >60 03/07/2023 1131   GFRAA 74 09/15/2020 1209        ASSESSMENT and THERAPY PLAN:   Malignant neoplasm of upper-outer quadrant of right breast in female, estrogen receptor positive (  HCC) This is a very pleasant 72 year old postmenopausal patient with newly diagnosed right breast IDC, ER and PR positive, HER2 positive by IHC, Ki 67 of 5%? referred to breast [facility] for additional recommendations. On the original biopsy, the tumor measured about 10 mm.  She most recently underwent lumpectomy which showed an additional 3 mm of invasive carcinoma.  Given HER2 amplified breast cancer, despite the size especially with the larger sample removed during the biopsy, we have discussed about considering Taxol and Herceptin. She completed weekly Taxol portion and is currently on every 21-day Herceptin, tolerating it  remarkably well.  She had some changes in her echo hence we temporarily held the Herceptin, send her to cardio oncology.  They recommended that we continue Herceptin and follow-up with her closely.  She today comes with her daughter, once again daughter is really worried about her balance issues and her memory issues.  Apparently she had no memory of seeing the cardiologist and she recently had a fall.  We did an MRI brain which did not show any evidence of metastatic disease.  At this time were not entirely sure of her reason for the fogginess and off balance.  I think it is a good idea to see neurology as well, I will make a referral to neurology.  She is agreeable to this.  She will otherwise proceed with Herceptin.  She will continue Herceptin for 1 year, last dose anticipated in February 2025.    All questions were answered. The patient knows to call the clinic with any problems, questions or concerns. We can certainly see the patient much sooner if necessary.  Total encounter time:30 minutes*in face-to-face visit time, chart review, lab review, care coordination, order entry, and documentation of the encounter time.   *Total Encounter Time as defined by the Centers for Medicare and Medicaid Services includes, in addition to the face-to-face time of a patient visit (documented in the note above) non-face-to-face time: obtaining and reviewing outside history, ordering and reviewing medications, tests or procedures, care coordination (communications with other health care professionals or caregivers) and documentation in the medical record.

## 2023-03-08 ENCOUNTER — Ambulatory Visit
Admission: RE | Admit: 2023-03-08 | Discharge: 2023-03-08 | Disposition: A | Discharge status: Home / Self Care | Payer: Medicare Other | Source: Ambulatory Visit | Attending: Radiation Oncology | Admitting: Radiation Oncology

## 2023-03-08 ENCOUNTER — Encounter: Payer: Self-pay | Admitting: Radiation Oncology

## 2023-03-08 ENCOUNTER — Other Ambulatory Visit: Payer: Self-pay

## 2023-03-08 ENCOUNTER — Ambulatory Visit: Payer: Medicare Other | Admitting: Radiation Oncology

## 2023-03-08 VITALS — BP 120/71 | HR 94 | Temp 97.3°F | Resp 20 | Ht 63.0 in | Wt 170.2 lb

## 2023-03-08 DIAGNOSIS — C50411 Malignant neoplasm of upper-outer quadrant of right female breast: Secondary | ICD-10-CM

## 2023-03-08 DIAGNOSIS — Z170 Estrogen receptor positive status [ER+]: Secondary | ICD-10-CM

## 2023-03-08 NOTE — Progress Notes (Signed)
Nursing interview for Malignant neoplasm of upper-outer quadrant of right breast in female, estrogen receptor positive (HCC).    Patient identity verified x2.  Patient reports doing well. No issues conveyed at this time.  Meaningful use complete.  Vitals- BP 120/71 (BP Location: Left Arm, Patient Position: Sitting, Cuff Size: Large)   Pulse 94   Temp (!) 97.3 F (36.3 C) (Temporal)   Resp 20   Ht 5\' 3"  (1.6 m)   Wt 170 lb 3.2 oz (77.2 kg)   SpO2 95%   BMI 30.15 kg/m    This concludes the interaction.  Ruel Favors, LPN

## 2023-03-09 ENCOUNTER — Ambulatory Visit: Admission: RE | Admit: 2023-03-09 | Payer: Medicare Other | Source: Ambulatory Visit | Admitting: Radiation Oncology

## 2023-03-09 DIAGNOSIS — Z170 Estrogen receptor positive status [ER+]: Secondary | ICD-10-CM | POA: Diagnosis not present

## 2023-03-09 DIAGNOSIS — C50411 Malignant neoplasm of upper-outer quadrant of right female breast: Secondary | ICD-10-CM | POA: Diagnosis not present

## 2023-03-10 ENCOUNTER — Other Ambulatory Visit: Payer: Self-pay

## 2023-03-14 ENCOUNTER — Encounter: Payer: Self-pay | Admitting: *Deleted

## 2023-03-21 ENCOUNTER — Telehealth: Payer: Self-pay | Admitting: Hematology and Oncology

## 2023-03-21 ENCOUNTER — Ambulatory Visit: Payer: Medicare Other | Admitting: Adult Health

## 2023-03-21 ENCOUNTER — Ambulatory Visit: Payer: Medicare Other

## 2023-03-21 NOTE — Telephone Encounter (Signed)
Scheduled appointments per WQ. Left voicemail for patient.  

## 2023-03-22 ENCOUNTER — Other Ambulatory Visit: Payer: Self-pay

## 2023-03-22 ENCOUNTER — Ambulatory Visit
Admission: RE | Admit: 2023-03-22 | Discharge: 2023-03-22 | Disposition: A | Discharge status: Home / Self Care | Payer: Medicare Other | Source: Ambulatory Visit | Attending: Radiation Oncology | Admitting: Radiation Oncology

## 2023-03-22 DIAGNOSIS — Z5181 Encounter for therapeutic drug level monitoring: Secondary | ICD-10-CM | POA: Diagnosis not present

## 2023-03-22 DIAGNOSIS — Z79899 Other long term (current) drug therapy: Secondary | ICD-10-CM | POA: Insufficient documentation

## 2023-03-22 DIAGNOSIS — Z87891 Personal history of nicotine dependence: Secondary | ICD-10-CM | POA: Diagnosis not present

## 2023-03-22 DIAGNOSIS — Z170 Estrogen receptor positive status [ER+]: Secondary | ICD-10-CM | POA: Insufficient documentation

## 2023-03-22 DIAGNOSIS — C50411 Malignant neoplasm of upper-outer quadrant of right female breast: Secondary | ICD-10-CM | POA: Insufficient documentation

## 2023-03-22 DIAGNOSIS — Z5112 Encounter for antineoplastic immunotherapy: Secondary | ICD-10-CM | POA: Insufficient documentation

## 2023-03-23 ENCOUNTER — Other Ambulatory Visit: Payer: Self-pay

## 2023-03-23 DIAGNOSIS — Z5112 Encounter for antineoplastic immunotherapy: Secondary | ICD-10-CM | POA: Diagnosis not present

## 2023-03-23 LAB — RAD ONC ARIA SESSION SUMMARY
Course Elapsed Days: 0
Plan Fractions Treated to Date: 1
Plan Prescribed Dose Per Fraction: 2.66 Gy
Plan Total Fractions Prescribed: 16
Plan Total Prescribed Dose: 42.56 Gy
Reference Point Dosage Given to Date: 2.66 Gy
Reference Point Session Dosage Given: 2.66 Gy
Session Number: 1

## 2023-03-24 ENCOUNTER — Other Ambulatory Visit: Payer: Self-pay

## 2023-03-24 ENCOUNTER — Ambulatory Visit
Admission: RE | Admit: 2023-03-24 | Discharge: 2023-03-24 | Disposition: A | Discharge status: Home / Self Care | Payer: Medicare Other | Source: Ambulatory Visit | Attending: Radiation Oncology | Admitting: Radiation Oncology

## 2023-03-24 DIAGNOSIS — Z5112 Encounter for antineoplastic immunotherapy: Secondary | ICD-10-CM | POA: Diagnosis not present

## 2023-03-24 LAB — RAD ONC ARIA SESSION SUMMARY
Course Elapsed Days: 1
Plan Fractions Treated to Date: 2
Plan Prescribed Dose Per Fraction: 2.66 Gy
Plan Total Fractions Prescribed: 16
Plan Total Prescribed Dose: 42.56 Gy
Reference Point Dosage Given to Date: 5.32 Gy
Reference Point Session Dosage Given: 2.66 Gy
Session Number: 2

## 2023-03-25 ENCOUNTER — Ambulatory Visit: Payer: Medicare Other | Admitting: Radiation Oncology

## 2023-03-25 ENCOUNTER — Other Ambulatory Visit: Payer: Self-pay

## 2023-03-25 ENCOUNTER — Ambulatory Visit
Admission: RE | Admit: 2023-03-25 | Discharge: 2023-03-25 | Disposition: A | Discharge status: Home / Self Care | Payer: Medicare Other | Source: Ambulatory Visit | Attending: Radiation Oncology | Admitting: Radiation Oncology

## 2023-03-25 DIAGNOSIS — Z5112 Encounter for antineoplastic immunotherapy: Secondary | ICD-10-CM | POA: Diagnosis not present

## 2023-03-25 DIAGNOSIS — C50411 Malignant neoplasm of upper-outer quadrant of right female breast: Secondary | ICD-10-CM

## 2023-03-25 LAB — RAD ONC ARIA SESSION SUMMARY
Course Elapsed Days: 2
Plan Fractions Treated to Date: 3
Plan Prescribed Dose Per Fraction: 2.66 Gy
Plan Total Fractions Prescribed: 16
Plan Total Prescribed Dose: 42.56 Gy
Reference Point Dosage Given to Date: 7.98 Gy
Reference Point Session Dosage Given: 2.66 Gy
Session Number: 3

## 2023-03-25 MED ORDER — ALRA NON-METALLIC DEODORANT (RAD-ONC)
1.0000 | Freq: Once | TOPICAL | Status: AC
  Administered 2023-03-25: 1 via TOPICAL

## 2023-03-25 MED ORDER — RADIAPLEXRX EX GEL: Freq: Once | CUTANEOUS | Status: AC

## 2023-03-28 ENCOUNTER — Inpatient Hospital Stay: Payer: Medicare Other

## 2023-03-28 ENCOUNTER — Ambulatory Visit
Admission: RE | Admit: 2023-03-28 | Discharge: 2023-03-28 | Disposition: A | Discharge status: Home / Self Care | Payer: Medicare Other | Source: Ambulatory Visit | Attending: Radiation Oncology | Admitting: Radiation Oncology

## 2023-03-28 ENCOUNTER — Other Ambulatory Visit: Payer: Self-pay

## 2023-03-28 ENCOUNTER — Encounter: Payer: Self-pay | Admitting: Adult Health

## 2023-03-28 ENCOUNTER — Inpatient Hospital Stay: Payer: Medicare Other | Attending: Hematology and Oncology | Admitting: Adult Health

## 2023-03-28 VITALS — BP 136/76 | HR 81 | Resp 19

## 2023-03-28 DIAGNOSIS — Z170 Estrogen receptor positive status [ER+]: Secondary | ICD-10-CM

## 2023-03-28 DIAGNOSIS — C50411 Malignant neoplasm of upper-outer quadrant of right female breast: Secondary | ICD-10-CM | POA: Diagnosis not present

## 2023-03-28 DIAGNOSIS — Z5112 Encounter for antineoplastic immunotherapy: Secondary | ICD-10-CM | POA: Diagnosis not present

## 2023-03-28 DIAGNOSIS — Z95828 Presence of other vascular implants and grafts: Secondary | ICD-10-CM

## 2023-03-28 DIAGNOSIS — Z87891 Personal history of nicotine dependence: Secondary | ICD-10-CM | POA: Insufficient documentation

## 2023-03-28 LAB — CMP (CANCER CENTER ONLY)
ALT: 15 U/L (ref 0–44)
AST: 16 U/L (ref 15–41)
Albumin: 4 g/dL (ref 3.5–5.0)
Alkaline Phosphatase: 94 U/L (ref 38–126)
Anion gap: 6 (ref 5–15)
BUN: 14 mg/dL (ref 8–23)
CO2: 28 mmol/L (ref 22–32)
Calcium: 9.2 mg/dL (ref 8.9–10.3)
Chloride: 104 mmol/L (ref 98–111)
Creatinine: 0.85 mg/dL (ref 0.44–1.00)
GFR, Estimated: >60 mL/min (ref >60)
Glucose, Bld: 83 mg/dL (ref 70–99)
Potassium: 3.9 mmol/L (ref 3.5–5.1)
Sodium: 138 mmol/L (ref 135–145)
Total Bilirubin: 0.4 mg/dL (ref 0.3–1.2)
Total Protein: 6.6 g/dL (ref 6.5–8.1)

## 2023-03-28 LAB — RAD ONC ARIA SESSION SUMMARY
Course Elapsed Days: 5
Plan Fractions Treated to Date: 4
Plan Prescribed Dose Per Fraction: 2.66 Gy
Plan Total Fractions Prescribed: 16
Plan Total Prescribed Dose: 42.56 Gy
Reference Point Dosage Given to Date: 10.64 Gy
Reference Point Session Dosage Given: 2.66 Gy
Session Number: 4

## 2023-03-28 LAB — CBC WITH DIFFERENTIAL (CANCER CENTER ONLY)
Abs Immature Granulocytes: 0.01 10*3/uL (ref 0.00–0.07)
Basophils Absolute: 0.1 10*3/uL (ref 0.0–0.1)
Basophils Relative: 1 %
Eosinophils Absolute: 0.3 10*3/uL (ref 0.0–0.5)
Eosinophils Relative: 5 %
HCT: 30.9 % — ABNORMAL LOW (ref 36.0–46.0)
Hemoglobin: 10.1 g/dL — ABNORMAL LOW (ref 12.0–15.0)
Immature Granulocytes: 0 %
Lymphocytes Relative: 19 %
Lymphs Abs: 1.2 10*3/uL (ref 0.7–4.0)
MCH: 26.4 pg (ref 26.0–34.0)
MCHC: 32.7 g/dL (ref 30.0–36.0)
MCV: 80.7 fL (ref 80.0–100.0)
Monocytes Absolute: 0.8 10*3/uL (ref 0.1–1.0)
Monocytes Relative: 12 %
Neutro Abs: 4 10*3/uL (ref 1.7–7.7)
Neutrophils Relative %: 63 %
Platelet Count: 325 10*3/uL (ref 150–400)
RBC: 3.83 MIL/uL — ABNORMAL LOW (ref 3.87–5.11)
RDW: 16.2 % — ABNORMAL HIGH (ref 11.5–15.5)
WBC Count: 6.4 10*3/uL (ref 4.0–10.5)
nRBC: 0 % (ref 0.0–0.2)

## 2023-03-28 MED ORDER — HEPARIN SOD (PORK) LOCK FLUSH 100 UNIT/ML IV SOLN
500.0000 [IU] | Freq: Once | INTRAVENOUS | Status: AC | PRN
  Administered 2023-03-28: 500 [IU]

## 2023-03-28 MED ORDER — DIPHENHYDRAMINE HCL 25 MG PO CAPS
50.0000 mg | ORAL_CAPSULE | Freq: Once | ORAL | Status: AC
  Administered 2023-03-28: 50 mg via ORAL
  Filled 2023-03-28: qty 2

## 2023-03-28 MED ORDER — SODIUM CHLORIDE 0.9 % IV SOLN: Freq: Once | INTRAVENOUS | Status: AC

## 2023-03-28 MED ORDER — SODIUM CHLORIDE 0.9% FLUSH
10.0000 mL | Freq: Once | INTRAVENOUS | Status: AC
  Administered 2023-03-28: 10 mL

## 2023-03-28 MED ORDER — TRASTUZUMAB-DTTB CHEMO 150 MG IV SOLR
6.0000 mg/kg | Freq: Once | INTRAVENOUS | Status: AC
  Administered 2023-03-28: 420 mg via INTRAVENOUS
  Filled 2023-03-28: qty 20

## 2023-03-28 MED ORDER — SODIUM CHLORIDE 0.9% FLUSH
10.0000 mL | INTRAVENOUS | Status: DC | PRN
  Administered 2023-03-28: 10 mL

## 2023-03-28 MED ORDER — ACETAMINOPHEN 325 MG PO TABS
650.0000 mg | ORAL_TABLET | Freq: Once | ORAL | Status: AC
  Administered 2023-03-28: 650 mg via ORAL
  Filled 2023-03-28: qty 2

## 2023-03-28 NOTE — Assessment & Plan Note (Signed)
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  Treatment Plan:  Breast conserving surgery with sentinel node biopsy Adjuvant chemotherapy with Taxol/Herceptin followed by Herceptin maintenance Adjuvant radiation therapy Adjuvant antiestrogen therapy __________________________________________ Current treatment: Herceptin/Adjuvant radiation  Judy Morrison will proceed with every 3-week Herceptin.  Her labs are stable and we reviewed them in detail.  At risk for heart failure: Her most recent echo was low normal at 50 to 55%.  She is undergoing repeat echocardiogram later this month with the heart failure clinic. She will continue with adjuvant radiation therapy.  She is scheduled to receive this through the beginning of August, 2024.  RTC every 3 weeks for f/u and Herceptin.

## 2023-03-28 NOTE — Research (Signed)
TRIAL S2205, ICE COMPRESS: RANDOMIZED TRIAL OF LIMB CRYOCOMPRESSION VERSUS CONTINUOUS COMPRESSION VERSUS LOW CYCLIC COMPRESSION FOR THE PREVENTION  OF TAXANE-INDUCED PERIPHERAL NEUROPATHY   Patient arrives today Accompanied by her daughter  for the 24 Week visit.    PROs: Per study protocol, all PROs required for this visit were completed prior to other study activities and completeness has been verified.     MD/PROVIDER VISIT: Patient sees NP Lillard Anes for today's visit.   ADVERSE EVENTS: Patient Lianah Peed Reddix reports AE as below. Attribution per Dr Al Pimple.  ADVERSE EVENT LOGMAUREEN DELATTE [id number]  03/28/2023  Adverse Event Log  Study/Protocol: W0981 Cycle: Week 24  Event Grade Onset Date Resolved Date Drug Name Attribution Treatment Comments  Fatigue Grade 1 03/28/23   Unrelated                                                   NEURO ASSESSMENT: The neuro assessment was completed by this nurse. Patient Jalayia Bagheri Fauver tolerated all testing without complaint.   GIFT CARD: This study does not provide visit compensation.   DISPOSITION: Upon completion off all study requirements, patient remained in the exam room waiting for the NP visit.   The patient was thanked for their time and continued voluntary participation in this study. Patient KAMORA VOSSLER has been provided direct contact information and is encouraged to contact this Nurse for any needs or questions. Reminded her that her next visit will be the 52 week visit at the beginning of next year; she verbalized understanding.  Margret Chance Sherae Santino, RN, BSN, [facility] She  Her  Hers Clinical Research Nurse [facility] Direct Dial [phone]  Pager [phone] 03/28/2023 3:21 PM

## 2023-03-28 NOTE — Progress Notes (Signed)
[facility] Cancer Follow up:    Medicine, [facility] [address] Vella Raring [place] [address]   DIAGNOSIS:  Cancer Staging  Malignant neoplasm of upper-outer quadrant of right breast in female, estrogen receptor positive (HCC) Staging form: Breast, AJCC 8th Edition - Clinical stage from 09/15/2022: Stage IA (cT1c, cN0, cM0, G2, ER+, PR+, HER2+) - Unsigned Stage prefix: Initial diagnosis Histologic grading system: 3 grade system Laterality: Right Staged by: Pathologist and managing physician Stage used in treatment planning: Yes National guidelines used in treatment planning: Yes Type of national guideline used in treatment planning: NCCN - Pathologic stage from 10/05/2022: Stage IA (pT1c, pN0, cM0, G2, ER+, PR+, HER2+) - Signed by Loa Socks, NP on 11/08/2022 Stage prefix: Initial diagnosis Histologic grading system: 3 grade system   SUMMARY OF ONCOLOGIC HISTORY: Oncology History  Malignant neoplasm of upper-outer quadrant of right breast in female, estrogen receptor positive (HCC)  08/02/2022 Mammogram   In the right breast, possible distortion warrants further evaluation. In the left breast, no findings suspicious for malignancy. Diagnostic mammogram showed persistent subtle distortion central slightly LATERAL aspect of the RIGHT breast warranting tissue diagnosis.     09/01/2022 Pathology Results   Final pathology showed grade 2 invasive mammary carcinoma, prognostic showed ER 90% positive strong staining PR 90% positive strong staining, Ki-67 of 5% and HER2 3+ by [facility]   09/10/2022 Initial Diagnosis   Malignant neoplasm of upper-outer quadrant of right breast in female, estrogen receptor positive (HCC)    Genetic Testing   [facility] CancerNext-Expanded Panel+RNA was Positive. 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  Report date is 10/04/2022.  The CancerNext-Expanded gene panel offered by [facility] and includes sequencing, rearrangement, and RNA analysis for the following 77 genes: AIP, ALK, APC, ATM, AXIN2, BAP1, BARD1, BLM, BMPR1A, BRCA1, BRCA2, BRIP1, CDC73, CDH1, CDK4, CDKN1B, CDKN2A, CHEK2, CTNNA1, DICER1, FANCC, FH, FLCN, GALNT12, KIF1B, LZTR1, MAX, MEN1, MET, MLH1, MSH2, MSH3, MSH6, MUTYH, NBN, NF1, NF2, NTHL1, PALB2, PHOX2B, PMS2, POT1, PRKAR1A, PTCH1, PTEN, RAD51C, RAD51D, RB1, RECQL, RET, SDHA, SDHAF2, SDHB, SDHC, SDHD, SMAD4, SMARCA4, SMARCB1, SMARCE1, STK11, SUFU, TMEM127, TP53, TSC1, TSC2, VHL and XRCC2 (sequencing and deletion/duplication); EGFR, EGLN1, HOXB13, KIT, MITF, PDGFRA, POLD1, and POLE (sequencing only); EPCAM and GREM1 (deletion/duplication only).    10/05/2022 Surgery   Right breast lumpectomy with Dr. 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  Grade 2, 2 sentinel lymph nodes negative for metastasis.  Margins negative.  T1c, N0.   10/05/2022 Cancer Staging   Staging form: Breast, AJCC 8th Edition - Pathologic stage from 10/05/2022: Stage IA (pT1c, pN0, cM0, G2, ER+, PR+, HER2+) - Signed by Loa Socks, NP on 11/08/2022 Stage prefix: Initial diagnosis Histologic grading system: 3 grade system   11/08/2022 -  Chemotherapy   Patient is on Treatment Plan : BREAST Paclitaxel + Trastuzumab q7d / Trastuzumab q21d     03/23/2023 - 04/19/2023 Radiation Therapy   Adjuvant radiation     CURRENT THERAPY: Herceptin every 3 weeks; adjuvant radiation therapy  INTERVAL HISTORY: Judy Morrison 72 y.o. female returns for f/u prior to receiving herceptin therapy.  Her most recent echo demonstrated low normal EF and she was seen by our heart failure clinic, Dr. Gasper Lloyd on 02/22/2023.  Her next echo is scheduled for 04/05/2023.  She tells me that she is tolerating Herceptin well.  She  does experience some fatigue.  She is managing this with energy  conservation.   Patient Active Problem List   Diagnosis Date Noted   Port-A-Cath in place 11/15/2022   Genetic testing 10/04/2022   Monoallelic mutation of ATM gene 11/91/4782   Malignant neoplasm of upper-outer quadrant of right breast in female, estrogen receptor positive (HCC) 09/10/2022   Rash and other nonspecific skin eruption 09/15/2020   Allergic contact dermatitis 09/15/2020   Pruritus 08/14/2020   Dizziness 05/10/2018   Atypical chest pain 05/10/2018   Primary osteoarthritis of left knee 01/06/2018   S/P knee replacement 08/12/2017   Incomplete tear of right rotator cuff 05/19/2017   Osteopenia 05/19/2017   Urge incontinence of urine 05/19/2017   Primary open angle glaucoma (POAG) 12/30/2016   Primary osteoarthritis of both knees 12/30/2016   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  MEDICAL HISTORY: Past Medical History:  Diagnosis Date   Anemia    Anxiety    Arthritis    Depressed    Dermatitis    rare form, on prednisone   GERD (gastroesophageal reflux disease)    controlled with Omeprazole   Glaucoma    Torn rotator cuff    RIGHT    Urinary tract infection    dx 05-26-17 took 1 week cipro BID completed 06-02-17.    SURGICAL HISTORY: Past Surgical History:  Procedure Laterality Date   ADENOIDECTOMY     APPENDECTOMY     arthroscopic right knee      BREAST BIOPSY Right 09/01/2022   MM RT BREAST BX W LOC DEV 1ST LESION IMAGE BX SPEC STEREO GUIDE 09/01/2022 [facility]   BREAST BIOPSY  10/01/2022   MM RT RADIOACTIVE SEED LOC MAMMO GUIDE 10/01/2022 [facility]MO[facility]   BREAST LUMPECTOMY WITH RADIOACTIVE SEED AND SENTINEL LYMPH NODE BIOPSY Right 10/05/2022   Procedure: RIGHT BREAST LUMPECTOMY WITH RADIOACTIVE SEED AND  AXILLARY SENTINEL LYMPH NODE BIOPSY;  Surgeon: [name], MD;  Location: [facility];  Service: General;  Laterality: Right;   CHOLECYSTECTOMY     COLONOSCOPY  06/06/2017   Pyrtle   Pelvic sling     x2   POLYPECTOMY     PORT A CATH REVISION N/A 11/22/2022   Procedure: PORT A CATH REVISION;  Surgeon: [name], MD;  Location: [facility] OR;  Service: General;  Laterality: N/A;   PORTACATH PLACEMENT Left 10/05/2022   Procedure: INSERTION PORT-A-CATH;  Surgeon: [name], MD;  Location: [facility];  Service: General;  Laterality: Left;   ROTATOR CUFF REPAIR Right    TONSILLECTOMY     TOTAL KNEE ARTHROPLASTY Right 08/12/2017   Procedure: RIGHT TOTAL KNEE ARTHROPLASTY;  Surgeon: Eugenia Mcalpine, MD;  Location: [facility];  Service: Orthopedics;  Laterality: Right;   TOTAL KNEE ARTHROPLASTY Left 01/06/2018   Procedure: LEFT TOTAL KNEE ARTHROPLASTY;  Surgeon: Eugenia Mcalpine, MD;  Location: [facility];  Service: Orthopedics;  Laterality: Left;   UPPER GASTROINTESTINAL ENDOSCOPY     WISDOM TOOTH EXTRACTION      SOCIAL HISTORY: Social History   Socioeconomic History   Marital status: Single    Spouse name: Not on file   Number of children: Not on file   Years of education: Not on file   Highest education level: Not on file  Occupational History   Not on file  Tobacco Use   Smoking status: Former    Current packs/day: 0.00    Types: Cigarettes    Quit date: 1986    Years since quitting: 38.5   Smokeless tobacco: Never  Vaping Use   Vaping status: Never Used  Substance and Sexual Activity   Alcohol use: Yes    Alcohol/week: 2.0 standard drinks of alcohol    Types: 2 Cans of beer per week   Drug use: No   Sexual activity: Not on file  Other Topics Concern   Not on file  Social History Narrative   Not on file   Social Determinants of Health   Financial Resource Strain: Low Risk  (08/22/2022)   Received from [facility], [facility]    Overall Financial Resource Strain (CARDIA)    Difficulty of Paying Living Expenses: Not hard at all  Food Insecurity: No Food Insecurity (09/15/2022)   Hunger Vital Sign    Worried About Running Out of Food in the Last Year: Never true    Ran Out of Food in the Last Year: Never true  Transportation Needs: No Transportation Needs (09/15/2022)   PRAPARE - Administrator, Civil Service (Medical): No    Lack of Transportation (Non-Medical): No  Physical Activity: Sufficiently Active (08/22/2022)   Received from [facility]   Exercise Vital Sign    Days of Exercise per Week: 3 days    Minutes of Exercise per Session: 50 min  Stress: No Stress Concern Present (07/28/2021)   Received from [facility], [facility] of Occupational Health - Occupational Stress Questionnaire    Feeling of Stress : Not at all  Social Connections: Socially Integrated (08/22/2022)   Received from [facility], [facility]   Social Network    How would you rate your social network (family, work, friends)?: Good participation with social networks  Intimate Partner Violence: Not At Risk (08/22/2022)   Received from [facility], [facility]   HITS    Over the last 12 months how often did your partner physically hurt you?: 1    Over the last 12 months how often did your partner insult you or talk down to you?: 1    Over the last 12 months how often did your partner threaten you with physical harm?: 1    Over the last 12 months how often did your partner scream or curse at you?: 1    FAMILY HISTORY: Family History  Problem Relation Age of Onset   Bowel Disease Mother        ishemic bowel   Lung cancer Mother        smoked   Cervical cancer Paternal Aunt    Cancer Maternal Grandfather        unknown type   Lung cancer Paternal Grandfather        smoked   Colon cancer Neg Hx    Breast cancer Neg Hx    Esophageal cancer Neg Hx    Pancreatic cancer Neg  Hx    Prostate cancer Neg Hx    Rectal cancer Neg Hx    Stomach cancer Neg Hx    Allergic rhinitis Neg Hx    Asthma Neg Hx    Eczema Neg Hx    Urticaria Neg Hx    Colon polyps Neg Hx     Review of Systems  Constitutional:  Positive for fatigue. Negative for  appetite change, chills, fever and unexpected weight change.  HENT:   Negative for hearing loss, lump/mass and trouble swallowing.   Eyes:  Negative for eye problems and icterus.  Respiratory:  Negative for chest tightness, cough and shortness of breath.   Cardiovascular:  Negative for chest pain, leg swelling and palpitations.  Gastrointestinal:  Negative for abdominal distention, abdominal pain, constipation, diarrhea, nausea and vomiting.  Endocrine: Negative for hot flashes.  Genitourinary:  Negative for difficulty urinating.   Musculoskeletal:  Negative for arthralgias.  Skin:  Negative for itching and rash.  Neurological:  Negative for dizziness, extremity weakness, headaches and numbness.  Hematological:  Negative for adenopathy. Does not bruise/bleed easily.  Psychiatric/Behavioral:  Negative for depression. The patient is not nervous/anxious.       PHYSICAL EXAMINATION   Onc Performance Status - 03/28/23 1046       ECOG Perf Status   ECOG Perf Status Fully active, able to carry on all pre-disease performance without restriction      KPS SCALE   KPS % SCORE Able to carry on normal activity, minor s/s of disease             Vitals:   03/28/23 1032  BP: 133/73  Pulse: 87  Temp: 97.9 F (36.6 C)  SpO2: 100%    Physical Exam Constitutional:      General: She is not in acute distress.    Appearance: Normal appearance. She is not toxic-appearing.  HENT:     Head: Normocephalic and atraumatic.     Mouth/Throat:     Mouth: Mucous membranes are moist.     Pharynx: Oropharynx is clear. No oropharyngeal exudate or posterior oropharyngeal erythema.  Eyes:     General: No scleral icterus. Cardiovascular:      Rate and Rhythm: Normal rate and regular rhythm.     Pulses: Normal pulses.     Heart sounds: Normal heart sounds.  Pulmonary:     Effort: Pulmonary effort is normal.     Breath sounds: Normal breath sounds.  Abdominal:     General: Abdomen is flat. Bowel sounds are normal. There is no distension.     Palpations: Abdomen is soft.     Tenderness: There is no abdominal tenderness.  Musculoskeletal:        General: No swelling.     Cervical back: Neck supple.  Lymphadenopathy:     Cervical: No cervical adenopathy.  Skin:    General: Skin is warm and dry.     Findings: No rash.  Neurological:     General: No focal deficit present.     Mental Status: She is alert.  Psychiatric:        Mood and Affect: Mood normal.        Behavior: Behavior normal.     LABORATORY DATA:  CBC    Component Value Date/Time   WBC 6.4 03/28/2023 0948   WBC 10.1 10/01/2021 1314   RBC 3.83 (L) 03/28/2023 0948   HGB 10.1 (L) 03/28/2023 0948   HGB 13.7 09/15/2020 1209   HCT 30.9 (L) 03/28/2023 0948   HCT 41.3 09/15/2020 1209   PLT 325 03/28/2023 0948   PLT 349 09/15/2020 1209   MCV 80.7 03/28/2023 0948   MCV 86 09/15/2020 1209   MCH 26.4 03/28/2023 0948   MCHC 32.7 03/28/2023 0948   RDW 16.2 (H) 03/28/2023 0948   RDW 12.3 09/15/2020 1209   LYMPHSABS 1.2 03/28/2023 0948   LYMPHSABS 1.2 09/15/2020 1209  MONOABS 0.8 03/28/2023 0948   EOSABS 0.3 03/28/2023 0948   EOSABS 0.2 09/15/2020 1209   BASOSABS 0.1 03/28/2023 0948   BASOSABS 0.1 09/15/2020 1209    CMP     Component Value Date/Time   NA 138 03/28/2023 0948   NA 135 09/15/2020 1209   K 3.9 03/28/2023 0948   CL 104 03/28/2023 0948   CO2 28 03/28/2023 0948   GLUCOSE 83 03/28/2023 0948   BUN 14 03/28/2023 0948   BUN 14 09/15/2020 1209   CREATININE 0.85 03/28/2023 0948   CALCIUM 9.2 03/28/2023 0948   PROT 6.6 03/28/2023 0948   PROT 6.8 09/15/2020 1209   ALBUMIN 4.0 03/28/2023 0948   ALBUMIN 4.6 09/15/2020 1209   AST 16  03/28/2023 0948   ALT 15 03/28/2023 0948   ALKPHOS 94 03/28/2023 0948   BILITOT 0.4 03/28/2023 0948   GFRNONAA >60 03/28/2023 0948   GFRAA 74 09/15/2020 1209       ASSESSMENT and THERAPY PLAN:   Malignant neoplasm of upper-outer quadrant of right breast in female, estrogen receptor positive (HCC) Judy Morrison is a 72 year old woman with history of stage Ia triple positive breast cancer diagnosed in January 2024 status postlumpectomy and here today to begin adjuvant chemotherapy and immunotherapy with paclitaxel and trastuzumab.  Treatment Plan:  Breast conserving surgery with sentinel node biopsy Adjuvant chemotherapy with Taxol/Herceptin followed by Herceptin maintenance Adjuvant radiation therapy Adjuvant antiestrogen therapy __________________________________________ Current treatment: Herceptin/Adjuvant radiation  Judy Morrison will proceed with every 3-week Herceptin.  Her labs are stable and we reviewed them in detail.  At risk for heart failure: Her most recent echo was low normal at 50 to 55%.  She is undergoing repeat echocardiogram later this month with the heart failure clinic. She will continue with adjuvant radiation therapy.  She is scheduled to receive this through the beginning of August, 2024.  RTC every 3 weeks for f/u and Herceptin.     All questions were answered. The patient knows to call the clinic with any problems, questions or concerns. We can certainly see the patient much sooner if necessary.  Total encounter time:30 minutes*in face-to-face visit time, chart review, lab review, care coordination, order entry, and documentation of the encounter time.    Lillard Anes, NP 03/28/23 11:20 AM Medical Oncology and Hematology [facility] [address] [place], [place] [address] Tel. [phone]    Fax. [phone]  *Total Encounter Time as defined by the Centers for Medicare and Medicaid Services includes, in addition to the face-to-face time of  a patient visit (documented in the note above) non-face-to-face time: obtaining and reviewing outside history, ordering and reviewing medications, tests or procedures, care coordination (communications with other health care professionals or caregivers) and documentation in the medical record.

## 2023-03-28 NOTE — Patient Instructions (Signed)

## 2023-03-29 ENCOUNTER — Other Ambulatory Visit: Payer: Self-pay

## 2023-03-29 ENCOUNTER — Ambulatory Visit
Admission: RE | Admit: 2023-03-29 | Discharge: 2023-03-29 | Disposition: A | Discharge status: Home / Self Care | Payer: Medicare Other | Source: Ambulatory Visit | Attending: Radiation Oncology | Admitting: Radiation Oncology

## 2023-03-29 DIAGNOSIS — Z5112 Encounter for antineoplastic immunotherapy: Secondary | ICD-10-CM | POA: Diagnosis not present

## 2023-03-29 LAB — RAD ONC ARIA SESSION SUMMARY
Course Elapsed Days: 6
Plan Fractions Treated to Date: 5
Plan Prescribed Dose Per Fraction: 2.66 Gy
Plan Total Fractions Prescribed: 16
Plan Total Prescribed Dose: 42.56 Gy
Reference Point Dosage Given to Date: 13.3 Gy
Reference Point Session Dosage Given: 2.66 Gy
Session Number: 5

## 2023-03-30 ENCOUNTER — Other Ambulatory Visit: Payer: Self-pay

## 2023-03-30 ENCOUNTER — Ambulatory Visit
Admission: RE | Admit: 2023-03-30 | Discharge: 2023-03-30 | Disposition: A | Discharge status: Home / Self Care | Payer: Medicare Other | Source: Ambulatory Visit | Attending: Radiation Oncology | Admitting: Radiation Oncology

## 2023-03-30 DIAGNOSIS — Z5112 Encounter for antineoplastic immunotherapy: Secondary | ICD-10-CM | POA: Diagnosis not present

## 2023-03-30 LAB — RAD ONC ARIA SESSION SUMMARY
Course Elapsed Days: 7
Plan Fractions Treated to Date: 6
Plan Prescribed Dose Per Fraction: 2.66 Gy
Plan Total Fractions Prescribed: 16
Plan Total Prescribed Dose: 42.56 Gy
Reference Point Dosage Given to Date: 15.96 Gy
Reference Point Session Dosage Given: 2.66 Gy
Session Number: 6

## 2023-03-31 ENCOUNTER — Other Ambulatory Visit: Payer: Self-pay

## 2023-03-31 ENCOUNTER — Ambulatory Visit
Admission: RE | Admit: 2023-03-31 | Discharge: 2023-03-31 | Disposition: A | Discharge status: Home / Self Care | Payer: Medicare Other | Source: Ambulatory Visit | Attending: Radiation Oncology | Admitting: Radiation Oncology

## 2023-03-31 DIAGNOSIS — Z5112 Encounter for antineoplastic immunotherapy: Secondary | ICD-10-CM | POA: Diagnosis not present

## 2023-03-31 LAB — RAD ONC ARIA SESSION SUMMARY
Course Elapsed Days: 8
Plan Fractions Treated to Date: 7
Plan Prescribed Dose Per Fraction: 2.66 Gy
Plan Total Fractions Prescribed: 16
Plan Total Prescribed Dose: 42.56 Gy
Reference Point Dosage Given to Date: 18.62 Gy
Reference Point Session Dosage Given: 2.66 Gy
Session Number: 7

## 2023-04-01 ENCOUNTER — Other Ambulatory Visit: Payer: Self-pay

## 2023-04-01 ENCOUNTER — Ambulatory Visit
Admission: RE | Admit: 2023-04-01 | Discharge: 2023-04-01 | Disposition: A | Discharge status: Home / Self Care | Payer: Medicare Other | Source: Ambulatory Visit | Attending: Radiation Oncology | Admitting: Radiation Oncology

## 2023-04-01 DIAGNOSIS — Z5112 Encounter for antineoplastic immunotherapy: Secondary | ICD-10-CM | POA: Diagnosis not present

## 2023-04-01 LAB — RAD ONC ARIA SESSION SUMMARY
Course Elapsed Days: 9
Plan Fractions Treated to Date: 8
Plan Prescribed Dose Per Fraction: 2.66 Gy
Plan Total Fractions Prescribed: 16
Plan Total Prescribed Dose: 42.56 Gy
Reference Point Dosage Given to Date: 21.28 Gy
Reference Point Session Dosage Given: 2.66 Gy
Session Number: 8

## 2023-04-04 ENCOUNTER — Telehealth: Payer: Self-pay | Admitting: *Deleted

## 2023-04-04 ENCOUNTER — Ambulatory Visit
Admission: RE | Admit: 2023-04-04 | Discharge: 2023-04-04 | Disposition: A | Discharge status: Home / Self Care | Payer: Medicare Other | Source: Ambulatory Visit | Attending: Radiation Oncology | Admitting: Radiation Oncology

## 2023-04-04 ENCOUNTER — Ambulatory Visit: Payer: Medicare Other | Attending: General Surgery | Admitting: Rehabilitation

## 2023-04-04 ENCOUNTER — Other Ambulatory Visit: Payer: Self-pay

## 2023-04-04 DIAGNOSIS — C50411 Malignant neoplasm of upper-outer quadrant of right female breast: Secondary | ICD-10-CM | POA: Insufficient documentation

## 2023-04-04 DIAGNOSIS — Z170 Estrogen receptor positive status [ER+]: Secondary | ICD-10-CM | POA: Insufficient documentation

## 2023-04-04 DIAGNOSIS — Z5112 Encounter for antineoplastic immunotherapy: Secondary | ICD-10-CM | POA: Diagnosis not present

## 2023-04-04 DIAGNOSIS — Z483 Aftercare following surgery for neoplasm: Secondary | ICD-10-CM | POA: Insufficient documentation

## 2023-04-04 LAB — RAD ONC ARIA SESSION SUMMARY
Course Elapsed Days: 12
Plan Fractions Treated to Date: 9
Plan Prescribed Dose Per Fraction: 2.66 Gy
Plan Total Fractions Prescribed: 16
Plan Total Prescribed Dose: 42.56 Gy
Reference Point Dosage Given to Date: 23.94 Gy
Reference Point Session Dosage Given: 2.66 Gy
Session Number: 9

## 2023-04-04 NOTE — Therapy (Signed)
OUTPATIENT PHYSICAL THERAPY SOZO SCREENING NOTE   Patient Name: Judy Morrison MRN: [id number] DOB:15-Oct-1950, 72 y.o., female Today's Date: 04/04/2023  PCP: Medicine, [facility] REFERRING PROVIDER: [name], MD   PT End of Session - 04/04/23 0913     Visit Number 4   screen only            Past Medical History:  Diagnosis Date   Anemia    Anxiety    Arthritis    Depressed    Dermatitis    rare form, on prednisone   GERD (gastroesophageal reflux disease)    controlled with Omeprazole   Glaucoma    Torn rotator cuff    RIGHT    Urinary tract infection    dx 05-26-17 took 1 week cipro BID completed 06-02-17.   Past Surgical History:  Procedure Laterality Date   ADENOIDECTOMY     APPENDECTOMY     arthroscopic right knee      BREAST BIOPSY Right 09/01/2022   MM RT BREAST BX W LOC DEV 1ST LESION IMAGE BX SPEC STEREO GUIDE 09/01/2022 [facility]   BREAST BIOPSY  10/01/2022   MM RT RADIOACTIVE SEED LOC MAMMO GUIDE 10/01/2022 [facility]   BREAST LUMPECTOMY WITH RADIOACTIVE SEED AND SENTINEL LYMPH NODE BIOPSY Right 10/05/2022   Procedure: RIGHT BREAST LUMPECTOMY WITH RADIOACTIVE SEED AND AXILLARY SENTINEL LYMPH NODE BIOPSY;  Surgeon: [name], MD;  Location: [facility];  Service: General;  Laterality: Right;   CHOLECYSTECTOMY     COLONOSCOPY  06/06/2017   Pyrtle   Pelvic sling     x2   POLYPECTOMY     PORT A CATH REVISION N/A 11/22/2022   Procedure: PORT A CATH REVISION;  Surgeon: [name], MD;  Location: [facility];  Service: General;  Laterality: N/A;   PORTACATH PLACEMENT Left 10/05/2022   Procedure: INSERTION PORT-A-CATH;  Surgeon: [name], MD;  Location: [facility];  Service: General;  Laterality: Left;   ROTATOR CUFF REPAIR Right    TONSILLECTOMY     TOTAL KNEE ARTHROPLASTY Right 08/12/2017   Procedure: RIGHT TOTAL KNEE ARTHROPLASTY;  Surgeon: Eugenia Mcalpine, MD;   Location: [facility];  Service: Orthopedics;  Laterality: Right;   TOTAL KNEE ARTHROPLASTY Left 01/06/2018   Procedure: LEFT TOTAL KNEE ARTHROPLASTY;  Surgeon: Eugenia Mcalpine, MD;  Location: [facility];  Service: Orthopedics;  Laterality: Left;   UPPER GASTROINTESTINAL ENDOSCOPY     WISDOM TOOTH EXTRACTION     Patient Active Problem List   Diagnosis Date Noted   Port-A-Cath in place 11/15/2022   Genetic testing 10/04/2022   Monoallelic mutation of ATM gene 78/29/5621   Malignant neoplasm of upper-outer quadrant of right breast in female, estrogen receptor positive (HCC) 09/10/2022   Rash and other nonspecific skin eruption 09/15/2020   Allergic contact dermatitis 09/15/2020   Pruritus 08/14/2020   Dizziness 05/10/2018   Atypical chest pain 05/10/2018   Primary osteoarthritis of left knee 01/06/2018   S/P knee replacement 08/12/2017   Incomplete tear of right rotator cuff 05/19/2017   Osteopenia 05/19/2017   Urge incontinence of urine 05/19/2017   Primary open angle glaucoma (POAG) 12/30/2016   Primary osteoarthritis of both knees 12/30/2016   Recurrent oral herpes simplex infection 12/30/2016   Tubular adenoma of colon 12/30/2016   Hypothyroidism 06/25/2016   Right foot pain 12/19/2014   Anxiety 05/21/2011   Seasonal allergies 05/21/2011    REFERRING DIAG: right breast cancer at risk for lymphedema  THERAPY  DIAG:  Aftercare following surgery for neoplasm  Malignant neoplasm of upper-outer quadrant of right breast in female, estrogen receptor positive (HCC)  PERTINENT HISTORY: Patient was diagnosed on 08/02/2022 with right grade 2 invasive ductal carcinoma breast cancer. It measures 1.5 cm and is located in the upper outer quadrant. It is triple positive with a Ki67 of 5%. She had a rotator cuff repair in her right shoulder 10/01/2021 which continues to be problematic. She had a right lumpectomy with SLNB on 10/05/2022   PRECAUTIONS: right UE Lymphedema risk, None  SUBJECTIVE: Pt  returns for her 3 month L-Dex screen .  PAIN:  Are you having pain? No  SOZO SCREENING: Patient was assessed today using the SOZO machine to determine the lymphedema index score. This was compared to her baseline score. It was determined that she is within the recommended range when compared to her baseline and no further action is needed at this time. She will continue SOZO screenings. These are done every 3 months for 2 years post operatively followed by every 6 months for 2 years, and then annually.   L-DEX FLOWSHEETS - 04/04/23 0900       L-DEX LYMPHEDEMA SCREENING   Measurement Type Unilateral    L-DEX MEASUREMENT EXTREMITY Upper Extremity    POSITION  Standing    DOMINANT SIDE Right    At Risk Side Right    BASELINE SCORE (UNILATERAL) -3.4    L-DEX SCORE (UNILATERAL) -5.7    VALUE CHANGE (UNILAT) -2.3              Amarie Viles, Julieanne Manson, PT 04/04/2023, 9:13 AM

## 2023-04-04 NOTE — Telephone Encounter (Signed)
Pt wanted to know if it is OK to take tramadol with her treatment. Notified that it is OK.

## 2023-04-05 ENCOUNTER — Other Ambulatory Visit: Payer: Self-pay

## 2023-04-05 ENCOUNTER — Ambulatory Visit
Admission: RE | Admit: 2023-04-05 | Discharge: 2023-04-05 | Disposition: A | Discharge status: Home / Self Care | Payer: Medicare Other | Source: Ambulatory Visit | Attending: Radiation Oncology | Admitting: Radiation Oncology

## 2023-04-05 ENCOUNTER — Encounter (HOSPITAL_COMMUNITY): Payer: Self-pay | Admitting: Cardiology

## 2023-04-05 ENCOUNTER — Other Ambulatory Visit: Payer: Self-pay | Admitting: Hematology and Oncology

## 2023-04-05 ENCOUNTER — Ambulatory Visit (HOSPITAL_BASED_OUTPATIENT_CLINIC_OR_DEPARTMENT_OTHER)
Admission: RE | Admit: 2023-04-05 | Discharge: 2023-04-05 | Disposition: A | Discharge status: Home / Self Care | Payer: Medicare Other | Source: Ambulatory Visit | Attending: Cardiology | Admitting: Cardiology

## 2023-04-05 VITALS — BP 128/70 | HR 89 | Wt 172.2 lb

## 2023-04-05 DIAGNOSIS — Z803 Family history of malignant neoplasm of breast: Secondary | ICD-10-CM | POA: Insufficient documentation

## 2023-04-05 DIAGNOSIS — Z5181 Encounter for therapeutic drug level monitoring: Secondary | ICD-10-CM

## 2023-04-05 DIAGNOSIS — R9431 Abnormal electrocardiogram [ECG] [EKG]: Secondary | ICD-10-CM | POA: Insufficient documentation

## 2023-04-05 DIAGNOSIS — Z170 Estrogen receptor positive status [ER+]: Secondary | ICD-10-CM | POA: Diagnosis not present

## 2023-04-05 DIAGNOSIS — C50919 Malignant neoplasm of unspecified site of unspecified female breast: Secondary | ICD-10-CM | POA: Insufficient documentation

## 2023-04-05 DIAGNOSIS — I082 Rheumatic disorders of both aortic and tricuspid valves: Secondary | ICD-10-CM | POA: Insufficient documentation

## 2023-04-05 DIAGNOSIS — I5040 Unspecified combined systolic (congestive) and diastolic (congestive) heart failure: Secondary | ICD-10-CM | POA: Insufficient documentation

## 2023-04-05 DIAGNOSIS — I447 Left bundle-branch block, unspecified: Secondary | ICD-10-CM | POA: Insufficient documentation

## 2023-04-05 DIAGNOSIS — Z09 Encounter for follow-up examination after completed treatment for conditions other than malignant neoplasm: Secondary | ICD-10-CM | POA: Insufficient documentation

## 2023-04-05 DIAGNOSIS — I351 Nonrheumatic aortic (valve) insufficiency: Secondary | ICD-10-CM | POA: Diagnosis not present

## 2023-04-05 DIAGNOSIS — Z79899 Other long term (current) drug therapy: Secondary | ICD-10-CM

## 2023-04-05 DIAGNOSIS — C50411 Malignant neoplasm of upper-outer quadrant of right female breast: Secondary | ICD-10-CM

## 2023-04-05 DIAGNOSIS — I5089 Other heart failure: Secondary | ICD-10-CM | POA: Diagnosis not present

## 2023-04-05 DIAGNOSIS — R079 Chest pain, unspecified: Secondary | ICD-10-CM | POA: Insufficient documentation

## 2023-04-05 DIAGNOSIS — R42 Dizziness and giddiness: Secondary | ICD-10-CM | POA: Insufficient documentation

## 2023-04-05 DIAGNOSIS — Z5112 Encounter for antineoplastic immunotherapy: Secondary | ICD-10-CM | POA: Diagnosis not present

## 2023-04-05 LAB — RAD ONC ARIA SESSION SUMMARY
Course Elapsed Days: 13
Plan Fractions Treated to Date: 10
Plan Prescribed Dose Per Fraction: 2.66 Gy
Plan Total Fractions Prescribed: 16
Plan Total Prescribed Dose: 42.56 Gy
Reference Point Dosage Given to Date: 26.6 Gy
Reference Point Session Dosage Given: 2.66 Gy
Session Number: 10

## 2023-04-05 LAB — BRAIN NATRIURETIC PEPTIDE: B Natriuretic Peptide: 6.5 pg/mL (ref 0.0–100.0)

## 2023-04-05 LAB — TROPONIN I (HIGH SENSITIVITY): Troponin I (High Sensitivity): 4 ng/L (ref <18)

## 2023-04-05 MED ORDER — FUROSEMIDE 20 MG PO TABS: 20.0000 mg | ORAL_TABLET | ORAL | 3 refills | Status: DC | PRN

## 2023-04-05 NOTE — Progress Notes (Signed)
ADVANCED HEART FAILURE CLINIC NOTE  Referring Physician: Medicine, [facility]*  Primary Care: Medicine, [facility] Oncologist: Dr. Al Pimple  HPI: Judy Morrison is a 72 y.o. female with breast cancer presenting today to establish care.  Her cancer history dates back to November 2023 when screening mammogram demonstrated right breast irregularity.  The following months she had a pathology confirmed grade 2 invasive mammary carcinoma.  She underwent right breast lumpectomy by Dr. 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  She was started on chemotherapy in November 08, 2022 with paclitaxel, trastuzumab.  Interval hx:  -Started Herceptin treatment.  Tolerating it very well.  Only complaints are poor balance which appears to have been present since prior to cancer diagnosis.  -No shortness of breath, chest pain, palpitations or other clinical signs of heart failure.  Past Medical History:  Diagnosis Date   Anemia    Anxiety    Arthritis    Depressed    Dermatitis    rare form, on prednisone   GERD (gastroesophageal reflux disease)    controlled with Omeprazole   Glaucoma    Torn rotator cuff    RIGHT    Urinary tract infection    dx 05-26-17 took 1 week cipro BID completed 06-02-17.    Current Outpatient Medications  Medication Sig Dispense Refill   Adapalene 0.3 % gel Apply 1 application daily as needed topically (for acne).     ALPRAZolam (XANAX) 0.25 MG tablet Take 0.25 mg by mouth daily as needed for anxiety.   1   brexpiprazole (REXULTI) 2 MG TABS tablet Take 2 mg by mouth daily after breakfast.     clobetasol cream (TEMOVATE) 0.05 % Apply 1 Application topically 2 (two) times daily as needed (irritation).     clonazePAM (KLONOPIN) 0.5 MG tablet Take 0.5 mg by mouth 2 (two) times daily.     clotrimazole (MYCELEX) 10 MG troche Take 10 mg by mouth as directed. 2  times per week     DULoxetine (CYMBALTA) 20 MG capsule Take 20 mg by mouth daily.     hydrOXYzine (VISTARIL) 25 MG capsule Take 25-50 mg by mouth daily.     latanoprost (XALATAN) 0.005 % ophthalmic solution Place 1 drop into both eyes at bedtime.     levothyroxine (SYNTHROID, LEVOTHROID) 50 MCG tablet Take 50 mcg at bedtime by mouth.      lidocaine-prilocaine (EMLA) cream Apply 1 Application to affected area topically once daily. 30 g 3   loratadine (CLARITIN) 10 MG tablet Take 10 mg by mouth daily.     nortriptyline (PAMELOR) 50 MG capsule Take 100 mg at bedtime by mouth.      omeprazole (PRILOSEC) 40 MG capsule Take 40 mg by mouth daily before breakfast.     ondansetron (ZOFRAN) 8 MG tablet Take 1 tablet (8 mg total) by mouth every 8 (eight) hours as needed for nausea or vomiting. 30 tablet 1   OVER THE COUNTER MEDICATION 20 mLs by Mouth Rinse route 4 (four) times daily as needed (mouth pain). Oral-B Sore Mouth Rinse     prochlorperazine (COMPAZINE) 10 MG tablet Take 1 tablet (10 mg total) by mouth every 6 (six) hours as needed for nausea or vomiting. 30 tablet 1   simvastatin (ZOCOR) 10 MG tablet Take 10 mg by mouth daily.     tacrolimus (PROGRAF) 1 MG capsule Take 1 mg by mouth 2 (two) times  daily. Per patient - Dissolve capsule in one half liter of H2O. Swish and spit twice daily     triamcinolone cream (KENALOG) 0.1 % Apply 1 Application topically 2 (two) times daily as needed (irritation).     valACYclovir (VALTREX) 1000 MG tablet Take 1,000 mg by mouth daily as needed (outbreaks).     No current facility-administered medications for this encounter.   Facility-Administered Medications Ordered in Other Encounters  Medication Dose Route Frequency Provider Last Rate Last Admin   sodium chloride flush (NS) 0.9 % injection 10 mL  10 mL Intracatheter PRN Iruku, Praveena, MD        Allergies  Allergen Reactions   Penicillin G Anaphylaxis   Cephalexin Other (See Comments)    Pt does not  remember reaction    Codeine Nausea Only   Erythromycin Itching      Social History   Socioeconomic History   Marital status: Single    Spouse name: Not on file   Number of children: Not on file   Years of education: Not on file   Highest education level: Not on file  Occupational History   Not on file  Tobacco Use   Smoking status: Former    Current packs/day: 0.00    Types: Cigarettes    Quit date: 45    Years since quitting: 38.5   Smokeless tobacco: Never  Vaping Use   Vaping status: Never Used  Substance and Sexual Activity   Alcohol use: Yes    Alcohol/week: 2.0 standard drinks of alcohol    Types: 2 Cans of beer per week   Drug use: No   Sexual activity: Not on file  Other Topics Concern   Not on file  Social History Narrative   Not on file   Social Determinants of Health   Financial Resource Strain: Low Risk  (08/22/2022)   Received from [facility], [facility]   Overall Financial Resource Strain (CARDIA)    Difficulty of Paying Living Expenses: Not hard at all  Food Insecurity: No Food Insecurity (09/15/2022)   Hunger Vital Sign    Worried About Running Out of Food in the Last Year: Never true    Ran Out of Food in the Last Year: Never true  Transportation Needs: No Transportation Needs (09/15/2022)   PRAPARE - Administrator, Civil Service (Medical): No    Lack of Transportation (Non-Medical): No  Physical Activity: Sufficiently Active (08/22/2022)   Received from [facility], [facility]   Exercise Vital Sign    Days of Exercise per Week: 3 days    Minutes of Exercise per Session: 50 min  Stress: No Stress Concern Present (07/28/2021)   Received from [facility], [facility] of Occupational Health - Occupational Stress Questionnaire    Feeling of Stress : Not at all  Social Connections: Socially Integrated (08/22/2022)   Received from [facility], [facility]   Social Network    How would you rate  your social network (family, work, friends)?: Good participation with social networks  Intimate Partner Violence: Not At Risk (08/22/2022)   Received from [facility], [facility]   HITS    Over the last 12 months how often did your partner physically hurt you?: 1    Over the last 12 months how often did your partner insult you or talk down to you?: 1    Over the last 12 months how often did your partner threaten you with  physical harm?: 1    Over the last 12 months how often did your partner scream or curse at you?: 1      Family History  Problem Relation Age of Onset   Bowel Disease Mother        ishemic bowel   Lung cancer Mother        smoked   Cervical cancer Paternal Aunt    Cancer Maternal Grandfather        unknown type   Lung cancer Paternal Grandfather        smoked   Colon cancer Neg Hx    Breast cancer Neg Hx    Esophageal cancer Neg Hx    Pancreatic cancer Neg Hx    Prostate cancer Neg Hx    Rectal cancer Neg Hx    Stomach cancer Neg Hx    Allergic rhinitis Neg Hx    Asthma Neg Hx    Eczema Neg Hx    Urticaria Neg Hx    Colon polyps Neg Hx     PHYSICAL EXAM: Vitals:   04/05/23 1359  BP: 128/70  Pulse: 89  SpO2: 96%   GENERAL: Well nourished, well developed, and in no apparent distress at rest.  HEENT: Negative for arcus senilis or xanthelasma. There is no scleral icterus.  The mucous membranes are pink and moist.   NECK: Supple, No masses. Normal carotid upstrokes without bruits. No masses or thyromegaly.    CHEST: There are no chest wall deformities. There is no chest wall tenderness. Respirations are unlabored.  Lungs- CTA B/L CARDIAC:  JVP: 7 cm          Normal rate with regular rhythm. No murmurs, rubs or gallops.  Pulses are 2+ and symmetrical in upper and lower extremities. Mild pretibial pitting edema.  ABDOMEN: Soft, non-tender, non-distended. There are no masses or hepatomegaly. There are normal bowel sounds.  EXTREMITIES: Warm and well  perfused with no cyanosis, clubbing.  LYMPHATIC: No axillary or supraclavicular lymphadenopathy.  NEUROLOGIC: Patient is oriented x3 with no focal or lateralizing neurologic deficits.  PSYCH: Patients affect is appropriate, there is no evidence of anxiety or depression.  SKIN: Warm and dry; no lesions or wounds.    DATA REVIEW  ECG: 10/02/21: NSR, LVH  As per my personal interpretation  ECHO: 02/14/23: LVEF 50-55, GLS -13%, normal RV function as per my personal interpretation 10/18/22: LVEF 55%, GLS -15% normal RV function, mild AI 04/05/23: LVEF 65%, normal RV function, mild AI, GLS -19.9%.    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  GLS has suboptimal tracing and is mostly unchanged.   - Repeat TTE today (04/05/23) with LVEF of 60-65% and GLS 19.9%. No changes in LV function. Continues to have some septal dyssynchorny due to underlying LBBB. Now undergoing treatment with every 21 day herceptin; thus far tolerating it very well.  - Repeat BNP/hsTrop today.  - Follow up in 24m with repeat TTE.    2.  Invasive ductal carcinoma -Following with Dr. Al Pimple -Completed initial chemo regimen; now on q21d treatment with Herceptin with plan to continue for 1 year. Will monitor with serial q15m echocardiograms.    Donnika Kucher Advanced Heart Failure Mechanical Circulatory Support

## 2023-04-05 NOTE — Patient Instructions (Signed)
Take Lasix 20 mg as needed for swelling, weight gain of 3 lb in 24 hours or 5lb in a week.    Labs done today, your results will be available in MyChart, we will contact you for abnormal readings.  Your physician has requested that you have an echocardiogram. Echocardiography is a painless test that uses sound waves to create images of your heart. It provides your doctor with information about the size and shape of your heart and how well your heart's chambers and valves are working. This procedure takes approximately one hour. There are no restrictions for this procedure. Please do NOT wear cologne, perfume, aftershave, or lotions (deodorant is allowed). Please arrive 15 minutes prior to your appointment time.  Your physician recommends that you schedule a follow-up appointment in: 3 months with an echocardiogram ( October) **PLEASE CALL THE OFFICE IN MID AUGUST TO ARRANGE YOUR FOLLOW UP APPOINTMENT. **  If you have any questions or concerns before your next appointment please send [place] a message through [place] or call our office at [phone].    TO LEAVE A MESSAGE FOR THE NURSE SELECT OPTION 2, PLEASE LEAVE A MESSAGE INCLUDING: YOUR NAME DATE OF BIRTH CALL BACK NUMBER REASON FOR CALL**this is important as we prioritize the call backs  YOU WILL RECEIVE A CALL BACK THE SAME DAY AS LONG AS YOU CALL BEFORE 4:00 PM  At the [facility], you and your health needs are our priority. As part of our continuing mission to provide you with exceptional heart care, we have created designated Provider Care Teams. These Care Teams include your primary Cardiologist (physician) and Advanced Practice Providers (APPs- Physician Assistants and Nurse Practitioners) who all work together to provide you with the care you need, when you need it.   You may see any of the following providers on your designated Care Team at your next follow up: Dr Arvilla Meres Dr Marca Ancona Dr. Marcos Eke, NP Robbie Lis, [facility] [place], [place] Brynda Peon, NP Karle Plumber, PharmD   Please be sure to bring in all your medications bottles to every appointment.    Thank you for choosing [facility]

## 2023-04-05 NOTE — Progress Notes (Signed)
  Echocardiogram 2D Echocardiogram has been performed.  Janalyn Harder 04/05/2023, 1:48 PM

## 2023-04-06 ENCOUNTER — Other Ambulatory Visit: Payer: Self-pay

## 2023-04-06 ENCOUNTER — Ambulatory Visit
Admission: RE | Admit: 2023-04-06 | Discharge: 2023-04-06 | Disposition: A | Discharge status: Home / Self Care | Payer: Medicare Other | Source: Ambulatory Visit | Attending: Radiation Oncology | Admitting: Radiation Oncology

## 2023-04-06 DIAGNOSIS — Z5112 Encounter for antineoplastic immunotherapy: Secondary | ICD-10-CM | POA: Diagnosis not present

## 2023-04-06 LAB — RAD ONC ARIA SESSION SUMMARY
Course Elapsed Days: 14
Plan Fractions Treated to Date: 11
Plan Prescribed Dose Per Fraction: 2.66 Gy
Plan Total Fractions Prescribed: 16
Plan Total Prescribed Dose: 42.56 Gy
Reference Point Dosage Given to Date: 29.26 Gy
Reference Point Session Dosage Given: 2.66 Gy
Session Number: 11

## 2023-04-06 LAB — ECHOCARDIOGRAM COMPLETE
Area-P 1/2: 5.13 cm2
Calc EF: 51 %
P 1/2 time: 864 msec
S' Lateral: 2.6 cm
Single Plane A2C EF: 54.3 %
Single Plane A4C EF: 49.6 %

## 2023-04-07 ENCOUNTER — Other Ambulatory Visit: Payer: Self-pay

## 2023-04-07 ENCOUNTER — Ambulatory Visit
Admission: RE | Admit: 2023-04-07 | Discharge: 2023-04-07 | Disposition: A | Discharge status: Home / Self Care | Payer: Medicare Other | Source: Ambulatory Visit | Attending: Radiation Oncology | Admitting: Radiation Oncology

## 2023-04-07 DIAGNOSIS — Z5112 Encounter for antineoplastic immunotherapy: Secondary | ICD-10-CM | POA: Diagnosis not present

## 2023-04-07 LAB — RAD ONC ARIA SESSION SUMMARY
Course Elapsed Days: 15
Plan Fractions Treated to Date: 12
Plan Prescribed Dose Per Fraction: 2.66 Gy
Plan Total Fractions Prescribed: 16
Plan Total Prescribed Dose: 42.56 Gy
Reference Point Dosage Given to Date: 31.92 Gy
Reference Point Session Dosage Given: 2.66 Gy
Session Number: 12

## 2023-04-08 ENCOUNTER — Other Ambulatory Visit: Payer: Self-pay

## 2023-04-08 ENCOUNTER — Ambulatory Visit: Payer: Medicare Other | Admitting: Radiation Oncology

## 2023-04-08 ENCOUNTER — Ambulatory Visit
Admission: RE | Admit: 2023-04-08 | Discharge: 2023-04-08 | Disposition: A | Discharge status: Home / Self Care | Payer: Medicare Other | Source: Ambulatory Visit | Attending: Radiation Oncology | Admitting: Radiation Oncology

## 2023-04-08 DIAGNOSIS — Z5112 Encounter for antineoplastic immunotherapy: Secondary | ICD-10-CM | POA: Diagnosis not present

## 2023-04-08 LAB — RAD ONC ARIA SESSION SUMMARY
Course Elapsed Days: 16
Plan Fractions Treated to Date: 13
Plan Prescribed Dose Per Fraction: 2.66 Gy
Plan Total Fractions Prescribed: 16
Plan Total Prescribed Dose: 42.56 Gy
Reference Point Dosage Given to Date: 34.58 Gy
Reference Point Session Dosage Given: 2.66 Gy
Session Number: 13

## 2023-04-11 ENCOUNTER — Other Ambulatory Visit: Payer: Self-pay

## 2023-04-11 ENCOUNTER — Ambulatory Visit: Admission: RE | Admit: 2023-04-11 | Payer: Medicare Other | Source: Ambulatory Visit

## 2023-04-11 DIAGNOSIS — Z5112 Encounter for antineoplastic immunotherapy: Secondary | ICD-10-CM | POA: Diagnosis not present

## 2023-04-11 LAB — RAD ONC ARIA SESSION SUMMARY
Course Elapsed Days: 19
Plan Fractions Treated to Date: 14
Plan Prescribed Dose Per Fraction: 2.66 Gy
Plan Total Fractions Prescribed: 16
Plan Total Prescribed Dose: 42.56 Gy
Reference Point Dosage Given to Date: 37.24 Gy
Reference Point Session Dosage Given: 2.66 Gy
Session Number: 14

## 2023-04-12 ENCOUNTER — Ambulatory Visit: Admission: RE | Admit: 2023-04-12 | Payer: Medicare Other | Source: Ambulatory Visit

## 2023-04-12 ENCOUNTER — Other Ambulatory Visit: Payer: Self-pay

## 2023-04-12 DIAGNOSIS — Z5112 Encounter for antineoplastic immunotherapy: Secondary | ICD-10-CM | POA: Diagnosis not present

## 2023-04-12 LAB — RAD ONC ARIA SESSION SUMMARY
Course Elapsed Days: 20
Plan Fractions Treated to Date: 15
Plan Prescribed Dose Per Fraction: 2.66 Gy
Plan Total Fractions Prescribed: 16
Plan Total Prescribed Dose: 42.56 Gy
Reference Point Dosage Given to Date: 39.9 Gy
Reference Point Session Dosage Given: 2.66 Gy
Session Number: 15

## 2023-04-13 ENCOUNTER — Other Ambulatory Visit: Payer: Self-pay

## 2023-04-13 ENCOUNTER — Ambulatory Visit
Admission: RE | Admit: 2023-04-13 | Discharge: 2023-04-13 | Disposition: A | Discharge status: Home / Self Care | Payer: Medicare Other | Source: Ambulatory Visit | Attending: Radiation Oncology | Admitting: Radiation Oncology

## 2023-04-13 DIAGNOSIS — Z5112 Encounter for antineoplastic immunotherapy: Secondary | ICD-10-CM | POA: Diagnosis not present

## 2023-04-13 LAB — RAD ONC ARIA SESSION SUMMARY
Course Elapsed Days: 21
Plan Fractions Treated to Date: 16
Plan Prescribed Dose Per Fraction: 2.66 Gy
Plan Total Fractions Prescribed: 16
Plan Total Prescribed Dose: 42.56 Gy
Reference Point Dosage Given to Date: 42.56 Gy
Reference Point Session Dosage Given: 2.66 Gy
Session Number: 16

## 2023-04-14 ENCOUNTER — Ambulatory Visit
Admission: RE | Admit: 2023-04-14 | Discharge: 2023-04-14 | Disposition: A | Discharge status: Home / Self Care | Payer: Medicare Other | Source: Ambulatory Visit | Attending: Radiation Oncology | Admitting: Radiation Oncology

## 2023-04-14 ENCOUNTER — Other Ambulatory Visit: Payer: Self-pay

## 2023-04-14 DIAGNOSIS — Z170 Estrogen receptor positive status [ER+]: Secondary | ICD-10-CM | POA: Insufficient documentation

## 2023-04-14 DIAGNOSIS — C50411 Malignant neoplasm of upper-outer quadrant of right female breast: Secondary | ICD-10-CM | POA: Diagnosis present

## 2023-04-14 LAB — RAD ONC ARIA SESSION SUMMARY
Course Elapsed Days: 22
Plan Fractions Treated to Date: 1
Plan Prescribed Dose Per Fraction: 2 Gy
Plan Total Fractions Prescribed: 4
Plan Total Prescribed Dose: 8 Gy
Reference Point Dosage Given to Date: 2 Gy
Reference Point Session Dosage Given: 2 Gy
Session Number: 17

## 2023-04-15 ENCOUNTER — Other Ambulatory Visit: Payer: Self-pay

## 2023-04-15 ENCOUNTER — Ambulatory Visit: Admission: RE | Admit: 2023-04-15 | Payer: Medicare Other | Source: Ambulatory Visit

## 2023-04-15 ENCOUNTER — Ambulatory Visit
Admission: RE | Admit: 2023-04-15 | Discharge: 2023-04-15 | Disposition: A | Discharge status: Home / Self Care | Payer: Medicare Other | Source: Ambulatory Visit | Attending: Radiation Oncology | Admitting: Radiation Oncology

## 2023-04-15 DIAGNOSIS — C50411 Malignant neoplasm of upper-outer quadrant of right female breast: Secondary | ICD-10-CM | POA: Diagnosis not present

## 2023-04-15 LAB — RAD ONC ARIA SESSION SUMMARY
Course Elapsed Days: 23
Plan Fractions Treated to Date: 2
Plan Prescribed Dose Per Fraction: 2 Gy
Plan Total Fractions Prescribed: 4
Plan Total Prescribed Dose: 8 Gy
Reference Point Dosage Given to Date: 4 Gy
Reference Point Session Dosage Given: 2 Gy
Session Number: 18

## 2023-04-18 ENCOUNTER — Other Ambulatory Visit: Payer: Self-pay

## 2023-04-18 ENCOUNTER — Ambulatory Visit: Admission: RE | Admit: 2023-04-18 | Payer: Medicare Other | Source: Ambulatory Visit

## 2023-04-18 ENCOUNTER — Encounter: Payer: Self-pay | Admitting: *Deleted

## 2023-04-18 ENCOUNTER — Inpatient Hospital Stay: Payer: Medicare Other | Attending: Hematology and Oncology

## 2023-04-18 ENCOUNTER — Inpatient Hospital Stay (HOSPITAL_BASED_OUTPATIENT_CLINIC_OR_DEPARTMENT_OTHER): Payer: Medicare Other | Admitting: Adult Health

## 2023-04-18 ENCOUNTER — Encounter: Payer: Self-pay | Admitting: Adult Health

## 2023-04-18 VITALS — BP 136/75 | HR 88 | Temp 97.6°F | Resp 16 | Wt 166.2 lb

## 2023-04-18 DIAGNOSIS — Z801 Family history of malignant neoplasm of trachea, bronchus and lung: Secondary | ICD-10-CM | POA: Insufficient documentation

## 2023-04-18 DIAGNOSIS — Z79811 Long term (current) use of aromatase inhibitors: Secondary | ICD-10-CM | POA: Diagnosis not present

## 2023-04-18 DIAGNOSIS — Z5112 Encounter for antineoplastic immunotherapy: Secondary | ICD-10-CM | POA: Insufficient documentation

## 2023-04-18 DIAGNOSIS — C50411 Malignant neoplasm of upper-outer quadrant of right female breast: Secondary | ICD-10-CM | POA: Diagnosis not present

## 2023-04-18 DIAGNOSIS — Z8049 Family history of malignant neoplasm of other genital organs: Secondary | ICD-10-CM | POA: Insufficient documentation

## 2023-04-18 DIAGNOSIS — Z170 Estrogen receptor positive status [ER+]: Secondary | ICD-10-CM | POA: Insufficient documentation

## 2023-04-18 DIAGNOSIS — Z87891 Personal history of nicotine dependence: Secondary | ICD-10-CM | POA: Insufficient documentation

## 2023-04-18 LAB — RAD ONC ARIA SESSION SUMMARY
Course Elapsed Days: 26
Plan Fractions Treated to Date: 3
Plan Prescribed Dose Per Fraction: 2 Gy
Plan Total Fractions Prescribed: 4
Plan Total Prescribed Dose: 8 Gy
Reference Point Dosage Given to Date: 6 Gy
Reference Point Session Dosage Given: 2 Gy
Session Number: 19

## 2023-04-18 MED ORDER — SODIUM CHLORIDE 0.9% FLUSH
10.0000 mL | INTRAVENOUS | Status: DC | PRN
  Administered 2023-04-18: 10 mL

## 2023-04-18 MED ORDER — DIPHENHYDRAMINE HCL 25 MG PO CAPS
50.0000 mg | ORAL_CAPSULE | Freq: Once | ORAL | Status: AC
  Administered 2023-04-18: 50 mg via ORAL
  Filled 2023-04-18: qty 2

## 2023-04-18 MED ORDER — HEPARIN SOD (PORK) LOCK FLUSH 100 UNIT/ML IV SOLN
500.0000 [IU] | Freq: Once | INTRAVENOUS | Status: AC | PRN
  Administered 2023-04-18: 500 [IU]

## 2023-04-18 MED ORDER — SODIUM CHLORIDE 0.9 % IV SOLN: Freq: Once | INTRAVENOUS | Status: AC

## 2023-04-18 MED ORDER — ACETAMINOPHEN 325 MG PO TABS
650.0000 mg | ORAL_TABLET | Freq: Once | ORAL | Status: AC
  Administered 2023-04-18: 650 mg via ORAL
  Filled 2023-04-18: qty 2

## 2023-04-18 MED ORDER — TRASTUZUMAB-DTTB CHEMO 150 MG IV SOLR
6.0000 mg/kg | Freq: Once | INTRAVENOUS | Status: AC
  Administered 2023-04-18: 420 mg via INTRAVENOUS
  Filled 2023-04-18: qty 20

## 2023-04-18 NOTE — Progress Notes (Unsigned)
[facility] Cancer Follow up:    Medicine, [facility] Family [address] [place] [address]   DIAGNOSIS: Cancer Staging  Malignant neoplasm of upper-outer quadrant of right breast in female, estrogen receptor positive (HCC) Staging form: Breast, AJCC 8th Edition - Clinical stage from 09/15/2022: Stage IA (cT1c, cN0, cM0, G2, ER+, PR+, HER2+) - Unsigned Stage prefix: Initial diagnosis Histologic grading system: 3 grade system Laterality: Right Staged by: Pathologist and managing physician Stage used in treatment planning: Yes National guidelines used in treatment planning: Yes Type of national guideline used in treatment planning: NCCN - Pathologic stage from 10/05/2022: Stage IA (pT1c, pN0, cM0, G2, ER+, PR+, HER2+) - Signed by Loa Socks, NP on 11/08/2022 Stage prefix: Initial diagnosis Histologic grading system: 3 grade system   SUMMARY OF ONCOLOGIC HISTORY: Oncology History  Malignant neoplasm of upper-outer quadrant of right breast in female, estrogen receptor positive (HCC)  08/02/2022 Mammogram   In the right breast, possible distortion warrants further evaluation. In the left breast, no findings suspicious for malignancy. Diagnostic mammogram showed persistent subtle distortion central slightly LATERAL aspect of the RIGHT breast warranting tissue diagnosis.     09/01/2022 Pathology Results   Final pathology showed grade 2 invasive mammary carcinoma, prognostic showed ER 90% positive strong staining PR 90% positive strong staining, Ki-67 of 5% and HER2 3+ by [facility]   09/10/2022 Initial Diagnosis   Malignant neoplasm of upper-outer quadrant of right breast in female, estrogen receptor positive (HCC)    Genetic Testing   [facility] CancerNext-Expanded Panel+RNA was Positive. 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  Report date is 10/04/2022.  The CancerNext-Expanded gene panel offered by [facility] and includes sequencing, rearrangement, and RNA analysis for the following 77 genes: AIP, ALK, APC, ATM, AXIN2, BAP1, BARD1, BLM, BMPR1A, BRCA1, BRCA2, BRIP1, CDC73, CDH1, CDK4, CDKN1B, CDKN2A, CHEK2, CTNNA1, DICER1, FANCC, FH, FLCN, GALNT12, KIF1B, LZTR1, MAX, MEN1, MET, MLH1, MSH2, MSH3, MSH6, MUTYH, NBN, NF1, NF2, NTHL1, PALB2, PHOX2B, PMS2, POT1, PRKAR1A, PTCH1, PTEN, RAD51C, RAD51D, RB1, RECQL, RET, SDHA, SDHAF2, SDHB, SDHC, SDHD, SMAD4, SMARCA4, SMARCB1, SMARCE1, STK11, SUFU, TMEM127, TP53, TSC1, TSC2, VHL and XRCC2 (sequencing and deletion/duplication); EGFR, EGLN1, HOXB13, KIT, MITF, PDGFRA, POLD1, and POLE (sequencing only); EPCAM and GREM1 (deletion/duplication only).    10/05/2022 Surgery   Right breast lumpectomy with Dr. 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  Grade 2, 2 sentinel lymph nodes negative for metastasis.  Margins negative.  T1c, N0.   10/05/2022 Cancer Staging   Staging form: Breast, AJCC 8th Edition - Pathologic stage from 10/05/2022: Stage IA (pT1c, pN0, cM0, G2, ER+, PR+, HER2+) - Signed by Loa Socks, NP on 11/08/2022 Stage prefix: Initial diagnosis Histologic grading system: 3 grade system   11/08/2022 -  Chemotherapy   Patient is on Treatment Plan : BREAST Paclitaxel + Trastuzumab q7d / Trastuzumab q21d     03/23/2023 - 04/19/2023 Radiation Therapy   Adjuvant radiation     CURRENT THERAPY: herceptin  INTERVAL HISTORY: Judy Morrison 72 y.o. female returns for f/u prior to receiving Herceptin therapy.  She completes adjuvant radiation this week and tells me she has tolerated it pretty well.  She denies any significant issues.     Patient Active Problem List   Diagnosis Date Noted  . Port-A-Cath in place 11/15/2022  . Genetic testing 10/04/2022  . 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 Malignant neoplasm of upper-outer  quadrant of right breast in female, estrogen receptor positive (HCC) 09/10/2022  . Rash and other nonspecific skin eruption 09/15/2020  . Allergic contact dermatitis 09/15/2020  . Pruritus 08/14/2020  . Dizziness 05/10/2018  . Atypical chest pain 05/10/2018  . Primary osteoarthritis of left knee 01/06/2018  . S/P knee replacement 08/12/2017  . Incomplete tear of right rotator cuff 05/19/2017  . Osteopenia 05/19/2017  . Urge incontinence of urine 05/19/2017  . Primary open angle glaucoma (POAG) 12/30/2016  . Primary osteoarthritis of both knees 12/30/2016  . Recurrent oral herpes simplex infection 12/30/2016  . Tubular adenoma of colon 12/30/2016  . Hypothyroidism 06/25/2016  . Right foot pain 12/19/2014  . Anxiety 05/21/2011  . Seasonal allergies 05/21/2011    is allergic to penicillin g, cephalexin, codeine, and erythromycin.  MEDICAL HISTORY: Past Medical History:  Diagnosis Date  . Anemia   . Anxiety   . Arthritis   . Depressed   . Dermatitis    rare form, on prednisone  . GERD (gastroesophageal reflux disease)    controlled with Omeprazole  . Glaucoma   . Torn rotator cuff    RIGHT   . Urinary tract infection    dx 05-26-17 took 1 week cipro BID completed 06-02-17.    SURGICAL HISTORY: Past Surgical History:  Procedure Laterality Date  . ADENOIDECTOMY    . APPENDECTOMY    . arthroscopic right knee     . BREAST BIOPSY Right 09/01/2022   MM RT BREAST BX W LOC DEV 1ST LESION IMAGE BX SPEC STEREO GUIDE 09/01/2022 [facility]MO[facility]  . BREAST BIOPSY  10/01/2022   MM RT RADIOACTIVE SEED LOC MAMMO GUIDE 10/01/2022 [facility] MAMMOGRAP[facility]  . BREAST LUMPECTOMY WITH RADIOACTIVE SEED AND SENTINEL LYMPH NODE BIOPSY Right 10/05/2022   Procedure: RIGHT BREAST LUMPECTOMY WITH RADIOACTIVE SEED AND AXILLARY SENTINEL LYMPH NODE BIOPSY;  Surgeon: [name], MD;  Location: [facility];  Service: General;  Laterality: Right;  . CHOLECYSTECTOMY    . COLONOSCOPY   06/06/2017   Pyrtle  . Pelvic sling     x2  . POLYPECTOMY    . PORT A CATH REVISION N/A 11/22/2022   Procedure: PORT A CATH REVISION;  Surgeon: [name], MD;  Location: [facility] OR;  Service: General;  Laterality: N/A;  . PORTACATH PLACEMENT Left 10/05/2022   Procedure: INSERTION PORT-A-CATH;  Surgeon: [name], MD;  Location: [facility];  Service: General;  Laterality: Left;  . ROTATOR CUFF REPAIR Right   . TONSILLECTOMY    . TOTAL KNEE ARTHROPLASTY Right 08/12/2017   Procedure: RIGHT TOTAL KNEE ARTHROPLASTY;  Surgeon: Eugenia Mcalpine, MD;  Location: [facility];  Service: Orthopedics;  Laterality: Right;  . TOTAL KNEE ARTHROPLASTY Left 01/06/2018   Procedure: LEFT TOTAL KNEE ARTHROPLASTY;  Surgeon: Eugenia Mcalpine, MD;  Location: [facility];  Service: Orthopedics;  Laterality: Left;  . UPPER GASTROINTESTINAL ENDOSCOPY    . WISDOM TOOTH EXTRACTION      SOCIAL HISTORY: Social History   Socioeconomic History  . Marital status: Single    Spouse name: Not on file  . Number of children: Not on file  . Years of education: Not on file  . Highest education level: Not on file  Occupational History  . Not on file  Tobacco Use  . Smoking status: Former    Current packs/day: 0.00    Types: Cigarettes    Quit date: 1986    Years  since quitting: 38.6  . Smokeless tobacco: Never  Vaping Use  . Vaping status: Never Used  Substance and Sexual Activity  . Alcohol use: Yes    Alcohol/week: 2.0 standard drinks of alcohol    Types: 2 Cans of beer per week  . Drug use: No  . Sexual activity: Not on file  Other Topics Concern  . Not on file  Social History Narrative  . Not on file   Social Determinants of Health   Financial Resource Strain: Low Risk  (04/11/2023)   Received from [facility]   Overall Financial Resource Strain (CARDIA)   . Difficulty of Paying Living Expenses: Not hard at all  Food Insecurity: No Food Insecurity (04/11/2023)   Received from [facility]   Hunger Vital Sign   . Worried About Programme researcher, broadcasting/film/video in the Last Year: Never true   . Ran Out of Food in the Last Year: Never true  Transportation Needs: No Transportation Needs (04/11/2023)   Received from [facility] - Transportation   . Lack of Transportation (Medical): No   . Lack of Transportation (Non-Medical): No  Physical Activity: Sufficiently Active (08/22/2022)   Received from [facility], [facility]   Exercise Vital Sign   . Days of Exercise per Week: 3 days   . Minutes of Exercise per Session: 50 min  Stress: No Stress Concern Present (07/28/2021)   Received from [facility], [facility] of Occupational Health - Occupational Stress Questionnaire   . Feeling of Stress : Not at all  Social Connections: Socially Integrated (08/22/2022)   Received from [facility], [facility]   Social Network   . How would you rate your social network (family, work, friends)?: Good participation with social networks  Intimate Partner Violence: Not At Risk (08/22/2022)   Received from [facility], [facility]   HITS   . Over the last 12 months how often did your partner physically hurt you?: 1   . Over the last 12 months how often did your partner insult you or talk down to you?: 1   . Over the last 12 months how often did your partner threaten you with physical harm?: 1   . Over the last 12 months how often did your partner scream or curse at you?: 1    FAMILY HISTORY: Family History  Problem Relation Age of Onset  . Bowel Disease Mother        ishemic bowel  . Lung cancer Mother        smoked  . Cervical cancer Paternal Aunt   . Cancer Maternal Grandfather        unknown type  . Lung cancer Paternal Grandfather        smoked  . Colon cancer Neg Hx   . Breast cancer Neg Hx   . Esophageal cancer Neg Hx   . Pancreatic cancer Neg Hx   . Prostate cancer Neg Hx   . Rectal cancer Neg Hx   . Stomach cancer Neg Hx   .  Allergic rhinitis Neg Hx   . Asthma Neg Hx   . Eczema Neg Hx   . Urticaria Neg Hx   . Colon polyps Neg Hx     Review of Systems - Oncology    PHYSICAL EXAMINATION   Onc Performance Status - 04/18/23 1426       ECOG Perf Status   ECOG Perf Status Fully active, able to carry  on all pre-disease performance without restriction      KPS SCALE   KPS % SCORE Able to carry on normal activity, minor s/s of disease             Vitals:   04/18/23 1421 04/18/23 1426  BP: (!) 146/67 136/75  Pulse: 88   Resp: 16   Temp: 97.6 F (36.4 C)   SpO2: 100%     Physical Exam  LABORATORY DATA:  CBC    Component Value Date/Time   WBC 6.4 03/28/2023 0948   WBC 10.1 10/01/2021 1314   RBC 3.83 (L) 03/28/2023 0948   HGB 10.1 (L) 03/28/2023 0948   HGB 13.7 09/15/2020 1209   HCT 30.9 (L) 03/28/2023 0948   HCT 41.3 09/15/2020 1209   PLT 325 03/28/2023 0948   PLT 349 09/15/2020 1209   MCV 80.7 03/28/2023 0948   MCV 86 09/15/2020 1209   MCH 26.4 03/28/2023 0948   MCHC 32.7 03/28/2023 0948   RDW 16.2 (H) 03/28/2023 0948   RDW 12.3 09/15/2020 1209   LYMPHSABS 1.2 03/28/2023 0948   LYMPHSABS 1.2 09/15/2020 1209   MONOABS 0.8 03/28/2023 0948   EOSABS 0.3 03/28/2023 0948   EOSABS 0.2 09/15/2020 1209   BASOSABS 0.1 03/28/2023 0948   BASOSABS 0.1 09/15/2020 1209    CMP     Component Value Date/Time   NA 138 03/28/2023 0948   NA 135 09/15/2020 1209   K 3.9 03/28/2023 0948   CL 104 03/28/2023 0948   CO2 28 03/28/2023 0948   GLUCOSE 83 03/28/2023 0948   BUN 14 03/28/2023 0948   BUN 14 09/15/2020 1209   CREATININE 0.85 03/28/2023 0948   CALCIUM 9.2 03/28/2023 0948   PROT 6.6 03/28/2023 0948   PROT 6.8 09/15/2020 1209   ALBUMIN 4.0 03/28/2023 0948   ALBUMIN 4.6 09/15/2020 1209   AST 16 03/28/2023 0948   ALT 15 03/28/2023 0948   ALKPHOS 94 03/28/2023 0948   BILITOT 0.4 03/28/2023 0948   GFRNONAA >60 03/28/2023 0948   GFRAA 74 09/15/2020 1209       PENDING  LABS:   RADIOGRAPHIC STUDIES:  No results found.   PATHOLOGY:     ASSESSMENT and THERAPY PLAN:   No problem-specific Assessment & Plan notes found for this encounter.   No orders of the defined types were placed in this encounter.   All questions were answered. The patient knows to call the clinic with any problems, questions or concerns. We can certainly see the patient much sooner if necessary. This note was electronically signed. Noreene Filbert, NP 04/18/2023

## 2023-04-18 NOTE — Patient Instructions (Signed)
[facility]  Discharge Instructions: Thank you for choosing [facility] to provide your oncology and hematology care.   If you have a lab appointment with the Cancer Center, please go directly to the Cancer Center and check in at the registration area.   Wear comfortable clothing and clothing appropriate for easy access to any Portacath or PICC line.   We strive to give you quality time with your provider. You may need to reschedule your appointment if you arrive late (15 or more minutes).  Arriving late affects you and other patients whose appointments are after yours.  Also, if you miss three or more appointments without notifying the office, you may be dismissed from the clinic at the provider's discretion.      For prescription refill requests, have your pharmacy contact our office and allow 72 hours for refills to be completed.    Today you received the following chemotherapy and/or immunotherapy agents: Trastuzumab      To help prevent nausea and vomiting after your treatment, we encourage you to take your nausea medication as directed.  BELOW ARE SYMPTOMS THAT SHOULD BE REPORTED IMMEDIATELY: *FEVER GREATER THAN 100.4 F (38 C) OR HIGHER *CHILLS OR SWEATING *NAUSEA AND VOMITING THAT IS NOT CONTROLLED WITH YOUR NAUSEA MEDICATION *UNUSUAL SHORTNESS OF BREATH *UNUSUAL BRUISING OR BLEEDING *URINARY PROBLEMS (pain or burning when urinating, or frequent urination) *BOWEL PROBLEMS (unusual diarrhea, constipation, pain near the anus) TENDERNESS IN MOUTH AND THROAT WITH OR WITHOUT PRESENCE OF ULCERS (sore throat, sores in mouth, or a toothache) UNUSUAL RASH, SWELLING OR PAIN  UNUSUAL VAGINAL DISCHARGE OR ITCHING   Items with * indicate a potential emergency and should be followed up as soon as possible or go to the Emergency Department if any problems should occur.  Please show the CHEMOTHERAPY ALERT CARD or IMMUNOTHERAPY ALERT CARD at  check-in to the Emergency Department and triage nurse.  Should you have questions after your visit or need to cancel or reschedule your appointment, please contact [facility]  Dept: [phone]  and follow the prompts.  Office hours are 8:00 a.m. to 4:30 p.m. Monday - Friday. Please note that voicemails left after 4:00 p.m. may not be returned until the following business day.  We are closed weekends and major holidays. You have access to a nurse at all times for urgent questions. Please call the main number to the clinic Dept: [phone] and follow the prompts.   For any non-urgent questions, you may also contact your provider using MyChart. We now offer e-Visits for anyone 18 and older to request care online for non-urgent symptoms. For details visit [email or web address].   Also download the MyChart app! Go to the app store, search "MyChart", open the app, select [facility], and log in with your MyChart username and password.  

## 2023-04-19 ENCOUNTER — Ambulatory Visit: Admission: RE | Admit: 2023-04-19 | Payer: Medicare Other | Source: Ambulatory Visit

## 2023-04-19 ENCOUNTER — Other Ambulatory Visit: Payer: Self-pay

## 2023-04-19 DIAGNOSIS — C50411 Malignant neoplasm of upper-outer quadrant of right female breast: Secondary | ICD-10-CM | POA: Diagnosis not present

## 2023-04-19 LAB — RAD ONC ARIA SESSION SUMMARY
Course Elapsed Days: 27
Plan Fractions Treated to Date: 4
Plan Prescribed Dose Per Fraction: 2 Gy
Plan Total Fractions Prescribed: 4
Plan Total Prescribed Dose: 8 Gy
Reference Point Dosage Given to Date: 8 Gy
Reference Point Session Dosage Given: 2 Gy
Session Number: 20

## 2023-04-20 ENCOUNTER — Other Ambulatory Visit: Payer: Self-pay

## 2023-04-20 NOTE — Radiation Completion Notes (Addendum)
  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   Intent: Curative     ==========DELIVERED PLANS==========  First Treatment Date: 2023-03-23 - Last Treatment Date: 2023-04-19   Plan Name: Breast_R Site: Breast, Right Technique: 3D Mode: Photon Dose Per Fraction: 2.66 Gy Prescribed Dose (Delivered / Prescribed): 42.56 Gy / 42.56 Gy Prescribed Fxs (Delivered / Prescribed): 16 / 16   Plan Name: Breast_R_Bst Site: Breast, Right Technique: 3D Mode: Photon Dose Per Fraction: 2 Gy Prescribed Dose (Delivered / Prescribed): 8 Gy / 8 Gy Prescribed Fxs (Delivered / Prescribed): 4 / 4     ==========ON TREATMENT VISIT DATES========== 2023-03-25, 2023-04-01, 2023-04-08, 2023-04-15   See weekly On Treatment Notes in Epic for details. The patient tolerated radiation. She developed fatigue and anticipated skin changes in the treatment field.   The patient will receive a call in about one month from the radiation oncology department. She will continue follow up with Dr. Al Pimple as well.      Osker Mason, PAC

## 2023-04-21 ENCOUNTER — Encounter: Payer: Self-pay | Admitting: Hematology and Oncology

## 2023-04-21 NOTE — Assessment & Plan Note (Signed)
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  Treatment Plan:  Breast conserving surgery with sentinel node biopsy Adjuvant chemotherapy with Taxol/Herceptin followed by Herceptin maintenance Adjuvant radiation therapy Adjuvant antiestrogen therapy __________________________________________ Current treatment: Herceptin  Judy Morrison will continue to receive maintenance Herceptin every 3 weeks.  She is tolerating this well.   At risk for heart failure: Her most recent echo was low normal at 50 to 55%.  She will continue to be followed closely by cardiology. She has been cleared to continue treatment. She is due to start antiestrogen therapy with Anastrozole.  We briefly discussed this.  Her most recent bone density occurred in 06/2020.  I placed orders for repeat bone density testing. Memory changes: I placed a referral to neuro-oncology where she can meet with Dr. Barbaraann Cao.  They can discuss the chronic changes on the MRI along with her memory changes and the potential etiology.  RTC every 3 weeks for f/u and Herceptin.

## 2023-04-25 ENCOUNTER — Ambulatory Visit (HOSPITAL_BASED_OUTPATIENT_CLINIC_OR_DEPARTMENT_OTHER)
Admission: RE | Admit: 2023-04-25 | Discharge: 2023-04-25 | Disposition: A | Discharge status: Home / Self Care | Payer: Medicare Other | Source: Ambulatory Visit | Attending: Adult Health | Admitting: Adult Health

## 2023-04-25 DIAGNOSIS — E039 Hypothyroidism, unspecified: Secondary | ICD-10-CM | POA: Diagnosis not present

## 2023-04-25 DIAGNOSIS — Z13820 Encounter for screening for osteoporosis: Secondary | ICD-10-CM | POA: Insufficient documentation

## 2023-04-25 DIAGNOSIS — Z780 Asymptomatic menopausal state: Secondary | ICD-10-CM | POA: Diagnosis not present

## 2023-04-25 DIAGNOSIS — Z853 Personal history of malignant neoplasm of breast: Secondary | ICD-10-CM | POA: Diagnosis not present

## 2023-04-25 DIAGNOSIS — Z79811 Long term (current) use of aromatase inhibitors: Secondary | ICD-10-CM | POA: Insufficient documentation

## 2023-05-02 ENCOUNTER — Encounter: Payer: Self-pay | Admitting: Hematology and Oncology

## 2023-05-02 ENCOUNTER — Inpatient Hospital Stay (HOSPITAL_BASED_OUTPATIENT_CLINIC_OR_DEPARTMENT_OTHER): Payer: Medicare Other | Admitting: Internal Medicine

## 2023-05-02 VITALS — BP 131/67 | HR 98 | Temp 97.6°F | Resp 20 | Wt 166.9 lb

## 2023-05-02 DIAGNOSIS — C50411 Malignant neoplasm of upper-outer quadrant of right female breast: Secondary | ICD-10-CM | POA: Insufficient documentation

## 2023-05-02 DIAGNOSIS — Z8049 Family history of malignant neoplasm of other genital organs: Secondary | ICD-10-CM | POA: Diagnosis not present

## 2023-05-02 DIAGNOSIS — R4189 Other symptoms and signs involving cognitive functions and awareness: Secondary | ICD-10-CM | POA: Insufficient documentation

## 2023-05-02 DIAGNOSIS — Z801 Family history of malignant neoplasm of trachea, bronchus and lung: Secondary | ICD-10-CM | POA: Insufficient documentation

## 2023-05-02 DIAGNOSIS — Z170 Estrogen receptor positive status [ER+]: Secondary | ICD-10-CM | POA: Insufficient documentation

## 2023-05-02 DIAGNOSIS — R42 Dizziness and giddiness: Secondary | ICD-10-CM

## 2023-05-02 DIAGNOSIS — Z5112 Encounter for antineoplastic immunotherapy: Secondary | ICD-10-CM | POA: Insufficient documentation

## 2023-05-02 DIAGNOSIS — Z87891 Personal history of nicotine dependence: Secondary | ICD-10-CM | POA: Insufficient documentation

## 2023-05-02 NOTE — Progress Notes (Signed)
[facility] at [facility] [address]  [place], [place] [address] [phone]   Cognitive Survivorship Evaluation  Date of Service: 05/02/23 Patient Name: LENNI HNAT Patient MRN: [id number] Patient DOB: 1950-11-22 Provider: Henreitta Leber, MD  Identifying Statement:  CARLETTE DARK is a 72 y.o. female who presents for initial consultation and evaluation regarding cancer associated cognitive decline.    Referring Provider: Loa Socks, NP [address] [place],  [place] [address]  Primary Cancer:  Oncologic History: Oncology History  Malignant neoplasm of upper-outer quadrant of right breast in female, estrogen receptor positive (HCC)  08/02/2022 Mammogram   In the right breast, possible distortion warrants further evaluation. In the left breast, no findings suspicious for malignancy. Diagnostic mammogram showed persistent subtle distortion central slightly LATERAL aspect of the RIGHT breast warranting tissue diagnosis.     09/01/2022 Pathology Results   Final pathology showed grade 2 invasive mammary carcinoma, prognostic showed ER 90% positive strong staining PR 90% positive strong staining, Ki-67 of 5% and HER2 3+ by [facility]   09/10/2022 Initial Diagnosis   Malignant neoplasm of upper-outer quadrant of right breast in female, estrogen receptor positive (HCC)    Genetic Testing   [facility] CancerNext-Expanded Panel+RNA was Positive. 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 Report date is 10/04/2022.  The CancerNext-Expanded gene panel offered by [facility] and includes sequencing, rearrangement, and RNA analysis for the following 77 genes: AIP, ALK, APC, ATM, AXIN2, BAP1, BARD1, BLM, BMPR1A, BRCA1, BRCA2, BRIP1, CDC73, CDH1, CDK4, CDKN1B, CDKN2A, CHEK2, CTNNA1, DICER1, FANCC, FH, FLCN, GALNT12, KIF1B, LZTR1, MAX,  MEN1, MET, MLH1, MSH2, MSH3, MSH6, MUTYH, NBN, NF1, NF2, NTHL1, PALB2, PHOX2B, PMS2, POT1, PRKAR1A, PTCH1, PTEN, RAD51C, RAD51D, RB1, RECQL, RET, SDHA, SDHAF2, SDHB, SDHC, SDHD, SMAD4, SMARCA4, SMARCB1, SMARCE1, STK11, SUFU, TMEM127, TP53, TSC1, TSC2, VHL and XRCC2 (sequencing and deletion/duplication); EGFR, EGLN1, HOXB13, KIT, MITF, PDGFRA, POLD1, and POLE (sequencing only); EPCAM and GREM1 (deletion/duplication only).    10/05/2022 Surgery   Right breast lumpectomy with Dr. 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  Grade 2, 2 sentinel lymph nodes negative for metastasis.  Margins negative.  T1c, N0.   10/05/2022 Cancer Staging   Staging form: Breast, AJCC 8th Edition - Pathologic stage from 10/05/2022: Stage IA (pT1c, pN0, cM0, G2, ER+, PR+, HER2+) - Signed by Loa Socks, NP on 11/08/2022 Stage prefix: Initial diagnosis Histologic grading system: 3 grade system   11/08/2022 -  Chemotherapy   Patient is on Treatment Plan : BREAST Paclitaxel + Trastuzumab q7d / Trastuzumab q21d     03/23/2023 - 04/19/2023 Radiation Therapy   Adjuvant radiation     History of Present Illness: The patient's records from the referring physician were obtained and reviewed and the patient interviewed to confirm this HPI.  Eulis Foster Menard presents today to discuss cognitive changes since beginning treatment for breast cancer.  She describes modest impairment in attention, processing speed and short term memory.  There is increased anxiety and mood lability as well. It is more difficult for her to function at home because of cognitive issues.  Also complains of chronic dizziness, imbalance which has worsened since she started chemo.  Not requiring gait assistance at this time.  She is currently on herceptic infusions with Dr. Pamelia Hoit, chemo has been well tolerated overall.  Otherwise denies focal complaints, no seizures, headaches.  Medications:  Current Outpatient Medications on  File Prior to Visit  Medication Sig Dispense Refill   Adapalene 0.3 % gel Apply 1 application daily as needed topically (for acne).     ALPRAZolam (XANAX) 0.25 MG tablet Take 0.25 mg by mouth daily as needed for anxiety.   1   brexpiprazole (REXULTI) 2 MG TABS tablet Take 2 mg by mouth daily after breakfast.     cephALEXin (KEFLEX) 500 MG capsule Take 500 mg by mouth 2 (two) times daily.     clobetasol cream (TEMOVATE) 0.05 % Apply 1 Application topically 2 (two) times daily as needed (irritation).     clonazePAM (KLONOPIN) 0.5 MG tablet Take 0.5 mg by mouth 2 (two) times daily.     clotrimazole (MYCELEX) 10 MG troche Take 10 mg by mouth as directed. 2 times per week     DULoxetine (CYMBALTA) 20 MG capsule Take 20 mg by mouth daily.     furosemide (LASIX) 20 MG tablet Take 1 tablet (20 mg total) by mouth as needed. 90 tablet 3   hydrOXYzine (VISTARIL) 25 MG capsule Take 25-50 mg by mouth daily.     latanoprost (XALATAN) 0.005 % ophthalmic solution Place 1 drop into both eyes at bedtime.     levothyroxine (SYNTHROID, LEVOTHROID) 50 MCG tablet Take 50 mcg at bedtime by mouth.      lidocaine-prilocaine (EMLA) cream Apply 1 Application to affected area topically once daily. 30 g 3   loratadine (CLARITIN) 10 MG tablet Take 10 mg by mouth daily.     nortriptyline (PAMELOR) 50 MG capsule Take 100 mg at bedtime by mouth.      omeprazole (PRILOSEC) 40 MG capsule Take 40 mg by mouth daily before breakfast.     ondansetron (ZOFRAN) 8 MG tablet Take 1 tablet (8 mg total) by mouth every 8 (eight) hours as needed for nausea or vomiting. 30 tablet 1   OVER THE COUNTER MEDICATION 20 mLs by Mouth Rinse route 4 (four) times daily as needed (mouth pain). Oral-B Sore Mouth Rinse     phenazopyridine (PYRIDIUM) 200 MG tablet Take 200 mg by mouth 3 (three) times daily as needed.     prochlorperazine (COMPAZINE) 10 MG tablet Take 1 tablet (10 mg total) by mouth every 6 (six) hours as needed for nausea or vomiting. 30  tablet 1   simvastatin (ZOCOR) 10 MG tablet Take 10 mg by mouth daily.     tacrolimus (PROGRAF) 1 MG capsule Take 1 mg by mouth 2 (two) times daily. Per patient - Dissolve capsule in one half liter of H2O. Swish and spit twice daily     triamcinolone cream (KENALOG) 0.1 % Apply 1 Application topically 2 (two) times daily as needed (irritation).     valACYclovir (VALTREX) 1000 MG tablet Take 1,000 mg by mouth daily as needed (outbreaks).     Current Facility-Administered Medications on File Prior to Visit  Medication Dose Route Frequency Provider Last Rate Last Admin   sodium chloride flush (NS) 0.9 % injection 10 mL  10 mL Intracatheter PRN Rachel Moulds, MD        Allergies:  Allergies  Allergen Reactions   Penicillin G Anaphylaxis   Cephalexin Other (See Comments)    Pt does not remember reaction    Codeine Nausea Only   Erythromycin Itching   Past Medical History:  Past Medical History:  Diagnosis Date   Anemia    Anxiety    Arthritis    Depressed    Dermatitis  rare form, on prednisone   GERD (gastroesophageal reflux disease)    controlled with Omeprazole   Glaucoma    Torn rotator cuff    RIGHT    Urinary tract infection    dx 05-26-17 took 1 week cipro BID completed 06-02-17.   Past Surgical History:  Past Surgical History:  Procedure Laterality Date   ADENOIDECTOMY     APPENDECTOMY     arthroscopic right knee      BREAST BIOPSY Right 09/01/2022   MM RT BREAST BX W LOC DEV 1ST LESION IMAGE BX SPEC STEREO GUIDE 09/01/2022 [facility]   BREAST BIOPSY  10/01/2022   MM RT RADIOACTIVE SEED LOC MAMMO GUIDE 10/01/2022 [facility]   BREAST LUMPECTOMY WITH RADIOACTIVE SEED AND SENTINEL LYMPH NODE BIOPSY Right 10/05/2022   Procedure: RIGHT BREAST LUMPECTOMY WITH RADIOACTIVE SEED AND AXILLARY SENTINEL LYMPH NODE BIOPSY;  Surgeon: [name], MD;  Location: [facility];  Service: General;  Laterality: Right;   CHOLECYSTECTOMY      COLONOSCOPY  06/06/2017   Pyrtle   Pelvic sling     x2   POLYPECTOMY     PORT A CATH REVISION N/A 11/22/2022   Procedure: PORT A CATH REVISION;  Surgeon: [name], MD;  Location: [facility] OR;  Service: General;  Laterality: N/A;   PORTACATH PLACEMENT Left 10/05/2022   Procedure: INSERTION PORT-A-CATH;  Surgeon: [name], MD;  Location:  SURGERY CENTER;  Service: General;  Laterality: Left;   ROTATOR CUFF REPAIR Right    TONSILLECTOMY     TOTAL KNEE ARTHROPLASTY Right 08/12/2017   Procedure: RIGHT TOTAL KNEE ARTHROPLASTY;  Surgeon: Eugenia Mcalpine, MD;  Location: [facility];  Service: Orthopedics;  Laterality: Right;   TOTAL KNEE ARTHROPLASTY Left 01/06/2018   Procedure: LEFT TOTAL KNEE ARTHROPLASTY;  Surgeon: Eugenia Mcalpine, MD;  Location: [facility];  Service: Orthopedics;  Laterality: Left;   UPPER GASTROINTESTINAL ENDOSCOPY     WISDOM TOOTH EXTRACTION     Social History:  Social History   Socioeconomic History   Marital status: Single    Spouse name: Not on file   Number of children: Not on file   Years of education: Not on file   Highest education level: Not on file  Occupational History   Not on file  Tobacco Use   Smoking status: Former    Current packs/day: 0.00    Types: Cigarettes    Quit date: 1986    Years since quitting: 38.6   Smokeless tobacco: Never  Vaping Use   Vaping status: Never Used  Substance and Sexual Activity   Alcohol use: Yes    Alcohol/week: 2.0 standard drinks of alcohol    Types: 2 Cans of beer per week   Drug use: No   Sexual activity: Not on file  Other Topics Concern   Not on file  Social History Narrative   Not on file   Social Determinants of Health   Financial Resource Strain: Low Risk  (04/11/2023)   Received from [facility]   Overall Financial Resource Strain (CARDIA)    Difficulty of Paying Living Expenses: Not hard at all  Food Insecurity: No Food Insecurity (04/11/2023)   Received from [facility]    Hunger Vital Sign    Worried About Running Out of Food in the Last Year: Never true    Ran Out of Food in the Last Year: Never true  Transportation Needs: No Transportation Needs (04/11/2023)   Received from [facility]  PRAPARE - Administrator, Civil Service (Medical): No    Lack of Transportation (Non-Medical): No  Physical Activity: Sufficiently Active (08/22/2022)   Received from [facility], [facility]   Exercise Vital Sign    Days of Exercise per Week: 3 days    Minutes of Exercise per Session: 50 min  Stress: No Stress Concern Present (07/28/2021)   Received from [facility], [facility] of Occupational Health - Occupational Stress Questionnaire    Feeling of Stress : Not at all  Social Connections: Socially Integrated (08/22/2022)   Received from [facility], [facility]   Social Network    How would you rate your social network (family, work, friends)?: Good participation with social networks  Intimate Partner Violence: Not At Risk (08/22/2022)   Received from [facility]   HITS    Over the last 12 months how often did your partner physically hurt you?: 1    Over the last 12 months how often did your partner insult you or talk down to you?: 1    Over the last 12 months how often did your partner threaten you with physical harm?: 1    Over the last 12 months how often did your partner scream or curse at you?: 1   Family History:  Family History  Problem Relation Age of Onset   Bowel Disease Mother        ishemic bowel   Lung cancer Mother        smoked   Cervical cancer Paternal Aunt    Cancer Maternal Grandfather        unknown type   Lung cancer Paternal Grandfather        smoked   Colon cancer Neg Hx    Breast cancer Neg Hx    Esophageal cancer Neg Hx    Pancreatic cancer Neg Hx    Prostate cancer Neg Hx    Rectal cancer Neg Hx    Stomach cancer Neg Hx    Allergic rhinitis Neg Hx    Asthma Neg  Hx    Eczema Neg Hx    Urticaria Neg Hx    Colon polyps Neg Hx     Review of Systems: Constitutional: Doesn't report fevers, chills or abnormal weight loss Eyes: Doesn't report blurriness of vision Ears, nose, mouth, throat, and face: Doesn't report sore throat Respiratory: Doesn't report cough, dyspnea or wheezes Cardiovascular: Doesn't report palpitation, chest discomfort  Gastrointestinal:  Doesn't report nausea, constipation, diarrhea GU: Doesn't report incontinence Skin: Doesn't report skin rashes Neurological: Per HPI Musculoskeletal: Doesn't report joint pain Behavioral/Psych: +anxiety  Physical Exam: Vitals:   05/02/23 1350  BP: 131/67  Pulse: 98  Resp: 20  Temp: 97.6 F (36.4 C)  SpO2: 98%   General: Alert, cooperative, pleasant, in no acute distress Head: Normal EENT: No conjunctival injection or scleral icterus.  Lungs: Resp effort normal Cardiac: Regular rate Abdomen: Non-distended abdomen Skin: No rashes cyanosis or petechiae. Extremities: No clubbing or edema  Neurologic Exam: Mental Status: Awake, alert, attentive to examiner. Oriented to self and environment. Language is fluent with intact comprehension.  Cranial Nerves: Visual acuity is grossly normal. Visual fields are full. Extra-ocular movements intact. No ptosis. Face is symmetric Motor: Tone and bulk are normal. Power is full in both arms and legs.  Sensory: Stocking neuropathy Gait: Normal.   Labs: I have reviewed the data as listed    Component Value Date/Time   NA  138 03/28/2023 0948   NA 135 09/15/2020 1209   K 3.9 03/28/2023 0948   CL 104 03/28/2023 0948   CO2 28 03/28/2023 0948   GLUCOSE 83 03/28/2023 0948   BUN 14 03/28/2023 0948   BUN 14 09/15/2020 1209   CREATININE 0.85 03/28/2023 0948   CALCIUM 9.2 03/28/2023 0948   PROT 6.6 03/28/2023 0948   PROT 6.8 09/15/2020 1209   ALBUMIN 4.0 03/28/2023 0948   ALBUMIN 4.6 09/15/2020 1209   AST 16 03/28/2023 0948   ALT 15 03/28/2023  0948   ALKPHOS 94 03/28/2023 0948   BILITOT 0.4 03/28/2023 0948   GFRNONAA >60 03/28/2023 0948   GFRAA 74 09/15/2020 1209   Lab Results  Component Value Date   WBC 6.4 03/28/2023   NEUTROABS 4.0 03/28/2023   HGB 10.1 (L) 03/28/2023   HCT 30.9 (L) 03/28/2023   MCV 80.7 03/28/2023   PLT 325 03/28/2023   Imaging:   DG Bone Density  Result Date: 04/25/2023 EXAM: DUAL X-RAY ABSORPTIOMETRY (DXA) FOR BONE MINERAL DENSITY IMPRESSION: Patient: Javier Glazier Referring Physician: Loa Socks Birth Date: [birth year] [age] Patient ID: [id number] Height: 63.0 in. Weight: 166.2 lbs. Measured: 04/25/2023 10:52:18 AM (18 SP 3) Sex: Female Ethnicity: White Analyzed: 04/25/2023 10:52:57 AM (18 SP 3) FRAX* Based on femoral neck BMD: DualFemur (Right) 10-year Probability of Fracture Major Osteoporotic Fracture: 15.5 % Hip Fracture:                4.5 % Population:                  [place] (Caucasian) Risk Factors:                None *FRAX is a Armed forces logistics/support/administrative officer of the [employer] for Metabolic Bone Diseases, a World Science writer (WHO) [employer] [phone]). Referring Physician:  Loa Socks Your patient completed a bone mineral density test using GE Lunar iDXA system (analysis version: 16). Technologist: ALW PATIENT: Name: Deneisha, Burkholder Patient ID: [id number] Birth Date: 1950/12/29 Height: 63.0 in. Sex: Female Measured: 04/25/2023 Weight: 166.2 lbs. Indications: Advanced Age, Breast Cancer History, Caucasian, Chemo, Estrogen Deficiency, Height Loss, Hypothyroid, Klonopin, Levothyroxine, Omeprazole, Post Menopausal Fractures: Treatments: ASSESSMENT: The BMD measured at DualFemur Neck Right is 0.701 g/cm2 with a T-score of -2.4. This patient is considered to have osteopenia/low bone mass according to World Health Organization [facility]) criteria. The scan quality is good. L-4 was excluded due to degenerative changes. Site Region  Measured Date Measured Age YA BMD Significant CHANGE T-score DualFemur Neck Right 04/25/2023    [age]         -2.4    0.701 g/cm2 AP Spine  L1-L3      04/25/2023    [age]         -1.9    0.954 g/cm2 DualFemur Total Mean 04/25/2023    [age]         -1.9    0.766 g/cm2 World Health Organization [facility]) criteria for post-menopausal, Caucasian Women: Normal       T-score at or above -1 SD Osteopenia   T-score between -1 and -2.5 SD Osteoporosis T-score at or below -2.5 SD RECOMMENDATION: 1. All patients should optimize calcium and vitamin D intake. 2. Consider FDA approved medical therapies in postmenopausal women and men aged 31 years and older, based on the following: a. A hip or vertebral (clinical or morphometric) fracture b. T-score = -  2.5 at the femoral neck or spine after appropriate evaluation to exclude secondary causes c. Low bone mass (T-score between -1.0 and -2.5 at the femoral neck or spine) and a 10- year probability of a hip fracture = 3% or a 10 year probability of a major osteoporosis-related fracture = 20% based on the [place]-adapted WHO algorithm. 3. Clinician judgement and/or patient preference may indicate treatment for people with 10-year fracture probabilities above or below these levels. FOLLOW-UP: Patients with diagnosis of osteoporosis or at high risk for fracture should have regular bone mineral density tests . For patients eligible for Medicare routine testing is allowed once every 2 years. The testing frequency can be increased to one year for patients who have rapidly progressing disease, those who are receiving or discontinuing medical therapy to restore bone mass, or have additional risk factors. I have reviewed this study and agree with the findings. [facility] Electronically Signed   By: Frederico Hamman M.D.   On: 04/25/2023 10:46   ECHOCARDIOGRAM COMPLETE  Result Date: 04/06/2023    ECHOCARDIOGRAM REPORT   Patient Name:   ELEYAH HANEL Date of Exam: 04/05/2023  Medical Rec #:  [id number]           Height:       63.0 in Accession #:    [id number]          Weight:       169.8 lb Date of Birth:  October 24, 1950           BSA:          1.804 m Patient Age:    72 years            BP:           132/72 mmHg Patient Gender: F                   HR:           91 bpm. Exam Location:  Outpatient Procedure: 2D Echo, 3D Echo, Cardiac Doppler, Color Doppler and Strain Analysis Indications:     I50.40* Unspecified combined systolic (congestive) and                  diastolic (congestive) heart failure  History:         Patient has prior history of Echocardiogram examinations, most                  recent 02/14/2023. Abnormal ECG, Aortic Valve Disease,                  Arrythmias:LBBB; Signs/Symptoms:Dizziness/Lightheadedness and                  Chest Pain. Breast cancer. Chemo.  Sonographer:     Sheralyn Boatman RDCS Referring Phys:  [id number] Dorthula Nettles Diagnosing Phys: Weston Brass MD IMPRESSIONS  1. Left ventricular ejection fraction, by estimation, is 50 to 55%. Left ventricular ejection fraction by 2D MOD biplane is 51.0 %. The left ventricle has low normal function. The left ventricle has no regional wall motion abnormalities. There is mild left ventricular hypertrophy. Left ventricular diastolic parameters are consistent with Grade I diastolic dysfunction (impaired relaxation). The average left ventricular global longitudinal strain is -19.9 %. The global longitudinal strain is normal.  2. Right ventricular systolic function is normal. The right ventricular size is normal. Tricuspid regurgitation signal is inadequate for assessing PA pressure.  3. The mitral valve is degenerative. Trivial mitral valve regurgitation. No  evidence of mitral stenosis.  4. The aortic valve is abnormal. There is mild calcification of the aortic valve. Aortic valve regurgitation is mild. Aortic valve sclerosis is present, with no evidence of aortic valve stenosis.  5. The inferior vena cava is normal in size  with greater than 50% respiratory variability, suggesting right atrial pressure of 3 mmHg. FINDINGS  Left Ventricle: Left ventricular ejection fraction, by estimation, is 50 to 55%. Left ventricular ejection fraction by 2D MOD biplane is 51.0 %. The left ventricle has low normal function. The left ventricle has no regional wall motion abnormalities. The average left ventricular global longitudinal strain is -19.9 %. The global longitudinal strain is normal. 3D ejection fraction reviewed and evaluated as part of the interpretation. Alternate measurement of EF is felt to be most reflective of LV function. The left ventricular internal cavity size was normal in size. There is mild left ventricular hypertrophy. Abnormal (paradoxical) septal motion, consistent with left bundle branch block. Left ventricular diastolic parameters are consistent with Grade I diastolic dysfunction (impaired relaxation). Right Ventricle: The right ventricular size is normal. No increase in right ventricular wall thickness. Right ventricular systolic function is normal. Tricuspid regurgitation signal is inadequate for assessing PA pressure. Left Atrium: Left atrial size was normal in size. Right Atrium: Right atrial size was normal in size. Pericardium: There is no evidence of pericardial effusion. Mitral Valve: The mitral valve is degenerative in appearance. Mild mitral annular calcification. Trivial mitral valve regurgitation. No evidence of mitral valve stenosis. Tricuspid Valve: The tricuspid valve is normal in structure. Tricuspid valve regurgitation is trivial. No evidence of tricuspid stenosis. Aortic Valve: The aortic valve is abnormal. There is mild calcification of the aortic valve. Aortic valve regurgitation is mild. Aortic regurgitation PHT measures 864 msec. Aortic valve sclerosis is present, with no evidence of aortic valve stenosis. Pulmonic Valve: The pulmonic valve was normal in structure. Pulmonic valve regurgitation is not  visualized. No evidence of pulmonic stenosis. Aorta: The aortic root and ascending aorta are structurally normal, with no evidence of dilitation. Venous: The inferior vena cava is normal in size with greater than 50% respiratory variability, suggesting right atrial pressure of 3 mmHg. IAS/Shunts: No atrial level shunt detected by color flow Doppler.  LEFT VENTRICLE PLAX 2D                        Biplane EF (MOD) LVIDd:         3.90 cm         LV Biplane EF:   Left LVIDs:         2.60 cm                          ventricular LV PW:         1.10 cm                          ejection LV IVS:        0.90 cm                          fraction by LVOT diam:     2.10 cm                          2D MOD LV SV:         69  biplane is LV SV Index:   38                               51.0 %. LVOT Area:     3.46 cm                                Diastology                                LV e' medial:    8.27 cm/s LV Volumes (MOD)               LV E/e' medial:  9.1 LV vol d, MOD    104.0 ml      LV e' lateral:   12.60 cm/s A2C:                           LV E/e' lateral: 6.0 LV vol d, MOD    89.3 ml A4C:                           2D LV vol s, MOD    47.5 ml       Longitudinal A2C:                           Strain LV vol s, MOD    45.0 ml       2D Strain GLS  -19.9 % A4C:                           Avg: LV SV MOD A2C:   56.5 ml LV SV MOD A4C:   89.3 ml LV SV MOD BP:    51.1 ml                                3D Volume EF:                                3D EF:        47 %                                LV EDV:       119 ml                                LV ESV:       63 ml                                LV SV:        56 ml RIGHT VENTRICLE             IVC RV S prime:     12.60 cm/s  IVC diam: 1.30 cm TAPSE (M-mode): 1.3 cm LEFT ATRIUM             Index  RIGHT ATRIUM           Index LA diam:        2.50 cm 1.39 cm/m   RA Area:     15.70 cm LA Vol (A2C):   34.1 ml 18.91 ml/m  RA Volume:   37.70 ml   20.90 ml/m LA Vol (A4C):   27.8 ml 15.41 ml/m LA Biplane Vol: 33.3 ml 18.46 ml/m  AORTIC VALVE LVOT Vmax:   117.00 cm/s LVOT Vmean:  78.700 cm/s LVOT VTI:    0.198 m AI PHT:      864 msec  AORTA Ao Root diam: 2.90 cm Ao Asc diam:  3.30 cm MITRAL VALVE MV Area (PHT): 5.13 cm    SHUNTS MV Decel Time: 148 msec    Systemic VTI:  0.20 m MV E velocity: 75.45 cm/s  Systemic Diam: 2.10 cm MV A velocity: 76.80 cm/s MV E/A ratio:  0.98 Weston Brass MD Electronically signed by Weston Brass MD Signature Date/Time: 04/06/2023/4:14:32 PM    Final (Updated)     Assessment/Plan:  Cognitive Changes  Eulis Foster Welty presents with 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  Fortunately, she is still living independently and functioning with ADLs without assistance.   We reviewed brain MRI which demonstrates white matter disease and atrophy when compared to 2019 study.    We provided counseling regarding healthy behaviors to maintain cognitive function, including exercise, diet, and positive outlook.  We discussed mindful relaxation and provided some methods to redirect anxiety without medication.   No further CNS workup or cognitive screening recommended at this time.  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  Neuropathy may be painless and affect mainly propiception in some patients.  We spent twenty additional minutes teaching regarding the natural history, biology, and historical experience in the treatment of neurologic complications of cancer.   We appreciate the opportunity to participate in the care of [employer].  We encouraged follow up for progressive cognitive issues or neurologic symptoms if they develop.  All questions were answered. The patient knows to call the clinic with any problems, questions or concerns. No barriers to learning  were detected.  The total time spent in the encounter was 40 minutes and more than 50% was on counseling and review of test results   Henreitta Leber, MD Medical Director of Neuro-Oncology [facility] [place] 05/02/23 2:51 PM

## 2023-05-09 ENCOUNTER — Inpatient Hospital Stay: Payer: Medicare Other | Admitting: Hematology and Oncology

## 2023-05-09 ENCOUNTER — Inpatient Hospital Stay: Payer: Medicare Other

## 2023-05-09 VITALS — BP 136/70 | HR 88 | Temp 97.8°F | Resp 16 | Wt 164.2 lb

## 2023-05-09 DIAGNOSIS — C50411 Malignant neoplasm of upper-outer quadrant of right female breast: Secondary | ICD-10-CM

## 2023-05-09 DIAGNOSIS — Z170 Estrogen receptor positive status [ER+]: Secondary | ICD-10-CM

## 2023-05-09 DIAGNOSIS — Z5112 Encounter for antineoplastic immunotherapy: Secondary | ICD-10-CM | POA: Diagnosis not present

## 2023-05-09 MED ORDER — SODIUM CHLORIDE 0.9 % IV SOLN: Freq: Once | INTRAVENOUS | Status: AC

## 2023-05-09 MED ORDER — ACETAMINOPHEN 325 MG PO TABS
650.0000 mg | ORAL_TABLET | Freq: Once | ORAL | Status: AC
  Administered 2023-05-09: 650 mg via ORAL
  Filled 2023-05-09: qty 2

## 2023-05-09 MED ORDER — ANASTROZOLE 1 MG PO TABS: 1.0000 mg | ORAL_TABLET | Freq: Every day | ORAL | 3 refills | Status: DC

## 2023-05-09 MED ORDER — HEPARIN SOD (PORK) LOCK FLUSH 100 UNIT/ML IV SOLN
500.0000 [IU] | Freq: Once | INTRAVENOUS | Status: AC | PRN
  Administered 2023-05-09: 500 [IU]

## 2023-05-09 MED ORDER — TRASTUZUMAB-DTTB CHEMO 150 MG IV SOLR
6.0000 mg/kg | Freq: Once | INTRAVENOUS | Status: AC
  Administered 2023-05-09: 420 mg via INTRAVENOUS
  Filled 2023-05-09: qty 20

## 2023-05-09 MED ORDER — SODIUM CHLORIDE 0.9% FLUSH
10.0000 mL | INTRAVENOUS | Status: DC | PRN
  Administered 2023-05-09: 10 mL

## 2023-05-09 MED ORDER — DIPHENHYDRAMINE HCL 25 MG PO CAPS
50.0000 mg | ORAL_CAPSULE | Freq: Once | ORAL | Status: AC
  Administered 2023-05-09: 50 mg via ORAL
  Filled 2023-05-09: qty 2

## 2023-05-09 NOTE — Assessment & Plan Note (Addendum)
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  Treatment Plan:  Breast conserving surgery with sentinel node biopsy Adjuvant chemotherapy with Taxol/Herceptin followed by Herceptin maintenance Adjuvant radiation therapy Adjuvant antiestrogen therapy __________________________________________ Current treatment: Herceptin  [name] will continue to receive maintenance Herceptin every 3 weeks.  She is tolerating this well.   At risk for heart failure: Her most recent echo was low normal at 50 to 55%.  She will continue to be followed closely by cardiology. She has been cleared to continue treatment. Memory changes: she followed up with Dr Barbaraann Cao. She hasn't started anastrozole yet, prescription sent, encouraged ca/vit D and weight bearing exercises.  Baseline bone density showed osteopenia heading to osteoporosis.  RTC every 3 weeks for f/u and Herceptin.

## 2023-05-09 NOTE — Patient Instructions (Signed)

## 2023-05-09 NOTE — Progress Notes (Signed)
[facility] Cancer Follow up:    Medicine, [facility] [address] [place] [address]   DIAGNOSIS:  Cancer Staging  Malignant neoplasm of upper-outer quadrant of right breast in female, estrogen receptor positive (HCC) Staging form: Breast, AJCC 8th Edition - Clinical stage from 09/15/2022: Stage IA (cT1c, cN0, cM0, G2, ER+, PR+, HER2+) - Unsigned Stage prefix: Initial diagnosis Histologic grading system: 3 grade system Laterality: Right Staged by: Pathologist and managing physician Stage used in treatment planning: Yes National guidelines used in treatment planning: Yes Type of national guideline used in treatment planning: NCCN - Pathologic stage from 10/05/2022: Stage IA (pT1c, pN0, cM0, G2, ER+, PR+, HER2+) - Signed by Loa Socks, NP on 11/08/2022 Stage prefix: Initial diagnosis Histologic grading system: 3 grade system   SUMMARY OF ONCOLOGIC HISTORY: Oncology History  Malignant neoplasm of upper-outer quadrant of right breast in female, estrogen receptor positive (HCC)  08/02/2022 Mammogram   In the right breast, possible distortion warrants further evaluation. In the left breast, no findings suspicious for malignancy. Diagnostic mammogram showed persistent subtle distortion central slightly LATERAL aspect of the RIGHT breast warranting tissue diagnosis.     09/01/2022 Pathology Results   Final pathology showed grade 2 invasive mammary carcinoma, prognostic showed ER 90% positive strong staining PR 90% positive strong staining, Ki-67 of 5% and HER2 3+ by [facility]   09/10/2022 Initial Diagnosis   Malignant neoplasm of upper-outer quadrant of right breast in female, estrogen receptor positive (HCC)    Genetic Testing   [facility] CancerNext-Expanded Panel+RNA was Positive. 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  Report date is 10/04/2022.  The CancerNext-Expanded gene panel offered by [facility] and includes sequencing, rearrangement, and RNA analysis for the following 77 genes: AIP, ALK, APC, ATM, AXIN2, BAP1, BARD1, BLM, BMPR1A, BRCA1, BRCA2, BRIP1, CDC73, CDH1, CDK4, CDKN1B, CDKN2A, CHEK2, CTNNA1, DICER1, FANCC, FH, FLCN, GALNT12, KIF1B, LZTR1, MAX, MEN1, MET, MLH1, MSH2, MSH3, MSH6, MUTYH, NBN, NF1, NF2, NTHL1, PALB2, PHOX2B, PMS2, POT1, PRKAR1A, PTCH1, PTEN, RAD51C, RAD51D, RB1, RECQL, RET, SDHA, SDHAF2, SDHB, SDHC, SDHD, SMAD4, SMARCA4, SMARCB1, SMARCE1, STK11, SUFU, TMEM127, TP53, TSC1, TSC2, VHL and XRCC2 (sequencing and deletion/duplication); EGFR, EGLN1, HOXB13, KIT, MITF, PDGFRA, POLD1, and POLE (sequencing only); EPCAM and GREM1 (deletion/duplication only).    10/05/2022 Surgery   Right breast lumpectomy with Dr. 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  Grade 2, 2 sentinel lymph nodes negative for metastasis.  Margins negative.  T1c, N0.   10/05/2022 Cancer Staging   Staging form: Breast, AJCC 8th Edition - Pathologic stage from 10/05/2022: Stage IA (pT1c, pN0, cM0, G2, ER+, PR+, HER2+) - Signed by Loa Socks, NP on 11/08/2022 Stage prefix: Initial diagnosis Histologic grading system: 3 grade system   11/08/2022 -  Chemotherapy   Patient is on Treatment Plan : BREAST Paclitaxel + Trastuzumab q7d / Trastuzumab q21d     03/23/2023 - 04/19/2023 Radiation Therapy   Adjuvant radiation    CURRENT THERAPY: herceptin  INTERVAL HISTORY:  Judy Morrison 72 y.o. female returns for f/u prior to receiving Herceptin therapy.   She is here for follow up. Since her last visit, she has seen neurology who didn't think she has dementia. 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 She was told to eat a cleaner diet. She is overall doing  well. She is going to [place] in October and wants to take covid shot and flu shot.   Rest of the  pertinent 10 point ROS reviewed and neg.  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   Malignant neoplasm of upper-outer quadrant of right breast in female, estrogen receptor positive (HCC) 09/10/2022   Rash and other nonspecific skin eruption 09/15/2020   Allergic contact dermatitis 09/15/2020   Pruritus 08/14/2020   Dizziness 05/10/2018   Atypical chest pain 05/10/2018   Primary osteoarthritis of left knee 01/06/2018   S/P knee replacement 08/12/2017   Incomplete tear of right rotator cuff 05/19/2017   Osteopenia 05/19/2017   Urge incontinence of urine 05/19/2017   Primary open angle glaucoma (POAG) 12/30/2016   Primary osteoarthritis of both knees 12/30/2016   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  MEDICAL HISTORY: Past Medical History:  Diagnosis Date   Anemia    Anxiety    Arthritis    Depressed    Dermatitis    rare form, on prednisone   GERD (gastroesophageal reflux disease)    controlled with Omeprazole   Glaucoma    Torn rotator cuff    RIGHT    Urinary tract infection    dx 05-26-17 took 1 week cipro BID completed 06-02-17.    SURGICAL HISTORY: Past Surgical History:  Procedure Laterality Date   ADENOIDECTOMY     APPENDECTOMY     arthroscopic right knee      BREAST BIOPSY Right 09/01/2022   MM RT BREAST BX W LOC DEV 1ST LESION IMAGE BX SPEC STEREO GUIDE 09/01/2022 [facility]   BREAST BIOPSY  10/01/2022   MM RT RADIOACTIVE SEED LOC MAMMO GUIDE 10/01/2022 [facility]   BREAST LUMPECTOMY WITH RADIOACTIVE SEED AND SENTINEL LYMPH NODE BIOPSY Right 10/05/2022    Procedure: RIGHT BREAST LUMPECTOMY WITH RADIOACTIVE SEED AND AXILLARY SENTINEL LYMPH NODE BIOPSY;  Surgeon: [name], MD;  Location: [facility];  Service: General;  Laterality: Right;   CHOLECYSTECTOMY     COLONOSCOPY  06/06/2017   Pyrtle   Pelvic sling     x2   POLYPECTOMY     PORT A CATH REVISION N/A 11/22/2022   Procedure: PORT A CATH REVISION;  Surgeon: [name], MD;  Location: [facility] OR;  Service: General;  Laterality: N/A;   PORTACATH PLACEMENT Left 10/05/2022   Procedure: INSERTION PORT-A-CATH;  Surgeon: [name], MD;  Location: [facility];  Service: General;  Laterality: Left;   ROTATOR CUFF REPAIR Right    TONSILLECTOMY     TOTAL KNEE ARTHROPLASTY Right 08/12/2017   Procedure: RIGHT TOTAL KNEE ARTHROPLASTY;  Surgeon: Eugenia Mcalpine, MD;  Location: [facility];  Service: Orthopedics;  Laterality: Right;   TOTAL KNEE ARTHROPLASTY Left 01/06/2018   Procedure: LEFT TOTAL KNEE ARTHROPLASTY;  Surgeon: Eugenia Mcalpine, MD;  Location: [facility];  Service: Orthopedics;  Laterality: Left;   UPPER GASTROINTESTINAL ENDOSCOPY     WISDOM TOOTH EXTRACTION      SOCIAL HISTORY: Social History   Socioeconomic History   Marital status: Single    Spouse name: Not on file   Number  of children: Not on file   Years of education: Not on file   Highest education level: Not on file  Occupational History   Not on file  Tobacco Use   Smoking status: Former    Current packs/day: 0.00    Types: Cigarettes    Quit date: 68    Years since quitting: 38.6   Smokeless tobacco: Never  Vaping Use   Vaping status: Never Used  Substance and Sexual Activity   Alcohol use: Yes    Alcohol/week: 2.0 standard drinks of alcohol    Types: 2 Cans of beer per week   Drug use: No   Sexual activity: Not on file  Other Topics Concern   Not on file  Social History Narrative   Not on file   Social Determinants of Health   Financial Resource Strain: Low Risk   (04/11/2023)   Received from [facility]   Overall Financial Resource Strain (CARDIA)    Difficulty of Paying Living Expenses: Not hard at all  Food Insecurity: No Food Insecurity (04/11/2023)   Received from [facility]   Hunger Vital Sign    Worried About Running Out of Food in the Last Year: Never true    Ran Out of Food in the Last Year: Never true  Transportation Needs: No Transportation Needs (04/11/2023)   Received from [facility] - Transportation    Lack of Transportation (Medical): No    Lack of Transportation (Non-Medical): No  Physical Activity: Sufficiently Active (08/22/2022)   Received from [facility]   Exercise Vital Sign    Days of Exercise per Week: 3 days    Minutes of Exercise per Session: 50 min  Stress: No Stress Concern Present (07/28/2021)   Received from [facility] - Occupational Stress Questionnaire    Feeling of Stress : Not at all  Social Connections: Socially Integrated (08/22/2022)   Received from [facility]   Social Network    How would you rate your social network (family, work, friends)?: Good participation with social networks  Intimate Partner Violence: Not At Risk (08/22/2022)   Received from [facility]   HITS    Over the last 12 months how often did your partner physically hurt you?: 1    Over the last 12 months how often did your partner insult you or talk down to you?: 1    Over the last 12 months how often did your partner threaten you with physical harm?: 1    Over the last 12 months how often did your partner scream or curse at you?: 1    FAMILY HISTORY: Family History  Problem Relation Age of Onset   Bowel Disease Mother        ishemic bowel   Lung cancer Mother        smoked   Cervical cancer Paternal Aunt    Cancer Maternal Grandfather        unknown type   Lung cancer Paternal Grandfather         smoked   Colon cancer Neg Hx    Breast cancer Neg Hx    Esophageal cancer Neg Hx    Pancreatic cancer Neg Hx    Prostate cancer Neg Hx    Rectal cancer Neg Hx    Stomach cancer Neg Hx    Allergic rhinitis Neg Hx    Asthma Neg Hx  Eczema Neg Hx    Urticaria Neg Hx    Colon polyps Neg Hx     Review of Systems  Constitutional:  Positive for fatigue. Negative for appetite change, chills, fever and unexpected weight change.  HENT:   Negative for hearing loss, lump/mass and trouble swallowing.   Eyes:  Negative for eye problems and icterus.  Respiratory:  Negative for chest tightness, cough and shortness of breath.   Cardiovascular:  Negative for chest pain, leg swelling and palpitations.  Gastrointestinal:  Negative for abdominal distention, abdominal pain, constipation, diarrhea, nausea and vomiting.  Endocrine: Negative for hot flashes.  Genitourinary:  Negative for difficulty urinating.   Musculoskeletal:  Negative for arthralgias.  Skin:  Negative for itching and rash.  Neurological:  Negative for dizziness, extremity weakness, headaches and numbness.  Hematological:  Negative for adenopathy. Does not bruise/bleed easily.  Psychiatric/Behavioral:  Negative for depression. The patient is not nervous/anxious.       PHYSICAL EXAMINATION     Vitals:   05/09/23 1002  BP: 136/70  Pulse: 88  Resp: 16  Temp: 97.8 F (36.6 C)  SpO2: 96%   Physical exam deferred in lieu of counseling  LABORATORY DATA:  CBC    Component Value Date/Time   WBC 6.4 03/28/2023 0948   WBC 10.1 10/01/2021 1314   RBC 3.83 (L) 03/28/2023 0948   HGB 10.1 (L) 03/28/2023 0948   HGB 13.7 09/15/2020 1209   HCT 30.9 (L) 03/28/2023 0948   HCT 41.3 09/15/2020 1209   PLT 325 03/28/2023 0948   PLT 349 09/15/2020 1209   MCV 80.7 03/28/2023 0948   MCV 86 09/15/2020 1209   MCH 26.4 03/28/2023 0948   MCHC 32.7 03/28/2023 0948   RDW 16.2 (H) 03/28/2023 0948   RDW 12.3 09/15/2020 1209   LYMPHSABS  1.2 03/28/2023 0948   LYMPHSABS 1.2 09/15/2020 1209   MONOABS 0.8 03/28/2023 0948   EOSABS 0.3 03/28/2023 0948   EOSABS 0.2 09/15/2020 1209   BASOSABS 0.1 03/28/2023 0948   BASOSABS 0.1 09/15/2020 1209    CMP     Component Value Date/Time   NA 138 03/28/2023 0948   NA 135 09/15/2020 1209   K 3.9 03/28/2023 0948   CL 104 03/28/2023 0948   CO2 28 03/28/2023 0948   GLUCOSE 83 03/28/2023 0948   BUN 14 03/28/2023 0948   BUN 14 09/15/2020 1209   CREATININE 0.85 03/28/2023 0948   CALCIUM 9.2 03/28/2023 0948   PROT 6.6 03/28/2023 0948   PROT 6.8 09/15/2020 1209   ALBUMIN 4.0 03/28/2023 0948   ALBUMIN 4.6 09/15/2020 1209   AST 16 03/28/2023 0948   ALT 15 03/28/2023 0948   ALKPHOS 94 03/28/2023 0948   BILITOT 0.4 03/28/2023 0948   GFRNONAA >60 03/28/2023 0948   GFRAA 74 09/15/2020 1209     ASSESSMENT and THERAPY PLAN:   Malignant neoplasm of upper-outer quadrant of right breast in female, estrogen receptor positive (HCC) Judy Morrison is a 72 year old woman with history of stage Ia triple positive breast cancer diagnosed in January 2024 status postlumpectomy and here today to begin adjuvant chemotherapy and immunotherapy with paclitaxel and trastuzumab.  Treatment Plan:  Breast conserving surgery with sentinel node biopsy Adjuvant chemotherapy with Taxol/Herceptin followed by Herceptin maintenance Adjuvant radiation therapy Adjuvant antiestrogen therapy __________________________________________ Current treatment: Herceptin  Judy Morrison will continue to receive maintenance Herceptin every 3 weeks.  She is tolerating this well.   At risk for heart failure: Her most recent echo was low normal at  50 to 55%.  She will continue to be followed closely by cardiology. She has been cleared to continue treatment. Memory changes: she followed up with Dr Barbaraann Cao. She hasn't started anastrozole yet, prescription sent, encouraged ca/vit D and weight bearing exercises.  Baseline bone density showed  osteopenia heading to osteoporosis.  RTC every 3 weeks for f/u and Herceptin.        All questions were answered. The patient knows to call the clinic with any problems, questions or concerns. We can certainly see the patient much sooner if necessary.  Total encounter time:30 minutes*in face-to-face visit time, chart review, lab review, care coordination, order entry, and documentation of the encounter time.   *Total Encounter Time as defined by the Centers for Medicare and Medicaid Services includes, in addition to the face-to-face time of a patient visit (documented in the note above) non-face-to-face time: obtaining and reviewing outside history, ordering and reviewing medications, tests or procedures, care coordination (communications with other health care professionals or caregivers) and documentation in the medical record.

## 2023-05-13 ENCOUNTER — Other Ambulatory Visit: Payer: Self-pay

## 2023-05-30 ENCOUNTER — Encounter: Payer: Self-pay | Admitting: Adult Health

## 2023-05-30 ENCOUNTER — Inpatient Hospital Stay: Payer: Medicare Other | Attending: Hematology and Oncology | Admitting: Adult Health

## 2023-05-30 ENCOUNTER — Inpatient Hospital Stay: Payer: Medicare Other

## 2023-05-30 VITALS — BP 142/69 | HR 88 | Resp 16

## 2023-05-30 DIAGNOSIS — Z87891 Personal history of nicotine dependence: Secondary | ICD-10-CM | POA: Diagnosis not present

## 2023-05-30 DIAGNOSIS — Z8049 Family history of malignant neoplasm of other genital organs: Secondary | ICD-10-CM | POA: Insufficient documentation

## 2023-05-30 DIAGNOSIS — C50411 Malignant neoplasm of upper-outer quadrant of right female breast: Secondary | ICD-10-CM | POA: Diagnosis not present

## 2023-05-30 DIAGNOSIS — Z79811 Long term (current) use of aromatase inhibitors: Secondary | ICD-10-CM | POA: Diagnosis not present

## 2023-05-30 DIAGNOSIS — Z5112 Encounter for antineoplastic immunotherapy: Secondary | ICD-10-CM | POA: Diagnosis present

## 2023-05-30 DIAGNOSIS — Z801 Family history of malignant neoplasm of trachea, bronchus and lung: Secondary | ICD-10-CM | POA: Diagnosis not present

## 2023-05-30 DIAGNOSIS — Z170 Estrogen receptor positive status [ER+]: Secondary | ICD-10-CM | POA: Diagnosis not present

## 2023-05-30 MED ORDER — SODIUM CHLORIDE 0.9 % IV SOLN: Freq: Once | INTRAVENOUS | Status: AC

## 2023-05-30 MED ORDER — TRASTUZUMAB-DTTB CHEMO 150 MG IV SOLR
6.0000 mg/kg | Freq: Once | INTRAVENOUS | Status: AC
  Administered 2023-05-30: 420 mg via INTRAVENOUS
  Filled 2023-05-30: qty 20

## 2023-05-30 MED ORDER — HEPARIN SOD (PORK) LOCK FLUSH 100 UNIT/ML IV SOLN
500.0000 [IU] | Freq: Once | INTRAVENOUS | Status: AC | PRN
  Administered 2023-05-30: 500 [IU]

## 2023-05-30 MED ORDER — DIPHENHYDRAMINE HCL 25 MG PO CAPS
50.0000 mg | ORAL_CAPSULE | Freq: Once | ORAL | Status: AC
  Administered 2023-05-30: 50 mg via ORAL
  Filled 2023-05-30: qty 2

## 2023-05-30 MED ORDER — ACETAMINOPHEN 325 MG PO TABS
650.0000 mg | ORAL_TABLET | Freq: Once | ORAL | Status: AC
  Administered 2023-05-30: 650 mg via ORAL
  Filled 2023-05-30: qty 2

## 2023-05-30 MED ORDER — SODIUM CHLORIDE 0.9% FLUSH
10.0000 mL | INTRAVENOUS | Status: DC | PRN
  Administered 2023-05-30: 10 mL

## 2023-05-30 NOTE — Patient Instructions (Signed)
Judy Morrison  Discharge Instructions: Thank you for choosing [facility] to provide your oncology and hematology care.   If you have a lab appointment with the [facility], please go directly to the [facility] and check in at the registration area.   Wear comfortable clothing and clothing appropriate for easy access to any Portacath or PICC line.   We strive to give you quality time with your provider. You may need to reschedule your appointment if you arrive late (15 or more minutes).  Arriving late affects you and other patients whose appointments are after yours.  Also, if you miss three or more appointments without notifying the office, you may be dismissed from the clinic at the provider's discretion.      For prescription refill requests, have your pharmacy contact our office and allow 72 hours for refills to be completed.    Today you received the following chemotherapy and/or immunotherapy agent: Trastuzumab    To help prevent nausea and vomiting after your treatment, we encourage you to take your nausea medication as directed.  BELOW ARE SYMPTOMS THAT SHOULD BE REPORTED IMMEDIATELY: *FEVER GREATER THAN 100.4 F (38 C) OR HIGHER *CHILLS OR SWEATING *NAUSEA AND VOMITING THAT IS NOT CONTROLLED WITH YOUR NAUSEA MEDICATION *UNUSUAL SHORTNESS OF BREATH *UNUSUAL BRUISING OR BLEEDING *URINARY PROBLEMS (pain or burning when urinating, or frequent urination) *BOWEL PROBLEMS (unusual diarrhea, constipation, pain near the anus) TENDERNESS IN MOUTH AND THROAT WITH OR WITHOUT PRESENCE OF ULCERS (sore throat, sores in mouth, or a toothache) UNUSUAL RASH, SWELLING OR PAIN  UNUSUAL VAGINAL DISCHARGE OR ITCHING   Items with * indicate a potential emergency and should be followed up as soon as possible or go to the Emergency Department if any problems should occur.  Please show the CHEMOTHERAPY ALERT CARD or IMMUNOTHERAPY ALERT CARD at  check-in to the Emergency Department and triage nurse.  Should you have questions after your visit or need to cancel or reschedule your appointment, please contact [facility]  Dept: [phone]  and follow the prompts.  Office hours are 8:00 a.m. to 4:30 p.m. Monday - Friday. Please note that voicemails left after 4:00 p.m. may not be returned until the following business day.  We are closed weekends and major holidays. You have access to a nurse at all times for urgent questions. Please call the main number to the clinic Dept: [phone] and follow the prompts.   For any non-urgent questions, you may also contact your provider using MyChart. We now offer e-Visits for anyone 63 and older to request care online for non-urgent symptoms. For details visit [email or web address].   Also download the MyChart app! Go to the app store, search "MyChart", open the app, select [place], and log in with your MyChart username and password.  Trastuzumab Injection What is this medication? TRASTUZUMAB (tras TOO zoo mab) treats breast cancer and stomach cancer. It works by blocking a protein that causes cancer cells to grow and multiply. This helps to slow or stop the spread of cancer cells. This medicine may be used for other purposes; ask your health care provider or pharmacist if you have questions. COMMON BRAND NAME(S): Herceptin, Marlowe Alt, Ontruzant, Trazimera What should I tell my care team before I take this medication? They need to know if you have any of these conditions: Heart failure Lung disease An unusual or allergic reaction to trastuzumab, other medications, foods, dyes,  or preservatives Pregnant or trying to get pregnant Breast-feeding How should I use this medication? This medication is injected into a vein. It is given by your care team in a hospital or clinic setting. Talk to your care team about the use of this medication in  children. It is not approved for use in children. Overdosage: If you think you have taken too much of this medicine contact a poison control [facility] or emergency room at once. NOTE: This medicine is only for you. Do not share this medicine with others. What if I miss a dose? Keep appointments for follow-up doses. It is important not to miss your dose. Call your care team if you are unable to keep an appointment. What may interact with this medication? Certain types of chemotherapy, such as daunorubicin, doxorubicin, epirubicin, idarubicin This list may not describe all possible interactions. Give your health care provider a list of all the medicines, herbs, non-prescription drugs, or dietary supplements you use. Also tell them if you smoke, drink alcohol, or use illegal drugs. Some items may interact with your medicine. What should I watch for while using this medication? Your condition will be monitored carefully while you are receiving this medication. This medication may make you feel generally unwell. This is not uncommon, as chemotherapy affects healthy cells as well as cancer cells. Report any side effects. Continue your course of treatment even though you feel ill unless your care team tells you to stop. This medication may increase your risk of getting an infection. Call your care team for advice if you get a fever, chills, sore throat, or other symptoms of a cold or flu. Do not treat yourself. Try to avoid being around people who are sick. Avoid taking medications that contain aspirin, acetaminophen, ibuprofen, naproxen, or ketoprofen unless instructed by your care team. These medications can hide a fever. Talk to your care team if you may be pregnant. Serious birth defects can occur if you take this medication during pregnancy and for 7 months after the last dose. You will need a negative pregnancy test before starting this medication. Contraception is recommended while taking this medication  and for 7 months after the last dose. Your care team can help you find the option that works for you. Do not breastfeed while taking this medication and for 7 months after stopping treatment. What side effects may I notice from receiving this medication? Side effects that you should report to your care team as soon as possible: Allergic reactions or angioedema--skin rash, itching or hives, swelling of the face, eyes, lips, tongue, arms, or legs, trouble swallowing or breathing Dry cough, shortness of breath or trouble breathing Heart failure--shortness of breath, swelling of the ankles, feet, or hands, sudden weight gain, unusual weakness or fatigue Infection--fever, chills, cough, or sore throat Infusion reactions--chest pain, shortness of breath or trouble breathing, feeling faint or lightheaded Side effects that usually do not require medical attention (report to your care team if they continue or are bothersome): Diarrhea Dizziness Headache Nausea Trouble sleeping Vomiting This list may not describe all possible side effects. Call your doctor for medical advice about side effects. You may report side effects to FDA at 1-800-FDA-1088. Where should I keep my medication? This medication is given in a hospital or clinic. It will not be stored at home. NOTE: This sheet is a summary. It may not cover all possible information. If you have questions about this medicine, talk to your doctor, pharmacist, or health care provider.  2024 Elsevier/Gold Standard (2022-01-12 00:00:00)

## 2023-05-30 NOTE — Progress Notes (Signed)
[facility] Follow up:    Medicine, [facility] [address] [place] [address]   DIAGNOSIS:  Cancer Staging  Malignant neoplasm of upper-outer quadrant of right breast in female, estrogen receptor positive (HCC) Staging form: Breast, AJCC 8th Edition - Clinical stage from 09/15/2022: Stage IA (cT1c, cN0, cM0, G2, ER+, PR+, HER2+) - Unsigned Stage prefix: Initial diagnosis Histologic grading system: 3 grade system Laterality: Right Staged by: Pathologist and managing physician Stage used in treatment planning: Yes National guidelines used in treatment planning: Yes Type of national guideline used in treatment planning: NCCN - Pathologic stage from 10/05/2022: Stage IA (pT1c, pN0, cM0, G2, ER+, PR+, HER2+) - Signed by Loa Socks, NP on 11/08/2022 Stage prefix: Initial diagnosis Histologic grading system: 3 grade system   SUMMARY OF ONCOLOGIC HISTORY: Oncology History  Malignant neoplasm of upper-outer quadrant of right breast in female, estrogen receptor positive (HCC)  08/02/2022 Mammogram   In the right breast, possible distortion warrants further evaluation. In the left breast, no findings suspicious for malignancy. Diagnostic mammogram showed persistent subtle distortion central slightly LATERAL aspect of the RIGHT breast warranting tissue diagnosis.     09/01/2022 Pathology Results   Final pathology showed grade 2 invasive mammary carcinoma, prognostic showed ER 90% positive strong staining PR 90% positive strong staining, Ki-67 of 5% and HER2 3+ by [facility]   09/10/2022 Initial Diagnosis   Malignant neoplasm of upper-outer quadrant of right breast in female, estrogen receptor positive (HCC)    Genetic Testing   [facility] CancerNext-Expanded Panel+RNA was Positive. 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  Report date is 10/04/2022.  The CancerNext-Expanded gene panel offered by [facility] and includes sequencing, rearrangement, and RNA analysis for the following 77 genes: AIP, ALK, APC, ATM, AXIN2, BAP1, BARD1, BLM, BMPR1A, BRCA1, BRCA2, BRIP1, CDC73, CDH1, CDK4, CDKN1B, CDKN2A, CHEK2, CTNNA1, DICER1, FANCC, FH, FLCN, GALNT12, KIF1B, LZTR1, MAX, MEN1, MET, MLH1, MSH2, MSH3, MSH6, MUTYH, NBN, NF1, NF2, NTHL1, PALB2, PHOX2B, PMS2, POT1, PRKAR1A, PTCH1, PTEN, RAD51C, RAD51D, RB1, RECQL, RET, SDHA, SDHAF2, SDHB, SDHC, SDHD, SMAD4, SMARCA4, SMARCB1, SMARCE1, STK11, SUFU, TMEM127, TP53, TSC1, TSC2, VHL and XRCC2 (sequencing and deletion/duplication); EGFR, EGLN1, HOXB13, KIT, MITF, PDGFRA, POLD1, and POLE (sequencing only); EPCAM and GREM1 (deletion/duplication only).    10/05/2022 Surgery   Right breast lumpectomy with Dr. 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  Grade 2, 2 sentinel lymph nodes negative for metastasis.  Margins negative.  T1c, N0.   10/05/2022 Cancer Staging   Staging form: Breast, AJCC 8th Edition - Pathologic stage from 10/05/2022: Stage IA (pT1c, pN0, cM0, G2, ER+, PR+, HER2+) - Signed by Loa Socks, NP on 11/08/2022 Stage prefix: Initial diagnosis Histologic grading system: 3 grade system   11/08/2022 -  Chemotherapy   Patient is on Treatment Plan : BREAST Paclitaxel + Trastuzumab q7d / Trastuzumab q21d     03/23/2023 - 04/19/2023 Radiation Therapy   Adjuvant radiation     CURRENT THERAPY: Herceptin  INTERVAL HISTORY: Judy Morrison 72 y.o. female returns for f/u and evaluation of her breast cancer on treatment with Herceptin and anastrozole.  She has been tolerating the anastrozole well since starting it and denies any side effects.  Recent echocardiogram occurred on April 05, 2023 demonstrating an LVEF of 50 to 55%.  She was seen by Dr. Gasper Lloyd that day as  well and was recommended to continue treatment.    She endorses  continued memory changes and notes that it is worse when she is finding words. She was evaluated by Dr. Barbaraann Cao in neuro-oncology. She is also concerned about her weight gain.  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   Malignant neoplasm of upper-outer quadrant of right breast in female, estrogen receptor positive (HCC) 09/10/2022   Rash and other nonspecific skin eruption 09/15/2020   Allergic contact dermatitis 09/15/2020   Pruritus 08/14/2020   Dizziness 05/10/2018   Atypical chest pain 05/10/2018   Primary osteoarthritis of left knee 01/06/2018   S/P knee replacement 08/12/2017   Incomplete tear of right rotator cuff 05/19/2017   Osteopenia 05/19/2017   Urge incontinence of urine 05/19/2017   Primary open angle glaucoma (POAG) 12/30/2016   Primary osteoarthritis of both knees 12/30/2016   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  MEDICAL HISTORY: Past Medical History:  Diagnosis Date   Anemia    Anxiety    Arthritis    Depressed    Dermatitis    rare form, on prednisone   GERD (gastroesophageal reflux disease)    controlled with Omeprazole   Glaucoma    Torn rotator cuff    RIGHT    Urinary tract infection    dx 05-26-17 took 1 week cipro BID completed 06-02-17.    SURGICAL HISTORY: Past Surgical History:  Procedure Laterality Date   ADENOIDECTOMY     APPENDECTOMY     arthroscopic right knee      BREAST BIOPSY Right 09/01/2022   MM RT BREAST BX W LOC DEV 1ST LESION IMAGE BX SPEC STEREO GUIDE 09/01/2022 [facility]   BREAST BIOPSY  10/01/2022   MM RT RADIOACTIVE SEED LOC MAMMO  GUIDE 10/01/2022 [facility]   BREAST LUMPECTOMY WITH RADIOACTIVE SEED AND SENTINEL LYMPH NODE BIOPSY Right 10/05/2022   Procedure: RIGHT BREAST LUMPECTOMY WITH RADIOACTIVE SEED AND AXILLARY SENTINEL LYMPH NODE BIOPSY;  Surgeon: [name], MD;  Location: [facility];  Service: General;  Laterality: Right;   CHOLECYSTECTOMY     COLONOSCOPY  06/06/2017   Pyrtle   Pelvic sling     x2   POLYPECTOMY     PORT A CATH REVISION N/A 11/22/2022   Procedure: PORT A CATH REVISION;  Surgeon: [name], MD;  Location: [facility] OR;  Service: General;  Laterality: N/A;   PORTACATH PLACEMENT Left 10/05/2022   Procedure: INSERTION PORT-A-CATH;  Surgeon: [name], MD;  Location: [facility];  Service: General;  Laterality: Left;   ROTATOR CUFF REPAIR Right    TONSILLECTOMY     TOTAL KNEE ARTHROPLASTY Right 08/12/2017   Procedure: RIGHT TOTAL KNEE ARTHROPLASTY;  Surgeon: Eugenia Mcalpine, MD;  Location: [facility];  Service: Orthopedics;  Laterality: Right;   TOTAL KNEE ARTHROPLASTY Left 01/06/2018   Procedure: LEFT TOTAL KNEE ARTHROPLASTY;  Surgeon: Eugenia Mcalpine, MD;  Location: [facility];  Service: Orthopedics;  Laterality: Left;   UPPER GASTROINTESTINAL ENDOSCOPY     WISDOM TOOTH EXTRACTION      SOCIAL HISTORY: Social History   Socioeconomic History   Marital  status: Single    Spouse name: Not on file   Number of children: Not on file   Years of education: Not on file   Highest education level: Not on file  Occupational History   Not on file  Tobacco Use   Smoking status: Former    Current packs/day: 0.00    Types: Cigarettes    Quit date: 54    Years since quitting: 38.7   Smokeless tobacco: Never  Vaping Use   Vaping status: Never Used  Substance and Sexual Activity   Alcohol use: Yes    Alcohol/week: 2.0 standard drinks of alcohol    Types: 2 Cans of beer per week   Drug use: No   Sexual activity: Not on file  Other Topics Concern    Not on file  Social History Narrative   Not on file   Social Determinants of Health   Financial Resource Strain: Low Risk  (04/11/2023)   Received from [facility]   Overall Financial Resource Strain (CARDIA)    Difficulty of Paying Living Expenses: Not hard at all  Food Insecurity: No Food Insecurity (04/11/2023)   Received from [facility]   Hunger Vital Sign    Worried About Running Out of Food in the Last Year: Never true    Ran Out of Food in the Last Year: Never true  Transportation Needs: No Transportation Needs (04/11/2023)   Received from [facility] - Transportation    Lack of Transportation (Medical): No    Lack of Transportation (Non-Medical): No  Physical Activity: Sufficiently Active (08/22/2022)   Received from [facility], [facility]   Exercise Vital Sign    Days of Exercise per Week: 3 days    Minutes of Exercise per Session: 50 min  Stress: No Stress Concern Present (07/28/2021)   Received from [facility] - Occupational Stress Questionnaire    Feeling of Stress : Not at all  Social Connections: Socially Integrated (08/22/2022)   Received from [facility], [facility]   Social Network    How would you rate your social network (family, work, friends)?: Good participation with social networks  Intimate Partner Violence: Not At Risk (08/22/2022)   Received from [facility], [facility]   HITS    Over the last 12 months how often did your partner physically hurt you?: 1    Over the last 12 months how often did your partner insult you or talk down to you?: 1    Over the last 12 months how often did your partner threaten you with physical harm?: 1    Over the last 12 months how often did your partner scream or curse at you?: 1    FAMILY HISTORY: Family History  Problem Relation Age of Onset   Bowel Disease Mother        ishemic bowel   Lung cancer Mother        smoked    Cervical cancer Paternal Aunt    Cancer Maternal Grandfather        unknown type   Lung cancer Paternal Grandfather        smoked   Colon cancer Neg Hx    Breast cancer Neg Hx    Esophageal cancer Neg Hx    Pancreatic cancer Neg Hx    Prostate cancer Neg Hx    Rectal cancer Neg Hx    Stomach cancer Neg Hx  Allergic rhinitis Neg Hx    Asthma Neg Hx    Eczema Neg Hx    Urticaria Neg Hx    Colon polyps Neg Hx     Review of Systems  Constitutional:  Negative for appetite change, chills, fatigue, fever and unexpected weight change.  HENT:   Negative for hearing loss, lump/mass and trouble swallowing.   Eyes:  Negative for eye problems and icterus.  Respiratory:  Negative for chest tightness, cough and shortness of breath.   Cardiovascular:  Negative for chest pain, leg swelling and palpitations.  Gastrointestinal:  Negative for abdominal distention, abdominal pain, constipation, diarrhea, nausea and vomiting.  Endocrine: Negative for hot flashes.  Genitourinary:  Negative for difficulty urinating.   Musculoskeletal:  Negative for arthralgias.  Skin:  Negative for itching and rash.  Neurological:  Negative for dizziness, extremity weakness, headaches and numbness.  Hematological:  Negative for adenopathy. Does not bruise/bleed easily.  Psychiatric/Behavioral:  Negative for depression. The patient is not nervous/anxious.       PHYSICAL EXAMINATION    Vitals:   05/30/23 1229  BP: (!) 148/72  Pulse: 98  Resp: 17  Temp: (!) 97.5 F (36.4 C)  SpO2: 100%    Physical Exam Constitutional:      General: She is not in acute distress.    Appearance: Normal appearance. She is not toxic-appearing.  HENT:     Head: Normocephalic and atraumatic.     Mouth/Throat:     Mouth: Mucous membranes are moist.     Pharynx: Oropharynx is clear. No oropharyngeal exudate or posterior oropharyngeal erythema.  Eyes:     General: No scleral icterus. Cardiovascular:     Rate and Rhythm:  Normal rate and regular rhythm.     Pulses: Normal pulses.     Heart sounds: Normal heart sounds.  Pulmonary:     Effort: Pulmonary effort is normal.     Breath sounds: Normal breath sounds.  Abdominal:     General: Abdomen is flat. Bowel sounds are normal. There is no distension.     Palpations: Abdomen is soft.     Tenderness: There is no abdominal tenderness.  Musculoskeletal:        General: No swelling.     Cervical back: Neck supple.  Lymphadenopathy:     Cervical: No cervical adenopathy.  Skin:    General: Skin is warm and dry.     Findings: No rash.  Neurological:     General: No focal deficit present.     Mental Status: She is alert.  Psychiatric:        Mood and Affect: Mood normal.        Behavior: Behavior normal.     LABORATORY DATA:  CBC    Component Value Date/Time   WBC 6.4 03/28/2023 0948   WBC 10.1 10/01/2021 1314   RBC 3.83 (L) 03/28/2023 0948   HGB 10.1 (L) 03/28/2023 0948   HGB 13.7 09/15/2020 1209   HCT 30.9 (L) 03/28/2023 0948   HCT 41.3 09/15/2020 1209   PLT 325 03/28/2023 0948   PLT 349 09/15/2020 1209   MCV 80.7 03/28/2023 0948   MCV 86 09/15/2020 1209   MCH 26.4 03/28/2023 0948   MCHC 32.7 03/28/2023 0948   RDW 16.2 (H) 03/28/2023 0948   RDW 12.3 09/15/2020 1209   LYMPHSABS 1.2 03/28/2023 0948   LYMPHSABS 1.2 09/15/2020 1209   MONOABS 0.8 03/28/2023 0948   EOSABS 0.3 03/28/2023 0948   EOSABS 0.2 09/15/2020 1209  BASOSABS 0.1 03/28/2023 0948   BASOSABS 0.1 09/15/2020 1209    CMP     Component Value Date/Time   NA 138 03/28/2023 0948   NA 135 09/15/2020 1209   K 3.9 03/28/2023 0948   CL 104 03/28/2023 0948   CO2 28 03/28/2023 0948   GLUCOSE 83 03/28/2023 0948   BUN 14 03/28/2023 0948   BUN 14 09/15/2020 1209   CREATININE 0.85 03/28/2023 0948   CALCIUM 9.2 03/28/2023 0948   PROT 6.6 03/28/2023 0948   PROT 6.8 09/15/2020 1209   ALBUMIN 4.0 03/28/2023 0948   ALBUMIN 4.6 09/15/2020 1209   AST 16 03/28/2023 0948   ALT 15  03/28/2023 0948   ALKPHOS 94 03/28/2023 0948   BILITOT 0.4 03/28/2023 0948   GFRNONAA >60 03/28/2023 0948   GFRAA 74 09/15/2020 1209          ASSESSMENT and THERAPY PLAN:   Malignant neoplasm of upper-outer quadrant of right breast in female, estrogen receptor positive (HCC) Judy Morrison is a 72 year old woman with history of stage Ia triple positive breast cancer diagnosed in January 2024 status postlumpectomy and here today to begin adjuvant chemotherapy and immunotherapy with paclitaxel and trastuzumab.  Treatment Plan:  Breast conserving surgery with sentinel node biopsy Adjuvant chemotherapy with Taxol/Herceptin followed by Herceptin maintenance Adjuvant radiation therapy Adjuvant antiestrogen therapy __________________________________________ Current treatment: Herceptin Anastrozole  Judy Morrison will continue to receive maintenance Herceptin every 3 weeks and Anastrozole daily.  She is tolerating this well.   At risk for heart failure: Her most recent echo was low normal at 50 to 55%.  She will continue to be followed closely by cardiology. She has been cleared to continue treatment. Memory changes: she followed up with Dr Barbaraann Cao.  I recommended healthy diet, exercise good sleep and reassured her that time a lot of times will help. Weight gain: I let Judy Morrison know that it is perfectly reasonable to adjust her diet and exercise.  RTC every 3 weeks for f/u and Herceptin.      All questions were answered. The patient knows to call the clinic with any problems, questions or concerns. We can certainly see the patient much sooner if necessary.  Total encounter time:30 minutes*in face-to-face visit time, chart review, lab review, care coordination, order entry, and documentation of the encounter time.    Lillard Anes, NP 05/30/23 2:07 PM Medical Oncology and Hematology [facility] [address] [place], [place] [address] Tel. [phone]    Fax.  [phone]  *Total Encounter Time as defined by the Centers for Medicare and Medicaid Services includes, in addition to the face-to-face time of a patient visit (documented in the note above) non-face-to-face time: obtaining and reviewing outside history, ordering and reviewing medications, tests or procedures, care coordination (communications with other health care professionals or caregivers) and documentation in the medical record.

## 2023-05-30 NOTE — Assessment & Plan Note (Signed)
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  Treatment Plan:  Breast conserving surgery with sentinel node biopsy Adjuvant chemotherapy with Taxol/Herceptin followed by Herceptin maintenance Adjuvant radiation therapy Adjuvant antiestrogen therapy __________________________________________ Current treatment: Herceptin Anastrozole  Wava will continue to receive maintenance Herceptin every 3 weeks and Anastrozole daily.  She is tolerating this well.   At risk for heart failure: Her most recent echo was low normal at 50 to 55%.  She will continue to be followed closely by cardiology. She has been cleared to continue treatment. Memory changes: she followed up with Dr Barbaraann Cao.  I recommended healthy diet, exercise good sleep and reassured her that time a lot of times will help. Weight gain: I let Cherrise know that it is perfectly reasonable to adjust her diet and exercise.  RTC every 3 weeks for f/u and Herceptin.

## 2023-06-03 ENCOUNTER — Telehealth: Payer: Self-pay | Admitting: Internal Medicine

## 2023-06-03 NOTE — Telephone Encounter (Signed)
PT has breast cancer and recently saw blood after BM. She wants to know if she should have an early colonoscopy or if she should allow her oncologist to make that determination, just for any early detection. Please advise.

## 2023-06-06 ENCOUNTER — Telehealth: Payer: Self-pay

## 2023-06-06 NOTE — Telephone Encounter (Signed)
Pt LVM with questions regarding a colonoscopy. This RN called back and LVM with call back number [phone].

## 2023-06-06 NOTE — Telephone Encounter (Signed)
Her bright red blood per rectum with wiping is likely related to internal hemorrhoids as seen at colonoscopy 2 years ago If this comes a regular occurrence I would recommend she be seen in clinic For now I am comfortable with colonoscopy surveillance as previously recommended in 2027

## 2023-06-06 NOTE — Telephone Encounter (Signed)
Pt returned phone call stating that she has noticed blood on her toilet paper when she wipes to go to the bathroom. Blood is described to be a scant/small amount and bright red in appearance. Pt states that she also has contacted her gastroenterology office and that the staff has sent a message to her gastroenterologist for further instruction but are thinking that it is related to her hemorrhoids. She denies any jelly-like appearance, clots, or other textures in blood or stool when she wipes. Pt denies any blood found in actual stool and that it is just on the toilet paper.  This RN suggested to pt to see what her gastroenterologist suggest. In the meantime, if she has any worsening symptoms such as an increase in blood, starts to notice blood in her stool, or any other changes in the blood color/consistency to please give [place] a call back. Pt verbalized understanding.    This RN also requested pt to please give [place] a call back to follow up on her gastroenterologists suggestions. Pt verbalized understanding and stated that she would call us back.

## 2023-06-06 NOTE — Telephone Encounter (Signed)
Left detailed message for pt regarding recommendations per Dr. Rhea Belton.

## 2023-06-06 NOTE — Telephone Encounter (Signed)
Left message for pt to call back.  Pt was dx with breast ca in December. She had chemo and radiation. She has recently noticed when she has a BM that she sees a small amt of BRB on the tissue. She has only seen it on the tissue, she has not seen any bleeding when she is not having a BM. Last colon report did mention internal hemorrhoids. Pt wanted to check with Dr. Rhea Belton since she did have the breast ca dx. Please advise.

## 2023-06-13 DIAGNOSIS — M79672 Pain in left foot: Secondary | ICD-10-CM | POA: Insufficient documentation

## 2023-06-14 ENCOUNTER — Other Ambulatory Visit: Payer: Self-pay

## 2023-06-20 ENCOUNTER — Inpatient Hospital Stay: Payer: Medicare Other | Attending: Hematology and Oncology

## 2023-06-20 ENCOUNTER — Other Ambulatory Visit: Payer: Self-pay

## 2023-06-20 ENCOUNTER — Other Ambulatory Visit: Payer: Self-pay | Admitting: Hematology and Oncology

## 2023-06-20 ENCOUNTER — Inpatient Hospital Stay (HOSPITAL_BASED_OUTPATIENT_CLINIC_OR_DEPARTMENT_OTHER): Payer: Medicare Other | Admitting: Hematology and Oncology

## 2023-06-20 VITALS — BP 127/66 | HR 98 | Resp 16

## 2023-06-20 DIAGNOSIS — C50411 Malignant neoplasm of upper-outer quadrant of right female breast: Secondary | ICD-10-CM

## 2023-06-20 DIAGNOSIS — Z1721 Progesterone receptor positive status: Secondary | ICD-10-CM | POA: Diagnosis not present

## 2023-06-20 DIAGNOSIS — Z170 Estrogen receptor positive status [ER+]: Secondary | ICD-10-CM | POA: Insufficient documentation

## 2023-06-20 DIAGNOSIS — Z87891 Personal history of nicotine dependence: Secondary | ICD-10-CM | POA: Diagnosis not present

## 2023-06-20 DIAGNOSIS — Z79811 Long term (current) use of aromatase inhibitors: Secondary | ICD-10-CM | POA: Diagnosis not present

## 2023-06-20 DIAGNOSIS — Z5112 Encounter for antineoplastic immunotherapy: Secondary | ICD-10-CM | POA: Insufficient documentation

## 2023-06-20 DIAGNOSIS — N390 Urinary tract infection, site not specified: Secondary | ICD-10-CM | POA: Diagnosis not present

## 2023-06-20 DIAGNOSIS — K625 Hemorrhage of anus and rectum: Secondary | ICD-10-CM | POA: Diagnosis not present

## 2023-06-20 MED ORDER — TRASTUZUMAB-DTTB CHEMO 150 MG IV SOLR
6.0000 mg/kg | Freq: Once | INTRAVENOUS | Status: AC
  Administered 2023-06-20: 420 mg via INTRAVENOUS
  Filled 2023-06-20: qty 20

## 2023-06-20 MED ORDER — SODIUM CHLORIDE 0.9 % IV SOLN: Freq: Once | INTRAVENOUS | Status: AC

## 2023-06-20 MED ORDER — DIPHENHYDRAMINE HCL 25 MG PO CAPS
50.0000 mg | ORAL_CAPSULE | Freq: Once | ORAL | Status: AC
  Administered 2023-06-20: 50 mg via ORAL
  Filled 2023-06-20: qty 2

## 2023-06-20 MED ORDER — HEPARIN SOD (PORK) LOCK FLUSH 100 UNIT/ML IV SOLN
500.0000 [IU] | Freq: Once | INTRAVENOUS | Status: AC | PRN
  Administered 2023-06-20: 500 [IU]

## 2023-06-20 MED ORDER — ACETAMINOPHEN 325 MG PO TABS
650.0000 mg | ORAL_TABLET | Freq: Once | ORAL | Status: AC
  Administered 2023-06-20: 650 mg via ORAL
  Filled 2023-06-20: qty 2

## 2023-06-20 MED ORDER — SODIUM CHLORIDE 0.9% FLUSH
10.0000 mL | INTRAVENOUS | Status: DC | PRN
  Administered 2023-06-20: 10 mL

## 2023-06-20 NOTE — Progress Notes (Signed)
[facility] Cancer Follow up:    Medicine, [facility] [address] [place] [address]   DIAGNOSIS:  Cancer Staging  Malignant neoplasm of upper-outer quadrant of right breast in female, estrogen receptor positive (HCC) Staging form: Breast, AJCC 8th Edition - Pathologic stage from 10/05/2022: Stage IA (pT1c, pN0, cM0, G2, ER+, PR+, HER2+) - Signed by Loa Socks, NP on 11/08/2022 Stage prefix: Initial diagnosis Histologic grading system: 3 grade system   SUMMARY OF ONCOLOGIC HISTORY: Oncology History  Malignant neoplasm of upper-outer quadrant of right breast in female, estrogen receptor positive (HCC)  08/02/2022 Mammogram   In the right breast, possible distortion warrants further evaluation. In the left breast, no findings suspicious for malignancy. Diagnostic mammogram showed persistent subtle distortion central slightly LATERAL aspect of the RIGHT breast warranting tissue diagnosis.     09/01/2022 Pathology Results   Final pathology showed grade 2 invasive mammary carcinoma, prognostic showed ER 90% positive strong staining PR 90% positive strong staining, Ki-67 of 5% and HER2 3+ by [facility]   09/10/2022 Initial Diagnosis   Malignant neoplasm of upper-outer quadrant of right breast in female, estrogen receptor positive (HCC)    Genetic Testing   [facility] CancerNext-Expanded Panel+RNA was Positive. 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 Report date is 10/04/2022.  The CancerNext-Expanded gene panel offered by [facility] and includes sequencing, rearrangement, and RNA analysis for the following 77 genes: AIP, ALK, APC, ATM, AXIN2, BAP1, BARD1, BLM, BMPR1A, BRCA1, BRCA2, BRIP1, CDC73, CDH1, CDK4, CDKN1B, CDKN2A, CHEK2, CTNNA1, DICER1, FANCC, FH, FLCN, GALNT12, KIF1B, LZTR1, MAX, MEN1, MET, MLH1, MSH2, MSH3, MSH6,  MUTYH, NBN, NF1, NF2, NTHL1, PALB2, PHOX2B, PMS2, POT1, PRKAR1A, PTCH1, PTEN, RAD51C, RAD51D, RB1, RECQL, RET, SDHA, SDHAF2, SDHB, SDHC, SDHD, SMAD4, SMARCA4, SMARCB1, SMARCE1, STK11, SUFU, TMEM127, TP53, TSC1, TSC2, VHL and XRCC2 (sequencing and deletion/duplication); EGFR, EGLN1, HOXB13, KIT, MITF, PDGFRA, POLD1, and POLE (sequencing only); EPCAM and GREM1 (deletion/duplication only).    10/05/2022 Surgery   Right breast lumpectomy with Dr. 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  Grade 2, 2 sentinel lymph nodes negative for metastasis.  Margins negative.  T1c, N0.   10/05/2022 Cancer Staging   Staging form: Breast, AJCC 8th Edition - Pathologic stage from 10/05/2022: Stage IA (pT1c, pN0, cM0, G2, ER+, PR+, HER2+) - Signed by Loa Socks, NP on 11/08/2022 Stage prefix: Initial diagnosis Histologic grading system: 3 grade system   11/08/2022 -  Chemotherapy   Patient is on Treatment Plan : BREAST Paclitaxel + Trastuzumab q7d / Trastuzumab q21d     03/23/2023 - 04/19/2023 Radiation Therapy   Adjuvant radiation     CURRENT THERAPY: Herceptin  INTERVAL HISTORY: Eulis Foster Oestreich 72 y.o. female returns for f/u and evaluation of her breast cancer on treatment with Herceptin and anastrozole.  She has been tolerating the anastrozole well since starting it and denies any side effects.  Recent echocardiogram occurred on April 05, 2023 demonstrating an LVEF of 50 to 55%.  She was seen by Dr. Gasper Lloyd that day as well and was recommended to continue treatment.    She has been prescribed several new medications for a sprained foot tendon and a urinary tract infection (UTI). In addition to these issues, the patient mentions occasional rectal bleeding, which she attributes to internal hemorrhoids diagnosed during a previous colonoscopy.  The patient's daughter, who is  present during the visit, reports that the patient's memory issues have not improved, but a  recent consultation with a neurologist did not reveal any new concerns. The patient's gait has changed, and the neurologist confirmed a diagnosis of neuropathy, despite the patient not experiencing pain or numbness.   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   Malignant neoplasm of upper-outer quadrant of right breast in female, estrogen receptor positive (HCC) 09/10/2022   Rash and other nonspecific skin eruption 09/15/2020   Allergic contact dermatitis 09/15/2020   Pruritus 08/14/2020   Dizziness 05/10/2018   Atypical chest pain 05/10/2018   Primary osteoarthritis of left knee 01/06/2018   S/P knee replacement 08/12/2017   Incomplete tear of right rotator cuff 05/19/2017   Osteopenia 05/19/2017   Urge incontinence of urine 05/19/2017   Primary open angle glaucoma (POAG) 12/30/2016   Primary osteoarthritis of both knees 12/30/2016   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  MEDICAL HISTORY: Past Medical History:  Diagnosis Date   Anemia    Anxiety    Arthritis    Depressed    Dermatitis    rare form, on prednisone   GERD (gastroesophageal reflux disease)    controlled with Omeprazole   Glaucoma    Torn rotator cuff    RIGHT    Urinary tract infection    dx 05-26-17 took 1 week cipro BID completed 06-02-17.    SURGICAL HISTORY: Past Surgical History:  Procedure Laterality Date   ADENOIDECTOMY     APPENDECTOMY     arthroscopic right knee      BREAST BIOPSY Right 09/01/2022   MM RT BREAST BX W LOC DEV 1ST LESION IMAGE BX SPEC STEREO GUIDE 09/01/2022 [facility]   BREAST BIOPSY   10/01/2022   MM RT RADIOACTIVE SEED LOC MAMMO GUIDE 10/01/2022 [facility]   BREAST LUMPECTOMY WITH RADIOACTIVE SEED AND SENTINEL LYMPH NODE BIOPSY Right 10/05/2022   Procedure: RIGHT BREAST LUMPECTOMY WITH RADIOACTIVE SEED AND AXILLARY SENTINEL LYMPH NODE BIOPSY;  Surgeon: [name], MD;  Location: [facility];  Service: General;  Laterality: Right;   CHOLECYSTECTOMY     COLONOSCOPY  06/06/2017   Pyrtle   Pelvic sling     x2   POLYPECTOMY     PORT A CATH REVISION N/A 11/22/2022   Procedure: PORT A CATH REVISION;  Surgeon: [name], MD;  Location[facility];  Service: General;  Laterality: N/A;   PORTACATH PLACEMENT Left 10/05/2022   Procedure: INSERTION PORT-A-CATH;  Surgeon: [name], MD;  Location: [facility];  Service: General;  Laterality: Left;   ROTATOR CUFF REPAIR Right    TONSILLECTOMY     TOTAL KNEE ARTHROPLASTY Right 08/12/2017   Procedure: RIGHT TOTAL KNEE ARTHROPLASTY;  Surgeon: Eugenia Mcalpine, MD;  Location: [facility];  Service: Orthopedics;  Laterality: Right;   TOTAL KNEE ARTHROPLASTY Left 01/06/2018   Procedure: LEFT TOTAL KNEE ARTHROPLASTY;  Surgeon: Eugenia Mcalpine, MD;  Location: [facility];  Service: Orthopedics;  Laterality: Left;   UPPER GASTROINTESTINAL ENDOSCOPY     WISDOM TOOTH EXTRACTION      SOCIAL HISTORY: Social History  Socioeconomic History   Marital status: Single    Spouse name: Not on file   Number of children: Not on file   Years of education: Not on file   Highest education level: Not on file  Occupational History   Not on file  Tobacco Use   Smoking status: Former    Current packs/day: 0.00    Types: Cigarettes    Quit date: 40    Years since quitting: 38.7   Smokeless tobacco: Never  Vaping Use   Vaping status: Never Used  Substance and Sexual Activity   Alcohol use: Yes    Alcohol/week: 2.0 standard drinks of alcohol    Types: 2 Cans of beer per week   Drug use: No   Sexual  activity: Not on file  Other Topics Concern   Not on file  Social History Narrative   Not on file   Social Determinants of Health   Financial Resource Strain: Low Risk  (04/11/2023)   Received from [facility]   Overall Financial Resource Strain (CARDIA)    Difficulty of Paying Living Expenses: Not hard at all  Food Insecurity: No Food Insecurity (04/11/2023)   Received [facility]   Hunger Vital Sign    Worried About Running Out of Food in the Last Year: Never true    Ran Out of Food in the Last Year: Never true  Transportation Needs: No Transportation Needs (04/11/2023)   Received from [facility] - Transportation    Lack of Transportation (Medical): No    Lack of Transportation (Non-Medical): No  Physical Activity: Sufficiently Active (08/22/2022)   Received from [facility]   Exercise Vital Sign    Days of Exercise per Week: 3 days    Minutes of Exercise per Session: 50 min  Stress: No Stress Concern Present (07/28/2021)   Received from [facility] Occupational [facility] - Occupational Stress Questionnaire    Feeling of Stress : Not at all  Social Connections: Socially Integrated (08/22/2022)   Received from [facility]   Social Network    How would you rate your social network (family, work, friends)?: Good participation with social networks  Intimate Partner Violence: Not At Risk (08/22/2022)   Received from [facility], [facility]   HITS    Over the last 12 months how often did your partner physically hurt you?: 1    Over the last 12 months how often did your partner insult you or talk down to you?: 1    Over the last 12 months how often did your partner threaten you with physical harm?: 1    Over the last 12 months how often did your partner scream or curse at you?: 1    FAMILY HISTORY: Family History  Problem Relation Age of Onset   Bowel Disease Mother        ishemic  bowel   Lung cancer Mother        smoked   Cervical cancer Paternal Aunt    Cancer Maternal Grandfather        unknown type   Lung cancer Paternal Grandfather        smoked   Colon cancer Neg Hx    Breast cancer Neg Hx    Esophageal cancer Neg Hx    Pancreatic cancer Neg Hx    Prostate cancer Neg Hx    Rectal cancer Neg Hx    Stomach  cancer Neg Hx    Allergic rhinitis Neg Hx    Asthma Neg Hx    Eczema Neg Hx    Urticaria Neg Hx    Colon polyps Neg Hx     Review of Systems  Constitutional:  Negative for appetite change, chills, fatigue, fever and unexpected weight change.  HENT:   Negative for hearing loss, lump/mass and trouble swallowing.   Eyes:  Negative for eye problems and icterus.  Respiratory:  Negative for chest tightness, cough and shortness of breath.   Cardiovascular:  Negative for chest pain, leg swelling and palpitations.  Gastrointestinal:  Negative for abdominal distention, abdominal pain, constipation, diarrhea, nausea and vomiting.  Endocrine: Negative for hot flashes.  Genitourinary:  Negative for difficulty urinating.   Musculoskeletal:  Negative for arthralgias.  Skin:  Negative for itching and rash.  Neurological:  Negative for dizziness, extremity weakness, headaches and numbness.  Hematological:  Negative for adenopathy. Does not bruise/bleed easily.  Psychiatric/Behavioral:  Negative for depression. The patient is not nervous/anxious.       PHYSICAL EXAMINATION    Vitals:   06/20/23 1334  BP: 131/65  Pulse: (!) 102  Resp: 17  Temp: (!) 97.5 F (36.4 C)  SpO2: 97%    Physical Exam Constitutional:      General: She is not in acute distress.    Appearance: Normal appearance. She is not toxic-appearing.  HENT:     Head: Normocephalic and atraumatic.     Mouth/Throat:     Mouth: Mucous membranes are moist.     Pharynx: Oropharynx is clear. No oropharyngeal exudate or posterior oropharyngeal erythema.  Eyes:     General: No scleral  icterus. Cardiovascular:     Rate and Rhythm: Normal rate and regular rhythm.     Pulses: Normal pulses.     Heart sounds: Normal heart sounds.  Pulmonary:     Effort: Pulmonary effort is normal.     Breath sounds: Normal breath sounds.  Abdominal:     General: Abdomen is flat. Bowel sounds are normal. There is no distension.     Palpations: Abdomen is soft.     Tenderness: There is no abdominal tenderness.  Musculoskeletal:        General: No swelling.     Cervical back: Neck supple.  Lymphadenopathy:     Cervical: No cervical adenopathy.  Skin:    General: Skin is warm and dry.     Findings: No rash.  Neurological:     General: No focal deficit present.     Mental Status: She is alert.  Psychiatric:        Mood and Affect: Mood normal.        Behavior: Behavior normal.     LABORATORY DATA:  CBC    Component Value Date/Time   WBC 6.4 03/28/2023 0948   WBC 10.1 10/01/2021 1314   RBC 3.83 (L) 03/28/2023 0948   HGB 10.1 (L) 03/28/2023 0948   HGB 13.7 09/15/2020 1209   HCT 30.9 (L) 03/28/2023 0948   HCT 41.3 09/15/2020 1209   PLT 325 03/28/2023 0948   PLT 349 09/15/2020 1209   MCV 80.7 03/28/2023 0948   MCV 86 09/15/2020 1209   MCH 26.4 03/28/2023 0948   MCHC 32.7 03/28/2023 0948   RDW 16.2 (H) 03/28/2023 0948   RDW 12.3 09/15/2020 1209   LYMPHSABS 1.2 03/28/2023 0948   LYMPHSABS 1.2 09/15/2020 1209   MONOABS 0.8 03/28/2023 0948   EOSABS 0.3 03/28/2023 0948  EOSABS 0.2 09/15/2020 1209   BASOSABS 0.1 03/28/2023 0948   BASOSABS 0.1 09/15/2020 1209    CMP     Component Value Date/Time   NA 138 03/28/2023 0948   NA 135 09/15/2020 1209   K 3.9 03/28/2023 0948   CL 104 03/28/2023 0948   CO2 28 03/28/2023 0948   GLUCOSE 83 03/28/2023 0948   BUN 14 03/28/2023 0948   BUN 14 09/15/2020 1209   CREATININE 0.85 03/28/2023 0948   CALCIUM 9.2 03/28/2023 0948   PROT 6.6 03/28/2023 0948   PROT 6.8 09/15/2020 1209   ALBUMIN 4.0 03/28/2023 0948   ALBUMIN 4.6  09/15/2020 1209   AST 16 03/28/2023 0948   ALT 15 03/28/2023 0948   ALKPHOS 94 03/28/2023 0948   BILITOT 0.4 03/28/2023 0948   GFRNONAA >60 03/28/2023 0948   GFRAA 74 09/15/2020 1209      ASSESSMENT and THERAPY PLAN:   Malignant neoplasm of upper-outer quadrant of right breast in female, estrogen receptor positive (HCC) Lashon is a 72 year old woman with history of stage Ia triple positive breast cancer diagnosed in January 2024 status postlumpectomy and here today to begin adjuvant chemotherapy and immunotherapy with paclitaxel and trastuzumab.  Cardiac monitoring, will need ECHO done every 3 months. Last echo done in July, seen by Dr. Gasper Lloyd and recommendation was to continue Herceptin and repeat echo in 3 months.  Have sent an in basket message to cardiology regarding follow up.  Foot Sprain On prednisone and meloxicam for a foot sprain. The medications are temporary and will be discontinued after the current course. -Continue current course of prednisone and meloxicam as prescribed by the foot specialist.  Urinary Tract Infection (UTI) -Complete the course of Levofloxacin as prescribed.  Rectal Bleeding Reports occasional fresh blood on wiping, likely due to internal hemorrhoids as per previous colonoscopy report. No dark blood noted. -Continue monitoring. If the issue persists after the [place] trip, schedule an appointment with Dr. Heloise Purpura for further evaluation.  Cognitive issues, has followed up with Dr. Barbaraann Cao recently.  General Health Maintenance / Followup Plans -Continue taking Anastrozole. -Next Herceptin infusion scheduled for 07/11/2023. -Return for follow-up after [place] trip.   All questions were answered. The patient knows to call the clinic with any problems, questions or concerns. We can certainly see the patient much sooner if necessary.  Total encounter time:30 minutes*in face-to-face visit time, chart review, lab review, care coordination, order entry,  and documentation of the encounter time.  *Total Encounter Time as defined by the Centers for Medicare and Medicaid Services includes, in addition to the face-to-face time of a patient visit (documented in the note above) non-face-to-face time: obtaining and reviewing outside history, ordering and reviewing medications, tests or procedures, care coordination (communications with other health care professionals or caregivers) and documentation in the medical record.

## 2023-06-20 NOTE — Patient Instructions (Signed)
[facility]  Discharge Instructions: Thank you for choosing [facility] to provide your oncology and hematology care.   If you have a lab appointment with the Cancer Center, please go directly to the Cancer Center and check in at the registration area.   Wear comfortable clothing and clothing appropriate for easy access to any Portacath or PICC line.   We strive to give you quality time with your provider. You may need to reschedule your appointment if you arrive late (15 or more minutes).  Arriving late affects you and other patients whose appointments are after yours.  Also, if you miss three or more appointments without notifying the office, you may be dismissed from the clinic at the provider's discretion.      For prescription refill requests, have your pharmacy contact our office and allow 72 hours for refills to be completed.    Today you received the following chemotherapy and/or immunotherapy agents: Ontruzant      To help prevent nausea and vomiting after your treatment, we encourage you to take your nausea medication as directed.  BELOW ARE SYMPTOMS THAT SHOULD BE REPORTED IMMEDIATELY: *FEVER GREATER THAN 100.4 F (38 C) OR HIGHER *CHILLS OR SWEATING *NAUSEA AND VOMITING THAT IS NOT CONTROLLED WITH YOUR NAUSEA MEDICATION *UNUSUAL SHORTNESS OF BREATH *UNUSUAL BRUISING OR BLEEDING *URINARY PROBLEMS (pain or burning when urinating, or frequent urination) *BOWEL PROBLEMS (unusual diarrhea, constipation, pain near the anus) TENDERNESS IN MOUTH AND THROAT WITH OR WITHOUT PRESENCE OF ULCERS (sore throat, sores in mouth, or a toothache) UNUSUAL RASH, SWELLING OR PAIN  UNUSUAL VAGINAL DISCHARGE OR ITCHING   Items with * indicate a potential emergency and should be followed up as soon as possible or go to the Emergency Department if any problems should occur.  Please show the CHEMOTHERAPY ALERT CARD or IMMUNOTHERAPY ALERT CARD at  check-in to the Emergency Department and triage nurse.  Should you have questions after your visit or need to cancel or reschedule your appointment, please contact [facility]  Dept: [phone]  and follow the prompts.  Office hours are 8:00 a.m. to 4:30 p.m. Monday - Friday. Please note that voicemails left after 4:00 p.m. may not be returned until the following business day.  We are closed weekends and major holidays. You have access to a nurse at all times for urgent questions. Please call the main number to the clinic Dept: [phone] and follow the prompts.   For any non-urgent questions, you may also contact your provider using MyChart. We now offer e-Visits for anyone 18 and older to request care online for non-urgent symptoms. For details visit [email or web address].   Also download the MyChart app! Go to the app store, search "MyChart", open the app, select , and log in with your MyChart username and password.   

## 2023-06-20 NOTE — Assessment & Plan Note (Addendum)
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  Cardiac monitoring, will need ECHO done every 3 months. Last echo done in July, seen by Dr. Gasper Lloyd and recommendation was to continue Herceptin and repeat echo in 3 months.  Have sent an in basket message to cardiology regarding follow up.  Foot Sprain On prednisone and meloxicam for a foot sprain. The medications are temporary and will be discontinued after the current course. -Continue current course of prednisone and meloxicam as prescribed by the foot specialist.  Urinary Tract Infection (UTI) -Complete the course of Levofloxacin as prescribed.  Rectal Bleeding Reports occasional fresh blood on wiping, likely due to internal hemorrhoids as per previous colonoscopy report. No dark blood noted. -Continue monitoring. If the issue persists after the [place] trip, schedule an appointment with Dr. Heloise Purpura for further evaluation.  Cognitive issues, has followed up with Dr. Barbaraann Cao recently.  General Health Maintenance / Followup Plans -Continue taking Anastrozole. -Next Herceptin infusion scheduled for 07/11/2023. -Return for follow-up after [place] trip.

## 2023-06-27 ENCOUNTER — Ambulatory Visit
Admission: RE | Admit: 2023-06-27 | Discharge: 2023-06-27 | Disposition: A | Discharge status: Home / Self Care | Payer: Medicare Other | Source: Ambulatory Visit | Attending: Hematology and Oncology | Admitting: Hematology and Oncology

## 2023-06-27 DIAGNOSIS — Z170 Estrogen receptor positive status [ER+]: Secondary | ICD-10-CM | POA: Insufficient documentation

## 2023-06-27 DIAGNOSIS — C50411 Malignant neoplasm of upper-outer quadrant of right female breast: Secondary | ICD-10-CM | POA: Insufficient documentation

## 2023-06-27 NOTE — Progress Notes (Signed)
Radiation Oncology         [phone] ________________________________  Name: Judy Morrison MRN: [id number]  Date of Service: 06/27/2023  DOB: 1951-06-04  Post Treatment Telephone Note  Diagnosis:  Stage IA, pT1aN0M0, grade 2 triple positive invasive ductal carcinoma of the right breast. (as documented in provider EOT note)  The patient was available for call today.   Symptoms of fatigue have improved since completing therapy.  Symptoms of skin changes have improved since completing therapy.  The patient was encouraged to avoid sun exposure in the area of prior treatment for up to one year following radiation with either sunscreen or by the style of clothing worn in the sun.  The patient has scheduled follow up with her medical oncologist Dr. Al Pimple for ongoing surveillance, and was encouraged to call if she develops concerns or questions regarding radiation.  This concludes the interaction.  Ruel Favors, LPN

## 2023-07-11 ENCOUNTER — Encounter: Payer: Self-pay | Admitting: Hematology and Oncology

## 2023-07-11 ENCOUNTER — Ambulatory Visit: Payer: Medicare Other | Attending: General Surgery

## 2023-07-11 ENCOUNTER — Ambulatory Visit: Payer: Medicare Other | Admitting: Hematology and Oncology

## 2023-07-11 ENCOUNTER — Other Ambulatory Visit: Payer: Self-pay | Admitting: Hematology and Oncology

## 2023-07-11 ENCOUNTER — Inpatient Hospital Stay: Payer: Medicare Other

## 2023-07-11 ENCOUNTER — Encounter: Payer: Self-pay | Admitting: *Deleted

## 2023-07-11 ENCOUNTER — Other Ambulatory Visit: Payer: Self-pay

## 2023-07-11 VITALS — BP 140/74 | HR 97 | Temp 97.6°F | Resp 16 | Wt 165.5 lb

## 2023-07-11 VITALS — Wt 164.0 lb

## 2023-07-11 DIAGNOSIS — Z483 Aftercare following surgery for neoplasm: Secondary | ICD-10-CM | POA: Insufficient documentation

## 2023-07-11 DIAGNOSIS — Z5112 Encounter for antineoplastic immunotherapy: Secondary | ICD-10-CM | POA: Diagnosis not present

## 2023-07-11 DIAGNOSIS — Z170 Estrogen receptor positive status [ER+]: Secondary | ICD-10-CM

## 2023-07-11 MED ORDER — DIPHENHYDRAMINE HCL 25 MG PO CAPS
50.0000 mg | ORAL_CAPSULE | Freq: Once | ORAL | Status: AC
Start: 2023-07-11 — End: 2023-07-11
  Administered 2023-07-11: 50 mg via ORAL
  Filled 2023-07-11: qty 2

## 2023-07-11 MED ORDER — HEPARIN SOD (PORK) LOCK FLUSH 100 UNIT/ML IV SOLN
500.0000 [IU] | Freq: Once | INTRAVENOUS | Status: AC | PRN
Start: 2023-07-11 — End: 2023-07-11
  Administered 2023-07-11: 500 [IU]

## 2023-07-11 MED ORDER — ACETAMINOPHEN 325 MG PO TABS
650.0000 mg | ORAL_TABLET | Freq: Once | ORAL | Status: AC
Start: 2023-07-11 — End: 2023-07-11
  Administered 2023-07-11: 650 mg via ORAL
  Filled 2023-07-11: qty 2

## 2023-07-11 MED ORDER — SODIUM CHLORIDE 0.9 % IV SOLN
Freq: Once | INTRAVENOUS | Status: AC
Start: 2023-07-11 — End: 2023-07-11

## 2023-07-11 MED ORDER — TRASTUZUMAB-DTTB CHEMO 150 MG IV SOLR
6.0000 mg/kg | Freq: Once | INTRAVENOUS | Status: AC
  Administered 2023-07-11: 420 mg via INTRAVENOUS
  Filled 2023-07-11: qty 20

## 2023-07-11 NOTE — Patient Instructions (Signed)
[facility]  Discharge Instructions: Thank you for choosing [facility] to provide your oncology and hematology care.   If you have a lab appointment with the Cancer Center, please go directly to the Cancer Center and check in at the registration area.   Wear comfortable clothing and clothing appropriate for easy access to any Portacath or PICC line.   We strive to give you quality time with your provider. You may need to reschedule your appointment if you arrive late (15 or more minutes).  Arriving late affects you and other patients whose appointments are after yours.  Also, if you miss three or more appointments without notifying the office, you may be dismissed from the clinic at the provider's discretion.      For prescription refill requests, have your pharmacy contact our office and allow 72 hours for refills to be completed.    Today you received the following chemotherapy and/or immunotherapy agents: Ontruzant      To help prevent nausea and vomiting after your treatment, we encourage you to take your nausea medication as directed.  BELOW ARE SYMPTOMS THAT SHOULD BE REPORTED IMMEDIATELY: *FEVER GREATER THAN 100.4 F (38 C) OR HIGHER *CHILLS OR SWEATING *NAUSEA AND VOMITING THAT IS NOT CONTROLLED WITH YOUR NAUSEA MEDICATION *UNUSUAL SHORTNESS OF BREATH *UNUSUAL BRUISING OR BLEEDING *URINARY PROBLEMS (pain or burning when urinating, or frequent urination) *BOWEL PROBLEMS (unusual diarrhea, constipation, pain near the anus) TENDERNESS IN MOUTH AND THROAT WITH OR WITHOUT PRESENCE OF ULCERS (sore throat, sores in mouth, or a toothache) UNUSUAL RASH, SWELLING OR PAIN  UNUSUAL VAGINAL DISCHARGE OR ITCHING   Items with * indicate a potential emergency and should be followed up as soon as possible or go to the Emergency Department if any problems should occur.  Please show the CHEMOTHERAPY ALERT CARD or IMMUNOTHERAPY ALERT CARD at  check-in to the Emergency Department and triage nurse.  Should you have questions after your visit or need to cancel or reschedule your appointment, please contact [facility]  Dept: [phone]  and follow the prompts.  Office hours are 8:00 a.m. to 4:30 p.m. Monday - Friday. Please note that voicemails left after 4:00 p.m. may not be returned until the following business day.  We are closed weekends and major holidays. You have access to a nurse at all times for urgent questions. Please call the main number to the clinic Dept: [phone] and follow the prompts.   For any non-urgent questions, you may also contact your provider using MyChart. We now offer e-Visits for anyone 18 and older to request care online for non-urgent symptoms. For details visit [email or web address].   Also download the MyChart app! Go to the app store, search "MyChart", open the app, select , and log in with your MyChart username and password.   

## 2023-07-11 NOTE — Therapy (Signed)
OUTPATIENT PHYSICAL THERAPY SOZO SCREENING NOTE   Patient Name: Judy Morrison MRN: [id number] DOB:[birth year], 72 y.o., female Today's Date: 07/11/2023  PCP: Medicine, [facility] REFERRING PROVIDER: [name], MD   PT End of Session - 07/11/23 1046     Visit Number 4   # unchanged due to screen only   PT Start Time 1043    PT Stop Time 1048    PT Time Calculation (min) 5 min    Activity Tolerance Patient tolerated treatment well    Behavior During Therapy WFL for tasks assessed/performed             Past Medical History:  Diagnosis Date   Anemia    Anxiety    Arthritis    Depressed    Dermatitis    rare form, on prednisone   GERD (gastroesophageal reflux disease)    controlled with Omeprazole   Glaucoma    Torn rotator cuff    RIGHT    Urinary tract infection    dx 05-26-17 took 1 week cipro BID completed 06-02-17.   Past Surgical History:  Procedure Laterality Date   ADENOIDECTOMY     APPENDECTOMY     arthroscopic right knee      BREAST BIOPSY Right 09/01/2022   MM RT BREAST BX W LOC DEV 1ST LESION IMAGE BX SPEC STEREO GUIDE 09/01/2022 [facility]   BREAST BIOPSY  10/01/2022   MM RT RADIOACTIVE SEED LOC MAMMO GUIDE 10/01/2022 [facility]   BREAST LUMPECTOMY WITH RADIOACTIVE SEED AND SENTINEL LYMPH NODE BIOPSY Right 10/05/2022   Procedure: RIGHT BREAST LUMPECTOMY WITH RADIOACTIVE SEED AND AXILLARY SENTINEL LYMPH NODE BIOPSY;  Surgeon: [name], MD;  Location: [facility];  Service: General;  Laterality: Right;   CHOLECYSTECTOMY     COLONOSCOPY  06/06/2017   Pyrtle   Pelvic sling     x2   POLYPECTOMY     PORT A CATH REVISION N/A 11/22/2022   Procedure: PORT A CATH REVISION;  Surgeon: [name], MD;  Location: [facility];  Service: General;  Laterality: N/A;   PORTACATH PLACEMENT Left 10/05/2022   Procedure: INSERTION PORT-A-CATH;  Surgeon: [name], MD;  Location: [facility];  Service: General;  Laterality: Left;   ROTATOR CUFF REPAIR Right    TONSILLECTOMY     TOTAL KNEE ARTHROPLASTY Right 08/12/2017   Procedure: RIGHT TOTAL KNEE ARTHROPLASTY;  Surgeon: Eugenia Mcalpine, MD;  Location: [facility];  Service: Orthopedics;  Laterality: Right;   TOTAL KNEE ARTHROPLASTY Left 01/06/2018   Procedure: LEFT TOTAL KNEE ARTHROPLASTY;  Surgeon: Eugenia Mcalpine, MD;  Location: [facility];  Service: Orthopedics;  Laterality: Left;   UPPER GASTROINTESTINAL ENDOSCOPY     WISDOM TOOTH EXTRACTION     Patient Active Problem List   Diagnosis Date Noted   Cognitive changes 05/02/2023   Port-A-Cath in place 11/15/2022   Genetic testing 10/04/2022   Monoallelic mutation of ATM gene 09/15/7251   Malignant neoplasm of upper-outer quadrant of right breast in female, estrogen receptor positive (HCC) 09/10/2022   Rash and other nonspecific skin eruption 09/15/2020   Allergic contact dermatitis 09/15/2020   Pruritus 08/14/2020   Dizziness 05/10/2018   Atypical chest pain 05/10/2018   Primary osteoarthritis of left knee 01/06/2018   S/P knee replacement 08/12/2017   Incomplete tear of right rotator cuff 05/19/2017   Osteopenia 05/19/2017   Urge incontinence of urine 05/19/2017   Primary open angle glaucoma (POAG) 12/30/2016   Primary osteoarthritis of  both knees 12/30/2016   Recurrent oral herpes simplex infection 12/30/2016   Tubular adenoma of colon 12/30/2016   Hypothyroidism 06/25/2016   Right foot pain 12/19/2014   Anxiety 05/21/2011   Seasonal allergies 05/21/2011    REFERRING DIAG: right breast cancer at risk for lymphedema  THERAPY DIAG: Aftercare following surgery for neoplasm  PERTINENT HISTORY: Patient was diagnosed on 08/02/2022 with right grade 2 invasive ductal carcinoma breast cancer. It measures 1.5 cm and is located in the upper outer quadrant. It is triple positive with a Ki67 of 5%. She had a rotator cuff repair in her right shoulder 10/01/2021 which  continues to be problematic. She had a right lumpectomy with SLNB on 10/05/2022   PRECAUTIONS: right UE Lymphedema risk, None  SUBJECTIVE: Pt returns for her 3 month L-Dex screen .  PAIN:  Are you having pain? No  SOZO SCREENING: Patient was assessed today using the SOZO machine to determine the lymphedema index score. This was compared to her baseline score. It was determined that she is within the recommended range when compared to her baseline and no further action is needed at this time. She will continue SOZO screenings. These are done every 3 months for 2 years post operatively followed by every 6 months for 2 years, and then annually.   L-DEX FLOWSHEETS - 07/11/23 1000       L-DEX LYMPHEDEMA SCREENING   Measurement Type Unilateral    L-DEX MEASUREMENT EXTREMITY Upper Extremity    POSITION  Standing    DOMINANT SIDE Right    At Risk Side Right    BASELINE SCORE (UNILATERAL) -3.4    L-DEX SCORE (UNILATERAL) -4.4    VALUE CHANGE (UNILAT) -1              Hermenia Bers, PTA 07/11/2023, 10:48 AM

## 2023-07-12 ENCOUNTER — Other Ambulatory Visit: Payer: Self-pay

## 2023-08-01 ENCOUNTER — Inpatient Hospital Stay: Payer: Medicare Other | Attending: Hematology and Oncology | Admitting: Hematology and Oncology

## 2023-08-01 ENCOUNTER — Ambulatory Visit: Payer: Medicare Other

## 2023-08-01 ENCOUNTER — Ambulatory Visit: Payer: Medicare Other | Admitting: Hematology and Oncology

## 2023-08-01 ENCOUNTER — Inpatient Hospital Stay: Payer: Medicare Other

## 2023-08-01 VITALS — BP 143/66 | HR 87 | Temp 98.2°F | Resp 16 | Wt 171.6 lb

## 2023-08-01 DIAGNOSIS — Z1721 Progesterone receptor positive status: Secondary | ICD-10-CM | POA: Insufficient documentation

## 2023-08-01 DIAGNOSIS — C50411 Malignant neoplasm of upper-outer quadrant of right female breast: Secondary | ICD-10-CM | POA: Diagnosis not present

## 2023-08-01 DIAGNOSIS — Z79811 Long term (current) use of aromatase inhibitors: Secondary | ICD-10-CM | POA: Insufficient documentation

## 2023-08-01 DIAGNOSIS — Z87891 Personal history of nicotine dependence: Secondary | ICD-10-CM | POA: Insufficient documentation

## 2023-08-01 DIAGNOSIS — Z170 Estrogen receptor positive status [ER+]: Secondary | ICD-10-CM | POA: Insufficient documentation

## 2023-08-01 DIAGNOSIS — Z5112 Encounter for antineoplastic immunotherapy: Secondary | ICD-10-CM | POA: Diagnosis present

## 2023-08-01 MED ORDER — SODIUM CHLORIDE 0.9 % IV SOLN
Freq: Once | INTRAVENOUS | Status: AC
Start: 2023-08-01 — End: 2023-08-01

## 2023-08-01 MED ORDER — TRASTUZUMAB-DTTB CHEMO 150 MG IV SOLR
6.0000 mg/kg | Freq: Once | INTRAVENOUS | Status: AC
  Administered 2023-08-01: 420 mg via INTRAVENOUS
  Filled 2023-08-01: qty 20

## 2023-08-01 MED ORDER — DIPHENHYDRAMINE HCL 25 MG PO CAPS
50.0000 mg | ORAL_CAPSULE | Freq: Once | ORAL | Status: AC
Start: 2023-08-01 — End: 2023-08-01
  Administered 2023-08-01: 50 mg via ORAL
  Filled 2023-08-01: qty 2

## 2023-08-01 MED ORDER — ACETAMINOPHEN 325 MG PO TABS
650.0000 mg | ORAL_TABLET | Freq: Once | ORAL | Status: AC
Start: 2023-08-01 — End: 2023-08-01
  Administered 2023-08-01: 650 mg via ORAL
  Filled 2023-08-01: qty 2

## 2023-08-01 NOTE — Patient Instructions (Signed)
 [facility] - A DEPT OF [facility]  Discharge Instructions: Thank you for choosing [facility] to provide your oncology and hematology care.   If you have a lab appointment with the Cancer Center, please go directly to the Cancer Center and check in at the registration area.   Wear comfortable clothing and clothing appropriate for easy access to any Portacath or PICC line.   We strive to give you quality time with your provider. You may need to reschedule your appointment if you arrive late (15 or more minutes).  Arriving late affects you and other patients whose appointments are after yours.  Also, if you miss three or more appointments without notifying the office, you may be dismissed from the clinic at the provider's discretion.      For prescription refill requests, have your pharmacy contact our office and allow 72 hours for refills to be completed.    Today you received the following chemotherapy and/or immunotherapy agents herceptin      To help prevent nausea and vomiting after your treatment, we encourage you to take your nausea medication as directed.  BELOW ARE SYMPTOMS THAT SHOULD BE REPORTED IMMEDIATELY: *FEVER GREATER THAN 100.4 F (38 C) OR HIGHER *CHILLS OR SWEATING *NAUSEA AND VOMITING THAT IS NOT CONTROLLED WITH YOUR NAUSEA MEDICATION *UNUSUAL SHORTNESS OF BREATH *UNUSUAL BRUISING OR BLEEDING *URINARY PROBLEMS (pain or burning when urinating, or frequent urination) *BOWEL PROBLEMS (unusual diarrhea, constipation, pain near the anus) TENDERNESS IN MOUTH AND THROAT WITH OR WITHOUT PRESENCE OF ULCERS (sore throat, sores in mouth, or a toothache) UNUSUAL RASH, SWELLING OR PAIN  UNUSUAL VAGINAL DISCHARGE OR ITCHING   Items with * indicate a potential emergency and should be followed up as soon as possible or go to the Emergency Department if any problems should occur.  Please show the CHEMOTHERAPY ALERT CARD or IMMUNOTHERAPY  ALERT CARD at check-in to the Emergency Department and triage nurse.  Should you have questions after your visit or need to cancel or reschedule your appointment, please contact [facility] - A DEPT OF [facility]  Dept: [phone]  and follow the prompts.  Office hours are 8:00 a.m. to 4:30 p.m. Monday - Friday. Please note that voicemails left after 4:00 p.m. may not be returned until the following business day.  We are closed weekends and major holidays. You have access to a nurse at all times for urgent questions. Please call the main number to the clinic Dept: [phone] and follow the prompts.   For any non-urgent questions, you may also contact your provider using MyChart. We now offer e-Visits for anyone 29 and older to request care online for non-urgent symptoms. For details visit [email or web address].   Also download the MyChart app! Go to the app store, search "MyChart", open the app, select [place], and log in with your MyChart username and password.

## 2023-08-01 NOTE — Progress Notes (Signed)
[facility] Cancer Follow up:    Medicine, [facility] [address] Judy Raring [place] [address]   DIAGNOSIS:  Cancer Staging  Malignant neoplasm of upper-outer quadrant of right breast in female, estrogen receptor positive (HCC) Staging form: Breast, AJCC 8th Edition - Pathologic stage from 10/05/2022: Stage IA (pT1c, pN0, cM0, G2, ER+, PR+, HER2+) - Signed by Loa Socks, NP on 11/08/2022 Stage prefix: Initial diagnosis Histologic grading system: 3 grade system   SUMMARY OF ONCOLOGIC HISTORY: Oncology History  Malignant neoplasm of upper-outer quadrant of right breast in female, estrogen receptor positive (HCC)  08/02/2022 Mammogram   In the right breast, possible distortion warrants further evaluation. In the left breast, no findings suspicious for malignancy. Diagnostic mammogram showed persistent subtle distortion central slightly LATERAL aspect of the RIGHT breast warranting tissue diagnosis.     09/01/2022 Pathology Results   Final pathology showed grade 2 invasive mammary carcinoma, prognostic showed ER 90% positive strong staining PR 90% positive strong staining, Ki-67 of 5% and HER2 3+ by [facility]   09/10/2022 Initial Diagnosis   Malignant neoplasm of upper-outer quadrant of right breast in female, estrogen receptor positive (HCC)    Genetic Testing   [facility] CancerNext-Expanded Panel+RNA was Positive. 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 Report date is 10/04/2022.  The CancerNext-Expanded gene panel offered by [facility] and includes sequencing, rearrangement, and RNA analysis for the following 77 genes: AIP, ALK, APC, ATM, AXIN2, BAP1, BARD1, BLM, BMPR1A, BRCA1, BRCA2, BRIP1, CDC73, CDH1, CDK4, CDKN1B, CDKN2A, CHEK2, CTNNA1, DICER1, FANCC, FH, FLCN, GALNT12, KIF1B, LZTR1, MAX, MEN1, MET, MLH1, MSH2, MSH3, MSH6,  MUTYH, NBN, NF1, NF2, NTHL1, PALB2, PHOX2B, PMS2, POT1, PRKAR1A, PTCH1, PTEN, RAD51C, RAD51D, RB1, RECQL, RET, SDHA, SDHAF2, SDHB, SDHC, SDHD, SMAD4, SMARCA4, SMARCB1, SMARCE1, STK11, SUFU, TMEM127, TP53, TSC1, TSC2, VHL and XRCC2 (sequencing and deletion/duplication); EGFR, EGLN1, HOXB13, KIT, MITF, PDGFRA, POLD1, and POLE (sequencing only); EPCAM and GREM1 (deletion/duplication only).    10/05/2022 Surgery   Right breast lumpectomy with Dr. 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  Grade 2, 2 sentinel lymph nodes negative for metastasis.  Margins negative.  T1c, N0.   10/05/2022 Cancer Staging   Staging form: Breast, AJCC 8th Edition - Pathologic stage from 10/05/2022: Stage IA (pT1c, pN0, cM0, G2, ER+, PR+, HER2+) - Signed by Loa Socks, NP on 11/08/2022 Stage prefix: Initial diagnosis Histologic grading system: 3 grade system   11/08/2022 -  Chemotherapy   Patient is on Treatment Plan : BREAST Paclitaxel + Trastuzumab q7d / Trastuzumab q21d     03/23/2023 - 04/19/2023 Radiation Therapy   Adjuvant radiation     CURRENT THERAPY: Herceptin  INTERVAL HISTORY: Judy Morrison 72 y.o. female returns for f/u and evaluation of her breast cancer on treatment with Herceptin and anastrozole.  She has been tolerating the anastrozole well since starting it and denies any side effects.  Last echocardiogram occurred on April 05, 2023 demonstrating an LVEF of 50 to 55%.  She was seen by Dr. Gasper Lloyd that day as well and was recommended to continue treatment.   She has been doing well overall.  She is planning a trip to [place] first week of December.  She likes to travel with her friends.  Since her last visit here, memory has been okay except when she is stressed or did not sleep well.  She denies any chest pain, chest pressure, shortness of breath, lower extremity swelling.  She has not had back from the cardiologist.  She denies any adverse effects with  anastrozole.  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   Malignant neoplasm of upper-outer quadrant of right breast in female, estrogen receptor positive (HCC) 09/10/2022   Rash and other nonspecific skin eruption 09/15/2020   Allergic contact dermatitis 09/15/2020   Pruritus 08/14/2020   Dizziness 05/10/2018   Atypical chest pain 05/10/2018   Primary osteoarthritis of left knee 01/06/2018   S/P knee replacement 08/12/2017   Incomplete tear of right rotator cuff 05/19/2017   Osteopenia 05/19/2017   Urge incontinence of urine 05/19/2017   Primary open angle glaucoma (POAG) 12/30/2016   Primary osteoarthritis of both knees 12/30/2016   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  MEDICAL HISTORY: Past Medical History:  Diagnosis Date   Anemia    Anxiety    Arthritis    Depressed    Dermatitis    rare form, on prednisone   GERD (gastroesophageal reflux disease)    controlled with Omeprazole   Glaucoma    Torn rotator cuff    RIGHT    Urinary tract infection    dx 05-26-17 took 1 week cipro BID completed 06-02-17.    SURGICAL HISTORY: Past Surgical History:  Procedure Laterality Date   ADENOIDECTOMY     APPENDECTOMY     arthroscopic right knee      BREAST BIOPSY Right 09/01/2022   MM RT BREAST BX W LOC DEV 1ST LESION IMAGE BX SPEC STEREO GUIDE 09/01/2022 [facility]   BREAST BIOPSY  10/01/2022   MM RT RADIOACTIVE SEED LOC MAMMO GUIDE 10/01/2022 [facility]   BREAST LUMPECTOMY WITH RADIOACTIVE SEED AND SENTINEL LYMPH NODE  BIOPSY Right 10/05/2022   Procedure: RIGHT BREAST LUMPECTOMY WITH RADIOACTIVE SEED AND AXILLARY SENTINEL LYMPH NODE BIOPSY;  Surgeon: [name], MD;  Location: [facility];  Service: General;  Laterality: Right;   CHOLECYSTECTOMY     COLONOSCOPY  06/06/2017   Pyrtle   Pelvic sling     x2   POLYPECTOMY     PORT A CATH REVISION N/A 11/22/2022   Procedure: PORT A CATH REVISION;  Surgeon: [name], MD;  Location: [facility] OR;  Service: General;  Laterality: N/A;   PORTACATH PLACEMENT Left 10/05/2022   Procedure: INSERTION PORT-A-CATH;  Surgeon: [name], MD;  Location: [facility];  Service: General;  Laterality: Left;   ROTATOR CUFF REPAIR Right    TONSILLECTOMY     TOTAL KNEE ARTHROPLASTY Right 08/12/2017   Procedure: RIGHT TOTAL KNEE ARTHROPLASTY;  Surgeon: Eugenia Mcalpine, MD;  Location: [facility];  Service: Orthopedics;  Laterality: Right;   TOTAL KNEE ARTHROPLASTY Left 01/06/2018   Procedure: LEFT TOTAL KNEE ARTHROPLASTY;  Surgeon: Eugenia Mcalpine, MD;  Location: [facility];  Service: Orthopedics;  Laterality: Left;   UPPER GASTROINTESTINAL ENDOSCOPY     WISDOM TOOTH EXTRACTION      SOCIAL HISTORY: Social History   Socioeconomic History   Marital status:  Single    Spouse name: Not on file   Number of children: Not on file   Years of education: Not on file   Highest education level: Not on file  Occupational History   Not on file  Tobacco Use   Smoking status: Former    Current packs/day: 0.00    Types: Cigarettes    Quit date: 22    Years since quitting: 38.9   Smokeless tobacco: Never  Vaping Use   Vaping status: Never Used  Substance and Sexual Activity   Alcohol use: Yes    Alcohol/week: 2.0 standard drinks of alcohol    Types: 2 Cans of beer per week   Drug use: No   Sexual activity: Not on file  Other Topics Concern   Not on file  Social History Narrative   Not on file   Social Determinants of Health   Financial  Resource Strain: Low Risk  (04/11/2023)   Received from [facility]   Overall Financial Resource Strain (CARDIA)    Difficulty of Paying Living Expenses: Not hard at all  Food Insecurity: No Food Insecurity (04/11/2023)   Received from [facility]   Hunger Vital Sign    Worried About Running Out of Food in the Last Year: Never true    Ran Out of Food in the Last Year: Never true  Transportation Needs: No Transportation Needs (04/11/2023)   Received from [facility] - Transportation    Lack of Transportation (Medical): No    Lack of Transportation (Non-Medical): No  Physical Activity: Sufficiently Active (08/22/2022)   Received from [facility]   Exercise Vital Sign    Days of Exercise per Week: 3 days    Minutes of Exercise per Session: 50 min  Stress: No Stress Concern Present (07/28/2021)   Received from [facility] - Occupational Stress Questionnaire    Feeling of Stress : Not at all  Social Connections: Socially Integrated (08/22/2022)   Received from [facility]   Social Network    How would you rate your social network (family, work, friends)?: Good participation with social networks  Intimate Partner Violence: Not At Risk (08/22/2022)   Received from [facility]   HITS    Over the last 12 months how often did your partner physically hurt you?: Never    Over the last 12 months how often did your partner insult you or talk down to you?: Never    Over the last 12 months how often did your partner threaten you with physical harm?: Never    Over the last 12 months how often did your partner scream or curse at you?: Never    FAMILY HISTORY: Family History  Problem Relation Age of Onset   Bowel Disease Mother        ishemic bowel   Lung cancer Mother        smoked   Cervical cancer Paternal Aunt    Cancer Maternal Grandfather        unknown type    Lung cancer Paternal Grandfather        smoked   Colon cancer Neg Hx    Breast cancer Neg Hx    Esophageal cancer Neg Hx    Pancreatic cancer Neg Hx    Prostate cancer Neg Hx    Rectal cancer Neg Hx    Stomach cancer Neg Hx  Allergic rhinitis Neg Hx    Asthma Neg Hx    Eczema Neg Hx    Urticaria Neg Hx    Colon polyps Neg Hx     Review of Systems  Constitutional:  Negative for appetite change, chills, fatigue, fever and unexpected weight change.  HENT:   Negative for hearing loss, lump/mass and trouble swallowing.   Eyes:  Negative for eye problems and icterus.  Respiratory:  Negative for chest tightness, cough and shortness of breath.   Cardiovascular:  Negative for chest pain, leg swelling and palpitations.  Gastrointestinal:  Negative for abdominal distention, abdominal pain, constipation, diarrhea, nausea and vomiting.  Endocrine: Negative for hot flashes.  Genitourinary:  Negative for difficulty urinating.   Musculoskeletal:  Negative for arthralgias.  Skin:  Negative for itching and rash.  Neurological:  Negative for dizziness, extremity weakness, headaches and numbness.  Hematological:  Negative for adenopathy. Does not bruise/bleed easily.  Psychiatric/Behavioral:  Negative for depression. The patient is not nervous/anxious.       PHYSICAL EXAMINATION    Vitals:   08/01/23 1306  BP: (!) 143/66  Pulse: 87  Resp: 16  Temp: 98.2 F (36.8 C)  SpO2: 98%     Physical Exam Constitutional:      General: She is not in acute distress.    Appearance: Normal appearance. She is not toxic-appearing.  HENT:     Head: Normocephalic and atraumatic.     Mouth/Throat:     Mouth: Mucous membranes are moist.     Pharynx: Oropharynx is clear. No oropharyngeal exudate or posterior oropharyngeal erythema.  Eyes:     General: No scleral icterus. Cardiovascular:     Rate and Rhythm: Normal rate and regular rhythm.     Pulses: Normal pulses.     Heart sounds: Normal heart  sounds.  Pulmonary:     Effort: Pulmonary effort is normal.     Breath sounds: Normal breath sounds.  Abdominal:     General: Abdomen is flat. Bowel sounds are normal. There is no distension.     Palpations: Abdomen is soft.     Tenderness: There is no abdominal tenderness.  Musculoskeletal:        General: No swelling.     Cervical back: Neck supple.  Lymphadenopathy:     Cervical: No cervical adenopathy.  Skin:    General: Skin is warm and dry.     Findings: No rash.  Neurological:     General: No focal deficit present.     Mental Status: She is alert.  Psychiatric:        Mood and Affect: Mood normal.        Behavior: Behavior normal.     LABORATORY DATA:  CBC    Component Value Date/Time   WBC 6.4 03/28/2023 0948   WBC 10.1 10/01/2021 1314   RBC 3.83 (L) 03/28/2023 0948   HGB 10.1 (L) 03/28/2023 0948   HGB 13.7 09/15/2020 1209   HCT 30.9 (L) 03/28/2023 0948   HCT 41.3 09/15/2020 1209   PLT 325 03/28/2023 0948   PLT 349 09/15/2020 1209   MCV 80.7 03/28/2023 0948   MCV 86 09/15/2020 1209   MCH 26.4 03/28/2023 0948   MCHC 32.7 03/28/2023 0948   RDW 16.2 (H) 03/28/2023 0948   RDW 12.3 09/15/2020 1209   LYMPHSABS 1.2 03/28/2023 0948   LYMPHSABS 1.2 09/15/2020 1209   MONOABS 0.8 03/28/2023 0948   EOSABS 0.3 03/28/2023 0948   EOSABS 0.2 09/15/2020 1209  BASOSABS 0.1 03/28/2023 0948   BASOSABS 0.1 09/15/2020 1209    CMP     Component Value Date/Time   NA 138 03/28/2023 0948   NA 135 09/15/2020 1209   K 3.9 03/28/2023 0948   CL 104 03/28/2023 0948   CO2 28 03/28/2023 0948   GLUCOSE 83 03/28/2023 0948   BUN 14 03/28/2023 0948   BUN 14 09/15/2020 1209   CREATININE 0.85 03/28/2023 0948   CALCIUM 9.2 03/28/2023 0948   PROT 6.6 03/28/2023 0948   PROT 6.8 09/15/2020 1209   ALBUMIN 4.0 03/28/2023 0948   ALBUMIN 4.6 09/15/2020 1209   AST 16 03/28/2023 0948   ALT 15 03/28/2023 0948   ALKPHOS 94 03/28/2023 0948   BILITOT 0.4 03/28/2023 0948   GFRNONAA >60  03/28/2023 0948   GFRAA 74 09/15/2020 1209      ASSESSMENT and THERAPY PLAN:   Malignant neoplasm of upper-outer quadrant of right breast in female, estrogen receptor positive (HCC) Morrison is a 72 year old woman with history of stage Ia triple positive breast cancer diagnosed in January 2024 status postlumpectomy and here today to begin adjuvant chemotherapy and immunotherapy with paclitaxel and trastuzumab.  She is currently on Herceptin alone, will complete this in early March 2025.   Breast Cancer On Anastrozole and Herceptin, with Herceptin to be completed on November 22, 2023. No new symptoms reported. -Continue Anastrozole and Herceptin as prescribed. -Reschedule Herceptin treatment from August 22, 2023, to August 30, 2023, due to patient's travel plans.  Urinary Tract Infections No recent UTIs reported. Last UTI was prior to the current visit. -Continue current management.  Cardiac Function Last seen by cardiologist on April 05, 2023. No new cardiac symptoms reported. -Contact cardiology to clarify ejection fraction. -Schedule follow-up echocardiogram as ordered by cardiologist.  Return to clinic as scheduled  All questions were answered. The patient knows to call the clinic with any problems, questions or concerns. We can certainly see the patient much sooner if necessary.  Total encounter time:30 minutes*in face-to-face visit time, chart review, lab review, care coordination, order entry, and documentation of the encounter time.  *Total Encounter Time as defined by the Centers for Medicare and Medicaid Services includes, in addition to the face-to-face time of a patient visit (documented in the note above) non-face-to-face time: obtaining and reviewing outside history, ordering and reviewing medications, tests or procedures, care coordination (communications with other health care professionals or caregivers) and documentation in the medical record.

## 2023-08-01 NOTE — Assessment & Plan Note (Signed)
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  She is currently on Herceptin alone, will complete this in early March 2025.   Breast Cancer On Anastrozole and Herceptin, with Herceptin to be completed on November 22, 2023. No new symptoms reported. -Continue Anastrozole and Herceptin as prescribed. -Reschedule Herceptin treatment from August 22, 2023, to August 30, 2023, due to patient's travel plans.  Urinary Tract Infections No recent UTIs reported. Last UTI was prior to the current visit. -Continue current management.  Cardiac Function Last seen by cardiologist on April 05, 2023. No new cardiac symptoms reported. -Contact cardiology to clarify ejection fraction. -Schedule follow-up echocardiogram as ordered by cardiologist.  Return to clinic as scheduled

## 2023-08-16 ENCOUNTER — Telehealth: Payer: Self-pay | Admitting: Hematology and Oncology

## 2023-08-19 ENCOUNTER — Ambulatory Visit (HOSPITAL_COMMUNITY)
Admission: RE | Admit: 2023-08-19 | Discharge: 2023-08-19 | Disposition: A | Discharge status: Home / Self Care | Payer: Medicare Other | Source: Ambulatory Visit | Attending: Cardiology | Admitting: Cardiology

## 2023-08-19 DIAGNOSIS — I5089 Other heart failure: Secondary | ICD-10-CM | POA: Diagnosis not present

## 2023-08-19 DIAGNOSIS — I351 Nonrheumatic aortic (valve) insufficiency: Secondary | ICD-10-CM | POA: Insufficient documentation

## 2023-08-19 LAB — ECHOCARDIOGRAM COMPLETE
Area-P 1/2: 4 cm2
Calc EF: 60.5 %
P 1/2 time: 440 ms
S' Lateral: 3 cm
Single Plane A2C EF: 60.5 %
Single Plane A4C EF: 63.3 %

## 2023-08-22 ENCOUNTER — Ambulatory Visit: Payer: Medicare Other

## 2023-08-22 ENCOUNTER — Ambulatory Visit: Payer: Medicare Other | Admitting: Adult Health

## 2023-08-30 ENCOUNTER — Inpatient Hospital Stay: Payer: Medicare Other | Attending: Hematology and Oncology | Admitting: Adult Health

## 2023-08-30 ENCOUNTER — Inpatient Hospital Stay: Payer: Medicare Other

## 2023-08-30 ENCOUNTER — Inpatient Hospital Stay: Payer: Medicare Other | Attending: Hematology and Oncology

## 2023-08-30 ENCOUNTER — Encounter: Payer: Self-pay | Admitting: Adult Health

## 2023-08-30 VITALS — BP 134/71 | HR 92 | Temp 98.4°F | Resp 16 | Wt 175.2 lb

## 2023-08-30 VITALS — BP 144/74 | HR 89 | Temp 98.5°F | Resp 17 | Wt 176.1 lb

## 2023-08-30 DIAGNOSIS — I10 Essential (primary) hypertension: Secondary | ICD-10-CM

## 2023-08-30 DIAGNOSIS — Z1731 Human epidermal growth factor receptor 2 positive status: Secondary | ICD-10-CM | POA: Diagnosis not present

## 2023-08-30 DIAGNOSIS — Z5112 Encounter for antineoplastic immunotherapy: Secondary | ICD-10-CM | POA: Diagnosis present

## 2023-08-30 DIAGNOSIS — Z170 Estrogen receptor positive status [ER+]: Secondary | ICD-10-CM

## 2023-08-30 DIAGNOSIS — Z1721 Progesterone receptor positive status: Secondary | ICD-10-CM | POA: Insufficient documentation

## 2023-08-30 DIAGNOSIS — Z79811 Long term (current) use of aromatase inhibitors: Secondary | ICD-10-CM | POA: Diagnosis not present

## 2023-08-30 DIAGNOSIS — C50411 Malignant neoplasm of upper-outer quadrant of right female breast: Secondary | ICD-10-CM | POA: Diagnosis not present

## 2023-08-30 LAB — CBC WITH DIFFERENTIAL (CANCER CENTER ONLY)
Abs Immature Granulocytes: 0.02 10*3/uL (ref 0.00–0.07)
Basophils Absolute: 0.1 10*3/uL (ref 0.0–0.1)
Basophils Relative: 1 %
Eosinophils Absolute: 0.3 10*3/uL (ref 0.0–0.5)
Eosinophils Relative: 4 %
HCT: 32.5 % — ABNORMAL LOW (ref 36.0–46.0)
Hemoglobin: 10.3 g/dL — ABNORMAL LOW (ref 12.0–15.0)
Immature Granulocytes: 0 %
Lymphocytes Relative: 15 %
Lymphs Abs: 1.1 10*3/uL (ref 0.7–4.0)
MCH: 25.3 pg — ABNORMAL LOW (ref 26.0–34.0)
MCHC: 31.7 g/dL (ref 30.0–36.0)
MCV: 79.9 fL — ABNORMAL LOW (ref 80.0–100.0)
Monocytes Absolute: 0.8 10*3/uL (ref 0.1–1.0)
Monocytes Relative: 10 %
Neutro Abs: 5.1 10*3/uL (ref 1.7–7.7)
Neutrophils Relative %: 70 %
Platelet Count: 330 10*3/uL (ref 150–400)
RBC: 4.07 MIL/uL (ref 3.87–5.11)
RDW: 16.5 % — ABNORMAL HIGH (ref 11.5–15.5)
WBC Count: 7.3 10*3/uL (ref 4.0–10.5)
nRBC: 0 % (ref 0.0–0.2)

## 2023-08-30 LAB — CMP (CANCER CENTER ONLY)
ALT: 20 U/L (ref 0–44)
AST: 20 U/L (ref 15–41)
Albumin: 4.1 g/dL (ref 3.5–5.0)
Alkaline Phosphatase: 105 U/L (ref 38–126)
Anion gap: 6 (ref 5–15)
BUN: 11 mg/dL (ref 8–23)
CO2: 30 mmol/L (ref 22–32)
Calcium: 9.3 mg/dL (ref 8.9–10.3)
Chloride: 100 mmol/L (ref 98–111)
Creatinine: 0.82 mg/dL (ref 0.44–1.00)
GFR, Estimated: >60 mL/min (ref >60)
Glucose, Bld: 111 mg/dL — ABNORMAL HIGH (ref 70–99)
Potassium: 4.3 mmol/L (ref 3.5–5.1)
Sodium: 136 mmol/L (ref 135–145)
Total Bilirubin: 0.4 mg/dL (ref <1.2)
Total Protein: 6.5 g/dL (ref 6.5–8.1)

## 2023-08-30 LAB — D-DIMER, QUANTITATIVE: D-Dimer, Quant: 1.53 ug{FEU}/mL — ABNORMAL HIGH (ref 0.00–0.50)

## 2023-08-30 MED ORDER — ACETAMINOPHEN 325 MG PO TABS
650.0000 mg | ORAL_TABLET | Freq: Once | ORAL | Status: AC
  Administered 2023-08-30: 650 mg via ORAL
  Filled 2023-08-30: qty 2

## 2023-08-30 MED ORDER — SODIUM CHLORIDE 0.9 % IV SOLN
6.0000 mg/kg | Freq: Once | INTRAVENOUS | Status: AC
  Administered 2023-08-30: 420 mg via INTRAVENOUS
  Filled 2023-08-30: qty 20

## 2023-08-30 MED ORDER — HEPARIN SOD (PORK) LOCK FLUSH 100 UNIT/ML IV SOLN
500.0000 [IU] | Freq: Once | INTRAVENOUS | Status: AC | PRN
Start: 2023-08-30 — End: 2023-08-30
  Administered 2023-08-30: 500 [IU]

## 2023-08-30 MED ORDER — SODIUM CHLORIDE 0.9% FLUSH
10.0000 mL | INTRAVENOUS | Status: DC | PRN
  Administered 2023-08-30: 10 mL

## 2023-08-30 MED ORDER — SODIUM CHLORIDE 0.9 % IV SOLN: Freq: Once | INTRAVENOUS | Status: AC

## 2023-08-30 MED ORDER — TRIAMTERENE-HCTZ 37.5-25 MG PO CAPS: 1.0000 | ORAL_CAPSULE | Freq: Every day | ORAL | 1 refills | Status: DC

## 2023-08-30 MED ORDER — DIPHENHYDRAMINE HCL 25 MG PO CAPS
50.0000 mg | ORAL_CAPSULE | Freq: Once | ORAL | Status: AC
Start: 2023-08-30 — End: 2023-08-30
  Administered 2023-08-30: 50 mg via ORAL
  Filled 2023-08-30: qty 2

## 2023-08-30 NOTE — Progress Notes (Signed)
Continue same dose Trastuzumab despite wt gain per Dr. Al Pimple.  Ebony Hail, Pharm.D., CPP 08/30/2023@12 :33 PM

## 2023-08-30 NOTE — Assessment & Plan Note (Signed)
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  She is currently on Herceptin/Anastrozole    Breast Cancer On Anastrozole (Arimidex) and Herceptin for breast cancer treatment. -Continue Anastrozole and Herceptin as prescribed. -Most recent echo normal, patient to f/u with Dr. Sofie Hartigan in heart clinic 09/14/2022.  Peripheral Neuropathy Difficulty walking in a straight line and wobbling due to neuropathy. Currently undergoing physical therapy. -Continue physical therapy.  Edema Noted significant swelling in ankles and weight gain. No fluid heard in lungs. No recent medication changes. Sodium intake to be evaluated. -Dyazide prescribed, 1 tab daily. Monitor weight and report any changes. -Check CBC, CMP, and d-dimer today in light of recent travel.   Weight Management Expressed concern about weight gain. Limited ability to exercise due to neuropathy and edema. -Encouraged to monitor dietary intake, specifically sodium. Provided with an eating test to identify potential dietary changes. -Continue efforts to increase physical activity as able.  Follow-up Scheduled for next appointment on 09/20/2023.

## 2023-08-30 NOTE — Progress Notes (Signed)
[facility] Cancer Follow up:    Medicine, [facility] [address] Vella Raring [place] [address]   DIAGNOSIS:  Cancer Staging  Malignant neoplasm of upper-outer quadrant of right breast in female, estrogen receptor positive (HCC) Staging form: Breast, AJCC 8th Edition - Pathologic stage from 10/05/2022: Stage IA (pT1c, pN0, cM0, G2, ER+, PR+, HER2+) - Signed by Loa Socks, NP on 11/08/2022 Stage prefix: Initial diagnosis Histologic grading system: 3 grade system   SUMMARY OF ONCOLOGIC HISTORY: Oncology History  Malignant neoplasm of upper-outer quadrant of right breast in female, estrogen receptor positive (HCC)  08/02/2022 Mammogram   In the right breast, possible distortion warrants further evaluation. In the left breast, no findings suspicious for malignancy. Diagnostic mammogram showed persistent subtle distortion central slightly LATERAL aspect of the RIGHT breast warranting tissue diagnosis.     09/01/2022 Pathology Results   Final pathology showed grade 2 invasive mammary carcinoma, prognostic showed ER 90% positive strong staining PR 90% positive strong staining, Ki-67 of 5% and HER2 3+ by [facility]   09/10/2022 Initial Diagnosis   Malignant neoplasm of upper-outer quadrant of right breast in female, estrogen receptor positive (HCC)    Genetic Testing   [facility] CancerNext-Expanded Panel+RNA was Positive. 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 Report date is 10/04/2022.  The CancerNext-Expanded gene panel offered by [facility] and includes sequencing, rearrangement, and RNA analysis for the following 77 genes: AIP, ALK, APC, ATM, AXIN2, BAP1, BARD1, BLM, BMPR1A, BRCA1, BRCA2, BRIP1, CDC73, CDH1, CDK4, CDKN1B, CDKN2A, CHEK2, CTNNA1, DICER1, FANCC, FH, FLCN, GALNT12, KIF1B, LZTR1, MAX, MEN1, MET, MLH1, MSH2, MSH3, MSH6,  MUTYH, NBN, NF1, NF2, NTHL1, PALB2, PHOX2B, PMS2, POT1, PRKAR1A, PTCH1, PTEN, RAD51C, RAD51D, RB1, RECQL, RET, SDHA, SDHAF2, SDHB, SDHC, SDHD, SMAD4, SMARCA4, SMARCB1, SMARCE1, STK11, SUFU, TMEM127, TP53, TSC1, TSC2, VHL and XRCC2 (sequencing and deletion/duplication); EGFR, EGLN1, HOXB13, KIT, MITF, PDGFRA, POLD1, and POLE (sequencing only); EPCAM and GREM1 (deletion/duplication only).    10/05/2022 Surgery   Right breast lumpectomy with Dr. 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  Grade 2, 2 sentinel lymph nodes negative for metastasis.  Margins negative.  T1c, N0.   10/05/2022 Cancer Staging   Staging form: Breast, AJCC 8th Edition - Pathologic stage from 10/05/2022: Stage IA (pT1c, pN0, cM0, G2, ER+, PR+, HER2+) - Signed by Loa Socks, NP on 11/08/2022 Stage prefix: Initial diagnosis Histologic grading system: 3 grade system   11/08/2022 -  Chemotherapy   Patient is on Treatment Plan : BREAST Paclitaxel + Trastuzumab q7d / Trastuzumab q21d     03/23/2023 - 04/19/2023 Radiation Therapy   Adjuvant radiation     CURRENT THERAPY: Anastrozole/Herceptin  INTERVAL HISTORY:  Discussed the use of AI scribe software for clinical note transcription with the patient, who gave verbal consent to proceed.  Judy Morrison 72 y.o. female is here for f/u prior to receiving herceptin therapy.  Recent echo on 12/6 showed LVEF of 55-60%. She has a known history of neuropathy, presents with difficulty walking in a straight line and a recent onset of significant ankle swelling. She describes her gait as 'wobbly' and has been attending physical therapy to address this issue. The swelling in her ankles is described as 'puffed up,' leaving imprints from her shoes. She also reports a recent weight gain, which she attributes to her inability to walk her usual path  due to her unsteady gait.  The patient is currently on anastrozole for breast cancer and has recently  completed a course of Keflex. She denies any recent changes in her medication regimen. She also reports a history of knee replacements, bunion removal, and rotator cuff surgery.  The patient has been experiencing shortness of breath, which she believes may be due to her recent weight gain. She expresses a desire to resume her walking routine but is hesitant due to her unsteady gait. She denies any chest pain or irregular heartbeat.  The patient also reports a recent cruise trip, during which she experienced increased swelling in her feet. She denies any significant dietary changes during the trip. She expresses concern about her weight gain and is seeking advice on how to manage it.   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   Malignant neoplasm of upper-outer quadrant of right breast in female, estrogen receptor positive (HCC) 09/10/2022   Rash and other nonspecific skin eruption 09/15/2020   Allergic contact dermatitis 09/15/2020   Pruritus 08/14/2020   Dizziness 05/10/2018   Atypical chest pain 05/10/2018   Primary osteoarthritis of left knee 01/06/2018   S/P knee replacement 08/12/2017   Incomplete tear of right rotator cuff 05/19/2017   Osteopenia 05/19/2017   Urge incontinence of urine 05/19/2017   Primary open angle glaucoma (POAG) 12/30/2016   Primary osteoarthritis of both knees 12/30/2016   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  MEDICAL HISTORY: Past Medical History:  Diagnosis Date   Anemia    Anxiety    Arthritis    Depressed    Dermatitis    rare form, on prednisone   GERD  (gastroesophageal reflux disease)    controlled with Omeprazole   Glaucoma    Torn rotator cuff    RIGHT    Urinary tract infection    dx 05-26-17 took 1 week cipro BID completed 06-02-17.    SURGICAL HISTORY: Past Surgical History:  Procedure Laterality Date   ADENOIDECTOMY     APPENDECTOMY     arthroscopic right knee      BREAST BIOPSY Right 09/01/2022   MM RT BREAST BX W LOC DEV 1ST LESION IMAGE BX SPEC STEREO GUIDE 09/01/2022 [facility]   BREAST BIOPSY  10/01/2022   MM RT RADIOACTIVE SEED LOC MAMMO GUIDE 10/01/2022 [facility]   BREAST LUMPECTOMY WITH RADIOACTIVE SEED AND SENTINEL LYMPH NODE BIOPSY Right 10/05/2022   Procedure: RIGHT BREAST LUMPECTOMY WITH RADIOACTIVE SEED AND AXILLARY SENTINEL LYMPH NODE BIOPSY;  Surgeon: [name], MD;  Location: [facility];  Service: General;  Laterality: Right;   CHOLECYSTECTOMY     COLONOSCOPY  06/06/2017   Pyrtle   Pelvic sling     x2   POLYPECTOMY     PORT A CATH REVISION N/A 11/22/2022   Procedure: PORT A CATH REVISION;  Surgeon: [name], MD;  Location: [facility] OR;  Service: General;  Laterality: N/A;   PORTACATH PLACEMENT Left 10/05/2022   Procedure: INSERTION PORT-A-CATH;  Surgeon: [name], MD;  Location: [facility];  Service: General;  Laterality: Left;   ROTATOR CUFF REPAIR  Right    TONSILLECTOMY     TOTAL KNEE ARTHROPLASTY Right 08/12/2017   Procedure: RIGHT TOTAL KNEE ARTHROPLASTY;  Surgeon: Eugenia Mcalpine, MD;  Location: [facility];  Service: Orthopedics;  Laterality: Right;   TOTAL KNEE ARTHROPLASTY Left 01/06/2018   Procedure: LEFT TOTAL KNEE ARTHROPLASTY;  Surgeon: Eugenia Mcalpine, MD;  Location: [facility];  Service: Orthopedics;  Laterality: Left;   UPPER GASTROINTESTINAL ENDOSCOPY     WISDOM TOOTH EXTRACTION      SOCIAL HISTORY: Social History   Socioeconomic History   Marital status: Single    Spouse name: Not on file   Number of children: Not on file    Years of education: Not on file   Highest education level: Not on file  Occupational History   Not on file  Tobacco Use   Smoking status: Former    Current packs/day: 0.00    Types: Cigarettes    Quit date: 53    Years since quitting: 38.9   Smokeless tobacco: Never  Vaping Use   Vaping status: Never Used  Substance and Sexual Activity   Alcohol use: Yes    Alcohol/week: 2.0 standard drinks of alcohol    Types: 2 Cans of beer per week   Drug use: No   Sexual activity: Not on file  Other Topics Concern   Not on file  Social History Narrative   Not on file   Social Drivers of Health   Financial Resource Strain: Low Risk  (04/11/2023)   Received from [facility]   Overall Financial Resource Strain (CARDIA)    Difficulty of Paying Living Expenses: Not hard at all  Food Insecurity: No Food Insecurity (04/11/2023)   Received from [facility]   Hunger Vital Sign    Worried About Running Out of Food in the Last Year: Never true    Ran Out of Food in the Last Year: Never true  Transportation Needs: No Transportation Needs (04/11/2023)   Received from [facility] - Transportation    Lack of Transportation (Medical): No    Lack of Transportation (Non-Medical): No  Physical Activity: Sufficiently Active (08/22/2022)   Received from [facility]   Exercise Vital Sign    Days of Exercise per Week: 3 days    Minutes of Exercise per Session: 50 min  Stress: No Stress Concern Present (07/28/2021)   Received from [facility]ational [facility] - Occupational Stress Questionnaire    Feeling of Stress : Not at all  Social Connections: Socially Integrated (08/22/2022)   Received from [facility]   Social Network    How would you rate your social network (family, work, friends)?: Good participation with social networks  Intimate Partner Violence: Not At Risk (08/22/2022)   Received from [facility]   HITS    Over the last 12 months how often did your partner physically hurt you?: Never    Over the last 12 months how often did your partner insult you or talk down to you?: Never    Over the last 12 months how often did your partner threaten you with physical harm?: Never    Over the last 12 months how often did your partner scream or curse at you?: Never    FAMILY HISTORY: Family History  Problem Relation Age of Onset   Bowel Disease Mother        ishemic bowel   Lung cancer Mother  smoked   Cervical cancer Paternal Aunt    Cancer Maternal Grandfather        unknown type   Lung cancer Paternal Grandfather        smoked   Colon cancer Neg Hx    Breast cancer Neg Hx    Esophageal cancer Neg Hx    Pancreatic cancer Neg Hx    Prostate cancer Neg Hx    Rectal cancer Neg Hx    Stomach cancer Neg Hx    Allergic rhinitis Neg Hx    Asthma Neg Hx    Eczema Neg Hx    Urticaria Neg Hx    Colon polyps Neg Hx     Review of Systems  Constitutional:  Positive for fatigue. Negative for appetite change, chills, fever and unexpected weight change.  HENT:   Negative for hearing loss, lump/mass and trouble swallowing.   Eyes:  Negative for eye problems and icterus.  Respiratory:  Negative for chest tightness, cough and shortness of breath.   Cardiovascular:  Positive for leg swelling. Negative for chest pain and palpitations.  Gastrointestinal:  Negative for abdominal distention, abdominal pain, constipation, diarrhea, nausea and vomiting.  Endocrine: Negative for hot flashes.  Genitourinary:  Negative for difficulty urinating.   Musculoskeletal:  Negative for arthralgias.  Skin:  Negative for itching and rash.  Neurological:  Negative for dizziness, extremity weakness, headaches and numbness.  Hematological:  Negative for adenopathy. Does not bruise/bleed easily.  Psychiatric/Behavioral:  Negative for depression. The patient is not nervous/anxious.        PHYSICAL EXAMINATION   Onc Performance Status - 08/30/23 1100       KPS SCALE   KPS % SCORE Able to carry on normal activity, minor s/s of disease             Vitals:   08/30/23 1103  BP: (!) 144/74  Pulse: 89  Resp: 17  Temp: 98.5 F (36.9 C)  SpO2: 96%    Physical Exam Constitutional:      General: She is not in acute distress.    Appearance: Normal appearance. She is not toxic-appearing.  HENT:     Head: Normocephalic and atraumatic.     Mouth/Throat:     Mouth: Mucous membranes are moist.     Pharynx: Oropharynx is clear. No oropharyngeal exudate or posterior oropharyngeal erythema.  Eyes:     General: No scleral icterus. Cardiovascular:     Rate and Rhythm: Normal rate and regular rhythm.     Pulses: Normal pulses.     Heart sounds: Normal heart sounds.  Pulmonary:     Effort: Pulmonary effort is normal.     Breath sounds: Normal breath sounds.  Abdominal:     General: Abdomen is flat. Bowel sounds are normal. There is no distension.     Palpations: Abdomen is soft.     Tenderness: There is no abdominal tenderness.  Musculoskeletal:        General: Swelling present.     Cervical back: Neck supple.     Right lower leg: 1+ Pitting Edema present.     Left lower leg: 1+ Pitting Edema present.  Lymphadenopathy:     Cervical: No cervical adenopathy.  Skin:    General: Skin is warm and dry.     Findings: No rash.  Neurological:     General: No focal deficit present.     Mental Status: She is alert.  Psychiatric:        Mood and  Affect: Mood normal.        Behavior: Behavior normal.     LABORATORY DATA:  CBC    Component Value Date/Time   WBC 6.4 03/28/2023 0948   WBC 10.1 10/01/2021 1314   RBC 3.83 (L) 03/28/2023 0948   HGB 10.1 (L) 03/28/2023 0948   HGB 13.7 09/15/2020 1209   HCT 30.9 (L) 03/28/2023 0948   HCT 41.3 09/15/2020 1209   PLT 325 03/28/2023 0948   PLT 349 09/15/2020 1209   MCV 80.7 03/28/2023 0948   MCV 86 09/15/2020 1209    MCH 26.4 03/28/2023 0948   MCHC 32.7 03/28/2023 0948   RDW 16.2 (H) 03/28/2023 0948   RDW 12.3 09/15/2020 1209   LYMPHSABS 1.2 03/28/2023 0948   LYMPHSABS 1.2 09/15/2020 1209   MONOABS 0.8 03/28/2023 0948   EOSABS 0.3 03/28/2023 0948   EOSABS 0.2 09/15/2020 1209   BASOSABS 0.1 03/28/2023 0948   BASOSABS 0.1 09/15/2020 1209    CMP     Component Value Date/Time   NA 138 03/28/2023 0948   NA 135 09/15/2020 1209   K 3.9 03/28/2023 0948   CL 104 03/28/2023 0948   CO2 28 03/28/2023 0948   GLUCOSE 83 03/28/2023 0948   BUN 14 03/28/2023 0948   BUN 14 09/15/2020 1209   CREATININE 0.85 03/28/2023 0948   CALCIUM 9.2 03/28/2023 0948   PROT 6.6 03/28/2023 0948   PROT 6.8 09/15/2020 1209   ALBUMIN 4.0 03/28/2023 0948   ALBUMIN 4.6 09/15/2020 1209   AST 16 03/28/2023 0948   ALT 15 03/28/2023 0948   ALKPHOS 94 03/28/2023 0948   BILITOT 0.4 03/28/2023 0948   GFRNONAA >60 03/28/2023 0948   GFRAA 74 09/15/2020 1209      ASSESSMENT and THERAPY PLAN:   Malignant neoplasm of upper-outer quadrant of right breast in female, estrogen receptor positive (HCC) Judy Morrison is a 72 year old woman with history of stage Ia triple positive breast cancer diagnosed in January 2024 status postlumpectomy and here today to begin adjuvant chemotherapy and immunotherapy with paclitaxel and trastuzumab.  She is currently on Herceptin/Anastrozole    Breast Cancer On Anastrozole (Arimidex) and Herceptin for breast cancer treatment. -Continue Anastrozole and Herceptin as prescribed. -Most recent echo normal, patient to f/u with Dr. Sofie Hartigan in heart clinic 09/14/2022.  Peripheral Neuropathy Difficulty walking in a straight line and wobbling due to neuropathy. Currently undergoing physical therapy. -Continue physical therapy.  Edema Noted significant swelling in ankles and weight gain. No fluid heard in lungs. No recent medication changes. Sodium intake to be evaluated. -Dyazide prescribed, 1 tab daily.  Monitor weight and report any changes. -Check CBC, CMP, and d-dimer today in light of recent travel.   Weight Management Expressed concern about weight gain. Limited ability to exercise due to neuropathy and edema. -Encouraged to monitor dietary intake, specifically sodium. Provided with an eating test to identify potential dietary changes. -Continue efforts to increase physical activity as able.  Follow-up Scheduled for next appointment on 09/20/2023.   ll questions were answered. The patient knows to call the clinic with any problems, questions or concerns. We can certainly see the patient much sooner if necessary.  Total encounter time:40 minutes*in face-to-face visit time, chart review, lab review, care coordination, order entry, and documentation of the encounter time.    Lillard Anes, NP 08/30/23 12:12 PM Medical Oncology and Hematology [facility] [address] [place], [place] [address] Tel. [phone]    Fax. [phone]  *Total Encounter Time as defined  by the Centers for Medicare and Medicaid Services includes, in addition to the face-to-face time of a patient visit (documented in the note above) non-face-to-face time: obtaining and reviewing outside history, ordering and reviewing medications, tests or procedures, care coordination (communications with other health care professionals or caregivers) and documentation in the medical record.

## 2023-08-30 NOTE — Patient Instructions (Addendum)
Low-Sodium Eating Plan Salt (sodium) helps you keep a healthy balance of fluids in your body. Too much sodium can raise your blood pressure. It can also cause fluid and waste to be held in your body. Your health care provider or dietitian may recommend a low-sodium eating plan if you have high blood pressure (hypertension), kidney disease, liver disease, or heart failure. Eating less sodium can help lower your blood pressure and reduce swelling. It can also protect your heart, liver, and kidneys. What are tips for following this plan? Reading food labels  Check food labels for the amount of sodium per serving. If you eat more than one serving, you must multiply the listed amount by the number of servings. Choose foods with less than 140 milligrams (mg) of sodium per serving. Avoid foods with 300 mg of sodium or more per serving. Always check how much sodium is in a product, even if the label says "unsalted" or "no salt added." Shopping  Buy products labeled as "low-sodium" or "no salt added." Buy fresh foods. Avoid canned foods and pre-made or frozen meals. Avoid canned, cured, or processed meats. Buy breads that have less than 80 mg of sodium per slice. Cooking  Eat more home-cooked food. Try to eat less restaurant, buffet, and fast food. Try not to add salt when you cook. Use salt-free seasonings or herbs instead of table salt or sea salt. Check with your provider or pharmacist before using salt substitutes. Cook with plant-based oils, such as canola, sunflower, or olive oil. Meal planning When eating at a restaurant, ask if your food can be made with less salt or no salt. Avoid dishes labeled as brined, pickled, cured, or smoked. Avoid dishes made with soy sauce, miso, or teriyaki sauce. Avoid foods that have monosodium glutamate (MSG) in them. MSG may be added to some restaurant food, sauces, soups, bouillon, and canned foods. Make meals that can be grilled, baked, poached, roasted, or  steamed. These are often made with less sodium. General information Try to limit your sodium intake to 1,500-2,300 mg each day, or the amount told by your provider. What foods should I eat? Fruits Fresh, frozen, or canned fruit. Fruit juice. Vegetables Fresh or frozen vegetables. "No salt added" canned vegetables. "No salt added" tomato sauce and paste. Low-sodium or reduced-sodium tomato and vegetable juice. Grains Low-sodium cereals, such as oats, puffed wheat and rice, and shredded wheat. Low-sodium crackers. Unsalted rice. Unsalted pasta. Low-sodium bread. Whole grain breads and whole grain pasta. Meats and other proteins Fresh or frozen meat, poultry, seafood, and fish. These should have no added salt. Low-sodium canned tuna and salmon. Unsalted nuts. Dried peas, beans, and lentils without added salt. Unsalted canned beans. Eggs. Unsalted nut butters. Dairy Milk. Soy milk. Cheese that is naturally low in sodium, such as ricotta cheese, fresh mozzarella, or Swiss cheese. Low-sodium or reduced-sodium cheese. Cream cheese. Yogurt. Seasonings and condiments Fresh and dried herbs and spices. Salt-free seasonings. Low-sodium mustard and ketchup. Sodium-free salad dressing. Sodium-free light mayonnaise. Fresh or refrigerated horseradish. Lemon juice. Vinegar. Other foods Homemade, reduced-sodium, or low-sodium soups. Unsalted popcorn and pretzels. Low-salt or salt-free chips. The items listed above may not be all the foods and drinks you can have. Talk to a dietitian to learn more. What foods should I avoid? Vegetables Sauerkraut, pickled vegetables, and relishes. Olives. [place] fries. Onion rings. Regular canned vegetables, except low-sodium or reduced-sodium items. Regular canned tomato sauce and paste. Regular tomato and vegetable juice. Frozen vegetables in sauces. Grains Instant  hot cereals. Bread stuffing, pancake, and biscuit mixes. Croutons. Seasoned rice or pasta mixes. Noodle soup  cups. Boxed or frozen macaroni and cheese. Regular salted crackers. Self-rising flour. Meats and other proteins Meat or fish that is salted, canned, smoked, spiced, or pickled. Precooked or cured meat, such as sausages or meat loaves. Tomasa Blase. Ham. Pepperoni. Hot dogs. Corned beef. Chipped beef. Salt pork. Jerky. Pickled herring, anchovies, and sardines. Regular canned tuna. Salted nuts. Dairy Processed cheese and cheese spreads. Hard cheeses. Cheese curds. Blue cheese. Feta cheese. String cheese. Regular cottage cheese. Buttermilk. Canned milk. Fats and oils Salted butter. Regular margarine. Ghee. Bacon fat. Seasonings and condiments Onion salt, garlic salt, seasoned salt, table salt, and sea salt. Canned and packaged gravies. Worcestershire sauce. Tartar sauce. Barbecue sauce. Teriyaki sauce. Soy sauce, including reduced-sodium soy sauce. Steak sauce. Fish sauce. Oyster sauce. Cocktail sauce. Horseradish that you find on the shelf. Regular ketchup and mustard. Meat flavorings and tenderizers. Bouillon cubes. Hot sauce. Pre-made or packaged marinades. Pre-made or packaged taco seasonings. Relishes. Regular salad dressings. Salsa. Other foods Salted popcorn and pretzels. Corn chips and puffs. Potato and tortilla chips. Canned or dried soups. Pizza. Frozen entrees and pot pies. The items listed above may not be all the foods and drinks you should avoid. Talk to a dietitian to learn more. This information is not intended to replace advice given to you by your health care provider. Make sure you discuss any questions you have with your health care provider. Document Revised: 09/16/2022 Document Reviewed: 09/16/2022 Elsevier Patient Education  2024 Else[employer]. Triamterene; Hydrochlorothiazide Capsules or Tablets What is this medication? TRIAMTERENE; HYDROCHLOROTHIAZIDE (trye AM ter een; hye droe klor oh THYE a zide) treats high blood pressure. It helps your kidneys remove more fluid and salt from your  blood through the urine. It is a combination of two diuretics. This medicine may be used for other purposes; ask your health care provider or pharmacist if you have questions. COMMON BRAND NAME(S): Dyazide, Maxzide, Maxzide-25 What should I tell my care team before I take this medication? They need to know if you have any of these conditions: High blood sugar (diabetes) Immune system problems, like lupus Kidney disease or stones Liver disease Small amount of urine or difficulty passing urine An unusual or allergic reaction to triamterene, hydrochlorothiazide, other medications, foods, dyes, or preservatives Pregnant or trying to get pregnant Breast-feeding How should I use this medication? Take this medication by mouth. Take it as directed on the prescription label at the same time every day. You can take it with or without food. If it upsets your stomach, take it with food. Keep taking it unless your care team tells you to stop. Talk to your care team about the use of this medication in children. Special care may be needed. Overdosage: If you think you have taken too much of this medicine contact a poison control center or emergency room at once. NOTE: This medicine is only for you. Do not share this medicine with others. What if I miss a dose? If you miss a dose, take it as soon as you can. If it is almost time for your next dose, take only that dose. Do not take double or extra doses. What may interact with this medication? Do not take this medication with any of the following: Cidofovir Dofetilide Eplerenone Potassium supplements Tranylcypromine This medication may also interact with the following: Certain medications for blood pressure, heart disease like benazepril, lisinopril, losartan, valsartan Lithium Medications for  diabetes Medications that relax muscles for surgery NSAIDs, medications for pain and inflammation, like ibuprofen or naproxen Other diuretics Penicillin G  potassium This list may not describe all possible interactions. Give your health care provider a list of all the medicines, herbs, non-prescription drugs, or dietary supplements you use. Also tell them if you smoke, drink alcohol, or use illegal drugs. Some items may interact with your medicine. What should I watch for while using this medication? Visit your care team for regular checks on your progress. You will need lab work done before you start this medication and regularly while you are taking it. Check your blood pressure as directed. Know what your blood pressure should be and when to contact your care team. This medication may increase blood sugar. Ask your care team if changes in diet or medications are needed if you have diabetes. You may need to be on a special diet while taking this medication. Ask your care team. Also, ask how many glasses of fluid you need to drink a day. You must not get dehydrated. This medication may affect your coordination, reaction time, or judgment. Do not drive or operate machinery until you know how this medication affects you. Sit up or stand slowly to reduce the risk of dizzy or fainting spells. Drinking alcohol with this medication can increase the risk of these side effects. Talk to your care team about your risk of skin cancer. You may be more at risk for skin cancer if you take this medication. This medication can make you more sensitive to the sun. Keep out of the sun. If you cannot avoid being in the sun, wear protective clothing and use sunscreen. Do not use sun lamps or tanning beds/booths. What side effects may I notice from receiving this medication? Side effects that you should report to your care team as soon as possible: Allergic reactions--skin rash, itching, hives, swelling of the face, lips, tongue, or throat Dehydration--increased thirst, dry mouth, feeling faint or lightheaded, headache, dark yellow or brown urine Gout--severe pain, redness,  warmth, or swelling in joints such as the big toe High potassium level--muscle weakness, fast or irregular heartbeat Kidney injury--decrease in the amount of urine, swelling of the ankles, hands, or feet Kidney stones--blood in the urine, pain or trouble passing urine, pain in the lower back or sides Low blood pressure--dizziness, feeling faint or lightheaded, blurry vision Sudden eye pain or change in vision, such as blurry vision, seeing halos around lights, vision loss Side effects that usually do not require medical attention (report to your care team if they continue or are bothersome): Change in sex drive or performance Headache Upset stomach This list may not describe all possible side effects. Call your doctor for medical advice about side effects. You may report side effects to FDA at 1-800-FDA-1088. Where should I keep my medication? Keep out of the reach of children and pets. Store at room temperature between 20 and 25 degrees C (68 and 77 degrees F). Protect from light. Throw away any unused medication after the expiration date. NOTE: This sheet is a summary. It may not cover all possible information. If you have questions about this medicine, talk to your doctor, pharmacist, or health care provider.  2024 Elsevier/Gold Standard (2021-05-15 00:00:00)

## 2023-08-30 NOTE — Patient Instructions (Signed)
[facility] - A DEPT OF [facility]  Discharge Instructions: Thank you for choosing [facility] to provide your oncology and hematology care.   If you have a lab appointment with the Cancer Center, please go directly to the Cancer Center and check in at the registration area.   Wear comfortable clothing and clothing appropriate for easy access to any Portacath or PICC line.   We strive to give you quality time with your provider. You may need to reschedule your appointment if you arrive late (15 or more minutes).  Arriving late affects you and other patients whose appointments are after yours.  Also, if you miss three or more appointments without notifying the office, you may be dismissed from the clinic at the provider's discretion.      For prescription refill requests, have your pharmacy contact our office and allow 72 hours for refills to be completed.    Today you received the following chemotherapy and/or immunotherapy agents Ontruzant      To help prevent nausea and vomiting after your treatment, we encourage you to take your nausea medication as directed.  BELOW ARE SYMPTOMS THAT SHOULD BE REPORTED IMMEDIATELY: *FEVER GREATER THAN 100.4 F (38 C) OR HIGHER *CHILLS OR SWEATING *NAUSEA AND VOMITING THAT IS NOT CONTROLLED WITH YOUR NAUSEA MEDICATION *UNUSUAL SHORTNESS OF BREATH *UNUSUAL BRUISING OR BLEEDING *URINARY PROBLEMS (pain or burning when urinating, or frequent urination) *BOWEL PROBLEMS (unusual diarrhea, constipation, pain near the anus) TENDERNESS IN MOUTH AND THROAT WITH OR WITHOUT PRESENCE OF ULCERS (sore throat, sores in mouth, or a toothache) UNUSUAL RASH, SWELLING OR PAIN  UNUSUAL VAGINAL DISCHARGE OR ITCHING   Items with * indicate a potential emergency and should be followed up as soon as possible or go to the Emergency Department if any problems should occur.  Please show the CHEMOTHERAPY ALERT CARD or IMMUNOTHERAPY  ALERT CARD at check-in to the Emergency Department and triage nurse.  Should you have questions after your visit or need to cancel or reschedule your appointment, please contact [facility] - [facility]  Dept: [phone]  and follow the prompts.  Office hours are 8:00 a.m. to 4:30 p.m. Monday - Friday. Please note that voicemails left after 4:00 p.m. may not be returned until the following business day.  We are closed weekends and major holidays. You have access to a nurse at all times for urgent questions. Please call the main number to the clinic Dept: [phone] and follow the prompts.   For any non-urgent questions, you may also contact your provider using MyChart. We now offer e-Visits for anyone 35 and older to request care online for non-urgent symptoms. For details visit [email or web address].   Also download the MyChart app! Go to the app store, search "MyChart", open the app, select [place], and log in with your MyChart username and password.

## 2023-08-31 ENCOUNTER — Ambulatory Visit
Admission: RE | Admit: 2023-08-31 | Discharge: 2023-08-31 | Disposition: A | Discharge status: Home / Self Care | Payer: Medicare Other | Source: Ambulatory Visit | Attending: Hematology and Oncology | Admitting: Hematology and Oncology

## 2023-08-31 ENCOUNTER — Telehealth: Payer: Self-pay

## 2023-08-31 ENCOUNTER — Other Ambulatory Visit: Payer: Self-pay | Admitting: Adult Health

## 2023-08-31 ENCOUNTER — Other Ambulatory Visit: Payer: Self-pay

## 2023-08-31 DIAGNOSIS — M7989 Other specified soft tissue disorders: Secondary | ICD-10-CM

## 2023-08-31 DIAGNOSIS — C50411 Malignant neoplasm of upper-outer quadrant of right female breast: Secondary | ICD-10-CM

## 2023-08-31 DIAGNOSIS — R7989 Other specified abnormal findings of blood chemistry: Secondary | ICD-10-CM

## 2023-08-31 HISTORY — DX: Personal history of irradiation: Z92.3

## 2023-08-31 HISTORY — DX: Malignant neoplasm of unspecified site of unspecified female breast: C50.919

## 2023-08-31 HISTORY — DX: Personal history of antineoplastic chemotherapy: Z92.21

## 2023-08-31 NOTE — Telephone Encounter (Addendum)
Called pt per message below. To inform her that an order was placed and her appt. Was made stat at the [facility]. For her to arrive 15 minutes before her appt. Time. Pt. Verbalized understanding. ----- Message from Noreene Filbert sent at 08/31/2023 10:34 AM EST ----- With her leg swelling and elevated d dimer in light of recent travel recommend bilateral lower extremity doppler to r/o blood clot, ASAP ----- Message ----- From: Leory Plowman, Lab In [place] Sent: 08/30/2023   5:06 PM EST To: Loa Socks, NP

## 2023-09-01 ENCOUNTER — Telehealth: Payer: Self-pay

## 2023-09-01 ENCOUNTER — Ambulatory Visit (HOSPITAL_COMMUNITY)
Admission: RE | Admit: 2023-09-01 | Discharge: 2023-09-01 | Disposition: A | Discharge status: Home / Self Care | Payer: Medicare Other | Source: Ambulatory Visit | Attending: Adult Health | Admitting: Adult Health

## 2023-09-01 DIAGNOSIS — R7989 Other specified abnormal findings of blood chemistry: Secondary | ICD-10-CM | POA: Insufficient documentation

## 2023-09-01 DIAGNOSIS — M7989 Other specified soft tissue disorders: Secondary | ICD-10-CM | POA: Diagnosis not present

## 2023-09-01 DIAGNOSIS — C50411 Malignant neoplasm of upper-outer quadrant of right female breast: Secondary | ICD-10-CM | POA: Diagnosis present

## 2023-09-01 DIAGNOSIS — Z170 Estrogen receptor positive status [ER+]: Secondary | ICD-10-CM | POA: Insufficient documentation

## 2023-09-01 NOTE — Telephone Encounter (Signed)
Call from vascular lab stating that she is negative for Dvt in bilateral lower veinous extremity.

## 2023-09-01 NOTE — Progress Notes (Signed)
Bilateral lower extremity venous duplex has been completed. Preliminary results can be found in CV Proc through chart review.  Results were given to Deritra at [facility] office.  09/01/23 10:01 AM Olen Cordial RVT

## 2023-09-01 NOTE — Telephone Encounter (Signed)
Called pt per Np to inform her about continuing her appt. With PT. Patient verbalized understanding and stated that the vascular tech informed her that he didn't see anything meaning she was negative for DVT.

## 2023-09-09 ENCOUNTER — Encounter: Payer: Self-pay | Admitting: *Deleted

## 2023-09-12 ENCOUNTER — Ambulatory Visit: Payer: Medicare Other

## 2023-09-12 ENCOUNTER — Ambulatory Visit: Payer: Medicare Other | Admitting: Hematology and Oncology

## 2023-09-14 ENCOUNTER — Other Ambulatory Visit: Payer: Self-pay

## 2023-09-14 NOTE — Progress Notes (Signed)
 ADVANCED HEART FAILURE CLINIC NOTE  Referring Physician: Medicine, [facility]*  Primary Care: Medicine, [facility] Family Oncologist: Dr. Loretha  HPI: Judy Morrison is a 73 y.o. female with breast cancer presenting today to establish care.  Her cancer history dates back to November 2023 when screening mammogram demonstrated right breast irregularity.  The following months she had a pathology confirmed grade 2 invasive mammary carcinoma.  She underwent right breast lumpectomy by Dr. 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  She was started on chemotherapy in November 08, 2022 with paclitaxel , trastuzumab .  Interval hx:  - No shortness of breath, chest pain, lower extremity edema.  - Planning to undergo herceptin  based chemotherapy until December 06 2023 - Her biggest issues are peripheral neuropathy which appears to feel worse when she exerts herself. She does become fatigued with moderate to excessive exertion; she is currently doing PT 2x weekly and planning to start seeing a personal trainer to help lose weight or stay active.    Past Medical History:  Diagnosis Date   Anemia    Anxiety    Arthritis    Breast cancer (HCC)    Depressed    Dermatitis    rare form, on prednisone    GERD (gastroesophageal reflux disease)    controlled with Omeprazole    Glaucoma    Personal history of chemotherapy    Personal history of radiation therapy    Torn rotator cuff    RIGHT    Urinary tract infection    dx 05-26-17 took 1 week cipro  BID completed 06-02-17.    Current Outpatient Medications  Medication Sig Dispense Refill   Adapalene 0.3 % gel Apply 1 application daily as needed topically (for acne).     ALPRAZolam (XANAX) 0.25 MG tablet Take 0.25 mg by mouth daily as needed for anxiety.   1   anastrozole  (ARIMIDEX ) 1 MG tablet Take 1 tablet (1 mg total) by mouth daily. 90 tablet  3   brexpiprazole  (REXULTI ) 2 MG TABS tablet Take 2 mg by mouth daily after breakfast.     clobetasol cream (TEMOVATE) 0.05 % Apply 1 Application topically 2 (two) times daily as needed (irritation).     clonazePAM  (KLONOPIN ) 0.5 MG tablet Take 0.5 mg by mouth 2 (two) times daily.     clotrimazole (MYCELEX) 10 MG troche Take 10 mg by mouth as directed. 2 times per week     DULoxetine  (CYMBALTA ) 20 MG capsule Take 20 mg by mouth daily.     furosemide  (LASIX ) 20 MG tablet Take 1 tablet (20 mg total) by mouth as needed. 90 tablet 3   hydrOXYzine (VISTARIL) 25 MG capsule Take 25-50 mg by mouth daily.     latanoprost  (XALATAN ) 0.005 % ophthalmic solution Place 1 drop into both eyes at bedtime.     levothyroxine  (SYNTHROID , LEVOTHROID) 50 MCG tablet Take 50 mcg at bedtime by mouth.      lidocaine -prilocaine  (EMLA ) cream Apply 1 Application to affected area topically once daily. 30 g 3   loratadine  (CLARITIN ) 10 MG tablet Take 10 mg by mouth daily.     nortriptyline  (PAMELOR ) 50 MG capsule Take 100 mg at bedtime by mouth.      omeprazole  (PRILOSEC) 40 MG capsule Take 40 mg by mouth daily before breakfast.     OVER THE COUNTER MEDICATION 20 mLs by Mouth Rinse route 4 (four) times daily as needed (mouth pain). Oral-B  Sore Mouth Rinse     phenazopyridine (PYRIDIUM) 200 MG tablet Take 200 mg by mouth 3 (three) times daily as needed.     simvastatin (ZOCOR) 10 MG tablet Take 10 mg by mouth daily.     tacrolimus (PROGRAF) 1 MG capsule Take 1 mg by mouth 2 (two) times daily. Per patient - Dissolve capsule in one half liter of H2O. Swish and spit twice daily     triamcinolone cream (KENALOG) 0.1 % Apply 1 Application topically 2 (two) times daily as needed (irritation).     triamterene -hydrochlorothiazide (DYAZIDE) 37.5-25 MG capsule Take 1 each (1 capsule total) by mouth daily. 30 capsule 1   valACYclovir (VALTREX) 1000 MG tablet Take 1,000 mg by mouth daily as needed (outbreaks).     cephALEXin (KEFLEX) 500  MG capsule Take 500 mg by mouth 2 (two) times daily.     No current facility-administered medications for this encounter.   Facility-Administered Medications Ordered in Other Encounters  Medication Dose Route Frequency Provider Last Rate Last Admin   sodium chloride  flush (NS) 0.9 % injection 10 mL  10 mL Intracatheter PRN Iruku, Praveena, MD        Allergies  Allergen Reactions   Penicillin G Anaphylaxis   Cephalexin Other (See Comments)    Pt does not remember reaction    Codeine Nausea Only   Erythromycin Itching      Social History   Socioeconomic History   Marital status: Single    Spouse name: Not on file   Number of children: Not on file   Years of education: Not on file   Highest education level: Not on file  Occupational History   Not on file  Tobacco Use   Smoking status: Former    Current packs/day: 0.00    Types: Cigarettes    Quit date: 64    Years since quitting: 39.0   Smokeless tobacco: Never  Vaping Use   Vaping status: Never Used  Substance and Sexual Activity   Alcohol use: Yes    Alcohol/week: 2.0 standard drinks of alcohol    Types: 2 Cans of beer per week   Drug use: No   Sexual activity: Not on file  Other Topics Concern   Not on file  Social History Narrative   Not on file   Social Drivers of Health   Financial Resource Strain: Low Risk  (04/11/2023)   Received from [facility]   Overall Financial Resource Strain (CARDIA)    Difficulty of Paying Living Expenses: Not hard at all  Food Insecurity: No Food Insecurity (04/11/2023)   Received from [facility]   Hunger Vital Sign    Worried About Running Out of Food in the Last Year: Never true    Ran Out of Food in the Last Year: Never true  Transportation Needs: No Transportation Needs (04/11/2023)   Received from [facility] - Transportation    Lack of Transportation (Medical): No    Lack of Transportation (Non-Medical): No  Physical Activity: Sufficiently Active  (08/22/2022)   Received from [facility]   Exercise Vital Sign    Days of Exercise per Week: 3 days    Minutes of Exercise per Session: 50 min  Stress: No Stress Concern Present (07/28/2021)   Received from [facility], [facility] - Occupational Stress Questionnaire    Feeling of Stress : Not at all  Social Connections: Socially Integrated (08/22/2022)  Received from [facility], [facility]   Social Network    How would you rate your social network (family, work, friends)?: Good participation with social networks  Intimate Partner Violence: Not At Risk (08/22/2022)   Received from [facility], [facility]   HITS    Over the last 12 months how often did your partner physically hurt you?: Never    Over the last 12 months how often did your partner insult you or talk down to you?: Never    Over the last 12 months how often did your partner threaten you with physical harm?: Never    Over the last 12 months how often did your partner scream or curse at you?: Never      Family History  Problem Relation Age of Onset   Bowel Disease Mother        ishemic bowel   Lung cancer Mother        smoked   Cervical cancer Paternal Aunt    Cancer Maternal Grandfather        unknown type   Lung cancer Paternal Grandfather        smoked   Colon cancer Neg Hx    Breast cancer Neg Hx    Esophageal cancer Neg Hx    Pancreatic cancer Neg Hx    Prostate cancer Neg Hx    Rectal cancer Neg Hx    Stomach cancer Neg Hx    Allergic rhinitis Neg Hx    Asthma Neg Hx    Eczema Neg Hx    Urticaria Neg Hx    Colon polyps Neg Hx     PHYSICAL EXAM: Vitals:   09/15/23 1009  BP: 118/82  Pulse: (!) 103  SpO2: 97%   GENERAL: Well nourished, well developed, and in no apparent distress at rest.  HEENT: There is no scleral icterus.  The mucous membranes are pink and moist.   CHEST: There are no chest wall deformities. There is no  chest wall tenderness. Respirations are unlabored.  Lungs- CTA B/L CARDIAC:  JVP: 7 cm          Normal rate with regular rhythm. No murmur.  Pulses are 2+ and symmetrical in upper and lower extremities. No edema.  ABDOMEN: Soft, non-tender, non-distended. There are normal bowel sounds.  EXTREMITIES: Warm and well perfused.  NEUROLOGIC: Patient is oriented x3 with no obvious focal neurologic deficits.  PSYCH: Patients affect is appropriate SKIN: Warm and dry; no lesions or wounds.     DATA REVIEW  ECG: 10/02/21: NSR, LVH  As per my personal interpretation  ECHO: 08/19/23: LVEF 55%-60%, grade I DD 02/14/23: LVEF 50-55, GLS -13%, normal RV function as per my personal interpretation 10/18/22: LVEF 55%, GLS -15% normal RV function, mild AI 04/05/23: LVEF 65%, normal RV function, mild AI, GLS -19.9%.    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  GLS has suboptimal tracing and is mostly unchanged.   -Repeat echocardiogram on 08/19/2023 with LVEF of 60% and grade 1 diastolic dysfunction. -She is now taking lasix  20mg  daily -Start coreg  3.125mg  BID -BMP/BNP/hstrop.    2.  Invasive ductal carcinoma -Following with Dr. Loretha -Completed initial chemo regimen; now on q21d treatment with Herceptin  with plan to continue for 1 year. Will monitor with serial  q34m echocardiograms.  -Last seen by Dr. Lauran on August 01, 2023; at that timeku plan was to continue anastrozole  and Herceptin .  Herceptin  will be completed on November 22, 2023.  We will plan on a repeat follow-up in 3 months time with echocardiogram at the completion of her chemotherapy.   Reeve Mallo Advanced Heart Failure Mechanical Circulatory Support

## 2023-09-15 ENCOUNTER — Ambulatory Visit (HOSPITAL_COMMUNITY)
Admission: RE | Admit: 2023-09-15 | Discharge: 2023-09-15 | Disposition: A | Discharge status: Home / Self Care | Payer: Medicare Other | Source: Ambulatory Visit | Attending: Cardiology | Admitting: Cardiology

## 2023-09-15 ENCOUNTER — Other Ambulatory Visit (HOSPITAL_COMMUNITY): Payer: Self-pay

## 2023-09-15 VITALS — BP 118/82 | HR 103 | Wt 173.0 lb

## 2023-09-15 DIAGNOSIS — I5089 Other heart failure: Secondary | ICD-10-CM | POA: Diagnosis present

## 2023-09-15 DIAGNOSIS — Z170 Estrogen receptor positive status [ER+]: Secondary | ICD-10-CM

## 2023-09-15 DIAGNOSIS — C50411 Malignant neoplasm of upper-outer quadrant of right female breast: Secondary | ICD-10-CM | POA: Diagnosis not present

## 2023-09-15 DIAGNOSIS — Z9221 Personal history of antineoplastic chemotherapy: Secondary | ICD-10-CM | POA: Diagnosis not present

## 2023-09-15 LAB — BASIC METABOLIC PANEL
Anion gap: 11 (ref 5–15)
BUN: 12 mg/dL (ref 8–23)
CO2: 23 mmol/L (ref 22–32)
Calcium: 9.3 mg/dL (ref 8.9–10.3)
Chloride: 99 mmol/L (ref 98–111)
Creatinine, Ser: 0.86 mg/dL (ref 0.44–1.00)
GFR, Estimated: >60 mL/min (ref >60)
Glucose, Bld: 89 mg/dL (ref 70–99)
Potassium: 3.6 mmol/L (ref 3.5–5.1)
Sodium: 133 mmol/L — ABNORMAL LOW (ref 135–145)

## 2023-09-15 LAB — TROPONIN I (HIGH SENSITIVITY): Troponin I (High Sensitivity): 4 ng/L (ref <18)

## 2023-09-15 LAB — BRAIN NATRIURETIC PEPTIDE: B Natriuretic Peptide: 46.3 pg/mL (ref 0.0–100.0)

## 2023-09-15 MED ORDER — CARVEDILOL 3.125 MG PO TABS: 3.1250 mg | ORAL_TABLET | Freq: Two times a day (BID) | ORAL | 3 refills | Status: DC

## 2023-09-15 NOTE — Patient Instructions (Signed)
 Medication Changes:  START: CARVEDILOL  3.125MG  TWICE DAILY   Lab Work:  Labs done today, your results will be available in MyChart, we will contact you for abnormal readings.  Follow-Up in: 3 months as scheduled with Dr. Gardenia  At the [facility] Failure Clinic, you and your health needs are our priority. We have a designated team specialized in the treatment of Heart Failure. This Care Team includes your primary Heart Failure Specialized Cardiologist (physician), Advanced Practice Providers (APPs- Physician Assistants and Nurse Practitioners), and Pharmacist who all work together to provide you with the care you need, when you need it.   You may see any of the following providers on your designated Care Team at your next follow up:  Dr. Toribio Fuel Dr. Ezra Shuck Dr. Ria Gardenia Dr. Odis Brownie Greig Mosses, NP Caffie Shed, [place] [facility] [place], [place] Beckey Coe, NP Jordan Lee, NP Tinnie Redman, PharmD   Please be sure to bring in all your medications bottles to every appointment.   Need to Contact Us :  If you have any questions or concerns before your next appointment please send us  a message through [facility] or call our office at [phone].    TO LEAVE A MESSAGE FOR THE NURSE SELECT OPTION 2, PLEASE LEAVE A MESSAGE INCLUDING: YOUR NAME DATE OF BIRTH CALL BACK NUMBER REASON FOR CALL**this is important as we prioritize the call backs  YOU WILL RECEIVE A CALL BACK THE SAME DAY AS LONG AS YOU CALL BEFORE 4:00 PM

## 2023-09-16 ENCOUNTER — Other Ambulatory Visit: Payer: Self-pay

## 2023-09-19 ENCOUNTER — Encounter: Payer: Self-pay | Admitting: *Deleted

## 2023-09-20 ENCOUNTER — Inpatient Hospital Stay: Payer: Medicare Other

## 2023-09-20 ENCOUNTER — Inpatient Hospital Stay: Payer: Medicare Other | Attending: Hematology and Oncology | Admitting: Hematology and Oncology

## 2023-09-20 VITALS — BP 108/56 | HR 100 | Temp 98.2°F | Resp 18 | Wt 174.6 lb

## 2023-09-20 VITALS — BP 132/76 | HR 76 | Resp 16

## 2023-09-20 DIAGNOSIS — Z5112 Encounter for antineoplastic immunotherapy: Secondary | ICD-10-CM | POA: Diagnosis present

## 2023-09-20 DIAGNOSIS — Z170 Estrogen receptor positive status [ER+]: Secondary | ICD-10-CM | POA: Insufficient documentation

## 2023-09-20 DIAGNOSIS — Z87891 Personal history of nicotine dependence: Secondary | ICD-10-CM | POA: Diagnosis not present

## 2023-09-20 DIAGNOSIS — Z1731 Human epidermal growth factor receptor 2 positive status: Secondary | ICD-10-CM | POA: Diagnosis not present

## 2023-09-20 DIAGNOSIS — Z1721 Progesterone receptor positive status: Secondary | ICD-10-CM | POA: Insufficient documentation

## 2023-09-20 DIAGNOSIS — C50411 Malignant neoplasm of upper-outer quadrant of right female breast: Secondary | ICD-10-CM | POA: Diagnosis not present

## 2023-09-20 LAB — CMP (CANCER CENTER ONLY)
ALT: 12 U/L (ref 0–44)
AST: 16 U/L (ref 15–41)
Albumin: 4 g/dL (ref 3.5–5.0)
Alkaline Phosphatase: 103 U/L (ref 38–126)
Anion gap: 5 (ref 5–15)
BUN: 15 mg/dL (ref 8–23)
CO2: 30 mmol/L (ref 22–32)
Calcium: 8.9 mg/dL (ref 8.9–10.3)
Chloride: 98 mmol/L (ref 98–111)
Creatinine: 0.89 mg/dL (ref 0.44–1.00)
GFR, Estimated: >60 mL/min (ref >60)
Glucose, Bld: 104 mg/dL — ABNORMAL HIGH (ref 70–99)
Potassium: 4.5 mmol/L (ref 3.5–5.1)
Sodium: 133 mmol/L — ABNORMAL LOW (ref 135–145)
Total Bilirubin: 0.4 mg/dL (ref 0.0–1.2)
Total Protein: 6.7 g/dL (ref 6.5–8.1)

## 2023-09-20 LAB — CBC WITH DIFFERENTIAL (CANCER CENTER ONLY)
Abs Immature Granulocytes: 0.02 10*3/uL (ref 0.00–0.07)
Basophils Absolute: 0.1 10*3/uL (ref 0.0–0.1)
Basophils Relative: 1 %
Eosinophils Absolute: 0.2 10*3/uL (ref 0.0–0.5)
Eosinophils Relative: 3 %
HCT: 32 % — ABNORMAL LOW (ref 36.0–46.0)
Hemoglobin: 10.4 g/dL — ABNORMAL LOW (ref 12.0–15.0)
Immature Granulocytes: 0 %
Lymphocytes Relative: 15 %
Lymphs Abs: 1 10*3/uL (ref 0.7–4.0)
MCH: 25.6 pg — ABNORMAL LOW (ref 26.0–34.0)
MCHC: 32.5 g/dL (ref 30.0–36.0)
MCV: 78.6 fL — ABNORMAL LOW (ref 80.0–100.0)
Monocytes Absolute: 0.9 10*3/uL (ref 0.1–1.0)
Monocytes Relative: 14 %
Neutro Abs: 4.4 10*3/uL (ref 1.7–7.7)
Neutrophils Relative %: 67 %
Platelet Count: 303 10*3/uL (ref 150–400)
RBC: 4.07 MIL/uL (ref 3.87–5.11)
RDW: 15.5 % (ref 11.5–15.5)
WBC Count: 6.5 10*3/uL (ref 4.0–10.5)
nRBC: 0 % (ref 0.0–0.2)

## 2023-09-20 MED ORDER — SODIUM CHLORIDE 0.9 % IV SOLN: Freq: Once | INTRAVENOUS | Status: AC

## 2023-09-20 MED ORDER — DIPHENHYDRAMINE HCL 25 MG PO CAPS
50.0000 mg | ORAL_CAPSULE | Freq: Once | ORAL | Status: AC
  Administered 2023-09-20: 50 mg via ORAL
  Filled 2023-09-20: qty 2

## 2023-09-20 MED ORDER — SODIUM CHLORIDE 0.9% FLUSH
10.0000 mL | INTRAVENOUS | Status: DC | PRN
  Administered 2023-09-20: 10 mL

## 2023-09-20 MED ORDER — HEPARIN SOD (PORK) LOCK FLUSH 100 UNIT/ML IV SOLN
500.0000 [IU] | Freq: Once | INTRAVENOUS | Status: AC | PRN
  Administered 2023-09-20: 500 [IU]

## 2023-09-20 MED ORDER — ACETAMINOPHEN 325 MG PO TABS
650.0000 mg | ORAL_TABLET | Freq: Once | ORAL | Status: AC
  Administered 2023-09-20: 650 mg via ORAL
  Filled 2023-09-20: qty 2

## 2023-09-20 MED ORDER — TRASTUZUMAB-DTTB CHEMO 150 MG IV SOLR
6.0000 mg/kg | Freq: Once | INTRAVENOUS | Status: AC
  Administered 2023-09-20: 420 mg via INTRAVENOUS
  Filled 2023-09-20: qty 20

## 2023-09-20 NOTE — Patient Instructions (Signed)
 [facility] - A DEPT OF [facility]   Discharge Instructions: Thank you for choosing [facility] to provide your oncology and hematology care.   If you have a lab appointment with the Cancer Center, please go directly to the Cancer Center and check in at the registration area.   Wear comfortable clothing and clothing appropriate for easy access to any Portacath or PICC line.   We strive to give you quality time with your provider. You may need to reschedule your appointment if you arrive late (15 or more minutes).  Arriving late affects you and other patients whose appointments are after yours.  Also, if you miss three or more appointments without notifying the office, you may be dismissed from the clinic at the provider's discretion.      For prescription refill requests, have your pharmacy contact our office and allow 72 hours for refills to be completed.    Today you received the following chemotherapy and/or immunotherapy agents: Trastuzumab  (Ontruzant )      To help prevent nausea and vomiting after your treatment, we encourage you to take your nausea medication as directed.  BELOW ARE SYMPTOMS THAT SHOULD BE REPORTED IMMEDIATELY: *FEVER GREATER THAN 100.4 F (38 C) OR HIGHER *CHILLS OR SWEATING *NAUSEA AND VOMITING THAT IS NOT CONTROLLED WITH YOUR NAUSEA MEDICATION *UNUSUAL SHORTNESS OF BREATH *UNUSUAL BRUISING OR BLEEDING *URINARY PROBLEMS (pain or burning when urinating, or frequent urination) *BOWEL PROBLEMS (unusual diarrhea, constipation, pain near the anus) TENDERNESS IN MOUTH AND THROAT WITH OR WITHOUT PRESENCE OF ULCERS (sore throat, sores in mouth, or a toothache) UNUSUAL RASH, SWELLING OR PAIN  UNUSUAL VAGINAL DISCHARGE OR ITCHING   Items with * indicate a potential emergency and should be followed up as soon as possible or go to the Emergency Department if any problems should occur.  Please show the CHEMOTHERAPY ALERT CARD  or IMMUNOTHERAPY ALERT CARD at check-in to the Emergency Department and triage nurse.  Should you have questions after your visit or need to cancel or reschedule your appointment, please contact [facility] - A DEPT OF [facility]  Dept: [phone]  and follow the prompts.  Office hours are 8:00 a.m. to 4:30 p.m. Monday - Friday. Please note that voicemails left after 4:00 p.m. may not be returned until the following business day.  We are closed weekends and major holidays. You have access to a nurse at all times for urgent questions. Please call the main number to the clinic Dept: [phone] and follow the prompts.   For any non-urgent questions, you may also contact your provider using MyChart. We now offer e-Visits for anyone 66 and older to request care online for non-urgent symptoms. For details visit [email or web address].   Also download the MyChart app! Go to the app store, search MyChart, open the app, select , and log in with your MyChart username and password.

## 2023-09-20 NOTE — Assessment & Plan Note (Signed)
 Judy Morrison is a 73 year old woman with history of stage Ia triple positive breast cancer diagnosed in January 2024 status postlumpectomy and here today to begin adjuvant chemotherapy and immunotherapy with paclitaxel  and trastuzumab .  She is currently on Herceptin /Anastrozole    Hypertension Recent elevated blood pressure readings at urgent care, but normal reading today. Patient recently started Carvedilol , likely contributing to improved control. Anastrozole  may also contribute to elevated blood pressure. -Advise patient to monitor blood pressure at home, particularly in the mornings before coffee, and maintain a log to bring to next appointment.  Urinary Tract Infection Recently treated with Keflex. -No further action required at this time.  Gait Abnormality Patient reports walking difficulties since chemotherapy, attributing it to neuropathy. Physical therapy has been beneficial but sessions have been exhausted. -Referral to physical therapy at [facility] for continued management. Patient to follow up with therapy center if no contact within a week Judy Morrison.  This infection increasing [name] agreed upon just talking with.  Weight Gain Patient reports weight gain, possibly related to Anastrozole  and decreased physical activity due to gait abnormality. -Advise patient to reduce portion sizes by 20%, increase water intake, and make healthier snack choices. Encourage patient to resume walking as tolerated, consider use of a cane for stability, and explore other forms of exercise such as water aerobics.  Follow-up Patient has a follow-up appointment with Dr. Ebbie in February. Patient to continue Anastrozole  until completion in March. Patient to have an echocardiogram in March.

## 2023-09-20 NOTE — Progress Notes (Signed)
 [facility] Cancer Follow up:    Medicine, [facility] [address] [address] [place] [address]   DIAGNOSIS:  Cancer Staging  Malignant neoplasm of upper-outer quadrant of right breast in female, estrogen receptor positive (HCC) Staging form: Breast, AJCC 8th Edition - Pathologic stage from 10/05/2022: Stage IA (pT1c, pN0, cM0, G2, ER+, PR+, HER2+) - Signed by Crawford [name] Pickle, NP on 11/08/2022 Stage prefix: Initial diagnosis Histologic grading system: 3 grade system   SUMMARY OF ONCOLOGIC HISTORY: Oncology History  Malignant neoplasm of upper-outer quadrant of right breast in female, estrogen receptor positive (HCC)  08/02/2022 Mammogram   In the right breast, possible distortion warrants further evaluation. In the left breast, no findings suspicious for malignancy. Diagnostic mammogram showed persistent subtle distortion central slightly LATERAL aspect of the RIGHT breast warranting tissue diagnosis.     09/01/2022 Pathology Results   Final pathology showed grade 2 invasive mammary carcinoma, prognostic showed ER 90% positive strong staining PR 90% positive strong staining, Ki-67 of 5% and HER2 3+ by IHC   09/10/2022 Initial Diagnosis   Malignant neoplasm of upper-outer quadrant of right breast in female, estrogen receptor positive (HCC)    Genetic Testing   [facility] CancerNext-Expanded Panel+RNA was Positive. 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 Report date is 10/04/2022.  The CancerNext-Expanded gene panel offered by [facility] and includes sequencing, rearrangement, and RNA analysis for the following 77 genes: AIP, ALK, APC, ATM, AXIN2, BAP1, BARD1, BLM, BMPR1A, BRCA1, BRCA2, BRIP1, CDC73, CDH1, CDK4, CDKN1B, CDKN2A, CHEK2, CTNNA1, DICER1, FANCC, FH, FLCN, GALNT12, KIF1B, LZTR1, MAX, MEN1, MET, MLH1, MSH2, MSH3, MSH6,  MUTYH, NBN, NF1, NF2, NTHL1, PALB2, PHOX2B, PMS2, POT1, PRKAR1A, PTCH1, PTEN, RAD51C, RAD51D, RB1, RECQL, RET, SDHA, SDHAF2, SDHB, SDHC, SDHD, SMAD4, SMARCA4, SMARCB1, SMARCE1, STK11, SUFU, TMEM127, TP53, TSC1, TSC2, VHL and XRCC2 (sequencing and deletion/duplication); EGFR, EGLN1, HOXB13, KIT, MITF, PDGFRA, POLD1, and POLE (sequencing only); EPCAM and GREM1 (deletion/duplication only).    10/05/2022 Surgery   Right breast lumpectomy with Dr. 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  Grade 2, 2 sentinel lymph nodes negative for metastasis.  Margins negative.  T1c, N0.   10/05/2022 Cancer Staging   Staging form: Breast, AJCC 8th Edition - Pathologic stage from 10/05/2022: Stage IA (pT1c, pN0, cM0, G2, ER+, PR+, HER2+) - Signed by Crawford [name] Pickle, NP on 11/08/2022 Stage prefix: Initial diagnosis Histologic grading system: 3 grade system   11/08/2022 -  Chemotherapy   Patient is on Treatment Plan : BREAST Paclitaxel  + Trastuzumab  q7d / Trastuzumab  q21d     03/23/2023 - 04/19/2023 Radiation Therapy   Adjuvant radiation     CURRENT THERAPY: Herceptin   Discussed the use of AI scribe software for clinical note transcription with the patient, who gave verbal consent to proceed.  History of Present Illness    The patient, with a history of breast cancer and peripheral neuropathy, presents for a follow-up visit. She has recently started two new medications, Keflex for a urinary tract infection and Carvedilol  for blood pressure management. The patient reports that her blood pressure has been higher than usual on several occasions, with one reading at 144/70. She does not have a home blood pressure monitor to track readings regularly.  The patient also reports a significant weight gain, which has required her to purchase a new wardrobe. She attributes this weight gain to  decreased physical activity due to foot surgery and subsequent cancer treatment. She expresses a  desire to increase her physical activity and strength through physical therapy, personal training, and yoga.  The patient has been receiving physical therapy for the past four years for various issues, including foot surgery, shoulder issues, knee problems, and sciatica. She believes that her current leg and hip pain is due to neuropathy from chemotherapy. She has been working with the same two physical therapists and would like to continue therapy despite having used up her allotted sessions.  The patient also mentions that she is getting hearing aids next week, as she often has difficulty hearing and understanding conversations.  She is otherwise tolerating anastrozole  well.  She will be completing  her Herceptin  in the next few weeks. Rest of the pertinent 10 point ROS reviewed and negative  Patient Active Problem List   Diagnosis Date Noted  . Cognitive changes 05/02/2023  . Port-A-Cath in place 11/15/2022  . Genetic testing 10/04/2022  . 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 Malignant neoplasm of upper-outer quadrant of right breast in female, estrogen receptor positive (HCC) 09/10/2022  . Rash and other nonspecific skin eruption 09/15/2020  . Allergic contact dermatitis 09/15/2020  . Pruritus 08/14/2020  . Dizziness 05/10/2018  . Atypical chest pain 05/10/2018  . Primary osteoarthritis of left knee 01/06/2018  . S/P knee replacement 08/12/2017  . Incomplete tear of right rotator cuff 05/19/2017  . Osteopenia 05/19/2017  . Urge incontinence of urine 05/19/2017  . Primary open angle glaucoma (POAG) 12/30/2016  . Primary osteoarthritis of both knees 12/30/2016  . Recurrent oral herpes simplex infection 12/30/2016  . Tubular adenoma of colon 12/30/2016  . Hypothyroidism 06/25/2016  . Right foot pain 12/19/2014  . Anxiety 05/21/2011  . Seasonal allergies 05/21/2011    is allergic to penicillin g, cephalexin, codeine, and erythromycin.  MEDICAL HISTORY: Past Medical  History:  Diagnosis Date  . Anemia   . Anxiety   . Arthritis   . Breast cancer (HCC)   . Depressed   . Dermatitis    rare form, on prednisone   . GERD (gastroesophageal reflux disease)    controlled with Omeprazole   . Glaucoma   . Personal history of chemotherapy   . Personal history of radiation therapy   . Torn rotator cuff    RIGHT   . Urinary tract infection    dx 05-26-17 took 1 week cipro  BID completed 06-02-17.    SURGICAL HISTORY: Past Surgical History:  Procedure Laterality Date  . ADENOIDECTOMY    . APPENDECTOMY    . arthroscopic right knee     . BREAST BIOPSY Right 09/01/2022   MM RT BREAST BX W LOC DEV 1ST LESION IMAGE BX SPEC STEREO GUIDE 09/01/2022 [facility]  . BREAST BIOPSY  10/01/2022   MM RT RADIOACTIVE SEED LOC MAMMO GUIDE 10/01/2022 [facility]  . BREAST LUMPECTOMY    . BREAST LUMPECTOMY WITH RADIOACTIVE SEED AND SENTINEL LYMPH NODE BIOPSY Right 10/05/2022   Procedure: RIGHT BREAST LUMPECTOMY WITH RADIOACTIVE SEED AND AXILLARY SENTINEL LYMPH NODE BIOPSY;  Surgeon: Ebbie Cough, MD;  Location: [facility];  Service: General;  Laterality: Right;  . CHOLECYSTECTOMY    . COLONOSCOPY  06/06/2017   Pyrtle  . Pelvic sling     x2  . POLYPECTOMY    . PORT A CATH REVISION N/A 11/22/2022   Procedure: PORT A CATH REVISION;  Surgeon: Ebbie Cough, MD;  Location: [facility] OR;  Service: General;  Laterality: N/A;  . PORTACATH PLACEMENT Left 10/05/2022   Procedure: INSERTION PORT-A-CATH;  Surgeon: Ebbie Cough, MD;  Location: [facility];  Service: General;  Laterality: Left;  . ROTATOR CUFF REPAIR Right   . TONSILLECTOMY    . TOTAL KNEE ARTHROPLASTY Right 08/12/2017   Procedure: RIGHT TOTAL KNEE ARTHROPLASTY;  Surgeon: Gerome Charleston, MD;  Location: [facility];  Service: Orthopedics;  Laterality: Right;  . TOTAL KNEE ARTHROPLASTY Left 01/06/2018   Procedure: LEFT TOTAL KNEE ARTHROPLASTY;  Surgeon: Gerome Charleston,  MD;  Location: [facility];  Service: Orthopedics;  Laterality: Left;  . UPPER GASTROINTESTINAL ENDOSCOPY    . WISDOM TOOTH EXTRACTION      SOCIAL HISTORY: Social History   Socioeconomic History  . Marital status: Single    Spouse name: Not on file  . Number of children: Not on file  . Years of education: Not on file  . Highest education level: Not on file  Occupational History  . Not on file  Tobacco Use  . Smoking status: Former    Current packs/day: 0.00    Types: Cigarettes    Quit date: 1986    Years since quitting: 39.0  . Smokeless tobacco: Never  Vaping Use  . Vaping status: Never Used  Substance and Sexual Activity  . Alcohol use: Yes    Alcohol/week: 2.0 standard drinks of alcohol    Types: 2 Cans of beer per week  . Drug use: No  . Sexual activity: Not on file  Other Topics Concern  . Not on file  Social History Narrative  . Not on file   Social Drivers of Health   Financial Resource Strain: Low Risk  (04/11/2023)   Received from [facility]   Overall Financial Resource Strain (CARDIA)   . Difficulty of Paying Living Expenses: Not hard at all  Food Insecurity: No Food Insecurity (04/11/2023)   Received from [facility]   Hunger Vital Sign   . Worried About Programme Researcher, Broadcasting/film/video in the Last Year: Never true   . Ran Out of Food in the Last Year: Never true  Transportation Needs: No Transportation Needs (04/11/2023)   Received from [facility] - Transportation   . Lack of Transportation (Medical): No   . Lack of Transportation (Non-Medical): No  Physical Activity: Sufficiently Active (08/22/2022)   Received from [facility], [facility]   Exercise Vital Sign   . Days of Exercise per Week: 3 days   . Minutes of Exercise per Session: 50 min  Stress: No Stress Concern Present (07/28/2021)   Received from [facility], [facility] of Occupational Health - Occupational Stress Questionnaire   . Feeling of Stress : Not at  all  Social Connections: Socially Integrated (08/22/2022)   Received from [facility], [facility]   Social Network   . How would you rate your social network (family, work, friends)?: Good participation with social networks  Intimate Partner Violence: Not At Risk (08/22/2022)   Received from [facility], [facility]   HITS   . Over the last 12 months how often did your partner physically hurt you?: Never   . Over the last 12 months how often did your partner insult you or talk down to you?: Never   . Over the last 12 months how often did your partner threaten you with physical harm?: Never   . Over the last 12 months how often did your partner scream or curse  at you?: Never    FAMILY HISTORY: Family History  Problem Relation Age of Onset  . Bowel Disease Mother        ishemic bowel  . Lung cancer Mother        smoked  . Cervical cancer Paternal Aunt   . Cancer Maternal Grandfather        unknown type  . Lung cancer Paternal Grandfather        smoked  . Colon cancer Neg Hx   . Breast cancer Neg Hx   . Esophageal cancer Neg Hx   . Pancreatic cancer Neg Hx   . Prostate cancer Neg Hx   . Rectal cancer Neg Hx   . Stomach cancer Neg Hx   . Allergic rhinitis Neg Hx   . Asthma Neg Hx   . Eczema Neg Hx   . Urticaria Neg Hx   . Colon polyps Neg Hx     Review of Systems  Constitutional:  Negative for appetite change, chills, fatigue, fever and unexpected weight change.  HENT:   Negative for hearing loss, lump/mass and trouble swallowing.   Eyes:  Negative for eye problems and icterus.  Respiratory:  Negative for chest tightness, cough and shortness of breath.   Cardiovascular:  Negative for chest pain, leg swelling and palpitations.  Gastrointestinal:  Negative for abdominal distention, abdominal pain, constipation, diarrhea, nausea and vomiting.  Endocrine: Negative for hot flashes.  Genitourinary:  Negative for difficulty urinating.   Musculoskeletal:  Negative for  arthralgias.  Skin:  Negative for itching and rash.  Neurological:  Negative for dizziness, extremity weakness, headaches and numbness.  Hematological:  Negative for adenopathy. Does not bruise/bleed easily.  Psychiatric/Behavioral:  Negative for depression. The patient is not nervous/anxious.       PHYSICAL EXAMINATION    Vitals:   09/20/23 1116  BP: (!) 108/56  Pulse: 100  Resp: 18  Temp: 98.2 F (36.8 C)  SpO2: 96%     Physical Exam Constitutional:      General: She is not in acute distress.    Appearance: Normal appearance. She is not toxic-appearing.  HENT:     Head: Normocephalic and atraumatic.     Mouth/Throat:     Mouth: Mucous membranes are moist.     Pharynx: Oropharynx is clear. No oropharyngeal exudate or posterior oropharyngeal erythema.  Eyes:     General: No scleral icterus. Cardiovascular:     Rate and Rhythm: Normal rate and regular rhythm.     Pulses: Normal pulses.     Heart sounds: Normal heart sounds.  Pulmonary:     Effort: Pulmonary effort is normal.     Breath sounds: Normal breath sounds.  Abdominal:     General: Abdomen is flat. Bowel sounds are normal. There is no distension.     Palpations: Abdomen is soft.     Tenderness: There is no abdominal tenderness.  Musculoskeletal:        General: No swelling.     Cervical back: Neck supple.  Lymphadenopathy:     Cervical: No cervical adenopathy.  Skin:    General: Skin is warm and dry.     Findings: No rash.  Neurological:     General: No focal deficit present.     Mental Status: She is alert.  Psychiatric:        Mood and Affect: Mood normal.        Behavior: Behavior normal.    LABORATORY DATA:  CBC  Component Value Date/Time   WBC 6.5 09/20/2023 1105   WBC 10.1 10/01/2021 1314   RBC 4.07 09/20/2023 1105   HGB 10.4 (L) 09/20/2023 1105   HGB 13.7 09/15/2020 1209   HCT 32.0 (L) 09/20/2023 1105   HCT 41.3 09/15/2020 1209   PLT 303 09/20/2023 1105   PLT 349 09/15/2020  1209   MCV 78.6 (L) 09/20/2023 1105   MCV 86 09/15/2020 1209   MCH 25.6 (L) 09/20/2023 1105   MCHC 32.5 09/20/2023 1105   RDW 15.5 09/20/2023 1105   RDW 12.3 09/15/2020 1209   LYMPHSABS 1.0 09/20/2023 1105   LYMPHSABS 1.2 09/15/2020 1209   MONOABS 0.9 09/20/2023 1105   EOSABS 0.2 09/20/2023 1105   EOSABS 0.2 09/15/2020 1209   BASOSABS 0.1 09/20/2023 1105   BASOSABS 0.1 09/15/2020 1209    CMP     Component Value Date/Time   NA 133 (L) 09/20/2023 1105   NA 135 09/15/2020 1209   K 4.5 09/20/2023 1105   CL 98 09/20/2023 1105   CO2 30 09/20/2023 1105   GLUCOSE 104 (H) 09/20/2023 1105   BUN 15 09/20/2023 1105   BUN 14 09/15/2020 1209   CREATININE 0.89 09/20/2023 1105   CALCIUM 8.9 09/20/2023 1105   PROT 6.7 09/20/2023 1105   PROT 6.8 09/15/2020 1209   ALBUMIN 4.0 09/20/2023 1105   ALBUMIN 4.6 09/15/2020 1209   AST 16 09/20/2023 1105   ALT 12 09/20/2023 1105   ALKPHOS 103 09/20/2023 1105   BILITOT 0.4 09/20/2023 1105   GFRNONAA >60 09/20/2023 1105   GFRAA 74 09/15/2020 1209      ASSESSMENT and THERAPY PLAN:   Malignant neoplasm of upper-outer quadrant of right breast in female, estrogen receptor positive (HCC) Florella is a 73 year old woman with history of stage Ia triple positive breast cancer diagnosed in January 2024 status postlumpectomy and here today to begin adjuvant chemotherapy and immunotherapy with paclitaxel  and trastuzumab .  She is currently on Herceptin /Anastrozole    Hypertension Recent elevated blood pressure readings at urgent care, but normal reading today. Patient recently started Carvedilol , likely contributing to improved control. Anastrozole  may also contribute to elevated blood pressure. -Advise patient to monitor blood pressure at home, particularly in the mornings before coffee, and maintain a log to bring to next appointment.  Urinary Tract Infection Recently treated with Keflex. -No further action required at this time.  Gait  Abnormality Patient reports walking difficulties since chemotherapy, attributing it to neuropathy. Physical therapy has been beneficial but sessions have been exhausted. -Referral to physical therapy at [facility] for continued management. Patient to follow up with therapy center if no contact within a week Ms. Stumpo.  This infection increasing [name] agreed upon just talking with.  Weight Gain Patient reports weight gain, possibly related to Anastrozole  and decreased physical activity due to gait abnormality. -Advise patient to reduce portion sizes by 20%, increase water intake, and make healthier snack choices. Encourage patient to resume walking as tolerated, consider use of a cane for stability, and explore other forms of exercise such as water aerobics.  Follow-up Patient has a follow-up appointment with Dr. Ebbie in February. Patient to continue Anastrozole  until completion in March. Patient to have an echocardiogram in March.  All questions were answered. The patient knows to call the clinic with any problems, questions or concerns. We can certainly see the patient much sooner if necessary.  Total encounter time:30 minutes*in face-to-face visit time, chart review, lab review, care coordination, order entry, and documentation of the  encounter time.  *Total Encounter Time as defined by the Centers for Medicare and Medicaid Services includes, in addition to the face-to-face time of a patient visit (documented in the note above) non-face-to-face time: obtaining and reviewing outside history, ordering and reviewing medications, tests or procedures, care coordination (communications with other health care professionals or caregivers) and documentation in the medical record.

## 2023-09-20 NOTE — Progress Notes (Signed)
 Ontruzant 420mg  appropriate for today's dose due to vial rounding.

## 2023-09-21 ENCOUNTER — Other Ambulatory Visit: Payer: Self-pay | Admitting: Adult Health

## 2023-09-21 DIAGNOSIS — I10 Essential (primary) hypertension: Secondary | ICD-10-CM

## 2023-09-21 DIAGNOSIS — Z170 Estrogen receptor positive status [ER+]: Secondary | ICD-10-CM

## 2023-09-22 ENCOUNTER — Encounter: Payer: Self-pay | Admitting: Hematology and Oncology

## 2023-09-22 ENCOUNTER — Telehealth: Payer: Self-pay | Admitting: *Deleted

## 2023-09-22 NOTE — Telephone Encounter (Signed)
 Per MD request referral for PT at [facility] sent with receipt showing completed.

## 2023-10-07 ENCOUNTER — Telehealth (INDEPENDENT_AMBULATORY_CARE_PROVIDER_SITE_OTHER): Payer: Self-pay | Admitting: Otolaryngology

## 2023-10-07 NOTE — Telephone Encounter (Signed)
LVM to confirm appt & location [id number] afm

## 2023-10-10 ENCOUNTER — Ambulatory Visit (INDEPENDENT_AMBULATORY_CARE_PROVIDER_SITE_OTHER): Payer: Medicare Other | Admitting: Audiology

## 2023-10-10 ENCOUNTER — Ambulatory Visit: Payer: Medicare Other | Attending: General Surgery

## 2023-10-10 ENCOUNTER — Ambulatory Visit (INDEPENDENT_AMBULATORY_CARE_PROVIDER_SITE_OTHER): Payer: Medicare Other

## 2023-10-10 VITALS — BP 118/90 | HR 88

## 2023-10-10 VITALS — Wt 171.2 lb

## 2023-10-10 DIAGNOSIS — R42 Dizziness and giddiness: Secondary | ICD-10-CM

## 2023-10-10 DIAGNOSIS — Z483 Aftercare following surgery for neoplasm: Secondary | ICD-10-CM | POA: Insufficient documentation

## 2023-10-10 DIAGNOSIS — H903 Sensorineural hearing loss, bilateral: Secondary | ICD-10-CM | POA: Diagnosis not present

## 2023-10-10 NOTE — Therapy (Signed)
OUTPATIENT PHYSICAL THERAPY SOZO SCREENING NOTE   Patient Name: Judy Morrison MRN: [id number] DOB:1950/11/18, 73 y.o., female Today's Date: 10/10/2023  PCP: Medicine, [facility] REFERRING PROVIDER: [name], MD   PT End of Session - 10/10/23 1023     Visit Number 4   # unchanged due to screen only   PT Start Time 1021    PT Stop Time 1025    PT Time Calculation (min) 4 min    Activity Tolerance Patient tolerated treatment well    Behavior During Therapy WFL for tasks assessed/performed             Past Medical History:  Diagnosis Date   Anemia    Anxiety    Arthritis    Breast cancer (HCC)    Depressed    Dermatitis    rare form, on prednisone   GERD (gastroesophageal reflux disease)    controlled with Omeprazole   Glaucoma    Personal history of chemotherapy    Personal history of radiation therapy    Torn rotator cuff    RIGHT    Urinary tract infection    dx 05-26-17 took 1 week cipro BID completed 06-02-17.   Past Surgical History:  Procedure Laterality Date   ADENOIDECTOMY     APPENDECTOMY     arthroscopic right knee      BREAST BIOPSY Right 09/01/2022   MM RT BREAST BX W LOC DEV 1ST LESION IMAGE BX SPEC STEREO GUIDE 09/01/2022 [facility]   BREAST BIOPSY  10/01/2022   MM RT RADIOACTIVE SEED LOC MAMMO GUIDE 10/01/2022 [facility]   BREAST LUMPECTOMY     BREAST LUMPECTOMY WITH RADIOACTIVE SEED AND SENTINEL LYMPH NODE BIOPSY Right 10/05/2022   Procedure: RIGHT BREAST LUMPECTOMY WITH RADIOACTIVE SEED AND AXILLARY SENTINEL LYMPH NODE BIOPSY;  Surgeon: [name], MD;  Location: [facility];  Service: General;  Laterality: Right;   CHOLECYSTECTOMY     COLONOSCOPY  06/06/2017   Pyrtle   Pelvic sling     x2   POLYPECTOMY     PORT A CATH REVISION N/A 11/22/2022   Procedure: PORT A CATH REVISION;  Surgeon: [name], MD;  Location: [facility] OR;  Service: General;  Laterality: N/A;    PORTACATH PLACEMENT Left 10/05/2022   Procedure: INSERTION PORT-A-CATH;  Surgeon: [name], MD;  Location: [facility];  Service: General;  Laterality: Left;   ROTATOR CUFF REPAIR Right    TONSILLECTOMY     TOTAL KNEE ARTHROPLASTY Right 08/12/2017   Procedure: RIGHT TOTAL KNEE ARTHROPLASTY;  Surgeon: Eugenia Mcalpine, MD;  Location: [facility];  Service: Orthopedics;  Laterality: Right;   TOTAL KNEE ARTHROPLASTY Left 01/06/2018   Procedure: LEFT TOTAL KNEE ARTHROPLASTY;  Surgeon: Eugenia Mcalpine, MD;  Location: [facility];  Service: Orthopedics;  Laterality: Left;   UPPER GASTROINTESTINAL ENDOSCOPY     WISDOM TOOTH EXTRACTION     Patient Active Problem List   Diagnosis Date Noted   Cognitive changes 05/02/2023   Port-A-Cath in place 11/15/2022   Genetic testing 10/04/2022   Monoallelic mutation of ATM gene 40/98/1191   Malignant neoplasm of upper-outer quadrant of right breast in female, estrogen receptor positive (HCC) 09/10/2022   Rash and other nonspecific skin eruption 09/15/2020   Allergic contact dermatitis 09/15/2020   Pruritus 08/14/2020   Dizziness 05/10/2018   Atypical chest pain 05/10/2018   Primary osteoarthritis of left knee 01/06/2018   S/P knee replacement 08/12/2017   Incomplete tear of right  rotator cuff 05/19/2017   Osteopenia 05/19/2017   Urge incontinence of urine 05/19/2017   Primary open angle glaucoma (POAG) 12/30/2016   Primary osteoarthritis of both knees 12/30/2016   Recurrent oral herpes simplex infection 12/30/2016   Tubular adenoma of colon 12/30/2016   Hypothyroidism 06/25/2016   Right foot pain 12/19/2014   Anxiety 05/21/2011   Seasonal allergies 05/21/2011    REFERRING DIAG: right breast cancer at risk for lymphedema  THERAPY DIAG: Aftercare following surgery for neoplasm  PERTINENT HISTORY: Patient was diagnosed on 08/02/2022 with right grade 2 invasive ductal carcinoma breast cancer. It measures 1.5 cm and is located in the  upper outer quadrant. It is triple positive with a Ki67 of 5%. She had a rotator cuff repair in her right shoulder 10/01/2021 which continues to be problematic. She had a right lumpectomy with SLNB on 10/05/2022   PRECAUTIONS: right UE Lymphedema risk, None  SUBJECTIVE: Pt returns for her 3 month L-Dex screen .  PAIN:  Are you having pain? No  SOZO SCREENING: Patient was assessed today using the SOZO machine to determine the lymphedema index score. This was compared to her baseline score. It was determined that she is within the recommended range when compared to her baseline and no further action is needed at this time. She will continue SOZO screenings. These are done every 3 months for 2 years post operatively followed by every 6 months for 2 years, and then annually.   L-DEX FLOWSHEETS - 10/10/23 1000       L-DEX LYMPHEDEMA SCREENING   Measurement Type Unilateral    L-DEX MEASUREMENT EXTREMITY Upper Extremity    POSITION  Standing    DOMINANT SIDE Right    At Risk Side Right    BASELINE SCORE (UNILATERAL) -3.4    L-DEX SCORE (UNILATERAL) 0.4    VALUE CHANGE (UNILAT) 3.8              Hermenia Bers, PTA 10/10/2023, 10:24 AM

## 2023-10-11 ENCOUNTER — Other Ambulatory Visit: Payer: Medicare Other

## 2023-10-11 ENCOUNTER — Other Ambulatory Visit: Payer: Self-pay

## 2023-10-11 ENCOUNTER — Inpatient Hospital Stay: Payer: Medicare Other

## 2023-10-11 ENCOUNTER — Inpatient Hospital Stay (HOSPITAL_BASED_OUTPATIENT_CLINIC_OR_DEPARTMENT_OTHER): Payer: Medicare Other | Admitting: Hematology and Oncology

## 2023-10-11 VITALS — BP 142/77 | HR 70 | Resp 16

## 2023-10-11 VITALS — BP 124/72 | HR 78 | Temp 97.9°F | Resp 16 | Wt 172.3 lb

## 2023-10-11 DIAGNOSIS — C50411 Malignant neoplasm of upper-outer quadrant of right female breast: Secondary | ICD-10-CM | POA: Diagnosis not present

## 2023-10-11 DIAGNOSIS — Z170 Estrogen receptor positive status [ER+]: Secondary | ICD-10-CM

## 2023-10-11 DIAGNOSIS — Z95828 Presence of other vascular implants and grafts: Secondary | ICD-10-CM

## 2023-10-11 DIAGNOSIS — D509 Iron deficiency anemia, unspecified: Secondary | ICD-10-CM

## 2023-10-11 DIAGNOSIS — H903 Sensorineural hearing loss, bilateral: Secondary | ICD-10-CM | POA: Insufficient documentation

## 2023-10-11 DIAGNOSIS — Z5112 Encounter for antineoplastic immunotherapy: Secondary | ICD-10-CM | POA: Diagnosis not present

## 2023-10-11 HISTORY — DX: Sensorineural hearing loss, bilateral: H90.3

## 2023-10-11 LAB — CBC WITH DIFFERENTIAL (CANCER CENTER ONLY)
Abs Immature Granulocytes: 0.01 10*3/uL (ref 0.00–0.07)
Basophils Absolute: 0 10*3/uL (ref 0.0–0.1)
Basophils Relative: 1 %
Eosinophils Absolute: 0.2 10*3/uL (ref 0.0–0.5)
Eosinophils Relative: 4 %
HCT: 33.2 % — ABNORMAL LOW (ref 36.0–46.0)
Hemoglobin: 10.9 g/dL — ABNORMAL LOW (ref 12.0–15.0)
Immature Granulocytes: 0 %
Lymphocytes Relative: 15 %
Lymphs Abs: 0.7 10*3/uL (ref 0.7–4.0)
MCH: 25.3 pg — ABNORMAL LOW (ref 26.0–34.0)
MCHC: 32.8 g/dL (ref 30.0–36.0)
MCV: 77 fL — ABNORMAL LOW (ref 80.0–100.0)
Monocytes Absolute: 0.8 10*3/uL (ref 0.1–1.0)
Monocytes Relative: 17 %
Neutro Abs: 3.1 10*3/uL (ref 1.7–7.7)
Neutrophils Relative %: 63 %
Platelet Count: 304 10*3/uL (ref 150–400)
RBC: 4.31 MIL/uL (ref 3.87–5.11)
RDW: 14.9 % (ref 11.5–15.5)
WBC Count: 4.9 10*3/uL (ref 4.0–10.5)
nRBC: 0 % (ref 0.0–0.2)

## 2023-10-11 LAB — CMP (CANCER CENTER ONLY)
ALT: 16 U/L (ref 0–44)
AST: 17 U/L (ref 15–41)
Albumin: 4.1 g/dL (ref 3.5–5.0)
Alkaline Phosphatase: 113 U/L (ref 38–126)
Anion gap: 6 (ref 5–15)
BUN: 11 mg/dL (ref 8–23)
CO2: 29 mmol/L (ref 22–32)
Calcium: 9.2 mg/dL (ref 8.9–10.3)
Chloride: 92 mmol/L — ABNORMAL LOW (ref 98–111)
Creatinine: 0.91 mg/dL (ref 0.44–1.00)
GFR, Estimated: >60 mL/min (ref >60)
Glucose, Bld: 89 mg/dL (ref 70–99)
Potassium: 3.9 mmol/L (ref 3.5–5.1)
Sodium: 127 mmol/L — ABNORMAL LOW (ref 135–145)
Total Bilirubin: 0.4 mg/dL (ref 0.0–1.2)
Total Protein: 6.8 g/dL (ref 6.5–8.1)

## 2023-10-11 MED ORDER — SODIUM CHLORIDE 0.9% FLUSH
10.0000 mL | INTRAVENOUS | Status: DC | PRN
Start: 2023-10-11 — End: 2023-10-11
  Administered 2023-10-11: 10 mL

## 2023-10-11 MED ORDER — ACETAMINOPHEN 325 MG PO TABS
650.0000 mg | ORAL_TABLET | Freq: Once | ORAL | Status: AC
Start: 2023-10-11 — End: 2023-10-11
  Administered 2023-10-11: 650 mg via ORAL
  Filled 2023-10-11: qty 2

## 2023-10-11 MED ORDER — SODIUM CHLORIDE 0.9 % IV SOLN: Freq: Once | INTRAVENOUS | Status: AC

## 2023-10-11 MED ORDER — TRASTUZUMAB-DTTB CHEMO 150 MG IV SOLR
6.0000 mg/kg | Freq: Once | INTRAVENOUS | Status: AC
  Administered 2023-10-11: 420 mg via INTRAVENOUS
  Filled 2023-10-11: qty 20

## 2023-10-11 MED ORDER — HEPARIN SOD (PORK) LOCK FLUSH 100 UNIT/ML IV SOLN
500.0000 [IU] | Freq: Once | INTRAVENOUS | Status: AC | PRN
  Administered 2023-10-11: 500 [IU]

## 2023-10-11 MED ORDER — SODIUM CHLORIDE 0.9% FLUSH
10.0000 mL | Freq: Once | INTRAVENOUS | Status: AC
  Administered 2023-10-11: 10 mL

## 2023-10-11 MED ORDER — DIPHENHYDRAMINE HCL 25 MG PO CAPS
50.0000 mg | ORAL_CAPSULE | Freq: Once | ORAL | Status: AC
Start: 2023-10-11 — End: 2023-10-11
  Administered 2023-10-11: 50 mg via ORAL
  Filled 2023-10-11: qty 2

## 2023-10-11 NOTE — Assessment & Plan Note (Signed)
Breast Cancer Patient is nearing the end of her Herceptin treatment and is currently taking anastrozole with no reported issues. - Continue anastrozole as prescribed. - Follow-up visits every six months after the last treatment.  Balance Issues Patient reports feeling "wobbly" and has been referred to a physical therapy group for balance therapy by her ENT, Dr. Suszanne Conners - Proceed with balance therapy as recommended by ENT.  Fatigue/Weakness Patient reports feeling "wilted" and weak. She is currently working with a trainer twice a week focusing on leg and arm strength. - Encourage continued physical activity and self-motivation.  Possible Iron Deficiency Hemoglobin levels are slightly low. - Start over-the-counter iron supplement (65mg  every other day). - Check iron levels at next blood work appointment.  Dental Concerns Patient has a joint replacement and was previously advised to take an antibiotic before dental appointments. - No contraindications from oncology standpoint for dental work. Patient can proceed with dental appointments as planned.

## 2023-10-11 NOTE — Progress Notes (Signed)
Patient ID: Judy Morrison, female   DOB: [birth year], 73 y.o.   MRN: [id number]  Follow-up: Oral lichenoid mucositis New complaint: Hearing loss, balance difficulty  HPI: The patient is a 73 year old female who presents today with her daughter.  The patient was previously seen for her oral lichenoid mucositis.  The mucositis has since resolved.  The patient has a new complaint today of bilateral progressive hearing loss and balance difficulties/dizziness.  According to the daughter, the patient's symptoms started after she underwent chemotherapy to treat her breast cancer.  She was evaluated at [facility], and was noted to have bilateral high-frequency sensorineural hearing loss.  She was fitted with bilateral hearing aids.  In addition to her hearing loss, the patient has also noted increasing difficulty with her balance.  She has a frequent lightheaded sensation.  She denies any spinning vertigo.  She denies any otalgia or otorrhea.  Exam: General: Communicates without difficulty, well nourished, no acute distress. Head: Normocephalic, no evidence injury, no tenderness, facial buttresses intact without stepoff. Face/sinus: No tenderness to palpation and percussion. Facial movement is normal and symmetric. Eyes: PERRL, EOMI. No scleral icterus, conjunctivae clear. Neuro: CN II exam reveals vision grossly intact.  No nystagmus at any point of gaze. Ears: Auricles well formed without lesions.  Ear canals are intact without mass or lesion.  No erythema or edema is appreciated.  The TMs are intact without fluid. Nose: External evaluation reveals normal support and skin without lesions.  Dorsum is intact.  Anterior rhinoscopy reveals normal mucosa over anterior aspect of inferior turbinates and intact septum.  No purulence noted. Oral:  Oral cavity and oropharynx are intact, symmetric, without erythema or edema.  Mucosa is moist without lesions. Neck: Full range of motion without pain.  There is no significant  lymphadenopathy.  No masses palpable.  Thyroid bed within normal limits to palpation.  Parotid glands and submandibular glands equal bilaterally without mass.  Trachea is midline. Neuro:  CN 2-12 grossly intact. Vestibular: No nystagmus at any point of gaze. Dix Hallpike negative. Vestibular: There is no nystagmus with pneumatic pressure on either tympanic membrane or Valsalva. The cerebellar examination is unremarkable.    Her hearing test from [facility] showed bilateral symmetric high-frequency sensorineural hearing loss.  Assessment: 1.  Bilateral symmetric high-frequency sensorineural hearing loss.  This is likely secondary to presbycusis versus ototoxicity from her chemotherapy. 2.  Balance difficulty, which may be secondary to vestibular dysfunction versus multifactorial causes. 3.  Her ear canals, tympanic membranes, and middle ear spaces are all normal.  Her Dix-Hallpike maneuver is negative.  Plan: 1.  The physical exam findings and the hearing test results are reviewed with the patient and her daughter. 2.  Continue the use of her hearing aids. 3.  The pathophysiology of vestibular dysfunction and dizziness are discussed extensively with the patient. The possible differential diagnoses are reviewed. Questions are invited and answered.   4.  The patient will likely benefit from undergoing physical therapy/vestibular rehabilitation to improve her balancing function. A referral will be arranged as soon as possible.  5.  If the patient continues to be symptomatic, she may benefit from vestibular neurodiagnostic testing at a tertiary care center to evaluate for possible vestibular dysfunction.

## 2023-10-11 NOTE — Patient Instructions (Signed)
[facility] - A DEPT OF [facility]  Discharge Instructions: Thank you for choosing [facility] to provide your oncology and hematology care.   If you have a lab appointment with the Cancer Center, please go directly to the Cancer Center and check in at the registration area.   Wear comfortable clothing and clothing appropriate for easy access to any Portacath or PICC line.   We strive to give you quality time with your provider. You may need to reschedule your appointment if you arrive late (15 or more minutes).  Arriving late affects you and other patients whose appointments are after yours.  Also, if you miss three or more appointments without notifying the office, you may be dismissed from the clinic at the provider's discretion.      For prescription refill requests, have your pharmacy contact our office and allow 72 hours for refills to be completed.    Today you received the following chemotherapy and/or immunotherapy agents Ontruzant      To help prevent nausea and vomiting after your treatment, we encourage you to take your nausea medication as directed.  BELOW ARE SYMPTOMS THAT SHOULD BE REPORTED IMMEDIATELY: *FEVER GREATER THAN 100.4 F (38 C) OR HIGHER *CHILLS OR SWEATING *NAUSEA AND VOMITING THAT IS NOT CONTROLLED WITH YOUR NAUSEA MEDICATION *UNUSUAL SHORTNESS OF BREATH *UNUSUAL BRUISING OR BLEEDING *URINARY PROBLEMS (pain or burning when urinating, or frequent urination) *BOWEL PROBLEMS (unusual diarrhea, constipation, pain near the anus) TENDERNESS IN MOUTH AND THROAT WITH OR WITHOUT PRESENCE OF ULCERS (sore throat, sores in mouth, or a toothache) UNUSUAL RASH, SWELLING OR PAIN  UNUSUAL VAGINAL DISCHARGE OR ITCHING   Items with * indicate a potential emergency and should be followed up as soon as possible or go to the Emergency Department if any problems should occur.  Please show the CHEMOTHERAPY ALERT CARD or IMMUNOTHERAPY  ALERT CARD at check-in to the Emergency Department and triage nurse.  Should you have questions after your visit or need to cancel or reschedule your appointment, please contact [facility] - A DEPT OF [facility]  Dept: [phone]  and follow the prompts.  Office hours are 8:00 a.m. to 4:30 p.m. Monday - Friday. Please note that voicemails left after 4:00 p.m. may not be returned until the following business day.  We are closed weekends and major holidays. You have access to a nurse at all times for urgent questions. Please call the main number to the clinic Dept: [phone] and follow the prompts.   For any non-urgent questions, you may also contact your provider using MyChart. We now offer e-Visits for anyone 35 and older to request care online for non-urgent symptoms. For details visit [email or web address].   Also download the MyChart app! Go to the app store, search "MyChart", open the app, select [place], and log in with your MyChart username and password.

## 2023-10-11 NOTE — Progress Notes (Signed)
[facility] Cancer Follow up:    Medicine, [facility] [address] [place] [address]   DIAGNOSIS:  Cancer Staging  Malignant neoplasm of upper-outer quadrant of right breast in female, estrogen receptor positive (HCC) Staging form: Breast, AJCC 8th Edition - Pathologic stage from 10/05/2022: Stage IA (pT1c, pN0, cM0, G2, ER+, PR+, HER2+) - Signed by Loa Socks, NP on 11/08/2022 Stage prefix: Initial diagnosis Histologic grading system: 3 grade system   SUMMARY OF ONCOLOGIC HISTORY: Oncology History  Malignant neoplasm of upper-outer quadrant of right breast in female, estrogen receptor positive (HCC)  08/02/2022 Mammogram   In the right breast, possible distortion warrants further evaluation. In the left breast, no findings suspicious for malignancy. Diagnostic mammogram showed persistent subtle distortion central slightly LATERAL aspect of the RIGHT breast warranting tissue diagnosis.     09/01/2022 Pathology Results   Final pathology showed grade 2 invasive mammary carcinoma, prognostic showed ER 90% positive strong staining PR 90% positive strong staining, Ki-67 of 5% and HER2 3+ by [facility]   09/10/2022 Initial Diagnosis   Malignant neoplasm of upper-outer quadrant of right breast in female, estrogen receptor positive (HCC)    Genetic Testing   [facility] CancerNext-Expanded Panel+RNA was Positive. 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 Report date is 10/04/2022.  The CancerNext-Expanded gene panel offered by [facility] and includes sequencing, rearrangement, and RNA analysis for the following 77 genes: AIP, ALK, APC, ATM, AXIN2, BAP1, BARD1, BLM, BMPR1A, BRCA1, BRCA2, BRIP1, CDC73, CDH1, CDK4, CDKN1B, CDKN2A, CHEK2, CTNNA1, DICER1, FANCC, FH, FLCN, GALNT12, KIF1B, LZTR1, MAX, MEN1, MET, MLH1, MSH2, MSH3, MSH6,  MUTYH, NBN, NF1, NF2, NTHL1, PALB2, PHOX2B, PMS2, POT1, PRKAR1A, PTCH1, PTEN, RAD51C, RAD51D, RB1, RECQL, RET, SDHA, SDHAF2, SDHB, SDHC, SDHD, SMAD4, SMARCA4, SMARCB1, SMARCE1, STK11, SUFU, TMEM127, TP53, TSC1, TSC2, VHL and XRCC2 (sequencing and deletion/duplication); EGFR, EGLN1, HOXB13, KIT, MITF, PDGFRA, POLD1, and POLE (sequencing only); EPCAM and GREM1 (deletion/duplication only).    10/05/2022 Surgery   Right breast lumpectomy with Dr. 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  Grade 2, 2 sentinel lymph nodes negative for metastasis.  Margins negative.  T1c, N0.   10/05/2022 Cancer Staging   Staging form: Breast, AJCC 8th Edition - Pathologic stage from 10/05/2022: Stage IA (pT1c, pN0, cM0, G2, ER+, PR+, HER2+) - Signed by Loa Socks, NP on 11/08/2022 Stage prefix: Initial diagnosis Histologic grading system: 3 grade system   11/08/2022 -  Chemotherapy   Patient is on Treatment Plan : BREAST Paclitaxel + Trastuzumab q7d / Trastuzumab q21d     03/23/2023 - 04/19/2023 Radiation Therapy   Adjuvant radiation     CURRENT THERAPY: Herceptin  Discussed the use of AI scribe software for clinical note transcription with the patient, who gave verbal consent to proceed.  History of Present Illness    The patient, with a history of breast cancer and peripheral neuropathy, presents for a follow-up visit.  The patient, with a history of breast cancer, presents with balance issues and weakness following cancer treatment. She was referred to Dr Suszanne Conners for evaluation of balance issues potentially related to past chemotherapy treatments. She will be referred to vestibular rehab.  She describes feeling 'wilted' and weak, despite working with a trainer twice a week to strengthen her legs and arms. She lacks motivation to exercise at home and mentions that her daughters encourage  her to be more active. Concerns about stumbling and falling affect her willingness to walk,  although she has a cane available for use.  No tingling or sensation in her feet, but she notes walking 'askew.' She is working on this with her trainer. She is concerned about her motivation to walk more, as she believes it would help her lose weight and improve her overall well-being.  She is currently taking anastrozole without any issues. Rest of the pertinent 10 point ROS reviewed and negative  Patient Active Problem List   Diagnosis Date Noted   Sensorineural hearing loss, bilateral 10/11/2023   Cognitive changes 05/02/2023   Port-A-Cath in place 11/15/2022   Genetic testing 10/04/2022   Monoallelic mutation of ATM gene 16/06/9603   Malignant neoplasm of upper-outer quadrant of right breast in female, estrogen receptor positive (HCC) 09/10/2022   Rash and other nonspecific skin eruption 09/15/2020   Allergic contact dermatitis 09/15/2020   Pruritus 08/14/2020   Dizziness 05/10/2018   Atypical chest pain 05/10/2018   Primary osteoarthritis of left knee 01/06/2018   S/P knee replacement 08/12/2017   Incomplete tear of right rotator cuff 05/19/2017   Osteopenia 05/19/2017   Urge incontinence of urine 05/19/2017   Primary open angle glaucoma (POAG) 12/30/2016   Primary osteoarthritis of both knees 12/30/2016   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  MEDICAL HISTORY: Past Medical History:  Diagnosis Date   Anemia    Anxiety    Arthritis    Breast cancer (HCC)    Depressed    Dermatitis    rare form, on prednisone   GERD (gastroesophageal reflux disease)    controlled with Omeprazole   Glaucoma    Personal history of chemotherapy    Personal history of radiation therapy    Torn rotator cuff    RIGHT    Urinary tract infection    dx  05-26-17 took 1 week cipro BID completed 06-02-17.    SURGICAL HISTORY: Past Surgical History:  Procedure Laterality Date   ADENOIDECTOMY     APPENDECTOMY     arthroscopic right knee      BREAST BIOPSY Right 09/01/2022   MM RT BREAST BX W LOC DEV 1ST LESION IMAGE BX SPEC STEREO GUIDE 09/01/2022 [facility]   BREAST BIOPSY  10/01/2022   MM RT RADIOACTIVE SEED LOC MAMMO GUIDE 10/01/2022 [facility]   BREAST LUMPECTOMY     BREAST LUMPECTOMY WITH RADIOACTIVE SEED AND SENTINEL LYMPH NODE BIOPSY Right 10/05/2022   Procedure: RIGHT BREAST LUMPECTOMY WITH RADIOACTIVE SEED AND AXILLARY SENTINEL LYMPH NODE BIOPSY;  Surgeon: [name], MD;  Location: [facility];  Service: General;  Laterality: Right;   CHOLECYSTECTOMY     COLONOSCOPY  06/06/2017   Pyrtle   Pelvic sling     x2   POLYPECTOMY     PORT A CATH REVISION N/A 11/22/2022   Procedure: PORT A CATH REVISION;  Surgeon: [name], MD;  Location: [facility] OR;  Service: General;  Laterality: N/A;   PORTACATH PLACEMENT Left 10/05/2022   Procedure: INSERTION PORT-A-CATH;  Surgeon: [name], MD;  Location: [facility];  Service: General;  Laterality: Left;   ROTATOR CUFF REPAIR Right    TONSILLECTOMY     TOTAL KNEE ARTHROPLASTY Right 08/12/2017  Procedure: RIGHT TOTAL KNEE ARTHROPLASTY;  Surgeon: Eugenia Mcalpine, MD;  Location: [facility];  Service: Orthopedics;  Laterality: Right;   TOTAL KNEE ARTHROPLASTY Left 01/06/2018   Procedure: LEFT TOTAL KNEE ARTHROPLASTY;  Surgeon: Eugenia Mcalpine, MD;  Location: [facility];  Service: Orthopedics;  Laterality: Left;   UPPER GASTROINTESTINAL ENDOSCOPY     WISDOM TOOTH EXTRACTION      SOCIAL HISTORY: Social History   Socioeconomic History   Marital status: Single    Spouse name: Not on file   Number of children: Not on file   Years of education: Not on file   Highest education level: Not on file  Occupational History   Not on file   Tobacco Use   Smoking status: Former    Current packs/day: 0.00    Types: Cigarettes    Quit date: 27    Years since quitting: 39.1   Smokeless tobacco: Never  Vaping Use   Vaping status: Never Used  Substance and Sexual Activity   Alcohol use: Yes    Alcohol/week: 2.0 standard drinks of alcohol    Types: 2 Cans of beer per week   Drug use: No   Sexual activity: Not on file  Other Topics Concern   Not on file  Social History Narrative   Not on file   Social Drivers of Health   Financial Resource Strain: Low Risk  (04/11/2023)   Received from [facility]   Overall Financial Resource Strain (CARDIA)    Difficulty of Paying Living Expenses: Not hard at all  Food Insecurity: No Food Insecurity (04/11/2023)   Received from [facility]   Hunger Vital Sign    Worried About Running Out of Food in the Last Year: Never true    Ran Out of Food in the Last Year: Never true  Transportation Needs: No Transportation Needs (04/11/2023)   Received from [facility] - Transportation    Lack of Transportation (Medical): No    Lack of Transportation (Non-Medical): No  Physical Activity: Sufficiently Active (08/22/2022)   Received from [facility]   Exercise Vital Sign    Days of Exercise per Week: 3 days    Minutes of Exercise per Session: 50 min  Stress: No Stress Concern Present (07/28/2021)   Received from [facility], [facility] - Occupational Stress Questionnaire    Feeling of Stress : Not at all  Social Connections: Socially Integrated (08/22/2022)   [facility]   Social Network    How would you rate your social network (family, work, friends)?: Good participation with social networks  Intimate Partner Violence: Not At Risk (08/22/2022)   Received from [facility], [facility]   HITS    Over the last 12 months how often did your partner physically hurt you?: Never     Over the last 12 months how often did your partner insult you or talk down to you?: Never    Over the last 12 months how often did your partner threaten you with physical harm?: Never    Over the last 12 months how often did your partner scream or curse at you?: Never    FAMILY HISTORY: Family History  Problem Relation Age of Onset   Bowel Disease Mother        ishemic bowel   Lung cancer Mother        smoked   Cervical cancer Paternal Aunt  Cancer Maternal Grandfather        unknown type   Lung cancer Paternal Grandfather        smoked   Colon cancer Neg Hx    Breast cancer Neg Hx    Esophageal cancer Neg Hx    Pancreatic cancer Neg Hx    Prostate cancer Neg Hx    Rectal cancer Neg Hx    Stomach cancer Neg Hx    Allergic rhinitis Neg Hx    Asthma Neg Hx    Eczema Neg Hx    Urticaria Neg Hx    Colon polyps Neg Hx     Review of Systems  Constitutional:  Negative for appetite change, chills, fatigue, fever and unexpected weight change.  HENT:   Negative for hearing loss, lump/mass and trouble swallowing.   Eyes:  Negative for eye problems and icterus.  Respiratory:  Negative for chest tightness, cough and shortness of breath.   Cardiovascular:  Negative for chest pain, leg swelling and palpitations.  Gastrointestinal:  Negative for abdominal distention, abdominal pain, constipation, diarrhea, nausea and vomiting.  Endocrine: Negative for hot flashes.  Genitourinary:  Negative for difficulty urinating.   Musculoskeletal:  Negative for arthralgias.  Skin:  Negative for itching and rash.  Neurological:  Negative for dizziness, extremity weakness, headaches and numbness.  Hematological:  Negative for adenopathy. Does not bruise/bleed easily.  Psychiatric/Behavioral:  Negative for depression. The patient is not nervous/anxious.       PHYSICAL EXAMINATION    Vitals:   10/11/23 1055  BP: 124/72  Pulse: 78  Resp: 16  Temp: 97.9 F (36.6 C)  SpO2: 98%      Physical Exam Constitutional:      General: She is not in acute distress.    Appearance: Normal appearance. She is not toxic-appearing.  HENT:     Head: Normocephalic and atraumatic.     Mouth/Throat:     Mouth: Mucous membranes are moist.     Pharynx: Oropharynx is clear. No oropharyngeal exudate or posterior oropharyngeal erythema.  Eyes:     General: No scleral icterus. Cardiovascular:     Rate and Rhythm: Normal rate and regular rhythm.     Pulses: Normal pulses.     Heart sounds: Normal heart sounds.  Pulmonary:     Effort: Pulmonary effort is normal.     Breath sounds: Normal breath sounds.  Abdominal:     General: Abdomen is flat. Bowel sounds are normal. There is no distension.     Palpations: Abdomen is soft.     Tenderness: There is no abdominal tenderness.  Musculoskeletal:        General: No swelling.     Cervical back: Neck supple.  Lymphadenopathy:     Cervical: No cervical adenopathy.  Skin:    General: Skin is warm and dry.     Findings: No rash.  Neurological:     General: No focal deficit present.     Mental Status: She is alert.  Psychiatric:        Mood and Affect: Mood normal.        Behavior: Behavior normal.     LABORATORY DATA:  CBC    Component Value Date/Time   WBC 4.9 10/11/2023 1029   WBC 10.1 10/01/2021 1314   RBC 4.31 10/11/2023 1029   HGB 10.9 (L) 10/11/2023 1029   HGB 13.7 09/15/2020 1209   HCT 33.2 (L) 10/11/2023 1029   HCT 41.3 09/15/2020 1209   PLT 304 10/11/2023  1029   PLT 349 09/15/2020 1209   MCV 77.0 (L) 10/11/2023 1029   MCV 86 09/15/2020 1209   MCH 25.3 (L) 10/11/2023 1029   MCHC 32.8 10/11/2023 1029   RDW 14.9 10/11/2023 1029   RDW 12.3 09/15/2020 1209   LYMPHSABS 0.7 10/11/2023 1029   LYMPHSABS 1.2 09/15/2020 1209   MONOABS 0.8 10/11/2023 1029   EOSABS 0.2 10/11/2023 1029   EOSABS 0.2 09/15/2020 1209   BASOSABS 0.0 10/11/2023 1029   BASOSABS 0.1 09/15/2020 1209    CMP     Component Value Date/Time    NA 127 (L) 10/11/2023 1029   NA 135 09/15/2020 1209   K 3.9 10/11/2023 1029   CL 92 (L) 10/11/2023 1029   CO2 29 10/11/2023 1029   GLUCOSE 89 10/11/2023 1029   BUN 11 10/11/2023 1029   BUN 14 09/15/2020 1209   CREATININE 0.91 10/11/2023 1029   CALCIUM 9.2 10/11/2023 1029   PROT 6.8 10/11/2023 1029   PROT 6.8 09/15/2020 1209   ALBUMIN 4.1 10/11/2023 1029   ALBUMIN 4.6 09/15/2020 1209   AST 17 10/11/2023 1029   ALT 16 10/11/2023 1029   ALKPHOS 113 10/11/2023 1029   BILITOT 0.4 10/11/2023 1029   GFRNONAA >60 10/11/2023 1029   GFRAA 74 09/15/2020 1209      ASSESSMENT and THERAPY PLAN:   Malignant neoplasm of upper-outer quadrant of right breast in female, estrogen receptor positive (HCC) Breast Cancer Patient is nearing the end of her Herceptin treatment and is currently taking anastrozole with no reported issues. - Continue anastrozole as prescribed. - Follow-up visits every six months after the last treatment.  Balance Issues Patient reports feeling "wobbly" and has been referred to a physical therapy group for balance therapy by her ENT, Dr. Suszanne Conners - Proceed with balance therapy as recommended by ENT.  Fatigue/Weakness Patient reports feeling "wilted" and weak. She is currently working with a trainer twice a week focusing on leg and arm strength. - Encourage continued physical activity and self-motivation.  Possible Iron Deficiency Hemoglobin levels are slightly low. - Start over-the-counter iron supplement (65mg  every other day). - Check iron levels at next blood work appointment.  Dental Concerns Patient has a joint replacement and was previously advised to take an antibiotic before dental appointments. - No contraindications from oncology standpoint for dental work. Patient can proceed with dental appointments as planned.   All questions were answered. The patient knows to call the clinic with any problems, questions or concerns. We can certainly see the patient  much sooner if necessary.  Total encounter time:30 minutes*in face-to-face visit time, chart review, lab review, care coordination, order entry, and documentation of the encounter time.  *Total Encounter Time as defined by the Centers for Medicare and Medicaid Services includes, in addition to the face-to-face time of a patient visit (documented in the note above) non-face-to-face time: obtaining and reviewing outside history, ordering and reviewing medications, tests or procedures, care coordination (communications with other health care professionals or caregivers) and documentation in the medical record.

## 2023-10-11 NOTE — Research (Cosign Needed Addendum)
Z6109, ICE COMPRESS: RANDOMIZED TRIAL OF LIMB CRYOCOMPRESSION VERSUS CONTINUOUS COMPRESSION VERSUS LOW CYCLIC COMPRESSION FOR THE PREVENTION  OF TAXANE-INDUCED PERIPHERAL NEUROPATHY   Patient arrives today Accompanied by a friend  for the 52 Week visit. Confirmed patient does not have wounds, sores, or lesions to extremities.   PROs:  Per study protocol, all PROs required for this visit were completed prior to other study activities and completeness has been verified.     LABS: No study labs due today.   MD/PROVIDER VISIT: Patient sees Dr Al Pimple for today's visit.   ADVERSE EVENTS: Patient reports AE as below. Attribution per Dr Al Pimple.   Adverse Event CTCAE Grade Onset date Resolved date Relationship to Study Intervention Action Taken Comments  Skin atrophy (solicited)        Skin hyperpigmentation (solicited)        Skin hypopigmentation (solicited)        Skin induration (solicited)        Skin ulceration (solicited)        Rash maculopapular (solicited)        Nail changes (solicited)        Cold intolerance (solicited) (general disorders and administration site conditions- other)        Frostbite (solicited) (skin and subcutaneous tissue disorders- other)        Fatigue Grade 1 03/28/23  Unrelated    Generalized Muscle Weakness Grade 2 09/23/23  Unrelated  Per patient she is "wobbly" and has been sent to PT by her ENT; reports it is "not dizziness."    NEURO ASSESSMENT: All neuro assessments (Neuropen, Tuning Fork ,and Timed Get Up and Go) were completed by this nurse. Patient Judy Morrison tolerated all testing without complaint.    DISPOSITION: Upon completion off all study requirements, patient was escorted to the waiting room for infusion.   The patient was thanked for their time and continued voluntary participation in this study. Patient Judy Morrison has been provided direct contact information and is encouraged to contact this Nurse for any needs or  questions. This is her last assessment on this study.  Judy Chance Fatmata Legere, RN, BSN, [facility] She  Her  Hers Clinical Research Nurse [facility] Direct Dial [phone] 10/11/2023 2:12 PM

## 2023-10-11 NOTE — Research (Deleted)
Z6109, ICE COMPRESS: RANDOMIZED TRIAL OF LIMB CRYOCOMPRESSION VERSUS CONTINUOUS COMPRESSION VERSUS LOW CYCLIC COMPRESSION FOR THE PREVENTION  OF TAXANE-INDUCED PERIPHERAL NEUROPATHY   Patient arrives today Accompanied by a friend  for the 52 Week visit. Confirmed patient does not have wounds, sores, or lesions to extremities.   PROs:  Per study protocol, all PROs required for this visit were completed prior to other study activities and completeness has been verified.     LABS: No study labs due today.   MD/PROVIDER VISIT: Patient sees Dr Al Pimple for today's visit.   ADVERSE EVENTS: Patient reports AE as below. Attribution per Dr Al Pimple.   Adverse Event CTCAE Grade Onset date Resolved date Relationship to Study Intervention Action Taken Comments  Skin atrophy (solicited)        Skin hyperpigmentation (solicited)        Skin hypopigmentation (solicited)        Skin induration (solicited)        Skin ulceration (solicited)        Rash maculopapular (solicited)        Nail changes (solicited)        Cold intolerance (solicited) (general disorders and administration site conditions- other)        Frostbite (solicited) (skin and subcutaneous tissue disorders- other)        Fatigue Grade 1 03/28/23  Unrelated    Generalized Muscle Weakness Grade 2 09/23/23  Unrelated  Per patient she is "wobbly" and has been sent to PT by her ENT; reports it is "not dizziness."    NEURO ASSESSMENT: All neuro assessments (Neuropen, Tuning Fork ,and Timed Get Up and Go) were completed by this nurse. Patient Judy Morrison tolerated all testing without complaint.    DISPOSITION: Upon completion off all study requirements, patient was escorted to the waiting room for infusion.   The patient was thanked for their time and continued voluntary participation in this study. Patient Judy Morrison has been provided direct contact information and is encouraged to contact this Nurse for any needs or  questions. This is her last assessment on this study.  Judy Chance Fatmata Legere, RN, BSN, [facility] She  Her  Hers Clinical Research Nurse [facility] Direct Dial [phone] 10/11/2023 2:12 PM

## 2023-10-18 ENCOUNTER — Other Ambulatory Visit: Payer: Self-pay

## 2023-10-18 ENCOUNTER — Encounter: Payer: Self-pay | Admitting: Physical Therapy

## 2023-10-18 ENCOUNTER — Ambulatory Visit: Payer: Medicare Other | Attending: Otolaryngology | Admitting: Physical Therapy

## 2023-10-18 DIAGNOSIS — R42 Dizziness and giddiness: Secondary | ICD-10-CM | POA: Diagnosis present

## 2023-10-18 DIAGNOSIS — R2681 Unsteadiness on feet: Secondary | ICD-10-CM | POA: Insufficient documentation

## 2023-10-18 NOTE — Therapy (Signed)
 OUTPATIENT PHYSICAL THERAPY VESTIBULAR EVALUATION     Patient Name: Judy Morrison MRN: [id number] DOB:Apr 02, 1951, 73 y.o., female Today's Date: 10/18/2023  END OF SESSION:  PT End of Session - 10/18/23 1232     Visit Number 1    Number of Visits 12    Date for PT Re-Evaluation 11/25/23    Authorization Type Medicare/[employer]    PT Start Time 1235    PT Stop Time 1320    PT Time Calculation (min) 45 min    Equipment Utilized During Treatment Gait belt    Activity Tolerance Patient tolerated treatment well    Behavior During Therapy WFL for tasks assessed/performed             Past Medical History:  Diagnosis Date   Anemia    Anxiety    Arthritis    Breast cancer (HCC)    Depressed    Dermatitis    rare form, on prednisone    GERD (gastroesophageal reflux disease)    controlled with Omeprazole    Glaucoma    Personal history of chemotherapy    Personal history of radiation therapy    Torn rotator cuff    RIGHT    Urinary tract infection    dx 05-26-17 took 1 week cipro  BID completed 06-02-17.   Past Surgical History:  Procedure Laterality Date   ADENOIDECTOMY     APPENDECTOMY     arthroscopic right knee      BREAST BIOPSY Right 09/01/2022   MM RT BREAST BX W LOC DEV 1ST LESION IMAGE BX SPEC STEREO GUIDE 09/01/2022 [facility]   BREAST BIOPSY  10/01/2022   MM RT RADIOACTIVE SEED LOC MAMMO GUIDE 10/01/2022 [facility]   BREAST LUMPECTOMY     BREAST LUMPECTOMY WITH RADIOACTIVE SEED AND SENTINEL LYMPH NODE BIOPSY Right 10/05/2022   Procedure: RIGHT BREAST LUMPECTOMY WITH RADIOACTIVE SEED AND AXILLARY SENTINEL LYMPH NODE BIOPSY;  Surgeon: Ebbie Cough, MD;  Location: [facility];  Service: General;  Laterality: Right;   CHOLECYSTECTOMY     COLONOSCOPY  06/06/2017   Pyrtle   Pelvic sling     x2   POLYPECTOMY     PORT A CATH REVISION N/A 11/22/2022   Procedure: PORT A CATH REVISION;  Surgeon: Ebbie Cough, MD;   Location: [facility];  Service: General;  Laterality: N/A;   PORTACATH PLACEMENT Left 10/05/2022   Procedure: INSERTION PORT-A-CATH;  Surgeon: Ebbie Cough, MD;  Location: [facility];  Service: General;  Laterality: Left;   ROTATOR CUFF REPAIR Right    TONSILLECTOMY     TOTAL KNEE ARTHROPLASTY Right 08/12/2017   Procedure: RIGHT TOTAL KNEE ARTHROPLASTY;  Surgeon: Gerome Charleston, MD;  Location: [facility];  Service: Orthopedics;  Laterality: Right;   TOTAL KNEE ARTHROPLASTY Left 01/06/2018   Procedure: LEFT TOTAL KNEE ARTHROPLASTY;  Surgeon: Gerome Charleston, MD;  Location: [facility];  Service: Orthopedics;  Laterality: Left;   UPPER GASTROINTESTINAL ENDOSCOPY     WISDOM TOOTH EXTRACTION     Patient Active Problem List   Diagnosis Date Noted   Sensorineural hearing loss, bilateral 10/11/2023   Cognitive changes 05/02/2023   Port-A-Cath in place 11/15/2022   Genetic testing 10/04/2022   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   Malignant neoplasm of upper-outer quadrant of right breast in female, estrogen receptor positive (HCC) 09/10/2022   Rash and other nonspecific skin eruption 09/15/2020   Allergic contact dermatitis 09/15/2020   Pruritus 08/14/2020   Dizziness 05/10/2018  Atypical chest pain 05/10/2018   Primary osteoarthritis of left knee 01/06/2018   S/P knee replacement 08/12/2017   Incomplete tear of right rotator cuff 05/19/2017   Osteopenia 05/19/2017   Urge incontinence of urine 05/19/2017   Primary open angle glaucoma (POAG) 12/30/2016   Primary osteoarthritis of both knees 12/30/2016   Recurrent oral herpes simplex infection 12/30/2016   Tubular adenoma of colon 12/30/2016   Hypothyroidism 06/25/2016   Right foot pain 12/19/2014   Anxiety 05/21/2011   Seasonal allergies 05/21/2011    PCP: Medicine, [facility] REFERRING PROVIDER:   Karis Clunes, MD    REFERRING DIAG: R42 (ICD-10-CM) - Dizziness   THERAPY DIAG:  Dizziness and  giddiness  Unsteadiness on feet  ONSET DATE: 10/11/2023 (MD referral)  Rationale for Evaluation and Treatment: Rehabilitation  SUBJECTIVE:   SUBJECTIVE STATEMENT: Had some balance issues before the cancer diagnosis, and then spent all last year with cancer treatment.  The balance has gotten progressively worse.  The neurologist said I had neuropathy, but I don't have pain or numbness, just difficulty walking.  I have always enjoyed walking, but now I'm afraid to walk because afraid I may stumble.  Had chemo and radiation and now have infusion treatments.  Feel wobbly sometimes, not so much dizzy. Pt accompanied by: self  PERTINENT HISTORY: breast cancer, R and L TKA, R RTC surgery  PAIN:  Are you having pain? No  PRECAUTIONS: Fall  RED FLAGS: None   WEIGHT BEARING RESTRICTIONS: No  FALLS: Has patient fallen in last 6 months? Yes. Number of falls 2; one fall walking dog at night; one fall stepping onto elevator *Reports fear of stumbling*  LIVING ENVIRONMENT: Lives with: lives alone daughter lives nearby Lives in: House/apartment Stairs: Yes: External: flight of steps; on right going up and on left going up Has elevator Has following equipment at home: Single point cane and Walker - 2 wheeled  PLOF: Independent and Leisure: enjoyed walking dog (dog is now living with family)  PATIENT GOALS: To get balance better  OBJECTIVE:  Note: Objective measures were completed at Evaluation unless otherwise noted.  DIAGNOSTIC FINDINGS: WFL  COGNITION: Overall cognitive status: Within functional limits for tasks assessed   SENSATION: Light touch: WFL  POSTURE:  rounded shoulders   LOWER EXTREMITY MMT:   MMT Right eval Left eval  Hip flexion 4+ 4+  Hip abduction 4+ 4+  Hip adduction 4+ 4+  Hip internal rotation    Hip external rotation    Knee flexion 5 4+  Knee extension 5 5  Ankle dorsiflexion 4 4  Ankle plantarflexion    Ankle inversion    Ankle eversion     (Blank rows = not tested)   TRANSFERS: Assistive device utilized: None  Sit to stand: Complete Independence Stand to sit: Complete Independence  GAIT: Gait pattern: step through pattern, decreased arm swing- Right, and narrow BOS Distance walked: 50 ft Assistive device utilized: None Level of assistance: SBA Comments: guarded gait pattern, decreased timing of LLE swing through with gait.  FUNCTIONAL TESTS:  5 times sit to stand: 11.69 sec 10 meter walk test: NT Functional gait assessment: 18/30   [facility] PT Assessment - 10/18/23 1304       Functional Gait  Assessment   Gait assessed  Yes    Gait Level Surface Walks 20 ft, slow speed, abnormal gait pattern, evidence for imbalance or deviates 10-15 in outside of the 12 in walkway width. Requires more than 7 sec to  ambulate 20 ft.   7.22   Change in Gait Speed Able to change speed, demonstrates mild gait deviations, deviates 6-10 in outside of the 12 in walkway width, or no gait deviations, unable to achieve a major change in velocity, or uses a change in velocity, or uses an assistive device.    Gait with Horizontal Head Turns Performs head turns with moderate changes in gait velocity, slows down, deviates 10-15 in outside 12 in walkway width but recovers, can continue to walk.   >10 sec, weaving side to side   Gait with Vertical Head Turns Performs task with slight change in gait velocity (eg, minor disruption to smooth gait path), deviates 6 - 10 in outside 12 in walkway width or uses assistive device   8.75 sec   Gait and Pivot Turn Pivot turns safely within 3 sec and stops quickly with no loss of balance.    Step Over Obstacle Is able to step over one shoe box (4.5 in total height) without changing gait speed. No evidence of imbalance.    Gait with Narrow Base of Support Is able to ambulate for 10 steps heel to toe with no staggering.    Gait with Eyes Closed Walks 20 ft, slow speed, abnormal gait pattern, evidence for imbalance,  deviates 10-15 in outside 12 in walkway width. Requires more than 9 sec to ambulate 20 ft.    Ambulating Backwards Walks 20 ft, slow speed, abnormal gait pattern, evidence for imbalance, deviates 10-15 in outside 12 in walkway width.   27.71 sec   Steps Alternating feet, must use rail.    Total Score 18    FGA comment: Scores <22/30 indicates increased fall risk             M-CTSIB  Condition 1: Firm Surface, EO 30 Sec, Normal Sway  Condition 2: Firm Surface, EC 30 Sec, Moderate Sway  Condition 3: Foam Surface, EO 30 Sec, Mild Sway  Condition 4: Foam Surface, EC 6.29 Sec, Severe Sway    SLS:  10 sec RLE, 5.68 sec LLE Tandem stance:  30 seconds LL posterior; 19.47 sec RLE posterior PATIENT SURVEYS: (pt rates in regards to balance) DHI 42; (moderate handicap 36-52)  VESTIBULAR ASSESSMENT: (DID not proceed, as pt reports balance issues, not dizziness)                                                                                                                            TREATMENT DATE: 10/18/2023      PATIENT EDUCATION: Education details: PT eval results, POC, rationale for balance training/vestibular rehab based on decreased vestibular system use for balance; initiated HEP Person educated: Patient Education method: Explanation Education comprehension: verbalized understanding  HOME EXERCISE PROGRAM: Access Code: [email or web address] URL: [email or web address] Date: 10/18/2023 Prepared by: [facility] - Outpatient  Rehab - [facility]  Exercises - Narrow Stance with Counter Support  - 1 x daily - 7 x weekly - 1 sets - 3  reps - 30 sec hold - Single Leg Stance with Support  - 1 x daily - 7 x weekly - 1 sets - 3 reps - 10 sec hold  GOALS: Goals reviewed with patient? Yes  SHORT TERM GOALS: Target date: 2/142025  Pt will be independent with HEP for improved balance and gait. Baseline: Goal status: INITIAL  LONG TERM GOALS: Target date: 11/25/2023  Pt will be  independent with progression of HEP for improved balance and gait. Baseline:  Goal status: INITIAL  2.  Pt will improve FGA score to at least 22/30 to decrease fall risk. Baseline: 18/30 Goal status: INITIAL  3.  Pt will improve MCTSIB Condition 4 to 30 seconds mod sway or better, for improved balance. Baseline: 6 sec, severe sway Goal status: INITIAL  ASSESSMENT:  CLINICAL IMPRESSION: Patient is a 73 y.o. female who was seen today for physical therapy evaluation and treatment for dizziness, balance.   She reports that she is overall feeling more off balance and wobbly; she does not report generally having any dizziness sensations.  She does present with decreased balance, decreased strength, decreased timing and coordination of gait, decreased vestibular system use for balance.  She is at increased fall risk per FGA score of 18/30.  Prior to the last year spent focused on treatments for breast cancer, she was independent and walked up to 4 miles/day.  She will benefit from skilled PT to address the above stated deficits to decrease fall risk, improved overall gait and mobility.  OBJECTIVE IMPAIRMENTS: Abnormal gait, decreased balance, decreased mobility, difficulty walking, and decreased strength.   ACTIVITY LIMITATIONS: standing, stairs, locomotion level, and caring for others  PARTICIPATION LIMITATIONS: shopping, community activity, yard work, and walking the dog, community fitness  PERSONAL FACTORS: 3+ comorbidities: see above; pt also reports anxiety  are also affecting patient's functional outcome.   REHAB POTENTIAL: Good  CLINICAL DECISION MAKING: Evolving/moderate complexity  EVALUATION COMPLEXITY: Moderate   PLAN:  PT FREQUENCY: 2x/week  PT DURATION: 6 weeks  PLANNED INTERVENTIONS: 97110-Therapeutic exercises, 97530- Therapeutic activity, 97112- Neuromuscular re-education, 97535- Self Care, 02859- Manual therapy, [phone]- Gait training, Patient/Family education, Balance  training, Stair training, and Vestibular training  PLAN FOR NEXT SESSION: Review initial HEP and progress balance exercises-corner balance, hip/ankle strategy, SLS, tandem stance, forward/back walking, sidestepping, compliant surface balance   Arturo Freundlich W., PT 10/18/2023, 1:41 PM   [facility] [address], [address] [place], [place] [address] Phone # [phone] Fax # [phone]

## 2023-10-20 ENCOUNTER — Encounter: Payer: Self-pay | Admitting: Physical Therapy

## 2023-10-20 ENCOUNTER — Ambulatory Visit: Payer: Medicare Other | Admitting: Physical Therapy

## 2023-10-20 DIAGNOSIS — R2681 Unsteadiness on feet: Secondary | ICD-10-CM

## 2023-10-20 DIAGNOSIS — R42 Dizziness and giddiness: Secondary | ICD-10-CM

## 2023-10-20 NOTE — Therapy (Signed)
 OUTPATIENT PHYSICAL THERAPY VESTIBULAR TREATMENT NOTE     Patient Name: Judy Morrison MRN: [id number] DOB:08-14-1951, 73 y.o., female Today's Date: 10/20/2023  END OF SESSION:  PT End of Session - 10/20/23 1110     Visit Number 2    Number of Visits 12    Date for PT Re-Evaluation 11/25/23    Authorization Type Medicare/[employer]    PT Start Time 1106    PT Stop Time 1144    PT Time Calculation (min) 38 min    Equipment Utilized During Treatment Gait belt    Activity Tolerance Patient tolerated treatment well    Behavior During Therapy WFL for tasks assessed/performed             Past Medical History:  Diagnosis Date   Anemia    Anxiety    Arthritis    Breast cancer (HCC)    Depressed    Dermatitis    rare form, on prednisone    GERD (gastroesophageal reflux disease)    controlled with Omeprazole    Glaucoma    Personal history of chemotherapy    Personal history of radiation therapy    Torn rotator cuff    RIGHT    Urinary tract infection    dx 05-26-17 took 1 week cipro  BID completed 06-02-17.   Past Surgical History:  Procedure Laterality Date   ADENOIDECTOMY     APPENDECTOMY     arthroscopic right knee      BREAST BIOPSY Right 09/01/2022   MM RT BREAST BX W LOC DEV 1ST LESION IMAGE BX SPEC STEREO GUIDE 09/01/2022 [facility]   BREAST BIOPSY  10/01/2022   MM RT RADIOACTIVE SEED LOC MAMMO GUIDE 10/01/2022 [facility]   BREAST LUMPECTOMY     BREAST LUMPECTOMY WITH RADIOACTIVE SEED AND SENTINEL LYMPH NODE BIOPSY Right 10/05/2022   Procedure: RIGHT BREAST LUMPECTOMY WITH RADIOACTIVE SEED AND AXILLARY SENTINEL LYMPH NODE BIOPSY;  Surgeon: Ebbie Cough, MD;  Location: [facility];  Service: General;  Laterality: Right;   CHOLECYSTECTOMY     COLONOSCOPY  06/06/2017   Pyrtle   Pelvic sling     x2   POLYPECTOMY     PORT A CATH REVISION N/A 11/22/2022   Procedure: PORT A CATH REVISION;  Surgeon: Ebbie Cough,  MD;  Location: [facility] OR;  Service: General;  Laterality: N/A;   PORTACATH PLACEMENT Left 10/05/2022   Procedure: INSERTION PORT-A-CATH;  Surgeon: Ebbie Cough, MD;  Location: [facility];  Service: General;  Laterality: Left;   ROTATOR CUFF REPAIR Right    TONSILLECTOMY     TOTAL KNEE ARTHROPLASTY Right 08/12/2017   Procedure: RIGHT TOTAL KNEE ARTHROPLASTY;  Surgeon: Gerome Charleston, MD;  Location: [facility];  Service: Orthopedics;  Laterality: Right;   TOTAL KNEE ARTHROPLASTY Left 01/06/2018   Procedure: LEFT TOTAL KNEE ARTHROPLASTY;  Surgeon: Gerome Charleston, MD;  Location: [facility];  Service: Orthopedics;  Laterality: Left;   UPPER GASTROINTESTINAL ENDOSCOPY     WISDOM TOOTH EXTRACTION     Patient Active Problem List   Diagnosis Date Noted   Sensorineural hearing loss, bilateral 10/11/2023   Cognitive changes 05/02/2023   Port-A-Cath in place 11/15/2022   Genetic testing 10/04/2022   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   Malignant neoplasm of upper-outer quadrant of right breast in female, estrogen receptor positive (HCC) 09/10/2022   Rash and other nonspecific skin eruption 09/15/2020   Allergic contact dermatitis 09/15/2020   Pruritus 08/14/2020   Dizziness 05/10/2018  Atypical chest pain 05/10/2018   Primary osteoarthritis of left knee 01/06/2018   S/P knee replacement 08/12/2017   Incomplete tear of right rotator cuff 05/19/2017   Osteopenia 05/19/2017   Urge incontinence of urine 05/19/2017   Primary open angle glaucoma (POAG) 12/30/2016   Primary osteoarthritis of both knees 12/30/2016   Recurrent oral herpes simplex infection 12/30/2016   Tubular adenoma of colon 12/30/2016   Hypothyroidism 06/25/2016   Right foot pain 12/19/2014   Anxiety 05/21/2011   Seasonal allergies 05/21/2011    PCP: Medicine, [facility] REFERRING PROVIDER:   Karis Clunes, MD    REFERRING DIAG: R42 (ICD-10-CM) - Dizziness   THERAPY DIAG:  Dizziness  and giddiness  Unsteadiness on feet  ONSET DATE: 10/11/2023 (MD referral)  Rationale for Evaluation and Treatment: Rehabilitation  SUBJECTIVE:   SUBJECTIVE STATEMENT: Have been doing my exercises, multiple times per day. Pt accompanied by: self  PERTINENT HISTORY: breast cancer, R and L TKA, R RTC surgery  PAIN:  Are you having pain? No  PRECAUTIONS: Fall  RED FLAGS: None   WEIGHT BEARING RESTRICTIONS: No  FALLS: Has patient fallen in last 6 months? Yes. Number of falls 2; one fall walking dog at night; one fall stepping onto elevator *Reports fear of stumbling*  LIVING ENVIRONMENT: Lives with: lives alone daughter lives nearby Lives in: House/apartment Stairs: Yes: External: flight of steps; on right going up and on left going up Has elevator Has following equipment at home: Single point cane and Walker - 2 wheeled  PLOF: Independent and Leisure: enjoyed walking dog (dog is now living with family)  PATIENT GOALS: To get balance better  OBJECTIVE:    TODAY'S TREATMENT: 10/20/2023 Activity Comments  Reviewed HEP Good return demo  Corner balance: Feet together EO/EC head turns/nods x 5  Mild sway  Heel/toe raises 10 reps   Wall bumps x 10   On Airex: Feet apart EO/EC head turns/nods Does report increased difficulty looking down  Forward/back walking along counter, 5 reps Cues for wider BOS, RLE appears somewhat guarded  Sidestepping R and L along counter Cues for foot clearance         Access Code: [email or web address] URL: [email or web address] Date: 10/20/2023 Prepared by: [facility] - Outpatient  Rehab - [facility]  Exercises - Narrow Stance with Counter Support  - 1 x daily - 7 x weekly - 1 sets - 3 reps - 30 sec hold - Single Leg Stance with Support  - 1 x daily - 7 x weekly - 1 sets - 3 reps - 10 sec hold - Heel Toe Raises with Counter Support  - 1 x daily - 7 x weekly - 3 sets - 10 reps - Standing on Foam Pad  - 1 x daily - 7 x weekly - 1  sets - 5 reps - Standing Balance with Eyes Closed on Foam  - 1 x daily - 7 x weekly - 1 sets - 5 reps  PATIENT EDUCATION: Education details: HEP updates Person educated: Patient Education method: Explanation, Demonstration, and Handouts Education comprehension: verbalized understanding and returned demonstration  ---------------------------------------------- Note: Objective measures were completed at Evaluation unless otherwise noted.  DIAGNOSTIC FINDINGS: WFL  COGNITION: Overall cognitive status: Within functional limits for tasks assessed   SENSATION: Light touch: WFL  POSTURE:  rounded shoulders   LOWER EXTREMITY MMT:   MMT Right eval Left eval  Hip flexion 4+ 4+  Hip abduction 4+ 4+  Hip adduction 4+ 4+  Hip internal  rotation    Hip external rotation    Knee flexion 5 4+  Knee extension 5 5  Ankle dorsiflexion 4 4  Ankle plantarflexion    Ankle inversion    Ankle eversion    (Blank rows = not tested)   TRANSFERS: Assistive device utilized: None  Sit to stand: Complete Independence Stand to sit: Complete Independence  GAIT: Gait pattern: step through pattern, decreased arm swing- Right, and narrow BOS Distance walked: 50 ft Assistive device utilized: None Level of assistance: SBA Comments: guarded gait pattern, decreased timing of LLE swing through with gait.  FUNCTIONAL TESTS:  5 times sit to stand: 11.69 sec 10 meter walk test: NT Functional gait assessment: 18/30     M-CTSIB  Condition 1: Firm Surface, EO 30 Sec, Normal Sway  Condition 2: Firm Surface, EC 30 Sec, Moderate Sway  Condition 3: Foam Surface, EO 30 Sec, Mild Sway  Condition 4: Foam Surface, EC 6.29 Sec, Severe Sway    SLS:  10 sec RLE, 5.68 sec LLE Tandem stance:  30 seconds LL posterior; 19.47 sec RLE posterior PATIENT SURVEYS: (pt rates in regards to balance) DHI 42; (moderate handicap 36-52)  VESTIBULAR ASSESSMENT: (DID not proceed, as pt reports balance issues, not  dizziness)                                                                                                                            TREATMENT DATE: 10/18/2023      PATIENT EDUCATION: Education details: PT eval results, POC, rationale for balance training/vestibular rehab based on decreased vestibular system use for balance; initiated HEP Person educated: Patient Education method: Explanation Education comprehension: verbalized understanding  HOME EXERCISE PROGRAM: Access Code: [email or web address] URL: [email or web address] Date: 10/18/2023 Prepared by: [facility] - Outpatient  Rehab - [facility]  Exercises - Narrow Stance with Counter Support  - 1 x daily - 7 x weekly - 1 sets - 3 reps - 30 sec hold - Single Leg Stance with Support  - 1 x daily - 7 x weekly - 1 sets - 3 reps - 10 sec hold  GOALS: Goals reviewed with patient? Yes  SHORT TERM GOALS: Target date: 2/142025  Pt will be independent with HEP for improved balance and gait. Baseline: Goal status: INITIAL  LONG TERM GOALS: Target date: 11/25/2023  Pt will be independent with progression of HEP for improved balance and gait. Baseline:  Goal status: INITIAL  2.  Pt will improve FGA score to at least 22/30 to decrease fall risk. Baseline: 18/30 Goal status: INITIAL  3.  Pt will improve MCTSIB Condition 4 to 30 seconds mod sway or better, for improved balance. Baseline: 6 sec, severe sway Goal status: INITIAL  ASSESSMENT:  CLINICAL IMPRESSION: Pt presents today with no new complaints. Skilled PT session focused on review and progression of balance exercises.  Introduced compliant surface exercises and ankle/hip strategy work, adding to [employer].  Also worked on editor, commissioning activities at  counter, with pt needing cues for wider BOS in posterior direction, foot clearance with sidestepping.  She seems pleased with progression of HEP.  Pt will continue to benefit from skilled PT towards goals for improved  functional mobility and decreased fall risk.   OBJECTIVE IMPAIRMENTS: Abnormal gait, decreased balance, decreased mobility, difficulty walking, and decreased strength.   ACTIVITY LIMITATIONS: standing, stairs, locomotion level, and caring for others  PARTICIPATION LIMITATIONS: shopping, community activity, yard work, and walking the dog, community fitness  PERSONAL FACTORS: 3+ comorbidities: see above; pt also reports anxiety  are also affecting patient's functional outcome.   REHAB POTENTIAL: Good  CLINICAL DECISION MAKING: Evolving/moderate complexity  EVALUATION COMPLEXITY: Moderate   PLAN:  PT FREQUENCY: 2x/week  PT DURATION: 6 weeks  PLANNED INTERVENTIONS: 97110-Therapeutic exercises, 97530- Therapeutic activity, 97112- Neuromuscular re-education, 97535- Self Care, [id number]- Manual therapy, [phone]- Gait training, Patient/Family education, Balance training, Stair training, and Vestibular training  PLAN FOR NEXT SESSION: Review progression of HEP and continue to progress balance exercises-corner balance, hip/ankle strategy, SLS, tandem stance, forward/back walking, sidestepping, compliant surface balance   Judy Waterbury W., PT 10/20/2023, 11:44 AM   [facility] at [facility] [address], [address] [place], [place] [address] Phone # [phone] Fax # [phone]

## 2023-10-24 ENCOUNTER — Ambulatory Visit: Payer: Medicare Other | Admitting: Physical Therapy

## 2023-10-27 ENCOUNTER — Ambulatory Visit: Payer: Medicare Other

## 2023-10-27 DIAGNOSIS — R2681 Unsteadiness on feet: Secondary | ICD-10-CM

## 2023-10-27 DIAGNOSIS — R42 Dizziness and giddiness: Secondary | ICD-10-CM

## 2023-10-27 NOTE — Therapy (Signed)
OUTPATIENT PHYSICAL THERAPY VESTIBULAR TREATMENT NOTE     Patient Name: Judy Morrison MRN: [id number] DOB:11/22/1950, 73 y.o., female Today's Date: 10/27/2023  END OF SESSION:  PT End of Session - 10/27/23 1403     Visit Number 3    Number of Visits 12    Date for PT Re-Evaluation 11/25/23    Authorization Type Medicare/[employer]    PT Start Time 1402    PT Stop Time 1445    PT Time Calculation (min) 43 min    Equipment Utilized During Treatment Gait belt    Activity Tolerance Patient tolerated treatment well    Behavior During Therapy WFL for tasks assessed/performed             Past Medical History:  Diagnosis Date   Anemia    Anxiety    Arthritis    Breast cancer (HCC)    Depressed    Dermatitis    rare form, on prednisone   GERD (gastroesophageal reflux disease)    controlled with Omeprazole   Glaucoma    Personal history of chemotherapy    Personal history of radiation therapy    Torn rotator cuff    RIGHT    Urinary tract infection    dx 05-26-17 took 1 week cipro BID completed 06-02-17.   Past Surgical History:  Procedure Laterality Date   ADENOIDECTOMY     APPENDECTOMY     arthroscopic right knee      BREAST BIOPSY Right 09/01/2022   MM RT BREAST BX W LOC DEV 1ST LESION IMAGE BX SPEC STEREO GUIDE 09/01/2022 [facility]   BREAST BIOPSY  10/01/2022   MM RT RADIOACTIVE SEED LOC MAMMO GUIDE 10/01/2022 [facility]   BREAST LUMPECTOMY     BREAST LUMPECTOMY WITH RADIOACTIVE SEED AND SENTINEL LYMPH NODE BIOPSY Right 10/05/2022   Procedure: RIGHT BREAST LUMPECTOMY WITH RADIOACTIVE SEED AND AXILLARY SENTINEL LYMPH NODE BIOPSY;  Surgeon: [name], MD;  Location: [facility];  Service: General;  Laterality: Right;   CHOLECYSTECTOMY     COLONOSCOPY  06/06/2017   Pyrtle   Pelvic sling     x2   POLYPECTOMY     PORT A CATH REVISION N/A 11/22/2022   Procedure: PORT A CATH REVISION;  Surgeon: [name],  MD;  Location: [facility] OR;  Service: General;  Laterality: N/A;   PORTACATH PLACEMENT Left 10/05/2022   Procedure: INSERTION PORT-A-CATH;  Surgeon: [name], MD;  Location: [facility];  Service: General;  Laterality: Left;   ROTATOR CUFF REPAIR Right    TONSILLECTOMY     TOTAL KNEE ARTHROPLASTY Right 08/12/2017   Procedure: RIGHT TOTAL KNEE ARTHROPLASTY;  Surgeon: Eugenia Mcalpine, MD;  Location: [facility];  Service: Orthopedics;  Laterality: Right;   TOTAL KNEE ARTHROPLASTY Left 01/06/2018   Procedure: LEFT TOTAL KNEE ARTHROPLASTY;  Surgeon: Eugenia Mcalpine, MD;  Location: [facility];  Service: Orthopedics;  Laterality: Left;   UPPER GASTROINTESTINAL ENDOSCOPY     WISDOM TOOTH EXTRACTION     Patient Active Problem List   Diagnosis Date Noted   Sensorineural hearing loss, bilateral 10/11/2023   Cognitive changes 05/02/2023   Port-A-Cath in place 11/15/2022   Genetic testing 10/04/2022   Monoallelic mutation of ATM gene 40/98/1191   Malignant neoplasm of upper-outer quadrant of right breast in female, estrogen receptor positive (HCC) 09/10/2022   Rash and other nonspecific skin eruption 09/15/2020   Allergic contact dermatitis 09/15/2020   Pruritus 08/14/2020   Dizziness 05/10/2018  Atypical chest pain 05/10/2018   Primary osteoarthritis of left knee 01/06/2018   S/P knee replacement 08/12/2017   Incomplete tear of right rotator cuff 05/19/2017   Osteopenia 05/19/2017   Urge incontinence of urine 05/19/2017   Primary open angle glaucoma (POAG) 12/30/2016   Primary osteoarthritis of both knees 12/30/2016   Recurrent oral herpes simplex infection 12/30/2016   Tubular adenoma of colon 12/30/2016   Hypothyroidism 06/25/2016   Right foot pain 12/19/2014   Anxiety 05/21/2011   Seasonal allergies 05/21/2011    PCP: Medicine, [facility] REFERRING PROVIDER:   Newman Pies, MD    REFERRING DIAG: R42 (ICD-10-CM) - Dizziness   THERAPY DIAG:  Dizziness  and giddiness  Unsteadiness on feet  ONSET DATE: 10/11/2023 (MD referral)  Rationale for Evaluation and Treatment: Rehabilitation  SUBJECTIVE:   SUBJECTIVE STATEMENT: Did Pt accompanied by: self  PERTINENT HISTORY: breast cancer, R and L TKA, R RTC surgery  PAIN:  Are you having pain? No  PRECAUTIONS: Fall  RED FLAGS: None   WEIGHT BEARING RESTRICTIONS: No  FALLS: Has patient fallen in last 6 months? Yes. Number of falls 2; one fall walking dog at night; one fall stepping onto elevator *Reports fear of stumbling*  LIVING ENVIRONMENT: Lives with: lives alone daughter lives nearby Lives in: House/apartment Stairs: Yes: External: flight of steps; on right going up and on left going up Has elevator Has following equipment at home: Single point cane and Walker - 2 wheeled  PLOF: Independent and Leisure: enjoyed walking dog (dog is now living with family)  PATIENT GOALS: To get balance better  OBJECTIVE:   TODAY'S TREATMENT: 10/27/23 Activity Comments  Retrowalking 4x 25 ft Right trendelenberg   Single leg stance   Sidestepping w/ blue loop Cues for maintaining tension to hip abd  Sit to stand 3x5 W/ blue loop + airex pad  Multisensory balance activities          TODAY'S TREATMENT: 10/20/2023 Activity Comments  Reviewed HEP Good return demo  Corner balance: Feet together EO/EC head turns/nods x 5  Mild sway  Heel/toe raises 10 reps   Wall bumps x 10   On Airex: Feet apart EO/EC head turns/nods Does report increased difficulty looking down  Forward/back walking along counter, 5 reps Cues for wider BOS, RLE appears somewhat guarded  Sidestepping R and L along counter Cues for foot clearance         Access Code: [email or web address] URL: [email or web address] Date: 10/20/2023 Prepared by: [facility] - Outpatient  Rehab - [facility]  Exercises - Narrow Stance with Counter Support  - 1 x daily - 7 x weekly - 1 sets - 3 reps - 30 sec hold - Single Leg  Stance with Support  - 1 x daily - 7 x weekly - 1 sets - 3 reps - 10 sec hold - Heel Toe Raises with Counter Support  - 1 x daily - 7 x weekly - 3 sets - 10 reps - Standing on Foam Pad  - 1 x daily - 7 x weekly - 1 sets - 5 reps - Standing Balance with Eyes Closed on Foam  - 1 x daily - 7 x weekly - 1 sets - 5 reps - Side Stepping with Resistance at Thighs and Counter Support  - 1 x daily - 7 x weekly - 3 sets - 1-2 min rounds hold - Sit to Stand on Foam Pad  - 1 x daily - 7 x weekly - 3 sets -  10 reps PATIENT EDUCATION: Education details: HEP updates Person educated: Patient Education method: Explanation, Demonstration, and Handouts Education comprehension: verbalized understanding and returned demonstration  ---------------------------------------------- Note: Objective measures were completed at Evaluation unless otherwise noted.  DIAGNOSTIC FINDINGS: WFL  COGNITION: Overall cognitive status: Within functional limits for tasks assessed   SENSATION: Light touch: WFL  POSTURE:  rounded shoulders   LOWER EXTREMITY MMT:   MMT Right eval Left eval  Hip flexion 4+ 4+  Hip abduction 4+ 4+  Hip adduction 4+ 4+  Hip internal rotation    Hip external rotation    Knee flexion 5 4+  Knee extension 5 5  Ankle dorsiflexion 4 4  Ankle plantarflexion    Ankle inversion    Ankle eversion    (Blank rows = not tested)   TRANSFERS: Assistive device utilized: None  Sit to stand: Complete Independence Stand to sit: Complete Independence  GAIT: Gait pattern: step through pattern, decreased arm swing- Right, and narrow BOS Distance walked: 50 ft Assistive device utilized: None Level of assistance: SBA Comments: guarded gait pattern, decreased timing of LLE swing through with gait.  FUNCTIONAL TESTS:  5 times sit to stand: 11.69 sec 10 meter walk test: NT Functional gait assessment: 18/30     M-CTSIB  Condition 1: Firm Surface, EO 30 Sec, Normal Sway  Condition 2: Firm  Surface, EC 30 Sec, Moderate Sway  Condition 3: Foam Surface, EO 30 Sec, Mild Sway  Condition 4: Foam Surface, EC 6.29 Sec, Severe Sway    SLS:  10 sec RLE, 5.68 sec LLE Tandem stance:  30 seconds LL posterior; 19.47 sec RLE posterior PATIENT SURVEYS: (pt rates in regards to balance) DHI 42; (moderate handicap 36-52)  VESTIBULAR ASSESSMENT: (DID not proceed, as pt reports balance issues, not dizziness)                                                                                                                            TREATMENT DATE: 10/18/2023      PATIENT EDUCATION: Education details: PT eval results, POC, rationale for balance training/vestibular rehab based on decreased vestibular system use for balance; initiated HEP Person educated: Patient Education method: Explanation Education comprehension: verbalized understanding  HOME EXERCISE PROGRAM: Access Code: [email or web address] URL: [email or web address] Date: 10/18/2023 Prepared by: [facility] - Outpatient  Rehab - [facility]  Exercises - Narrow Stance with Counter Support  - 1 x daily - 7 x weekly - 1 sets - 3 reps - 30 sec hold - Single Leg Stance with Support  - 1 x daily - 7 x weekly - 1 sets - 3 reps - 10 sec hold  GOALS: Goals reviewed with patient? Yes  SHORT TERM GOALS: Target date: 2/142025  Pt will be independent with HEP for improved balance and gait. Baseline: Goal status: INITIAL  LONG TERM GOALS: Target date: 11/25/2023  Pt will be independent with progression of HEP for improved balance and gait. Baseline:  Goal status:  INITIAL  2.  Pt will improve FGA score to at least 22/30 to decrease fall risk. Baseline: 18/30 Goal status: INITIAL  3.  Pt will improve MCTSIB Condition 4 to 30 seconds mod sway or better, for improved balance. Baseline: 6 sec, severe sway Goal status: INITIAL  ASSESSMENT:  CLINICAL IMPRESSION: Reports ongoing general unsteadiness.  Exhibits right Trendelenberg  in single limb stance demands. Developed activities/exercises to encourage right hip abduction recruitment for improved stability in stance phase. Difficulty with multisnesory balance demands especially with eyes closed/narrow base demands.  Continued sessions to progress POC details to improve balance and reduce riskf rof alls  OBJECTIVE IMPAIRMENTS: Abnormal gait, decreased balance, decreased mobility, difficulty walking, and decreased strength.   ACTIVITY LIMITATIONS: standing, stairs, locomotion level, and caring for others  PARTICIPATION LIMITATIONS: shopping, community activity, yard work, and walking the dog, community fitness  PERSONAL FACTORS: 3+ comorbidities: see above; pt also reports anxiety  are also affecting patient's functional outcome.   REHAB POTENTIAL: Good  CLINICAL DECISION MAKING: Evolving/moderate complexity  EVALUATION COMPLEXITY: Moderate   PLAN:  PT FREQUENCY: 2x/week  PT DURATION: 6 weeks  PLANNED INTERVENTIONS: 97110-Therapeutic exercises, 97530- Therapeutic activity, 97112- Neuromuscular re-education, 97535- Self Care, [id number]- Manual therapy, [phone]- Gait training, Patient/Family education, Balance training, Stair training, and Vestibular training  PLAN FOR NEXT SESSION: Review progression of HEP and continue to progress balance exercises-corner balance, hip/ankle strategy, SLS, tandem stance, forward/back walking, sidestepping, compliant surface balance   Dion Body, PT 10/27/2023, 2:04 PM   [facility] at [facility] [address] [place], [place] [address] Phone # [phone] Fax # [phone]

## 2023-10-31 ENCOUNTER — Encounter: Payer: Self-pay | Admitting: *Deleted

## 2023-10-31 ENCOUNTER — Ambulatory Visit: Payer: Medicare Other | Admitting: Physical Therapy

## 2023-10-31 DIAGNOSIS — R42 Dizziness and giddiness: Secondary | ICD-10-CM

## 2023-10-31 DIAGNOSIS — R2681 Unsteadiness on feet: Secondary | ICD-10-CM

## 2023-10-31 DIAGNOSIS — Z170 Estrogen receptor positive status [ER+]: Secondary | ICD-10-CM

## 2023-10-31 NOTE — Therapy (Signed)
OUTPATIENT PHYSICAL THERAPY VESTIBULAR TREATMENT NOTE     Patient Name: Judy Morrison MRN: [id number] DOB:11/25/1950, 73 y.o., female Today's Date: 10/31/2023  END OF SESSION:  PT End of Session - 10/31/23 1526     Visit Number 4    Number of Visits 12    Date for PT Re-Evaluation 11/25/23    Authorization Type Medicare/[employer]    PT Start Time 1448    PT Stop Time 1526    PT Time Calculation (min) 38 min    Equipment Utilized During Treatment Gait belt    Activity Tolerance Patient tolerated treatment well    Behavior During Therapy WFL for tasks assessed/performed              Past Medical History:  Diagnosis Date   Anemia    Anxiety    Arthritis    Breast cancer (HCC)    Depressed    Dermatitis    rare form, on prednisone   GERD (gastroesophageal reflux disease)    controlled with Omeprazole   Glaucoma    Personal history of chemotherapy    Personal history of radiation therapy    Torn rotator cuff    RIGHT    Urinary tract infection    dx 05-26-17 took 1 week cipro BID completed 06-02-17.   Past Surgical History:  Procedure Laterality Date   ADENOIDECTOMY     APPENDECTOMY     arthroscopic right knee      BREAST BIOPSY Right 09/01/2022   MM RT BREAST BX W LOC DEV 1ST LESION IMAGE BX SPEC STEREO GUIDE 09/01/2022 [facility]   BREAST BIOPSY  10/01/2022   MM RT RADIOACTIVE SEED LOC MAMMO GUIDE 10/01/2022 [facility]   BREAST LUMPECTOMY     BREAST LUMPECTOMY WITH RADIOACTIVE SEED AND SENTINEL LYMPH NODE BIOPSY Right 10/05/2022   Procedure: RIGHT BREAST LUMPECTOMY WITH RADIOACTIVE SEED AND AXILLARY SENTINEL LYMPH NODE BIOPSY;  Surgeon: [name], MD;  Location: [facility];  Service: General;  Laterality: Right;   CHOLECYSTECTOMY     COLONOSCOPY  06/06/2017   Pyrtle   Pelvic sling     x2   POLYPECTOMY     PORT A CATH REVISION N/A 11/22/2022   Procedure: PORT A CATH REVISION;  Surgeon: [name],  MD;  Location: [facility] OR;  Service: General;  Laterality: N/A;   PORTACATH PLACEMENT Left 10/05/2022   Procedure: INSERTION PORT-A-CATH;  Surgeon: [name], MD;  Location: [facility];  Service: General;  Laterality: Left;   ROTATOR CUFF REPAIR Right    TONSILLECTOMY     TOTAL KNEE ARTHROPLASTY Right 08/12/2017   Procedure: RIGHT TOTAL KNEE ARTHROPLASTY;  Surgeon: Eugenia Mcalpine, MD;  Location: [facility];  Service: Orthopedics;  Laterality: Right;   TOTAL KNEE ARTHROPLASTY Left 01/06/2018   Procedure: LEFT TOTAL KNEE ARTHROPLASTY;  Surgeon: Eugenia Mcalpine, MD;  Location: [facility];  Service: Orthopedics;  Laterality: Left;   UPPER GASTROINTESTINAL ENDOSCOPY     WISDOM TOOTH EXTRACTION     Patient Active Problem List   Diagnosis Date Noted   Sensorineural hearing loss, bilateral 10/11/2023   Cognitive changes 05/02/2023   Port-A-Cath in place 11/15/2022   Genetic testing 10/04/2022   Monoallelic mutation of ATM gene 40/98/1191   Malignant neoplasm of upper-outer quadrant of right breast in female, estrogen receptor positive (HCC) 09/10/2022   Rash and other nonspecific skin eruption 09/15/2020   Allergic contact dermatitis 09/15/2020   Pruritus 08/14/2020   Dizziness 05/10/2018  Atypical chest pain 05/10/2018   Primary osteoarthritis of left knee 01/06/2018   S/P knee replacement 08/12/2017   Incomplete tear of right rotator cuff 05/19/2017   Osteopenia 05/19/2017   Urge incontinence of urine 05/19/2017   Primary open angle glaucoma (POAG) 12/30/2016   Primary osteoarthritis of both knees 12/30/2016   Recurrent oral herpes simplex infection 12/30/2016   Tubular adenoma of colon 12/30/2016   Hypothyroidism 06/25/2016   Right foot pain 12/19/2014   Anxiety 05/21/2011   Seasonal allergies 05/21/2011    PCP: [facility], [facility] REFERRING PROVIDER:   Newman Pies, MD    REFERRING DIAG: R42 (ICD-10-CM) - Dizziness   THERAPY DIAG:  Dizziness  and giddiness  Unsteadiness on feet  ONSET DATE: 10/11/2023 (MD referral)  Rationale for Evaluation and Treatment: Rehabilitation  SUBJECTIVE:   SUBJECTIVE STATEMENT: Really off balance today for some reason. Brought in her HEP- reports SLS is still tough.    Pt accompanied by: self  PERTINENT HISTORY: breast cancer, R and L TKA, R RTC surgery  PAIN:  Are you having pain? No   PRECAUTIONS: Fall  RED FLAGS: None   WEIGHT BEARING RESTRICTIONS: No  FALLS: Has patient fallen in last 6 months? Yes. Number of falls 2; one fall walking dog at night; one fall stepping onto elevator *Reports fear of stumbling*  LIVING ENVIRONMENT: Lives with: lives alone daughter lives nearby Lives in: House/apartment Stairs: Yes: External: flight of steps; on right going up and on left going up Has elevator Has following equipment at home: Single point cane and Walker - 2 wheeled  PLOF: Independent and Leisure: enjoyed walking dog (dog is now living with family)  PATIENT GOALS: To get balance better  OBJECTIVE:     TODAY'S TREATMENT: 10/31/23 Activity Comments  toe tap to 1 cone (no UE support) toe tap to 2 cones (2 fingertip support) romberg on foam  30" standing on foam EC 2x30" Sidestepping onto/off foam   Performed in II bars. Cueing provided for wt shifting, core engagement, reducing speed, safe foot placement.  Toe tap to 1st, 2nd step, down 10x each  Required 0-1 UE support; CGA; R hip instability   step ups 6" holding ball in B hands 10x each  R hip instability   Gait + head turns/nods  Mod instability, requiring CGA-min A           HOME EXERCISE PROGRAM Last updated: 10/31/23 Access Code: [email or web address] URL: [email or web address] Date: 10/31/2023 Prepared by: [facility] - Outpatient  Rehab - [facility]  Exercises - Narrow Stance with Counter Support  - 1 x daily - 7 x weekly - 1 sets - 3 reps - 30 sec hold - Heel Toe Raises with Counter Support  - 1 x  daily - 7 x weekly - 3 sets - 10 reps - Standing on Foam Pad  - 1 x daily - 7 x weekly - 1 sets - 5 reps - Standing Balance with Eyes Closed on Foam  - 1 x daily - 7 x weekly - 1 sets - 5 reps - Side Stepping with Resistance at Thighs and Counter Support  - 1 x daily - 7 x weekly - 3 sets - 1-2 min rounds hold - Sit to Stand on Foam Pad  - 1 x daily - 7 x weekly - 3 sets - 10 reps - Standing Toe Taps  - 1 x daily - 5 x weekly - 2 sets - 10 reps    PATIENT  EDUCATION: Education details: HEP update with edu for safety Person educated: Patient Education method: Explanation, Demonstration, Tactile cues, Verbal cues, and Handouts Education comprehension: verbalized understanding and returned demonstration     ---------------------------------------------- Note: Objective measures were completed at Evaluation unless otherwise noted.  DIAGNOSTIC FINDINGS: WFL  COGNITION: Overall cognitive status: Within functional limits for tasks assessed   SENSATION: Light touch: WFL  POSTURE:  rounded shoulders   LOWER EXTREMITY MMT:   MMT Right eval Left eval  Hip flexion 4+ 4+  Hip abduction 4+ 4+  Hip adduction 4+ 4+  Hip internal rotation    Hip external rotation    Knee flexion 5 4+  Knee extension 5 5  Ankle dorsiflexion 4 4  Ankle plantarflexion    Ankle inversion    Ankle eversion    (Blank rows = not tested)   TRANSFERS: Assistive device utilized: None  Sit to stand: Complete Independence Stand to sit: Complete Independence  GAIT: Gait pattern: step through pattern, decreased arm swing- Right, and narrow BOS Distance walked: 50 ft Assistive device utilized: None Level of assistance: SBA Comments: guarded gait pattern, decreased timing of LLE swing through with gait.  FUNCTIONAL TESTS:  5 times sit to stand: 11.69 sec 10 meter walk test: NT Functional gait assessment: 18/30     M-CTSIB  Condition 1: Firm Surface, EO 30 Sec, Normal Sway  Condition 2: Firm  Surface, EC 30 Sec, Moderate Sway  Condition 3: Foam Surface, EO 30 Sec, Mild Sway  Condition 4: Foam Surface, EC 6.29 Sec, Severe Sway    SLS:  10 sec RLE, 5.68 sec LLE Tandem stance:  30 seconds LL posterior; 19.47 sec RLE posterior PATIENT SURVEYS: (pt rates in regards to balance) DHI 42; (moderate handicap 36-52)  VESTIBULAR ASSESSMENT: (DID not proceed, as pt reports balance issues, not dizziness)                                                                                                                            TREATMENT DATE: 10/18/2023      PATIENT EDUCATION: Education details: PT eval results, POC, rationale for balance training/vestibular rehab based on decreased vestibular system use for balance; initiated HEP Person educated: Patient Education method: Explanation Education comprehension: verbalized understanding  HOME EXERCISE PROGRAM: Access Code: [email or web address] URL: [email or web address] Date: 10/18/2023 Prepared by: [facility] - Outpatient  Rehab - [facility]  Exercises - Narrow Stance with Counter Support  - 1 x daily - 7 x weekly - 1 sets - 3 reps - 30 sec hold - Single Leg Stance with Support  - 1 x daily - 7 x weekly - 1 sets - 3 reps - 10 sec hold  GOALS: Goals reviewed with patient? Yes  SHORT TERM GOALS: Target date: 2/142025  Pt will be independent with HEP for improved balance and gait. Baseline: Goal status: IN PROGRESS  LONG TERM GOALS: Target date: 11/25/2023  Pt will be independent with progression of HEP  for improved balance and gait. Baseline:  Goal status: IN PROGRESS  2.  Pt will improve FGA score to at least 22/30 to decrease fall risk. Baseline: 18/30 Goal status: IN PROGRESS  3.  Pt will improve MCTSIB Condition 4 to 30 seconds mod sway or better, for improved balance. Baseline: 6 sec, severe sway Goal status: IN PROGRESS  ASSESSMENT:  CLINICAL IMPRESSION: Patient arrived to session without complaints-  reports still having trouble with SLS. Performed continued balance challenges focusing on SLS. Patient demonstrated good response to cueing provided. HEP was updated slightly for increased success. Hip instability likely contributing to imbalance. Balance challenges involving gait with head movements and EC compliant surface revealed the most instability today. Patient tolerated session well and without complaints upon leaving.   OBJECTIVE IMPAIRMENTS: Abnormal gait, decreased balance, decreased mobility, difficulty walking, and decreased strength.   ACTIVITY LIMITATIONS: standing, stairs, locomotion level, and caring for others  PARTICIPATION LIMITATIONS: shopping, community activity, yard work, and walking the dog, community fitness  PERSONAL FACTORS: 3+ comorbidities: see above; pt also reports anxiety  are also affecting patient's functional outcome.   REHAB POTENTIAL: Good  CLINICAL DECISION MAKING: Evolving/moderate complexity  EVALUATION COMPLEXITY: Moderate   PLAN:  PT FREQUENCY: 2x/week  PT DURATION: 6 weeks  PLANNED INTERVENTIONS: 97110-Therapeutic exercises, 97530- Therapeutic activity, 97112- Neuromuscular re-education, 97535- Self Care, 54098- Manual therapy, [phone]- Gait training, Patient/Family education, Balance training, Stair training, and Vestibular training  PLAN FOR NEXT SESSION: Review progression of HEP and continue to progress balance exercises-corner balance, hip/ankle strategy, SLS, tandem stance, forward/back walking, sidestepping, compliant surface balance  Baldemar Friday, PT, DPT 10/31/23 3:29 PM  [facility] [address], [address] [place], [place] [address] Phone # [phone] Fax # [phone]

## 2023-11-01 ENCOUNTER — Other Ambulatory Visit: Payer: Medicare Other

## 2023-11-01 ENCOUNTER — Inpatient Hospital Stay: Payer: Medicare Other

## 2023-11-01 ENCOUNTER — Inpatient Hospital Stay (HOSPITAL_BASED_OUTPATIENT_CLINIC_OR_DEPARTMENT_OTHER): Payer: Medicare Other | Admitting: Hematology and Oncology

## 2023-11-01 ENCOUNTER — Inpatient Hospital Stay: Payer: Medicare Other | Attending: Hematology and Oncology

## 2023-11-01 VITALS — BP 134/69 | HR 94 | Temp 98.3°F | Resp 16 | Wt 172.3 lb

## 2023-11-01 DIAGNOSIS — Z1721 Progesterone receptor positive status: Secondary | ICD-10-CM | POA: Insufficient documentation

## 2023-11-01 DIAGNOSIS — C50411 Malignant neoplasm of upper-outer quadrant of right female breast: Secondary | ICD-10-CM

## 2023-11-01 DIAGNOSIS — Z170 Estrogen receptor positive status [ER+]: Secondary | ICD-10-CM

## 2023-11-01 DIAGNOSIS — Z95828 Presence of other vascular implants and grafts: Secondary | ICD-10-CM

## 2023-11-01 DIAGNOSIS — Z1731 Human epidermal growth factor receptor 2 positive status: Secondary | ICD-10-CM | POA: Diagnosis not present

## 2023-11-01 DIAGNOSIS — Z79811 Long term (current) use of aromatase inhibitors: Secondary | ICD-10-CM | POA: Insufficient documentation

## 2023-11-01 DIAGNOSIS — Z923 Personal history of irradiation: Secondary | ICD-10-CM | POA: Insufficient documentation

## 2023-11-01 DIAGNOSIS — D649 Anemia, unspecified: Secondary | ICD-10-CM | POA: Insufficient documentation

## 2023-11-01 DIAGNOSIS — D509 Iron deficiency anemia, unspecified: Secondary | ICD-10-CM

## 2023-11-01 DIAGNOSIS — Z5112 Encounter for antineoplastic immunotherapy: Secondary | ICD-10-CM | POA: Insufficient documentation

## 2023-11-01 LAB — CBC WITH DIFFERENTIAL (CANCER CENTER ONLY)
Abs Immature Granulocytes: 0.01 10*3/uL (ref 0.00–0.07)
Basophils Absolute: 0.1 10*3/uL (ref 0.0–0.1)
Basophils Relative: 1 %
Eosinophils Absolute: 0.2 10*3/uL (ref 0.0–0.5)
Eosinophils Relative: 3 %
HCT: 33.5 % — ABNORMAL LOW (ref 36.0–46.0)
Hemoglobin: 11 g/dL — ABNORMAL LOW (ref 12.0–15.0)
Immature Granulocytes: 0 %
Lymphocytes Relative: 13 %
Lymphs Abs: 0.8 10*3/uL (ref 0.7–4.0)
MCH: 25.1 pg — ABNORMAL LOW (ref 26.0–34.0)
MCHC: 32.8 g/dL (ref 30.0–36.0)
MCV: 76.3 fL — ABNORMAL LOW (ref 80.0–100.0)
Monocytes Absolute: 0.8 10*3/uL (ref 0.1–1.0)
Monocytes Relative: 13 %
Neutro Abs: 4.4 10*3/uL (ref 1.7–7.7)
Neutrophils Relative %: 70 %
Platelet Count: 362 10*3/uL (ref 150–400)
RBC: 4.39 MIL/uL (ref 3.87–5.11)
RDW: 14.7 % (ref 11.5–15.5)
WBC Count: 6.2 10*3/uL (ref 4.0–10.5)
nRBC: 0 % (ref 0.0–0.2)

## 2023-11-01 LAB — CMP (CANCER CENTER ONLY)
ALT: 13 U/L (ref 0–44)
AST: 15 U/L (ref 15–41)
Albumin: 4.2 g/dL (ref 3.5–5.0)
Alkaline Phosphatase: 118 U/L (ref 38–126)
Anion gap: 7 (ref 5–15)
BUN: 12 mg/dL (ref 8–23)
CO2: 30 mmol/L (ref 22–32)
Calcium: 9.1 mg/dL (ref 8.9–10.3)
Chloride: 93 mmol/L — ABNORMAL LOW (ref 98–111)
Creatinine: 0.92 mg/dL (ref 0.44–1.00)
GFR, Estimated: >60 mL/min (ref >60)
Glucose, Bld: 86 mg/dL (ref 70–99)
Potassium: 3.7 mmol/L (ref 3.5–5.1)
Sodium: 130 mmol/L — ABNORMAL LOW (ref 135–145)
Total Bilirubin: 0.4 mg/dL (ref 0.0–1.2)
Total Protein: 6.7 g/dL (ref 6.5–8.1)

## 2023-11-01 LAB — IRON AND IRON BINDING CAPACITY (CC-WL,HP ONLY)
Iron: 53 ug/dL (ref 28–170)
Saturation Ratios: 10 % — ABNORMAL LOW (ref 10.4–31.8)
TIBC: 515 ug/dL — ABNORMAL HIGH (ref 250–450)
UIBC: 462 ug/dL — ABNORMAL HIGH (ref 148–442)

## 2023-11-01 LAB — FERRITIN: Ferritin: 8 ng/mL — ABNORMAL LOW (ref 11–307)

## 2023-11-01 MED ORDER — TRASTUZUMAB-DTTB CHEMO 150 MG IV SOLR
6.0000 mg/kg | Freq: Once | INTRAVENOUS | Status: AC
  Administered 2023-11-01: 420 mg via INTRAVENOUS
  Filled 2023-11-01: qty 20

## 2023-11-01 MED ORDER — ACETAMINOPHEN 325 MG PO TABS
650.0000 mg | ORAL_TABLET | Freq: Once | ORAL | Status: AC
Start: 2023-11-01 — End: 2023-11-01
  Administered 2023-11-01: 650 mg via ORAL
  Filled 2023-11-01: qty 2

## 2023-11-01 MED ORDER — SODIUM CHLORIDE 0.9 % IV SOLN: Freq: Once | INTRAVENOUS | Status: AC

## 2023-11-01 MED ORDER — HEPARIN SOD (PORK) LOCK FLUSH 100 UNIT/ML IV SOLN
500.0000 [IU] | Freq: Once | INTRAVENOUS | Status: AC | PRN
  Administered 2023-11-01: 500 [IU]

## 2023-11-01 MED ORDER — SODIUM CHLORIDE 0.9% FLUSH
10.0000 mL | Freq: Once | INTRAVENOUS | Status: AC
  Administered 2023-11-01: 10 mL

## 2023-11-01 MED ORDER — DIPHENHYDRAMINE HCL 25 MG PO CAPS
50.0000 mg | ORAL_CAPSULE | Freq: Once | ORAL | Status: AC
  Administered 2023-11-01: 50 mg via ORAL
  Filled 2023-11-01: qty 2

## 2023-11-01 MED ORDER — SODIUM CHLORIDE 0.9% FLUSH
10.0000 mL | INTRAVENOUS | Status: DC | PRN
  Administered 2023-11-01: 10 mL

## 2023-11-01 NOTE — Patient Instructions (Signed)
 [facility] - A DEPT OF [facility]  Discharge Instructions: Thank you for choosing [facility] to provide your oncology and hematology care.   If you have a lab appointment with the Cancer Center, please go directly to the Cancer Center and check in at the registration area.   Wear comfortable clothing and clothing appropriate for easy access to any Portacath or PICC line.   We strive to give you quality time with your provider. You may need to reschedule your appointment if you arrive late (15 or more minutes).  Arriving late affects you and other patients whose appointments are after yours.  Also, if you miss three or more appointments without notifying the office, you may be dismissed from the clinic at the provider's discretion.      For prescription refill requests, have your pharmacy contact our office and allow 72 hours for refills to be completed.    Today you received the following chemotherapy and/or immunotherapy agents Ontruzant      To help prevent nausea and vomiting after your treatment, we encourage you to take your nausea medication as directed.  BELOW ARE SYMPTOMS THAT SHOULD BE REPORTED IMMEDIATELY: *FEVER GREATER THAN 100.4 F (38 C) OR HIGHER *CHILLS OR SWEATING *NAUSEA AND VOMITING THAT IS NOT CONTROLLED WITH YOUR NAUSEA MEDICATION *UNUSUAL SHORTNESS OF BREATH *UNUSUAL BRUISING OR BLEEDING *URINARY PROBLEMS (pain or burning when urinating, or frequent urination) *BOWEL PROBLEMS (unusual diarrhea, constipation, pain near the anus) TENDERNESS IN MOUTH AND THROAT WITH OR WITHOUT PRESENCE OF ULCERS (sore throat, sores in mouth, or a toothache) UNUSUAL RASH, SWELLING OR PAIN  UNUSUAL VAGINAL DISCHARGE OR ITCHING   Items with * indicate a potential emergency and should be followed up as soon as possible or go to the Emergency Department if any problems should occur.  Please show the CHEMOTHERAPY ALERT CARD or IMMUNOTHERAPY  ALERT CARD at check-in to the Emergency Department and triage nurse.  Should you have questions after your visit or need to cancel or reschedule your appointment, please contact [facility] - A DEPT OF [facility]  Dept: [phone]  and follow the prompts.  Office hours are 8:00 a.m. to 4:30 p.m. Monday - Friday. Please note that voicemails left after 4:00 p.m. may not be returned until the following business day.  We are closed weekends and major holidays. You have access to a nurse at all times for urgent questions. Please call the main number to the clinic Dept: [phone] and follow the prompts.   For any non-urgent questions, you may also contact your provider using MyChart. We now offer e-Visits for anyone 35 and older to request care online for non-urgent symptoms. For details visit [email or web address].   Also download the MyChart app! Go to the app store, search "MyChart", open the app, select [facility], and log in with your MyChart username and password.

## 2023-11-01 NOTE — Assessment & Plan Note (Signed)
Breast Cancer Completed Herceptin treatment. Continues on Anastrozole with no reported side effects. -Continue Anastrozole daily. -Consider ctDNA testing for early detection of recurrence.  Balance Impairment Ongoing issues with balance, currently undergoing neuro PT. No significant improvement noted yet. -Continue neuro PT exercises as directed.  Cognitive Impairment Reports of repeating stories and forgetting recent information. Possible chemo brain or side effect of Anastrozole. -Consider participation in [place] memory study if eligible. -Encourage cognitive exercises such as learning a new language or puzzles.  Urinary Tract Infection Recently started on an antibiotic for a UTI. -Complete course of antibiotic as prescribed.  Port Removal Last Herceptin treatment completed, port no longer needed. -Plan for port removal with Dr. Dwain Sarna.  Follow-up -Return in approximately six months for routine follow-up. -Consider mammograms and complementary tumor DNA testing for early detection of recurrence.

## 2023-11-01 NOTE — Progress Notes (Signed)
[facility] Cancer Follow up:    Medicine, [facility] A Family [address] [place] [address]   DIAGNOSIS:  Cancer Staging  Malignant neoplasm of upper-outer quadrant of right breast in female, estrogen receptor positive (HCC) Staging form: Breast, AJCC 8th Edition - Pathologic stage from 10/05/2022: Stage IA (pT1c, pN0, cM0, G2, ER+, PR+, HER2+) - Signed by Loa Socks, NP on 11/08/2022 Stage prefix: Initial diagnosis Histologic grading system: 3 grade system   SUMMARY OF ONCOLOGIC HISTORY: Oncology History  Malignant neoplasm of upper-outer quadrant of right breast in female, estrogen receptor positive (HCC)  08/02/2022 Mammogram   In the right breast, possible distortion warrants further evaluation. In the left breast, no findings suspicious for malignancy. Diagnostic mammogram showed persistent subtle distortion central slightly LATERAL aspect of the RIGHT breast warranting tissue diagnosis.     09/01/2022 Pathology Results   Final pathology showed grade 2 invasive mammary carcinoma, prognostic showed ER 90% positive strong staining PR 90% positive strong staining, Ki-67 of 5% and HER2 3+ by [facility]   09/10/2022 Initial Diagnosis   Malignant neoplasm of upper-outer quadrant of right breast in female, estrogen receptor positive (HCC)    Genetic Testing   [facility] CancerNext-Expanded Panel+RNA was Positive. 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 Report date is 10/04/2022.  The CancerNext-Expanded gene panel offered by [facility] and includes sequencing, rearrangement, and RNA analysis for the following 77 genes: AIP, ALK, APC, ATM, AXIN2, BAP1, BARD1, BLM, BMPR1A, BRCA1, BRCA2, BRIP1, CDC73, CDH1, CDK4, CDKN1B, CDKN2A, CHEK2, CTNNA1, DICER1, FANCC, FH, FLCN, GALNT12, KIF1B, LZTR1, MAX, MEN1, MET, MLH1, MSH2, MSH3, MSH6,  MUTYH, NBN, NF1, NF2, NTHL1, PALB2, PHOX2B, PMS2, POT1, PRKAR1A, PTCH1, PTEN, RAD51C, RAD51D, RB1, RECQL, RET, SDHA, SDHAF2, SDHB, SDHC, SDHD, SMAD4, SMARCA4, SMARCB1, SMARCE1, STK11, SUFU, TMEM127, TP53, TSC1, TSC2, VHL and XRCC2 (sequencing and deletion/duplication); EGFR, EGLN1, HOXB13, KIT, MITF, PDGFRA, POLD1, and POLE (sequencing only); EPCAM and GREM1 (deletion/duplication only).    10/05/2022 Surgery   Right breast lumpectomy with Dr. 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  Grade 2, 2 sentinel lymph nodes negative for metastasis.  Margins negative.  T1c, N0.   10/05/2022 Cancer Staging   Staging form: Breast, AJCC 8th Edition - Pathologic stage from 10/05/2022: Stage IA (pT1c, pN0, cM0, G2, ER+, PR+, HER2+) - Signed by Loa Socks, NP on 11/08/2022 Stage prefix: Initial diagnosis Histologic grading system: 3 grade system   11/08/2022 -  Chemotherapy   Patient is on Treatment Plan : BREAST Paclitaxel + Trastuzumab q7d / Trastuzumab q21d     03/23/2023 - 04/19/2023 Radiation Therapy   Adjuvant radiation     CURRENT THERAPY: Herceptin  Discussed the use of AI scribe software for clinical note transcription with the patient, who gave verbal consent to proceed.  History of Present Illness    Judy Morrison is a 73 year old female with breast cancer who presents for follow-up regarding her treatment and symptoms.  She is undergoing treatment for breast cancer and takes anastrozole daily without noticeable side effects. No neuropathy symptoms are present, as she denies tingling or 'needle' sensations.  She experiences ongoing balance issues and is attending neuro physical therapy. She describes having 'good days and bad days' and is actively participating in exercises such as standing with her eyes closed. She has been attending therapy for about two weeks but  has not yet noticed significant improvement.  Her memory is described as 'not great'  and slightly worse, with frequent repetition of stories. She sometimes forgets tasks, such as calling someone, despite reminders. She manages her finances and daily activities but experiences some cognitive clouding, possibly related to her cancer treatment history. There is no known family history of memory impairments or related conditions.  She is currently taking an antibiotic for a urinary tract infection, with symptoms of frequent urination noted on Saturday. She has a history of chronic UTIs but denies fever or other systemic symptoms. No issues with swallowing and bowel movements are regular.  She takes Adderall daily, prescribed by her psychiatrist to help with feeling scattered. Her general health maintenance was discussed in the context of cancer follow-up and cognitive health.  Rest of the pertinent 10 point ROS reviewed and negative  Patient Active Problem List   Diagnosis Date Noted   Sensorineural hearing loss, bilateral 10/11/2023   Cognitive changes 05/02/2023   Port-A-Cath in place 11/15/2022   Genetic testing 10/04/2022   Monoallelic mutation of ATM gene 21/30/8657   Malignant neoplasm of upper-outer quadrant of right breast in female, estrogen receptor positive (HCC) 09/10/2022   Rash and other nonspecific skin eruption 09/15/2020   Allergic contact dermatitis 09/15/2020   Pruritus 08/14/2020   Dizziness 05/10/2018   Atypical chest pain 05/10/2018   Primary osteoarthritis of left knee 01/06/2018   S/P knee replacement 08/12/2017   Incomplete tear of right rotator cuff 05/19/2017   Osteopenia 05/19/2017   Urge incontinence of urine 05/19/2017   Primary open angle glaucoma (POAG) 12/30/2016   Primary osteoarthritis of both knees 12/30/2016   Recurrent oral herpes simplex infection 12/30/2016   Tubular adenoma of colon 12/30/2016   Hypothyroidism 06/25/2016   Right foot pain 12/19/2014   Anxiety 05/21/2011   Seasonal allergies 05/21/2011    is allergic to  penicillin g, cephalexin, codeine, and erythromycin.  MEDICAL HISTORY: Past Medical History:  Diagnosis Date   Anemia    Anxiety    Arthritis    Breast cancer (HCC)    Depressed    Dermatitis    rare form, on prednisone   GERD (gastroesophageal reflux disease)    controlled with Omeprazole   Glaucoma    Personal history of chemotherapy    Personal history of radiation therapy    Torn rotator cuff    RIGHT    Urinary tract infection    dx 05-26-17 took 1 week cipro BID completed 06-02-17.    SURGICAL HISTORY: Past Surgical History:  Procedure Laterality Date   ADENOIDECTOMY     APPENDECTOMY     arthroscopic right knee      BREAST BIOPSY Right 09/01/2022   MM RT BREAST BX W LOC DEV 1ST LESION IMAGE BX SPEC STEREO GUIDE 09/01/2022 [facility]   BREAST BIOPSY  10/01/2022   MM RT RADIOACTIVE SEED LOC MAMMO GUIDE 10/01/2022 [facility]   BREAST LUMPECTOMY     BREAST LUMPECTOMY WITH RADIOACTIVE SEED AND SENTINEL LYMPH NODE BIOPSY Right 10/05/2022   Procedure: RIGHT BREAST LUMPECTOMY WITH RADIOACTIVE SEED AND AXILLARY SENTINEL LYMPH NODE BIOPSY;  Surgeon: [name], MD;  Location: [facility];  Service: General;  Laterality: Right;   CHOLECYSTECTOMY     COLONOSCOPY  06/06/2017   Pyrtle   Pelvic sling     x2   POLYPECTOMY     PORT A CATH REVISION N/A 11/22/2022   Procedure: PORT A CATH REVISION;  Surgeon: Dwain Sarna,  Molli Hazard, MD;  Location: [facility] OR;  Service: General;  Laterality: N/A;   PORTACATH PLACEMENT Left 10/05/2022   Procedure: INSERTION PORT-A-CATH;  Surgeon: [name], MD;  Location: [facility];  Service: General;  Laterality: Left;   ROTATOR CUFF REPAIR Right    TONSILLECTOMY     TOTAL KNEE ARTHROPLASTY Right 08/12/2017   Procedure: RIGHT TOTAL KNEE ARTHROPLASTY;  Surgeon: Eugenia Mcalpine, MD;  Location: [facility];  Service: Orthopedics;  Laterality: Right;   TOTAL KNEE ARTHROPLASTY Left 01/06/2018   Procedure:  LEFT TOTAL KNEE ARTHROPLASTY;  Surgeon: Eugenia Mcalpine, MD;  Location: [facility];  Service: Orthopedics;  Laterality: Left;   UPPER GASTROINTESTINAL ENDOSCOPY     WISDOM TOOTH EXTRACTION      SOCIAL HISTORY: Social History   Socioeconomic History   Marital status: Single    Spouse name: Not on file   Number of children: Not on file   Years of education: Not on file   Highest education level: Not on file  Occupational History   Not on file  Tobacco Use   Smoking status: Former    Current packs/day: 0.00    Types: Cigarettes    Quit date: 72    Years since quitting: 39.1   Smokeless tobacco: Never  Vaping Use   Vaping status: Never Used  Substance and Sexual Activity   Alcohol use: Yes    Alcohol/week: 2.0 standard drinks of alcohol    Types: 2 Cans of beer per week   Drug use: No   Sexual activity: Not on file  Other Topics Concern   Not on file  Social History Narrative   Not on file   Social Drivers of Health   Financial Resource Strain: Low Risk  (04/11/2023)   Received from [facility]   Overall Financial Resource Strain (CARDIA)    Difficulty of Paying Living Expenses: Not hard at all  Food Insecurity: No Food Insecurity (04/11/2023)   Received from [facility]   Hunger Vital Sign    Worried About Running Out of Food in the Last Year: Never true    Ran Out of Food in the Last Year: Never true  Transportation Needs: No Transportation Needs (04/11/2023)   Received from [facility] - Transportation    Lack of Transportation (Medical): No    Lack of Transportation (Non-Medical): No  Physical Activity: Sufficiently Active (08/22/2022)   Received from [facility]   Exercise Vital Sign    Days of Exercise per Week: 3 days    Minutes of Exercise per Session: 50 min  Stress: No Stress Concern Present (07/28/2021)   Received from [facility] - Occupational Stress  Questionnaire    Feeling of Stress : Not at all  Social Connections: Socially Integrated (08/22/2022)   Received from [facility], [facility]   Social Network    How would you rate your social network (family, work, friends)?: Good participation with social networks  Intimate Partner Violence: Not At Risk (08/22/2022)   Received from [facility], [facility]   HITS    Over the last 12 months how often did your partner physically hurt you?: Never    Over the last 12 months how often did your partner insult you or talk down to you?: Never    Over the last 12 months how often did your partner threaten you with physical harm?: Never    Over the last  12 months how often did your partner scream or curse at you?: Never    FAMILY HISTORY: Family History  Problem Relation Age of Onset   Bowel Disease Mother        ishemic bowel   Lung cancer Mother        smoked   Cervical cancer Paternal Aunt    Cancer Maternal Grandfather        unknown type   Lung cancer Paternal Grandfather        smoked   Colon cancer Neg Hx    Breast cancer Neg Hx    Esophageal cancer Neg Hx    Pancreatic cancer Neg Hx    Prostate cancer Neg Hx    Rectal cancer Neg Hx    Stomach cancer Neg Hx    Allergic rhinitis Neg Hx    Asthma Neg Hx    Eczema Neg Hx    Urticaria Neg Hx    Colon polyps Neg Hx     Review of Systems  Constitutional:  Negative for appetite change, chills, fatigue, fever and unexpected weight change.  HENT:   Negative for hearing loss, lump/mass and trouble swallowing.   Eyes:  Negative for eye problems and icterus.  Respiratory:  Negative for chest tightness, cough and shortness of breath.   Cardiovascular:  Negative for chest pain, leg swelling and palpitations.  Gastrointestinal:  Negative for abdominal distention, abdominal pain, constipation, diarrhea, nausea and vomiting.  Endocrine: Negative for hot flashes.  Genitourinary:  Negative for difficulty urinating.    Musculoskeletal:  Negative for arthralgias.  Skin:  Negative for itching and rash.  Neurological:  Negative for dizziness, extremity weakness, headaches and numbness.  Hematological:  Negative for adenopathy. Does not bruise/bleed easily.  Psychiatric/Behavioral:  Negative for depression. The patient is not nervous/anxious.       PHYSICAL EXAMINATION    Vitals:   11/01/23 1055  BP: 134/69  Pulse: 94  Resp: 16  Temp: 98.3 F (36.8 C)  SpO2: 95%     Physical Exam Constitutional:      General: She is not in acute distress.    Appearance: Normal appearance. She is not toxic-appearing.  HENT:     Head: Normocephalic and atraumatic.     Mouth/Throat:     Mouth: Mucous membranes are moist.     Pharynx: Oropharynx is clear. No oropharyngeal exudate or posterior oropharyngeal erythema.  Eyes:     General: No scleral icterus. Cardiovascular:     Rate and Rhythm: Normal rate and regular rhythm.     Pulses: Normal pulses.     Heart sounds: Normal heart sounds.  Pulmonary:     Effort: Pulmonary effort is normal.     Breath sounds: Normal breath sounds.  Abdominal:     General: Abdomen is flat. Bowel sounds are normal. There is no distension.     Palpations: Abdomen is soft.     Tenderness: There is no abdominal tenderness.  Musculoskeletal:        General: No swelling.     Cervical back: Neck supple.  Lymphadenopathy:     Cervical: No cervical adenopathy.  Skin:    General: Skin is warm and dry.     Findings: No rash.  Neurological:     General: No focal deficit present.     Mental Status: She is alert.  Psychiatric:        Mood and Affect: Mood normal.        Behavior: Behavior normal.  LABORATORY DATA:  CBC    Component Value Date/Time   WBC 6.2 11/01/2023 1022   WBC 10.1 10/01/2021 1314   RBC 4.39 11/01/2023 1022   HGB 11.0 (L) 11/01/2023 1022   HGB 13.7 09/15/2020 1209   HCT 33.5 (L) 11/01/2023 1022   HCT 41.3 09/15/2020 1209   PLT 362 11/01/2023  1022   PLT 349 09/15/2020 1209   MCV 76.3 (L) 11/01/2023 1022   MCV 86 09/15/2020 1209   MCH 25.1 (L) 11/01/2023 1022   MCHC 32.8 11/01/2023 1022   RDW 14.7 11/01/2023 1022   RDW 12.3 09/15/2020 1209   LYMPHSABS 0.8 11/01/2023 1022   LYMPHSABS 1.2 09/15/2020 1209   MONOABS 0.8 11/01/2023 1022   EOSABS 0.2 11/01/2023 1022   EOSABS 0.2 09/15/2020 1209   BASOSABS 0.1 11/01/2023 1022   BASOSABS 0.1 09/15/2020 1209    CMP     Component Value Date/Time   NA 130 (L) 11/01/2023 1022   NA 135 09/15/2020 1209   K 3.7 11/01/2023 1022   CL 93 (L) 11/01/2023 1022   CO2 30 11/01/2023 1022   GLUCOSE 86 11/01/2023 1022   BUN 12 11/01/2023 1022   BUN 14 09/15/2020 1209   CREATININE 0.92 11/01/2023 1022   CALCIUM 9.1 11/01/2023 1022   PROT 6.7 11/01/2023 1022   PROT 6.8 09/15/2020 1209   ALBUMIN 4.2 11/01/2023 1022   ALBUMIN 4.6 09/15/2020 1209   AST 15 11/01/2023 1022   ALT 13 11/01/2023 1022   ALKPHOS 118 11/01/2023 1022   BILITOT 0.4 11/01/2023 1022   GFRNONAA >60 11/01/2023 1022   GFRAA 74 09/15/2020 1209      ASSESSMENT and THERAPY PLAN:   Malignant neoplasm of upper-outer quadrant of right breast in female, estrogen receptor positive (HCC) Breast Cancer Completed Herceptin treatment. Continues on Anastrozole with no reported side effects. -Continue Anastrozole daily. -Consider ctDNA testing for early detection of recurrence.  Balance Impairment Ongoing issues with balance, currently undergoing neuro PT. No significant improvement noted yet. -Continue neuro PT exercises as directed.  Cognitive Impairment Reports of repeating stories and forgetting recent information. Possible chemo brain or side effect of Anastrozole. -Consider participation in [place] memory study if eligible. -Encourage cognitive exercises such as learning a new language or puzzles.  Urinary Tract Infection Recently started on an antibiotic for a UTI. -Complete course of antibiotic as  prescribed.  Port Removal Last Herceptin treatment completed, port no longer needed. -Plan for port removal with Dr. Dwain Sarna.  Follow-up -Return in approximately six months for routine follow-up. -Consider mammograms and complementary tumor DNA testing for early detection of recurrence.    All questions were answered. The patient knows to call the clinic with any problems, questions or concerns. We can certainly see the patient much sooner if necessary.  Total encounter time:30 minutes*in face-to-face visit time, chart review, lab review, care coordination, order entry, and documentation of the encounter time.  *Total Encounter Time as defined by the Centers for Medicare and Medicaid Services includes, in addition to the face-to-face time of a patient visit (documented in the note above) non-face-to-face time: obtaining and reviewing outside history, ordering and reviewing medications, tests or procedures, care coordination (communications with other health care professionals or caregivers) and documentation in the medical record.

## 2023-11-02 ENCOUNTER — Encounter: Payer: Self-pay | Admitting: Genetic Counselor

## 2023-11-02 ENCOUNTER — Telehealth: Payer: Self-pay

## 2023-11-02 NOTE — Telephone Encounter (Signed)
[id number]: COGNITIVE TRAINING FOR CANCER RELATED COGNITIVE IMPAIRMENT IN BREAST CANCER SURVIVORS: A MULTI-CENTER RANDOMIZED DOUBLE- BLINDED CONTROLLED TRIAL   Rec'd referral from Dr Al Pimple with request to call daughter to discuss study. Called Leigh; no answer, left VM. Clinic will be closing early today d/t weather; left this information in my message.  Judy Chance Jeovani Weisenburger, RN, BSN, [facility] She  Her  Hers Clinical Research Nurse [facility] Direct Dial [phone] 11/02/2023 11:13 AM

## 2023-11-03 ENCOUNTER — Ambulatory Visit: Payer: Medicare Other | Admitting: Physical Therapy

## 2023-11-03 ENCOUNTER — Telehealth: Payer: Self-pay | Admitting: Hematology and Oncology

## 2023-11-03 NOTE — Telephone Encounter (Signed)
 Left patient a vm regarding upcoming appointment

## 2023-11-04 ENCOUNTER — Telehealth: Payer: Self-pay

## 2023-11-04 ENCOUNTER — Ambulatory Visit: Payer: Medicare Other | Admitting: Physical Therapy

## 2023-11-04 ENCOUNTER — Encounter: Payer: Self-pay | Admitting: Physical Therapy

## 2023-11-04 ENCOUNTER — Other Ambulatory Visit: Payer: Self-pay

## 2023-11-04 DIAGNOSIS — R42 Dizziness and giddiness: Secondary | ICD-10-CM

## 2023-11-04 DIAGNOSIS — R2681 Unsteadiness on feet: Secondary | ICD-10-CM

## 2023-11-04 NOTE — Telephone Encounter (Signed)
[id number]: COGNITIVE TRAINING FOR CANCER RELATED COGNITIVE IMPAIRMENT IN BREAST CANCER SURVIVORS: A MULTI-CENTER RANDOMIZED DOUBLE- BLINDED CONTROLLED TRIAL   Called patient to discuss study. No answer, left VM.  Margret Chance Ashe Gago, RN, BSN, [facility] She  Her  Hers Clinical Research Nurse [facility] Direct Dial [phone] 11/04/2023 10:35 AM

## 2023-11-04 NOTE — Therapy (Signed)
OUTPATIENT PHYSICAL THERAPY VESTIBULAR TREATMENT NOTE     Patient Name: Judy Morrison MRN: [id number] DOB:[birth year], 73 y.o., female Today's Date: 11/04/2023  END OF SESSION:  PT End of Session - 11/04/23 1022     Visit Number 5    Number of Visits 12    Date for PT Re-Evaluation 11/25/23    Authorization Type Medicare/[employer]    PT Start Time 1020    PT Stop Time 1059    PT Time Calculation (min) 39 min    Equipment Utilized During Treatment Gait belt    Activity Tolerance Patient tolerated treatment well    Behavior During Therapy WFL for tasks assessed/performed               Past Medical History:  Diagnosis Date   Anemia    Anxiety    Arthritis    Breast cancer (HCC)    Depressed    Dermatitis    rare form, on prednisone   GERD (gastroesophageal reflux disease)    controlled with Omeprazole   Glaucoma    Personal history of chemotherapy    Personal history of radiation therapy    Torn rotator cuff    RIGHT    Urinary tract infection    dx 05-26-17 took 1 week cipro BID completed 06-02-17.   Past Surgical History:  Procedure Laterality Date   ADENOIDECTOMY     APPENDECTOMY     arthroscopic right knee      BREAST BIOPSY Right 09/01/2022   MM RT BREAST BX W LOC DEV 1ST LESION IMAGE BX SPEC STEREO GUIDE 09/01/2022 [facility]   BREAST BIOPSY  10/01/2022   MM RT RADIOACTIVE SEED LOC MAMMO GUIDE 10/01/2022 [facility]   BREAST LUMPECTOMY     BREAST LUMPECTOMY WITH RADIOACTIVE SEED AND SENTINEL LYMPH NODE BIOPSY Right 10/05/2022   Procedure: RIGHT BREAST LUMPECTOMY WITH RADIOACTIVE SEED AND AXILLARY SENTINEL LYMPH NODE BIOPSY;  Surgeon: [name], MD;  Location: [facility];  Service: General;  Laterality: Right;   CHOLECYSTECTOMY     COLONOSCOPY  06/06/2017   Pyrtle   Pelvic sling     x2   POLYPECTOMY     PORT A CATH REVISION N/A 11/22/2022   Procedure: PORT A CATH REVISION;  Surgeon: [name], MD;  [facility] OR;  Service: General;  Laterality: N/A;   PORTACATH PLACEMENT Left 10/05/2022   Procedure: INSERTION PORT-A-CATH;  Surgeon: [name], MD;  Location: [facility];  Service: General;  Laterality: Left;   ROTATOR CUFF REPAIR Right    TONSILLECTOMY     TOTAL KNEE ARTHROPLASTY Right 08/12/2017   Procedure: RIGHT TOTAL KNEE ARTHROPLASTY;  Surgeon: Eugenia Mcalpine, MD;  Location: [facility];  Service: Orthopedics;  Laterality: Right;   TOTAL KNEE ARTHROPLASTY Left 01/06/2018   Procedure: LEFT TOTAL KNEE ARTHROPLASTY;  Surgeon: Eugenia Mcalpine, MD;  Location: [facility];  Service: Orthopedics;  Laterality: Left;   UPPER GASTROINTESTINAL ENDOSCOPY     WISDOM TOOTH EXTRACTION     Patient Active Problem List   Diagnosis Date Noted   Sensorineural hearing loss, bilateral 10/11/2023   Cognitive changes 05/02/2023   Port-A-Cath in place 11/15/2022   Genetic testing 10/04/2022   Monoallelic mutation of ATM gene 86/57/8469   Malignant neoplasm of upper-outer quadrant of right breast in female, estrogen receptor positive (HCC) 09/10/2022   Rash and other nonspecific skin eruption 09/15/2020   Allergic contact dermatitis 09/15/2020   Pruritus 08/14/2020   Dizziness  05/10/2018   Atypical chest pain 05/10/2018   Primary osteoarthritis of left knee 01/06/2018   S/P knee replacement 08/12/2017   Incomplete tear of right rotator cuff 05/19/2017   Osteopenia 05/19/2017   Urge incontinence of urine 05/19/2017   Primary open angle glaucoma (POAG) 12/30/2016   Primary osteoarthritis of both knees 12/30/2016   Recurrent oral herpes simplex infection 12/30/2016   Tubular adenoma of colon 12/30/2016   Hypothyroidism 06/25/2016   Right foot pain 12/19/2014   Anxiety 05/21/2011   Seasonal allergies 05/21/2011    PCP: Medicine, [facility] REFERRING PROVIDER:   Newman Pies, MD    REFERRING DIAG: R42 (ICD-10-CM) - Dizziness   THERAPY DIAG:   Unsteadiness on feet  Dizziness and giddiness  ONSET DATE: 10/11/2023 (MD referral)  Rationale for Evaluation and Treatment: Rehabilitation  SUBJECTIVE:   SUBJECTIVE STATEMENT: Got to ring the bell earlier this week, as I had my last cancer treatment.  Was very off-balance yesterday.    Pt accompanied by: self  PERTINENT HISTORY: breast cancer, R and L TKA, R RTC surgery  PAIN:  Are you having pain? No   PRECAUTIONS: Fall  RED FLAGS: None   WEIGHT BEARING RESTRICTIONS: No  FALLS: Has patient fallen in last 6 months? Yes. Number of falls 2; one fall walking dog at night; one fall stepping onto elevator *Reports fear of stumbling*  LIVING ENVIRONMENT: Lives with: lives alone daughter lives nearby Lives in: House/apartment Stairs: Yes: External: flight of steps; on right going up and on left going up Has elevator Has following equipment at home: Single point cane and Walker - 2 wheeled  PLOF: Independent and Leisure: enjoyed walking dog (dog is now living with family)  PATIENT GOALS: To get balance better  OBJECTIVE:    TODAY'S TREATMENT: 11/04/2023 Activity Comments  Review of compliant surface balance exercise: On airex-feet apart/together EO and EC head motions   On Airex:  feet apart/together EO/EC head motions With EC- increased lean to R side  On Airex: forward step taps to ground Side step taps to ground Back step taps to ground Forward/back step and weightshift UE support, initial cues for foot clearance  Forward step taps to 6" step, then back step/runner's stretch position -on solid surface -on Airex More instability with RLE as stance  Sidestep/together light UE support 2 x 10 reps   Gait training with SPC, 50 ft x 8 reps Cues for sequence, step length, appropriate cane height.  Good sequence with L hand and RLE.  Starts with narrow BOS and improves with cues for wider BOS      HOME EXERCISE PROGRAM Last updated: 10/31/23 Access Code:  [email or web address] URL: [email or web address] Date: 10/31/2023 Prepared by: [facility] - Outpatient  Rehab - [facility]  Exercises - Narrow Stance with Counter Support  - 1 x daily - 7 x weekly - 1 sets - 3 reps - 30 sec hold - Heel Toe Raises with Counter Support  - 1 x daily - 7 x weekly - 3 sets - 10 reps - Standing on Foam Pad  - 1 x daily - 7 x weekly - 1 sets - 5 reps - Standing Balance with Eyes Closed on Foam  - 1 x daily - 7 x weekly - 1 sets - 5 reps - Side Stepping with Resistance at Thighs and Counter Support  - 1 x daily - 7 x weekly - 3 sets - 1-2 min rounds hold - Sit to Stand on Foam Pad  -  1 x daily - 7 x weekly - 3 sets - 10 reps - Standing Toe Taps  - 1 x daily - 5 x weekly - 2 sets - 10 reps    PATIENT EDUCATION: Education details: HEP update with edu for safety Person educated: Patient Education method: Explanation, Demonstration, Tactile cues, Verbal cues, and Handouts Education comprehension: verbalized understanding and returned demonstration     ---------------------------------------------- Note: Objective measures were completed at Evaluation unless otherwise noted.  DIAGNOSTIC FINDINGS: WFL  COGNITION: Overall cognitive status: Within functional limits for tasks assessed   SENSATION: Light touch: WFL  POSTURE:  rounded shoulders   LOWER EXTREMITY MMT:   MMT Right eval Left eval  Hip flexion 4+ 4+  Hip abduction 4+ 4+  Hip adduction 4+ 4+  Hip internal rotation    Hip external rotation    Knee flexion 5 4+  Knee extension 5 5  Ankle dorsiflexion 4 4  Ankle plantarflexion    Ankle inversion    Ankle eversion    (Blank rows = not tested)   TRANSFERS: Assistive device utilized: None  Sit to stand: Complete Independence Stand to sit: Complete Independence  GAIT: Gait pattern: step through pattern, decreased arm swing- Right, and narrow BOS Distance walked: 50 ft Assistive device utilized: None Level of assistance:  SBA Comments: guarded gait pattern, decreased timing of LLE swing through with gait.  FUNCTIONAL TESTS:  5 times sit to stand: 11.69 sec 10 meter walk test: NT Functional gait assessment: 18/30     M-CTSIB  Condition 1: Firm Surface, EO 30 Sec, Normal Sway  Condition 2: Firm Surface, EC 30 Sec, Moderate Sway  Condition 3: Foam Surface, EO 30 Sec, Mild Sway  Condition 4: Foam Surface, EC 6.29 Sec, Severe Sway    SLS:  10 sec RLE, 5.68 sec LLE Tandem stance:  30 seconds LL posterior; 19.47 sec RLE posterior PATIENT SURVEYS: (pt rates in regards to balance) DHI 42; (moderate handicap 36-52)  VESTIBULAR ASSESSMENT: (DID not proceed, as pt reports balance issues, not dizziness)                                                                                                                            TREATMENT DATE: 10/18/2023      PATIENT EDUCATION: Education details: PT eval results, POC, rationale for balance training/vestibular rehab based on decreased vestibular system use for balance; initiated HEP Person educated: Patient Education method: Explanation Education comprehension: verbalized understanding  HOME EXERCISE PROGRAM: Access Code: [email or web address] URL: [email or web address] Date: 10/18/2023 Prepared by: [facility] - Outpatient  Rehab - [facility]  Exercises - Narrow Stance with Counter Support  - 1 x daily - 7 x weekly - 1 sets - 3 reps - 30 sec hold - Single Leg Stance with Support  - 1 x daily - 7 x weekly - 1 sets - 3 reps - 10 sec hold  GOALS: Goals reviewed with patient? Yes  SHORT TERM GOALS:  Target date: 2/142025  Pt will be independent with HEP for improved balance and gait. Baseline: Goal status: MET2/21/2025  LONG TERM GOALS: Target date: 11/25/2023  Pt will be independent with progression of HEP for improved balance and gait. Baseline:  Goal status: IN PROGRESS  2.  Pt will improve FGA score to at least 22/30 to decrease fall  risk. Baseline: 18/30 Goal status: IN PROGRESS  3.  Pt will improve MCTSIB Condition 4 to 30 seconds mod sway or better, for improved balance. Baseline: 6 sec, severe sway Goal status: IN PROGRESS  ASSESSMENT:  CLINICAL IMPRESSION: Pt presents today with reports of being very off-balance yesterday. Skilled PT session focused on review of HEP, and further balance work on solid and compliant surfaces.  Pt needs at least 1 UE support for optimal stability.  RLE as stance on compliant surface is still difficult and unsteady for her.  She is open to trying cane with gait for days where she is more off balance.  She does best with cane in L hand, sequencing with R foot, and initially has narrow BOS with LLE scissoring closer to RLE.  With cues, she does a nice job widening her BOS and is more stable with gait with cane. Pt will continue to benefit from skilled PT towards goals for improved functional mobility and decreased fall risk.   OBJECTIVE IMPAIRMENTS: Abnormal gait, decreased balance, decreased mobility, difficulty walking, and decreased strength.   ACTIVITY LIMITATIONS: standing, stairs, locomotion level, and caring for others  PARTICIPATION LIMITATIONS: shopping, community activity, yard work, and walking the dog, community fitness  PERSONAL FACTORS: 3+ comorbidities: see above; pt also reports anxiety  are also affecting patient's functional outcome.   REHAB POTENTIAL: Good  CLINICAL DECISION MAKING: Evolving/moderate complexity  EVALUATION COMPLEXITY: Moderate   PLAN:  PT FREQUENCY: 2x/week  PT DURATION: 6 weeks  PLANNED INTERVENTIONS: 97110-Therapeutic exercises, 97530- Therapeutic activity, 97112- Neuromuscular re-education, 97535- Self Care, 16109- Manual therapy, [phone]- Gait training, Patient/Family education, Balance training, Stair training, and Vestibular training  PLAN FOR NEXT SESSION: Continue progression of HEP and continue to progress balance exercises-corner  balance, hip/ankle strategy, SLS, tandem stance, forward/back walking, sidestepping, compliant surface balance.  Gait with cane.  Lonia Blood, PT 11/04/23 11:58 AM Phone: [phone] Fax: [phone]  [facility]patient Rehab at [facility] [address] [place], [address] [place], [place] [address] Phone # [phone] Fax # [phone]

## 2023-11-04 NOTE — Telephone Encounter (Signed)
[id number]: COGNITIVE TRAINING FOR CANCER RELATED COGNITIVE IMPAIRMENT IN BREAST CANCER SURVIVORS: A MULTI-CENTER RANDOMIZED DOUBLE- BLINDED CONTROLLED TRIAL   Ms Warning called me back but I was away from my phone. I attempted to call her back; no answer, left VM.  Margret Chance Dorothee Napierkowski, RN, BSN, [facility]  [facility]  [facility] Clinical Research Nurse [facility] Direct Dial [phone] 11/04/2023 1:07 PM

## 2023-11-07 ENCOUNTER — Encounter: Payer: Self-pay | Admitting: Physical Therapy

## 2023-11-07 ENCOUNTER — Ambulatory Visit: Payer: Medicare Other | Admitting: Physical Therapy

## 2023-11-07 DIAGNOSIS — R42 Dizziness and giddiness: Secondary | ICD-10-CM

## 2023-11-07 DIAGNOSIS — R2681 Unsteadiness on feet: Secondary | ICD-10-CM

## 2023-11-07 NOTE — Therapy (Signed)
 OUTPATIENT PHYSICAL THERAPY VESTIBULAR TREATMENT NOTE     Patient Name: Judy Morrison MRN: [id number] DOB:1950-09-17, 72 y.o., female Today's Date: 11/07/2023  END OF SESSION:  PT End of Session - 11/07/23 0857     Visit Number 6    Number of Visits 12    Date for PT Re-Evaluation 11/25/23    Authorization Type Medicare/[employer]    PT Start Time 0850    PT Stop Time 0931    PT Time Calculation (min) 41 min    Equipment Utilized During Treatment Gait belt    Activity Tolerance Patient tolerated treatment well    Behavior During Therapy WFL for tasks assessed/performed                Past Medical History:  Diagnosis Date   Anemia    Anxiety    Arthritis    Breast cancer (HCC)    Depressed    Dermatitis    rare form, on prednisone   GERD (gastroesophageal reflux disease)    controlled with Omeprazole   Glaucoma    Personal history of chemotherapy    Personal history of radiation therapy    Torn rotator cuff    RIGHT    Urinary tract infection    dx 05-26-17 took 1 week cipro BID completed 06-02-17.   Past Surgical History:  Procedure Laterality Date   ADENOIDECTOMY     APPENDECTOMY     arthroscopic right knee      BREAST BIOPSY Right 09/01/2022   MM RT BREAST BX W LOC DEV 1ST LESION IMAGE BX SPEC STEREO GUIDE 09/01/2022 [facility]   BREAST BIOPSY  10/01/2022   MM RT RADIOACTIVE SEED LOC MAMMO GUIDE 10/01/2022 [facility]   BREAST LUMPECTOMY     BREAST LUMPECTOMY WITH RADIOACTIVE SEED AND SENTINEL LYMPH NODE BIOPSY Right 10/05/2022   Procedure: RIGHT BREAST LUMPECTOMY WITH RADIOACTIVE SEED AND AXILLARY SENTINEL LYMPH NODE BIOPSY;  Surgeon: [name], MD;  Location: [facility];  Service: General;  Laterality: Right;   CHOLECYSTECTOMY     COLONOSCOPY  06/06/2017   Pyrtle   Pelvic sling     x2   POLYPECTOMY     PORT A CATH REVISION N/A 11/22/2022   Procedure: PORT A CATH REVISION;  Surgeon: [name], MD;  Location: [facility] OR;  Service: General;  Laterality: N/A;   PORTACATH PLACEMENT Left 10/05/2022   Procedure: INSERTION PORT-A-CATH;  Surgeon: [name], MD;  Location: [facility];  Service: General;  Laterality: Left;   ROTATOR CUFF REPAIR Right    TONSILLECTOMY     TOTAL KNEE ARTHROPLASTY Right 08/12/2017   Procedure: RIGHT TOTAL KNEE ARTHROPLASTY;  Surgeon: Eugenia Mcalpine, MD;  Location: [facility];  Service: Orthopedics;  Laterality: Right;   TOTAL KNEE ARTHROPLASTY Left 01/06/2018   Procedure: LEFT TOTAL KNEE ARTHROPLASTY;  Surgeon: Eugenia Mcalpine, MD;  Location: [facility];  Service: Orthopedics;  Laterality: Left;   UPPER GASTROINTESTINAL ENDOSCOPY     WISDOM TOOTH EXTRACTION     Patient Active Problem List   Diagnosis Date Noted   Sensorineural hearing loss, bilateral 10/11/2023   Cognitive changes 05/02/2023   Port-A-Cath in place 11/15/2022   Genetic testing 10/04/2022   Monoallelic mutation of ATM gene 40/98/1191   Malignant neoplasm of upper-outer quadrant of right breast in female, estrogen receptor positive (HCC) 09/10/2022   Rash and other nonspecific skin eruption 09/15/2020   Allergic contact dermatitis 09/15/2020   Pruritus 08/14/2020  Dizziness 05/10/2018   Atypical chest pain 05/10/2018   Primary osteoarthritis of left knee 01/06/2018   S/P knee replacement 08/12/2017   Incomplete tear of right rotator cuff 05/19/2017   Osteopenia 05/19/2017   Urge incontinence of urine 05/19/2017   Primary open angle glaucoma (POAG) 12/30/2016   Primary osteoarthritis of both knees 12/30/2016   Recurrent oral herpes simplex infection 12/30/2016   Tubular adenoma of colon 12/30/2016   Hypothyroidism 06/25/2016   Right foot pain 12/19/2014   Anxiety 05/21/2011   Seasonal allergies 05/21/2011    PCP: Medicine, [facility] REFERRING PROVIDER:   Newman Pies, MD    REFERRING DIAG: R42 (ICD-10-CM) - Dizziness   THERAPY DIAG:   Unsteadiness on feet  Dizziness and giddiness  ONSET DATE: 10/11/2023 (MD referral)  Rationale for Evaluation and Treatment: Rehabilitation  SUBJECTIVE:   SUBJECTIVE STATEMENT: Just realize how weak my left leg is.    Pt accompanied by: self  PERTINENT HISTORY: breast cancer, R and L TKA, R RTC surgery  PAIN:  Are you having pain? No   PRECAUTIONS: Fall  RED FLAGS: None   WEIGHT BEARING RESTRICTIONS: No  FALLS: Has patient fallen in last 6 months? Yes. Number of falls 2; one fall walking dog at night; one fall stepping onto elevator *Reports fear of stumbling*  LIVING ENVIRONMENT: Lives with: lives alone daughter lives nearby Lives in: House/apartment Stairs: Yes: External: flight of steps; on right going up and on left going up Has elevator Has following equipment at home: Single point cane and Walker - 2 wheeled  PLOF: Independent and Leisure: enjoyed walking dog (dog is now living with family)  PATIENT GOALS: To get balance better  OBJECTIVE:    TODAY'S TREATMENT: 11/07/2023 Activity Comments  NuStep Level 5>4, 4 extremities x 8 minutes SPM 80-90, decreases with conversation  Gait training with SPC, 50 ft x 10 reps Good wide BOS, less veering with gait than last session  Vitals 98% O2, HR 92 bpm   Resisted sidestep along counter, 2 minutes Green band at ankles  Sidestep together R and L, 10 reps x 2 Green band at [employer] walks forward, 4 reps along counter   Forward<>back step and weight shift, 15 reps each leg UE support, Trendelenburg  Forward/back walking at counter, 3 reps   Vitals 97% O2, HR 100 bpm           HOME EXERCISE PROGRAM Last updated: 10/31/23 Access Code: [email or web address] URL: [email or web address] Date: 10/31/2023 Prepared by: [facility] - Outpatient  Rehab - [facility]  Exercises - Narrow Stance with Counter Support  - 1 x daily - 7 x weekly - 1 sets - 3 reps - 30 sec hold - Heel Toe Raises with Counter  Support  - 1 x daily - 7 x weekly - 3 sets - 10 reps - Standing on Foam Pad  - 1 x daily - 7 x weekly - 1 sets - 5 reps - Standing Balance with Eyes Closed on Foam  - 1 x daily - 7 x weekly - 1 sets - 5 reps - Side Stepping with Resistance at Thighs and Counter Support  - 1 x daily - 7 x weekly - 3 sets - 1-2 min rounds hold - Sit to Stand on Foam Pad  - 1 x daily - 7 x weekly - 3 sets - 10 reps - Standing Toe Taps  - 1 x daily - 5 x weekly - 2 sets - 10 reps  PATIENT EDUCATION: Education details: 2/24/2025Fall prevention and gait safety-use of cane, fatigue levels with gait Person educated: Patient Education method: Explanation, Demonstration, Tactile cues, Verbal cues, and Handouts Education comprehension: verbalized understanding and returned demonstration     ---------------------------------------------- Note: Objective measures were completed at Evaluation unless otherwise noted.  DIAGNOSTIC FINDINGS: WFL  COGNITION: Overall cognitive status: Within functional limits for tasks assessed   SENSATION: Light touch: WFL  POSTURE:  rounded shoulders   LOWER EXTREMITY MMT:   MMT Right eval Left eval  Hip flexion 4+ 4+  Hip abduction 4+ 4+  Hip adduction 4+ 4+  Hip internal rotation    Hip external rotation    Knee flexion 5 4+  Knee extension 5 5  Ankle dorsiflexion 4 4  Ankle plantarflexion    Ankle inversion    Ankle eversion    (Blank rows = not tested)   TRANSFERS: Assistive device utilized: None  Sit to stand: Complete Independence Stand to sit: Complete Independence  GAIT: Gait pattern: step through pattern, decreased arm swing- Right, and narrow BOS Distance walked: 50 ft Assistive device utilized: None Level of assistance: SBA Comments: guarded gait pattern, decreased timing of LLE swing through with gait.  FUNCTIONAL TESTS:  5 times sit to stand: 11.69 sec 10 meter walk test: NT Functional gait assessment: 18/30     M-CTSIB  Condition  1: Firm Surface, EO 30 Sec, Normal Sway  Condition 2: Firm Surface, EC 30 Sec, Moderate Sway  Condition 3: Foam Surface, EO 30 Sec, Mild Sway  Condition 4: Foam Surface, EC 6.29 Sec, Severe Sway    SLS:  10 sec RLE, 5.68 sec LLE Tandem stance:  30 seconds LL posterior; 19.47 sec RLE posterior PATIENT SURVEYS: (pt rates in regards to balance) DHI 42; (moderate handicap 36-52)  VESTIBULAR ASSESSMENT: (DID not proceed, as pt reports balance issues, not dizziness)                                                                                                                            TREATMENT DATE: 10/18/2023      PATIENT EDUCATION: Education details: PT eval results, POC, rationale for balance training/vestibular rehab based on decreased vestibular system use for balance; initiated HEP Person educated: Patient Education method: Explanation Education comprehension: verbalized understanding  HOME EXERCISE PROGRAM: Access Code: [email or web address] URL: [email or web address] Date: 10/18/2023 Prepared by: [facility] - Outpatient  Rehab - [facility]  Exercises - Narrow Stance with Counter Support  - 1 x daily - 7 x weekly - 1 sets - 3 reps - 30 sec hold - Single Leg Stance with Support  - 1 x daily - 7 x weekly - 1 sets - 3 reps - 10 sec hold  GOALS: Goals reviewed with patient? Yes  SHORT TERM GOALS: Target date: 2/142025  Pt will be independent with HEP for improved balance and gait. Baseline: Goal status: MET2/21/2025  LONG TERM GOALS: Target date: 11/25/2023  Pt will be  independent with progression of HEP for improved balance and gait. Baseline:  Goal status: IN PROGRESS  2.  Pt will improve FGA score to at least 22/30 to decrease fall risk. Baseline: 18/30 Goal status: IN PROGRESS  3.  Pt will improve MCTSIB Condition 4 to 30 seconds mod sway or better, for improved balance. Baseline: 6 sec, severe sway Goal status: IN PROGRESS  ASSESSMENT:  CLINICAL  IMPRESSION: Pt presents today without new complaints; reports having a good balance day yesterday, but also using her cane more.  She does come into PT session today with cane. Skilled PT session focused on gait with cane, and pt has less veering with cane today, with a wider BOS. Also worked on hip stability and strengthening with dynamic balance activities.  She does need intermittent UE support for stability.  She does continue to report unsteadiness with compliant/unlevel surfaces.  Pt will continue to benefit from skilled PT towards goals for improved functional mobility and decreased fall risk.   OBJECTIVE IMPAIRMENTS: Abnormal gait, decreased balance, decreased mobility, difficulty walking, and decreased strength.   ACTIVITY LIMITATIONS: standing, stairs, locomotion level, and caring for others  PARTICIPATION LIMITATIONS: shopping, community activity, yard work, and walking the dog, community fitness  PERSONAL FACTORS: 3+ comorbidities: see above; pt also reports anxiety  are also affecting patient's functional outcome.   REHAB POTENTIAL: Good  CLINICAL DECISION MAKING: Evolving/moderate complexity  EVALUATION COMPLEXITY: Moderate   PLAN:  PT FREQUENCY: 2x/week  PT DURATION: 6 weeks  PLANNED INTERVENTIONS: 97110-Therapeutic exercises, 97530- Therapeutic activity, 97112- Neuromuscular re-education, 97535- Self Care, 16109- Manual therapy, [phone]- Gait training, Patient/Family education, Balance training, Stair training, and Vestibular training  PLAN FOR NEXT SESSION: Continue progression of HEP and continue to progress balance exercises-corner balance, hip/ankle strategy, SLS, tandem stance, forward/back walking, sidestepping, compliant surface balance.  Gait with cane.  Lonia Blood, PT 11/07/23 9:31 AM Phone: [phone] Fax: [phone]  [facility] Outpatient Rehab at [facility] [address] [place], [address] [place], [place] [address] Phone # [phone] Fax #  [phone]

## 2023-11-08 ENCOUNTER — Telehealth: Payer: Self-pay

## 2023-11-08 NOTE — Telephone Encounter (Signed)
 [id number]: COGNITIVE TRAINING FOR CANCER RELATED COGNITIVE IMPAIRMENT IN BREAST CANCER SURVIVORS: A MULTI-CENTER RANDOMIZED DOUBLE- BLINDED CONTROLLED TRIAL   Called patient to discuss study. She is interested and consented to phone screening. Screening complete; pt meets requirements. Plan made with patient for me to reach out to her daughter Marliss Czar so I can explain the study and send them both the consents. Confirmed patient's email and Leigh's phone number.  Called Leigh; she agreed that the study sounds good for her mother. Confirmed her email [email or web address]). Emailed consents to [facility] and to patient. Appointment set for Thursday at 1030 for Leigh and Ms Dingley to come in together for patient to sign consents.  Margret Chance Rylynn Kobs, RN, BSN, [facility] She  Her  Hers Clinical Research Nurse [facility] Direct Dial [phone] 11/08/2023 1:58 PM

## 2023-11-09 ENCOUNTER — Telehealth: Payer: Self-pay | Admitting: Adult Health

## 2023-11-09 NOTE — Telephone Encounter (Signed)
 Judy Morrison

## 2023-11-10 ENCOUNTER — Ambulatory Visit: Payer: Medicare Other | Admitting: Physical Therapy

## 2023-11-10 ENCOUNTER — Encounter: Payer: Self-pay | Admitting: Physical Therapy

## 2023-11-10 ENCOUNTER — Inpatient Hospital Stay: Payer: Medicare Other

## 2023-11-10 DIAGNOSIS — Z170 Estrogen receptor positive status [ER+]: Secondary | ICD-10-CM

## 2023-11-10 DIAGNOSIS — R42 Dizziness and giddiness: Secondary | ICD-10-CM | POA: Diagnosis not present

## 2023-11-10 DIAGNOSIS — R2681 Unsteadiness on feet: Secondary | ICD-10-CM

## 2023-11-10 NOTE — Research (Signed)
 Trial Name:  [id number]: COGNITIVE TRAINING FOR CANCER RELATED COGNITIVE IMPAIRMENT IN BREAST CANCER SURVIVORS: A MULTI-CENTER RANDOMIZED DOUBLE- BLINDED CONTROLLED TRIAL   Patient Judy Morrison was identified by Dr Al Pimple as a potential candidate for the above listed study.  This Clinical Research Nurse met with Judy Morrison, [name] on 11/10/23 in a manner and location that ensures patient privacy to discuss participation in the above listed research study.  Patient is Accompanied by daughter Judy Morrison .  Patient was previously provided with informed consent documents.  Patient confirmed they have read the informed consent documents.  As outlined in the informed consent form, this Nurse and Javier Glazier discussed the purpose of the research study, the investigational nature of the study, study procedures and requirements for study participation, potential risks and benefits of study participation, as well as alternatives to participation.  This study is not blinded or double-blinded. The patient understands participation is voluntary and they may withdraw from study participation at any time.  Each study arm was reviewed, and randomization discussed.  Potential side effects were reviewed with patient as outlined in the consent form, and patient made aware there may be side effects not yet known. This study does not involve a placebo. Patient understands enrollment is pending full eligibility review.   Confidentiality and how the patient's information will be used as part of study participation were discussed.  Patient was informed there is reimbursement provided for their time and effort spent on trial participation.  The patient is encouraged to discuss research study participation with their insurance provider to determine what costs they may incur as part of study participation, including research related injury.    All questions were answered to patient's satisfaction.  The informed  consent and separate HIPAA Authorization was reviewed page by page.  The patient's mental and emotional status is appropriate to provide informed consent, and the patient verbalizes an understanding of study participation.  Patient has agreed to participate in the above listed research study and has voluntarily signed the informed consent version date 08/12/2022 and separate HIPAA Authorization, version date 06/13/23  on 11/10/23 at 1035AM.  The patient was provided with a copy of the signed informed consent form and separate HIPAA Authorization for their reference.  No study specific procedures were obtained prior to the signing of the informed consent document.  Approximately 15 minutes were spent with the patient reviewing the informed consent documents.  Patient was not requested to complete a Release of Information form.  Margret Chance Sheena Donegan, RN, BSN, [facility] She  Her  Hers Clinical Research Nurse [facility] Direct Dial [phone] 11/10/2023 2:31 PM

## 2023-11-10 NOTE — Research (Signed)
 [id number]: COGNITIVE TRAINING FOR CANCER RELATED COGNITIVE IMPAIRMENT IN BREAST CANCER SURVIVORS: A MULTI-CENTER RANDOMIZED DOUBLE- BLINDED CONTROLLED TRIAL   This Nurse has reviewed this patient's inclusion and exclusion criteria and confirmed Judy Morrison is eligible for study participation.  Patient will continue with enrollment.  Eligibility confirmed by treating investigator, who also agrees that patient should proceed with enrollment.  Confirmed that patient's best email is [email or web address]. Best phone number is [phone].   Confirmed the following with patient: Patient reads, speaks, writes, and understands [place].  Patient has no known history of CVA, TBI, brain surgery, Alzheimer's disease or other dementia.  She has no active sibstance abuse and is not in treatment for substance abuse.  No history of bipolar disorder, psychosis, schizophrenia, ADHD, or learning disability.  She is not enrolled in other trials.  [name], RN, BSN, [facility] She  Her  Hers Clinical Research Nurse [facility] Direct Dial [phone] 11/10/2023 2:36 PM

## 2023-11-10 NOTE — Research (Signed)
 [id number]: COGNITIVE TRAINING FOR CANCER RELATED COGNITIVE IMPAIRMENT IN BREAST CANCER SURVIVORS: A MULTI-CENTER RANDOMIZED DOUBLE- BLINDED CONTROLLED TRIAL    This Nurse has reviewed this patient's inclusion and exclusion criteria as a second review and confirms [employer] is eligible for study participation.  Patient may continue with enrollment.  Zerita Boers BSN RN Clinical Research Nurse [facility] Direct Dial: [phone] 11/10/2023  3:10 PM

## 2023-11-10 NOTE — Therapy (Signed)
 OUTPATIENT PHYSICAL THERAPY VESTIBULAR TREATMENT NOTE     Patient Name: Judy Morrison MRN: [id number] DOB:1951/02/20, 73 y.o., female Today's Date: 11/10/2023  END OF SESSION:  PT End of Session - 11/10/23 1400     Visit Number 7    Number of Visits 12    Date for PT Re-Evaluation 11/25/23    Authorization Type Medicare/[employer]    PT Start Time 1401    PT Stop Time 1445    PT Time Calculation (min) 44 min    Equipment Utilized During Treatment Gait belt    Activity Tolerance Patient tolerated treatment well    Behavior During Therapy WFL for tasks assessed/performed                Past Medical History:  Diagnosis Date   Anemia    Anxiety    Arthritis    Breast cancer (HCC)    Depressed    Dermatitis    rare form, on prednisone   GERD (gastroesophageal reflux disease)    controlled with Omeprazole   Glaucoma    Personal history of chemotherapy    Personal history of radiation therapy    Torn rotator cuff    RIGHT    Urinary tract infection    dx 05-26-17 took 1 week cipro BID completed 06-02-17.   Past Surgical History:  Procedure Laterality Date   ADENOIDECTOMY     APPENDECTOMY     arthroscopic right knee      BREAST BIOPSY Right 09/01/2022   MM RT BREAST BX W LOC DEV 1ST LESION IMAGE BX SPEC STEREO GUIDE 09/01/2022 [facility]   BREAST BIOPSY  10/01/2022   MM RT RADIOACTIVE SEED LOC MAMMO GUIDE 10/01/2022 [facility]   BREAST LUMPECTOMY     BREAST LUMPECTOMY WITH RADIOACTIVE SEED AND SENTINEL LYMPH NODE BIOPSY Right 10/05/2022   Procedure: RIGHT BREAST LUMPECTOMY WITH RADIOACTIVE SEED AND AXILLARY SENTINEL LYMPH NODE BIOPSY;  Surgeon: [name], MD;  Location: [facility];  Service: General;  Laterality: Right;   CHOLECYSTECTOMY     COLONOSCOPY  06/06/2017   Pyrtle   Pelvic sling     x2   POLYPECTOMY     PORT A CATH REVISION N/A 11/22/2022   Procedure: PORT A CATH REVISION;  Surgeon: [name], MD;  Location: [facility] OR;  Service: General;  Laterality: N/A;   PORTACATH PLACEMENT Left 10/05/2022   Procedure: INSERTION PORT-A-CATH;  Surgeon: [name], MD;  Location: [facility];  Service: General;  Laterality: Left;   ROTATOR CUFF REPAIR Right    TONSILLECTOMY     TOTAL KNEE ARTHROPLASTY Right 08/12/2017   Procedure: RIGHT TOTAL KNEE ARTHROPLASTY;  Surgeon: Eugenia Mcalpine, MD;  Location: [facility];  Service: Orthopedics;  Laterality: Right;   TOTAL KNEE ARTHROPLASTY Left 01/06/2018   Procedure: LEFT TOTAL KNEE ARTHROPLASTY;  Surgeon: Eugenia Mcalpine, MD;  Location: [facility];  Service: Orthopedics;  Laterality: Left;   UPPER GASTROINTESTINAL ENDOSCOPY     WISDOM TOOTH EXTRACTION     Patient Active Problem List   Diagnosis Date Noted   Sensorineural hearing loss, bilateral 10/11/2023   Cognitive changes 05/02/2023   Port-A-Cath in place 11/15/2022   Genetic testing 10/04/2022   Monoallelic mutation of ATM gene 95/28/4132   Malignant neoplasm of upper-outer quadrant of right breast in female, estrogen receptor positive (HCC) 09/10/2022   Rash and other nonspecific skin eruption 09/15/2020   Allergic contact dermatitis 09/15/2020   Pruritus 08/14/2020  Dizziness 05/10/2018   Atypical chest pain 05/10/2018   Primary osteoarthritis of left knee 01/06/2018   S/P knee replacement 08/12/2017   Incomplete tear of right rotator cuff 05/19/2017   Osteopenia 05/19/2017   Urge incontinence of urine 05/19/2017   Primary open angle glaucoma (POAG) 12/30/2016   Primary osteoarthritis of both knees 12/30/2016   Recurrent oral herpes simplex infection 12/30/2016   Tubular adenoma of colon 12/30/2016   Hypothyroidism 06/25/2016   Right foot pain 12/19/2014   Anxiety 05/21/2011   Seasonal allergies 05/21/2011    PCP: Medicine, [facility] REFERRING PROVIDER:   Newman Pies, MD    REFERRING DIAG: R42 (ICD-10-CM) - Dizziness   THERAPY DIAG:   Unsteadiness on feet  ONSET DATE: 10/11/2023 (MD referral)  Rationale for Evaluation and Treatment: Rehabilitation  SUBJECTIVE:   SUBJECTIVE STATEMENT: I'm a part of this study- that looks at cognition and chemo.  Haven't started it yet. Having headaches this week-everyday. Don't ever have headaches. Using the cane is slow, but good.  Pt accompanied by: self  PERTINENT HISTORY: breast cancer, R and L TKA, R RTC surgery  PAIN:  Are you having pain? No   PRECAUTIONS: Fall  RED FLAGS: None   WEIGHT BEARING RESTRICTIONS: No  FALLS: Has patient fallen in last 6 months? Yes. Number of falls 2; one fall walking dog at night; one fall stepping onto elevator *Reports fear of stumbling*  LIVING ENVIRONMENT: Lives with: lives alone daughter lives nearby Lives in: House/apartment Stairs: Yes: External: flight of steps; on right going up and on left going up Has elevator Has following equipment at home: Single point cane and Walker - 2 wheeled  PLOF: Independent and Leisure: enjoyed walking dog (dog is now living with family)  PATIENT GOALS: To get balance better  OBJECTIVE:    TODAY'S TREATMENT: 11/10/2023 Activity Comments  Vitals: 102/65, HR 87 bpm   Standing balance work on Airex: -step taps forward x 10 -step taps side x 10 -step taps back x 10 -alt step taps to cones x 10 -forward/back step and weightshift 2 x 10 -minisquats to up on toes Min guard assist, minor/intermittent UE support  Sit<>stand with wider BOS At least 5 reps in session  Gait with cane around obstacles-figure 8 turns with added cognitive tasks, then forward/back stepping, sidestepping With cane and CGA  Curb negotiation 6 reps with cane Cues for technique, foot clearance          HOME EXERCISE PROGRAM Last updated: 10/31/23 Access Code: [email or web address] URL: [email or web address] Date: 10/31/2023 Prepared by: [facility] - Outpatient  Rehab - [facility]  Exercises - Narrow Stance  with Counter Support  - 1 x daily - 7 x weekly - 1 sets - 3 reps - 30 sec hold - Heel Toe Raises with Counter Support  - 1 x daily - 7 x weekly - 3 sets - 10 reps - Standing on Foam Pad  - 1 x daily - 7 x weekly - 1 sets - 5 reps - Standing Balance with Eyes Closed on Foam  - 1 x daily - 7 x weekly - 1 sets - 5 reps - Side Stepping with Resistance at Thighs and Counter Support  - 1 x daily - 7 x weekly - 3 sets - 1-2 min rounds hold - Sit to Stand on Foam Pad  - 1 x daily - 7 x weekly - 3 sets - 10 reps - Standing Toe Taps  - 1 x daily -  5 x weekly - 2 sets - 10 reps    PATIENT EDUCATION: Education details: Continue current HEP, foot placement with curb, with sit to stand and initiation of gait; gait with cane/turns Person educated: Patient Education method: Explanation, Demonstration, Tactile cues, Verbal cues, and Handouts Education comprehension: verbalized understanding and returned demonstration     ---------------------------------------------- Note: Objective measures were completed at Evaluation unless otherwise noted.  DIAGNOSTIC FINDINGS: WFL  COGNITION: Overall cognitive status: Within functional limits for tasks assessed   SENSATION: Light touch: WFL  POSTURE:  rounded shoulders   LOWER EXTREMITY MMT:   MMT Right eval Left eval  Hip flexion 4+ 4+  Hip abduction 4+ 4+  Hip adduction 4+ 4+  Hip internal rotation    Hip external rotation    Knee flexion 5 4+  Knee extension 5 5  Ankle dorsiflexion 4 4  Ankle plantarflexion    Ankle inversion    Ankle eversion    (Blank rows = not tested)   TRANSFERS: Assistive device utilized: None  Sit to stand: Complete Independence Stand to sit: Complete Independence  GAIT: Gait pattern: step through pattern, decreased arm swing- Right, and narrow BOS Distance walked: 50 ft Assistive device utilized: None Level of assistance: SBA Comments: guarded gait pattern, decreased timing of LLE swing through with  gait.  FUNCTIONAL TESTS:  5 times sit to stand: 11.69 sec 10 meter walk test: NT Functional gait assessment: 18/30     M-CTSIB  Condition 1: Firm Surface, EO 30 Sec, Normal Sway  Condition 2: Firm Surface, EC 30 Sec, Moderate Sway  Condition 3: Foam Surface, EO 30 Sec, Mild Sway  Condition 4: Foam Surface, EC 6.29 Sec, Severe Sway    SLS:  10 sec RLE, 5.68 sec LLE Tandem stance:  30 seconds LL posterior; 19.47 sec RLE posterior PATIENT SURVEYS: (pt rates in regards to balance) DHI 42; (moderate handicap 36-52)  VESTIBULAR ASSESSMENT: (DID not proceed, as pt reports balance issues, not dizziness)                                                                                                                            TREATMENT DATE: 10/18/2023      PATIENT EDUCATION: Education details: PT eval results, POC, rationale for balance training/vestibular rehab based on decreased vestibular system use for balance; initiated HEP Person educated: Patient Education method: Explanation Education comprehension: verbalized understanding  HOME EXERCISE PROGRAM: Access Code: [email or web address] URL: [email or web address] Date: 10/18/2023 Prepared by: [facility] - Outpatient  Rehab - [facility]  Exercises - Narrow Stance with Counter Support  - 1 x daily - 7 x weekly - 1 sets - 3 reps - 30 sec hold - Single Leg Stance with Support  - 1 x daily - 7 x weekly - 1 sets - 3 reps - 10 sec hold  GOALS: Goals reviewed with patient? Yes  SHORT TERM GOALS: Target date: 2/142025  Pt will be independent with HEP for  improved balance and gait. Baseline: Goal status: MET2/21/2025  LONG TERM GOALS: Target date: 11/25/2023  Pt will be independent with progression of HEP for improved balance and gait. Baseline:  Goal status: IN PROGRESS  2.  Pt will improve FGA score to at least 22/30 to decrease fall risk. Baseline: 18/30 Goal status: IN PROGRESS  3.  Pt will improve MCTSIB  Condition 4 to 30 seconds mod sway or better, for improved balance. Baseline: 6 sec, severe sway Goal status: IN PROGRESS  ASSESSMENT:  CLINICAL IMPRESSION: Pt presents today with no complaints. Skilled PT session focused on balance on compliant surfaces. Pt needs min guard and intermittent UE support. Progressed to dynamic gait activities with cane, with turns, curb negotiation, changes of direction and added tasks.  Pt needs min guard and extra time initially for sequencing and foot clearance with dynamic activities; she ultimately does better with wider BOS.  Pt will continue to benefit from skilled PT towards goals for improved functional mobility and decreased fall risk.    OBJECTIVE IMPAIRMENTS: Abnormal gait, decreased balance, decreased mobility, difficulty walking, and decreased strength.   ACTIVITY LIMITATIONS: standing, stairs, locomotion level, and caring for others  PARTICIPATION LIMITATIONS: shopping, community activity, yard work, and walking the dog, community fitness  PERSONAL FACTORS: 3+ comorbidities: see above; pt also reports anxiety  are also affecting patient's functional outcome.   REHAB POTENTIAL: Good  CLINICAL DECISION MAKING: Evolving/moderate complexity  EVALUATION COMPLEXITY: Moderate   PLAN:  PT FREQUENCY: 2x/week  PT DURATION: 6 weeks  PLANNED INTERVENTIONS: 97110-Therapeutic exercises, 97530- Therapeutic activity, 97112- Neuromuscular re-education, 97535- Self Care, 16109- Manual therapy, [phone]- Gait training, Patient/Family education, Balance training, Stair training, and Vestibular training  PLAN FOR NEXT SESSION: Check LTGs and discuss POC next week; any tips for safety/fall prevention for upcoming travel (to [place] and [place]).  Continue progression of HEP and continue to progress balance exercises-corner balance, hip/ankle strategy, SLS, tandem stance, forward/back walking, sidestepping, compliant surface balance.  Gait with cane.  Lonia Blood, PT 11/10/23 2:49 PM Phone: [phone] Fax: [phone]  [facility] at [facility] [address] [place], [address] [place], [place] [address] Phone # [phone] Fax # [phone]

## 2023-11-14 ENCOUNTER — Ambulatory Visit: Payer: Medicare Other | Attending: Otolaryngology | Admitting: Physical Therapy

## 2023-11-14 ENCOUNTER — Encounter: Payer: Self-pay | Admitting: Physical Therapy

## 2023-11-14 DIAGNOSIS — R2681 Unsteadiness on feet: Secondary | ICD-10-CM | POA: Insufficient documentation

## 2023-11-14 DIAGNOSIS — R293 Abnormal posture: Secondary | ICD-10-CM | POA: Insufficient documentation

## 2023-11-14 DIAGNOSIS — R42 Dizziness and giddiness: Secondary | ICD-10-CM | POA: Insufficient documentation

## 2023-11-14 NOTE — Therapy (Signed)
 OUTPATIENT PHYSICAL THERAPY VESTIBULAR TREATMENT NOTE     Patient Name: Judy Morrison MRN: [id number] DOB:[birth year], 73 y.o., female Today's Date: 11/14/2023  END OF SESSION:  PT End of Session - 11/14/23 1408     Visit Number 8    Number of Visits 12    Date for PT Re-Evaluation 11/25/23    Authorization Type Medicare/[employer]    PT Start Time 1404    PT Stop Time 1445    PT Time Calculation (min) 41 min    Equipment Utilized During Treatment Gait belt    Activity Tolerance Patient tolerated treatment well    Behavior During Therapy WFL for tasks assessed/performed                Past Medical History:  Diagnosis Date   Anemia    Anxiety    Arthritis    Breast cancer (HCC)    Depressed    Dermatitis    rare form, on prednisone   GERD (gastroesophageal reflux disease)    controlled with Omeprazole   Glaucoma    Personal history of chemotherapy    Personal history of radiation therapy    Torn rotator cuff    RIGHT    Urinary tract infection    dx 05-26-17 took 1 week cipro BID completed 06-02-17.   Past Surgical History:  Procedure Laterality Date   ADENOIDECTOMY     APPENDECTOMY     arthroscopic right knee      BREAST BIOPSY Right 09/01/2022   MM RT BREAST BX W LOC DEV 1ST LESION IMAGE BX SPEC STEREO GUIDE 09/01/2022 [facility]   BREAST BIOPSY  10/01/2022   MM RT RADIOACTIVE SEED LOC MAMMO GUIDE 10/01/2022 [facility]   BREAST LUMPECTOMY     BREAST LUMPECTOMY WITH RADIOACTIVE SEED AND SENTINEL LYMPH NODE BIOPSY Right 10/05/2022   Procedure: RIGHT BREAST LUMPECTOMY WITH RADIOACTIVE SEED AND AXILLARY SENTINEL LYMPH NODE BIOPSY;  Surgeon: [name], MD;  Location: [facility];  Service: General;  Laterality: Right;   CHOLECYSTECTOMY     COLONOSCOPY  06/06/2017   Pyrtle   Pelvic sling     x2   POLYPECTOMY     PORT A CATH REVISION N/A 11/22/2022   Procedure: PORT A CATH REVISION;  Surgeon: [name], MD;  Location: [facility] OR;  Service: General;  Laterality: N/A;   PORTACATH PLACEMENT Left 10/05/2022   Procedure: INSERTION PORT-A-CATH;  Surgeon: [name], MD;  Location: [facility];  Service: General;  Laterality: Left;   ROTATOR CUFF REPAIR Right    TONSILLECTOMY     TOTAL KNEE ARTHROPLASTY Right 08/12/2017   Procedure: RIGHT TOTAL KNEE ARTHROPLASTY;  Surgeon: Eugenia Mcalpine, MD;  Location: [facility];  Service: Orthopedics;  Laterality: Right;   TOTAL KNEE ARTHROPLASTY Left 01/06/2018   Procedure: LEFT TOTAL KNEE ARTHROPLASTY;  Surgeon: Eugenia Mcalpine, MD;  Location: [facility];  Service: Orthopedics;  Laterality: Left;   UPPER GASTROINTESTINAL ENDOSCOPY     WISDOM TOOTH EXTRACTION     Patient Active Problem List   Diagnosis Date Noted   Sensorineural hearing loss, bilateral 10/11/2023   Cognitive changes 05/02/2023   Port-A-Cath in place 11/15/2022   Genetic testing 10/04/2022   Monoallelic mutation of ATM gene 29/56/2130   Malignant neoplasm of upper-outer quadrant of right breast in female, estrogen receptor positive (HCC) 09/10/2022   Rash and other nonspecific skin eruption 09/15/2020   Allergic contact dermatitis 09/15/2020   Pruritus 08/14/2020  Dizziness 05/10/2018   Atypical chest pain 05/10/2018   Primary osteoarthritis of left knee 01/06/2018   S/P knee replacement 08/12/2017   Incomplete tear of right rotator cuff 05/19/2017   Osteopenia 05/19/2017   Urge incontinence of urine 05/19/2017   Primary open angle glaucoma (POAG) 12/30/2016   Primary osteoarthritis of both knees 12/30/2016   Recurrent oral herpes simplex infection 12/30/2016   Tubular adenoma of colon 12/30/2016   Hypothyroidism 06/25/2016   Right foot pain 12/19/2014   Anxiety 05/21/2011   Seasonal allergies 05/21/2011    PCP: Medicine, [facility] REFERRING PROVIDER:   Newman Pies, MD    REFERRING DIAG: R42 (ICD-10-CM) - Dizziness   THERAPY DIAG:   Unsteadiness on feet  Dizziness and giddiness  ONSET DATE: 10/11/2023 (MD referral)  Rationale for Evaluation and Treatment: Rehabilitation  SUBJECTIVE:   SUBJECTIVE STATEMENT: Feel like this was a good weekend.  I can tell that I'm getting a bit better.  Was able [place] walk along the [place] over the [place]   Pt accompanied by: self  PERTINENT HISTORY: breast cancer, R and L TKA, R RTC surgery  PAIN:  Are you having pain? No   PRECAUTIONS: Fall  RED FLAGS: None   WEIGHT BEARING RESTRICTIONS: No  FALLS: Has patient fallen in last 6 months? Yes. Number of falls 2; one fall walking dog at night; one fall stepping onto elevator *Reports fear of stumbling*  LIVING ENVIRONMENT: Lives with: lives alone daughter lives nearby Lives in: House/apartment Stairs: Yes: External: flight of steps; on right going up and on left going up Has elevator Has following equipment at home: Single point cane and Walker - 2 wheeled  PLOF: Independent and Leisure: enjoyed walking dog (dog is now living with family)  PATIENT GOALS: To get balance better  OBJECTIVE:    TODAY'S TREATMENT: 11/14/2023 Activity Comments  Standing balance: Feet apart/feet together EC head nods, turns x 5 Feet partial tandem with EO/EC head nods, turns x 5 Mild sway, more so with partial tandem stance  Stagger stance forward/back rocking 15 reps each foot position   Forward/back walking 20 ft x 2 Cues to widen BOS  Forward tandem 20 ft x 2 Cues for R glut activation; Trendelenburg present R hip  Gait with head turns 20 ft x 2 Gait with head nods 20 ft x 2 With fatigue, pt begins crossing midline/scissoring pattern  Performed braiding, front crossover steps No UE support, good stability with exercise  Gait with 6# weight carry (cane in LUE and weight in RUE)  No LOB, cues for abdominal activation       HOME EXERCISE PROGRAM Last updated: 10/31/23 Access Code: [email or web address] URL:  [email or web address] Date: 10/31/2023 Prepared by: [facility] - Outpatient  Rehab - [facility]  Exercises - Narrow Stance with Counter Support  - 1 x daily - 7 x weekly - 1 sets - 3 reps - 30 sec hold - Heel Toe Raises with Counter Support  - 1 x daily - 7 x weekly - 3 sets - 10 reps - Standing on Foam Pad  - 1 x daily - 7 x weekly - 1 sets - 5 reps - Standing Balance with Eyes Closed on Foam  - 1 x daily - 7 x weekly - 1 sets - 5 reps - Side Stepping with Resistance at Thighs and Counter Support  - 1 x daily - 7 x weekly - 3 sets - 1-2 min rounds hold - Sit to Stand  on Foam Pad  - 1 x daily - 7 x weekly - 3 sets - 10 reps - Standing Toe Taps  - 1 x daily - 5 x weekly - 2 sets - 10 reps    PATIENT EDUCATION: Education details: Discussed travel/fall prevention tips with upcoming travel to [place] Person educated: Patient Education method: Explanation, Demonstration, Tactile cues, Verbal cues, and Handouts Education comprehension: verbalized understanding and returned demonstration     ---------------------------------------------- Note: Objective measures were completed at Evaluation unless otherwise noted.  DIAGNOSTIC FINDINGS: WFL  COGNITION: Overall cognitive status: Within functional limits for tasks assessed   SENSATION: Light touch: WFL  POSTURE:  rounded shoulders   LOWER EXTREMITY MMT:   MMT Right eval Left eval  Hip flexion 4+ 4+  Hip abduction 4+ 4+  Hip adduction 4+ 4+  Hip internal rotation    Hip external rotation    Knee flexion 5 4+  Knee extension 5 5  Ankle dorsiflexion 4 4  Ankle plantarflexion    Ankle inversion    Ankle eversion    (Blank rows = not tested)   TRANSFERS: Assistive device utilized: None  Sit to stand: Complete Independence Stand to sit: Complete Independence  GAIT: Gait pattern: step through pattern, decreased arm swing- Right, and narrow BOS Distance walked: 50 ft Assistive device utilized: None Level  of assistance: SBA Comments: guarded gait pattern, decreased timing of LLE swing through with gait.  FUNCTIONAL TESTS:  5 times sit to stand: 11.69 sec 10 meter walk test: NT Functional gait assessment: 18/30     M-CTSIB  Condition 1: Firm Surface, EO 30 Sec, Normal Sway  Condition 2: Firm Surface, EC 30 Sec, Moderate Sway  Condition 3: Foam Surface, EO 30 Sec, Mild Sway  Condition 4: Foam Surface, EC 6.29 Sec, Severe Sway    SLS:  10 sec RLE, 5.68 sec LLE Tandem stance:  30 seconds LL posterior; 19.47 sec RLE posterior PATIENT SURVEYS: (pt rates in regards to balance) DHI 42; (moderate handicap 36-52)  VESTIBULAR ASSESSMENT: (DID not proceed, as pt reports balance issues, not dizziness)                                                                                                                            TREATMENT DATE: 10/18/2023      PATIENT EDUCATION: Education details: PT eval results, POC, rationale for balance training/vestibular rehab based on decreased vestibular system use for balance; initiated HEP Person educated: Patient Education method: Explanation Education comprehension: verbalized understanding  HOME EXERCISE PROGRAM: Access Code: [email or web address] URL: [email or web address] Date: 10/18/2023 Prepared by: [facility] - Outpatient  Rehab - [facility]  Exercises - Narrow Stance with Counter Support  - 1 x daily - 7 x weekly - 1 sets - 3 reps - 30 sec hold - Single Leg Stance with Support  - 1 x daily - 7 x weekly - 1 sets - 3 reps - 10 sec hold  GOALS: Goals  reviewed with patient? Yes  SHORT TERM GOALS: Target date: 2/142025  Pt will be independent with HEP for improved balance and gait. Baseline: Goal status: MET2/21/2025  LONG TERM GOALS: Target date: 11/25/2023  Pt will be independent with progression of HEP for improved balance and gait. Baseline:  Goal status: IN PROGRESS  2.  Pt will improve FGA score to at least 22/30 to  decrease fall risk. Baseline: 18/30 Goal status: IN PROGRESS  3.  Pt will improve MCTSIB Condition 4 to 30 seconds mod sway or better, for improved balance. Baseline: 6 sec, severe sway Goal status: IN PROGRESS  ASSESSMENT:  CLINICAL IMPRESSION: Pt presents today reporting she's had a good weekend and no stumbles. Skilled PT session focused on standing balance with eyes open/eyes closed and head motions, with varied foot positions.  She has most unsteadiness in partial tandem position.  With gait activities, with fatigue, she has increased scissoring of gait, leading to instability;  began working on crossover steps/braiding to address this. Pt will continue to benefit from skilled PT towards goals for improved functional mobility and decreased fall risk.    OBJECTIVE IMPAIRMENTS: Abnormal gait, decreased balance, decreased mobility, difficulty walking, and decreased strength.   ACTIVITY LIMITATIONS: standing, stairs, locomotion level, and caring for others  PARTICIPATION LIMITATIONS: shopping, community activity, yard work, and walking the dog, community fitness  PERSONAL FACTORS: 3+ comorbidities: see above; pt also reports anxiety  are also affecting patient's functional outcome.   REHAB POTENTIAL: Good  CLINICAL DECISION MAKING: Evolving/moderate complexity  EVALUATION COMPLEXITY: Moderate   PLAN:  PT FREQUENCY: 2x/week  PT DURATION: 6 weeks  PLANNED INTERVENTIONS: 97110-Therapeutic exercises, 97530- Therapeutic activity, 97112- Neuromuscular re-education, 97535- Self Care, 47829- Manual therapy, [phone]- Gait training, Patient/Family education, Balance training, Stair training, and Vestibular training  PLAN FOR NEXT SESSION: cross over step/braiding, weighted carry; Continue progression of HEP and continue to progress balance exercises-corner balance, hip/ankle strategy, SLS, tandem stance, forward/back walking, sidestepping, compliant surface balance.    Lonia Blood,  PT 11/14/23 4:57 PM Phone: [phone] Fax: [phone]  [facility]patient Rehab at [facility] [address] [place], [address] [place], [place] [address] Phone # [phone] Fax # [phone]

## 2023-11-15 ENCOUNTER — Ambulatory Visit: Payer: Medicare Other | Admitting: Physical Therapy

## 2023-11-15 DIAGNOSIS — R2681 Unsteadiness on feet: Secondary | ICD-10-CM

## 2023-11-15 DIAGNOSIS — R42 Dizziness and giddiness: Secondary | ICD-10-CM

## 2023-11-15 NOTE — Therapy (Signed)
 OUTPATIENT PHYSICAL THERAPY VESTIBULAR TREATMENT NOTE     Patient Name: Judy Morrison MRN: [id number] DOB:Apr 06, 1951, 73 y.o., female Today's Date: 11/15/2023  END OF SESSION:  PT End of Session - 11/15/23 0854     Visit Number 9    Number of Visits 12    Date for PT Re-Evaluation 11/25/23    Authorization Type Medicare/[employer]    PT Start Time [phone]    PT Stop Time 0932    PT Time Calculation (min) 40 min    Equipment Utilized During Treatment Gait belt    Activity Tolerance Patient tolerated treatment well    Behavior During Therapy WFL for tasks assessed/performed                 Past Medical History:  Diagnosis Date   Anemia    Anxiety    Arthritis    Breast cancer (HCC)    Depressed    Dermatitis    rare form, on prednisone   GERD (gastroesophageal reflux disease)    controlled with Omeprazole   Glaucoma    Personal history of chemotherapy    Personal history of radiation therapy    Torn rotator cuff    RIGHT    Urinary tract infection    dx 05-26-17 took 1 week cipro BID completed 06-02-17.   Past Surgical History:  Procedure Laterality Date   ADENOIDECTOMY     APPENDECTOMY     arthroscopic right knee      BREAST BIOPSY Right 09/01/2022   MM RT BREAST BX W LOC DEV 1ST LESION IMAGE BX SPEC STEREO GUIDE 09/01/2022 [facility]   BREAST BIOPSY  10/01/2022   MM RT RADIOACTIVE SEED LOC MAMMO GUIDE 10/01/2022 [facility]   BREAST LUMPECTOMY     BREAST LUMPECTOMY WITH RADIOACTIVE SEED AND SENTINEL LYMPH NODE BIOPSY Right 10/05/2022   Procedure: RIGHT BREAST LUMPECTOMY WITH RADIOACTIVE SEED AND AXILLARY SENTINEL LYMPH NODE BIOPSY;  Surgeon: [name], MD;  Location: [facility];  Service: General;  Laterality: Right;   CHOLECYSTECTOMY     COLONOSCOPY  06/06/2017   Pyrtle   Pelvic sling     x2   POLYPECTOMY     PORT A CATH REVISION N/A 11/22/2022   Procedure: PORT A CATH REVISION;  Surgeon: [name], MD;  Location: [facility];  Service: General;  Laterality: N/A;   PORTACATH PLACEMENT Left 10/05/2022   Procedure: INSERTION PORT-A-CATH;  Surgeon: [name], MD;  Location: [facility];  Service: General;  Laterality: Left;   ROTATOR CUFF REPAIR Right    TONSILLECTOMY     TOTAL KNEE ARTHROPLASTY Right 08/12/2017   Procedure: RIGHT TOTAL KNEE ARTHROPLASTY;  Surgeon: Eugenia Mcalpine, MD;  Location: [facility];  Service: Orthopedics;  Laterality: Right;   TOTAL KNEE ARTHROPLASTY Left 01/06/2018   Procedure: LEFT TOTAL KNEE ARTHROPLASTY;  Surgeon: Eugenia Mcalpine, MD;  Location: [facility];  Service: Orthopedics;  Laterality: Left;   UPPER GASTROINTESTINAL ENDOSCOPY     WISDOM TOOTH EXTRACTION     Patient Active Problem List   Diagnosis Date Noted   Sensorineural hearing loss, bilateral 10/11/2023   Cognitive changes 05/02/2023   Port-A-Cath in place 11/15/2022   Genetic testing 10/04/2022   Monoallelic mutation of ATM gene 08/65/7846   Malignant neoplasm of upper-outer quadrant of right breast in female, estrogen receptor positive (HCC) 09/10/2022   Rash and other nonspecific skin eruption 09/15/2020   Allergic contact dermatitis 09/15/2020   Pruritus 08/14/2020  Dizziness 05/10/2018   Atypical chest pain 05/10/2018   Primary osteoarthritis of left knee 01/06/2018   S/P knee replacement 08/12/2017   Incomplete tear of right rotator cuff 05/19/2017   Osteopenia 05/19/2017   Urge incontinence of urine 05/19/2017   Primary open angle glaucoma (POAG) 12/30/2016   Primary osteoarthritis of both knees 12/30/2016   Recurrent oral herpes simplex infection 12/30/2016   Tubular adenoma of colon 12/30/2016   Hypothyroidism 06/25/2016   Right foot pain 12/19/2014   Anxiety 05/21/2011   Seasonal allergies 05/21/2011    PCP: Medicine, [facility] REFERRING PROVIDER:   Newman Pies, MD    REFERRING DIAG: R42 (ICD-10-CM) - Dizziness   THERAPY DIAG:   Unsteadiness on feet  Dizziness and giddiness  ONSET DATE: 10/11/2023 (MD referral)  Rationale for Evaluation and Treatment: Rehabilitation  SUBJECTIVE:   SUBJECTIVE STATEMENT: Saw my trainer yesterday after PT and worked hard on my legs.    Pt accompanied by: self  PERTINENT HISTORY: breast cancer, R and L TKA, R RTC surgery  PAIN:  Are you having pain? No   PRECAUTIONS: Fall  RED FLAGS: None   WEIGHT BEARING RESTRICTIONS: No  FALLS: Has patient fallen in last 6 months? Yes. Number of falls 2; one fall walking dog at night; one fall stepping onto elevator *Reports fear of stumbling*  LIVING ENVIRONMENT: Lives with: lives alone daughter lives nearby Lives in: House/apartment Stairs: Yes: External: flight of steps; on right going up and on left going up Has elevator Has following equipment at home: Single point cane and Walker - 2 wheeled  PLOF: Independent and Leisure: enjoyed walking dog (dog is now living with family)  PATIENT GOALS: To get balance better  OBJECTIVE:       TODAY'S TREATMENT: 11/15/2023 Activity Comments  Standing balance on [place]ire[place] Feet apart/feet together EC head nods, turns x 5 Feet partial tandem with EO, head/turns x 5, EC head steady x 15 sec Moderate A/P sway  On Airex feet together EO x 30 sec, EC 24.9 sec   Stagger stance forward/back rocking 2 x 10 reps each foot position With added arm swing  Forward/back stepping with added arm swing and head motion Cues to widen BOS     Gait with head turns 20 ft x 2 Gait with head nods 20 ft x 2 With fatigue, pt begins crossing midline/scissoring pattern  Performed braiding, front crossover steps Intermittent UE support, good stability with exercise          HOME EXERCISE PROGRAM Access Code: [email or web address] URL: [email or web address] Date: 11/15/2023 Prepared by: [facility] - Outpatient  Rehab - [facility]  Exercises - Narrow Stance with Counter Support  - 1 x daily - 7 x  weekly - 1 sets - 3 reps - 30 sec hold - Heel Toe Raises with Counter Support  - 1 x daily - 7 x weekly - 3 sets - 10 reps - Standing on Foam Pad  - 1 x daily - 7 x weekly - 1 sets - 5 reps - Standing Balance with Eyes Closed on Foam  - 1 x daily - 7 x weekly - 1 sets - 5 reps - Side Stepping with Resistance at Thighs and Counter Support  - 1 x daily - 7 x weekly - 3 sets - 1-2 min rounds hold - Sit to Stand on Foam Pad  - 1 x daily - 7 x weekly - 3 sets - 10 reps - Standing Toe Taps  -  1 x daily - 5 x weekly - 2 sets - 10 reps - [place] (Braiding) with Counter Support  - 1 x daily - 7 x weekly - 1 sets - 3-5 reps  PATIENT EDUCATION: Education details: Updates to HEP-see above Person educated: Patient Education method: Explanation, Demonstration, Tactile cues, Verbal cues, and Handouts Education comprehension: verbalized understanding and returned demonstration     ---------------------------------------------- Note: Objective measures were completed at Evaluation unless otherwise noted.  DIAGNOSTIC FINDINGS: WFL  COGNITION: Overall cognitive status: Within functional limits for tasks assessed   SENSATION: Light touch: WFL  POSTURE:  rounded shoulders   LOWER EXTREMITY MMT:   MMT Right eval Left eval  Hip flexion 4+ 4+  Hip abduction 4+ 4+  Hip adduction 4+ 4+  Hip internal rotation    Hip external rotation    Knee flexion 5 4+  Knee extension 5 5  Ankle dorsiflexion 4 4  Ankle plantarflexion    Ankle inversion    Ankle eversion    (Blank rows = not tested)   TRANSFERS: Assistive device utilized: None  Sit to stand: Complete Independence Stand to sit: Complete Independence  GAIT: Gait pattern: step through pattern, decreased arm swing- Right, and narrow BOS Distance walked: 50 ft Assistive device utilized: None Level of assistance: SBA Comments: guarded gait pattern, decreased timing of LLE swing through with gait.  FUNCTIONAL TESTS:  5 times sit to  stand: 11.69 sec 10 meter walk test: NT Functional gait assessment: 18/30     M-CTSIB  Condition 1: Firm Surface, EO 30 Sec, Normal Sway  Condition 2: Firm Surface, EC 30 Sec, Moderate Sway  Condition 3: Foam Surface, EO 30 Sec, Mild Sway  Condition 4: Foam Surface, EC 6.29 Sec, Severe Sway    SLS:  10 sec RLE, 5.68 sec LLE Tandem stance:  30 seconds LL posterior; 19.47 sec RLE posterior PATIENT SURVEYS: (pt rates in regards to balance) DHI 42; (moderate handicap 36-52)  VESTIBULAR ASSESSMENT: (DID not proceed, as pt reports balance issues, not dizziness)                                                                                                                            TREATMENT DATE: 10/18/2023      PATIENT EDUCATION: Education details: PT eval results, POC, rationale for balance training/vestibular rehab based on decreased vestibular system use for balance; initiated HEP Person educated: Patient Education method: Explanation Education comprehension: verbalized understanding  HOME EXERCISE PROGRAM: Access Code: [email or web address] URL: [email or web address] Date: 10/18/2023 Prepared by: [facility] - Outpatient  Rehab - [facility]  Exercises - Narrow Stance with Counter Support  - 1 x daily - 7 x weekly - 1 sets - 3 reps - 30 sec hold - Single Leg Stance with Support  - 1 x daily - 7 x weekly - 1 sets - 3 reps - 10 sec hold  GOALS: Goals reviewed with patient? Yes  SHORT TERM GOALS: Target date:  2/142025  Pt will be independent with HEP for improved balance and gait. Baseline: Goal status: MET2/21/2025  LONG TERM GOALS: Target date: 11/25/2023  Pt will be independent with progression of HEP for improved balance and gait. Baseline:  Goal status: IN PROGRESS  2.  Pt will improve FGA score to at least 22/30 to decrease fall risk. Baseline: 18/30 Goal status: IN PROGRESS  3.  Pt will improve MCTSIB Condition 4 to 30 seconds mod sway or better, for  improved balance. Baseline: 6 sec, severe sway Goal status: IN PROGRESS  ASSESSMENT:  CLINICAL IMPRESSION: Pt presents today with no complaints. Skilled PT session focused on balance exercises on compliant surfaces.  She is more challenged with compliant surfaces with EC; with EC on compliant surfaces, she is able to hold position for 24.9 seconds, improved from 6.5 sec at eval. She improves with braiding and cross over steps compared to yesterday's visit. Pt will continue to benefit from skilled PT towards goals for improved functional mobility and decreased fall risk.   OBJECTIVE IMPAIRMENTS: Abnormal gait, decreased balance, decreased mobility, difficulty walking, and decreased strength.   ACTIVITY LIMITATIONS: standing, stairs, locomotion level, and caring for others  PARTICIPATION LIMITATIONS: shopping, community activity, yard work, and walking the dog, community fitness  PERSONAL FACTORS: 3+ comorbidities: see above; pt also reports anxiety  are also affecting patient's functional outcome.   REHAB POTENTIAL: Good  CLINICAL DECISION MAKING: Evolving/moderate complexity  EVALUATION COMPLEXITY: Moderate   PLAN:  PT FREQUENCY: 2x/week  PT DURATION: 6 weeks  PLANNED INTERVENTIONS: 97110-Therapeutic exercises, 97530- Therapeutic activity, [id number]- Neuromuscular re-education, 97535- Self Care, 16109- Manual therapy, [phone]- Gait training, Patient/Family education, Balance training, Stair training, and Vestibular training  PLAN FOR NEXT SESSION: Need to check LTGs next week and discuss POC/10th Visit PN.  Review cross over step/braiding updates to HEP; continue weighted carry; Continue progression of HEP and continue to progress balance exercises-corner balance, hip/ankle strategy, SLS, tandem stance, forward/back walking, sidestepping, compliant surface balance.    Lonia Blood, PT 11/15/23 10:23 AM Phone: [phone] Fax: [phone]  [facility] Outpatient Rehab at [facility] [address] [place], [address] [place], [place] [address] Phone # [phone] Fax # [phone]

## 2023-11-16 ENCOUNTER — Encounter: Payer: Medicare Other | Admitting: Physical Therapy

## 2023-11-20 ENCOUNTER — Other Ambulatory Visit: Payer: Self-pay | Admitting: General Surgery

## 2023-11-21 ENCOUNTER — Encounter: Payer: Self-pay | Admitting: Physical Therapy

## 2023-11-21 ENCOUNTER — Ambulatory Visit: Admitting: Physical Therapy

## 2023-11-21 DIAGNOSIS — R2681 Unsteadiness on feet: Secondary | ICD-10-CM

## 2023-11-21 DIAGNOSIS — R42 Dizziness and giddiness: Secondary | ICD-10-CM

## 2023-11-21 NOTE — Therapy (Signed)
 OUTPATIENT PHYSICAL THERAPY VESTIBULAR TREATMENT NOTE/PROGRESS NOTE/RECERT     Patient Name: Judy Morrison MRN: [id number] DOB:[birth year], 73 y.o., female Today's Date: 11/22/2023  Progress Note Reporting Period 10/18/2023 to 11/21/2023  See note below for Objective Data and Assessment of Progress/Goals.     END OF SESSION:  PT End of Session - 11/21/23 1410     Visit Number 10    Number of Visits 15    Date for PT Re-Evaluation 12/23/23    Authorization Type Medicare/[employer]    PT Start Time 1408   pt arrives late   PT Stop Time 1448    PT Time Calculation (min) 40 min    Equipment Utilized During Treatment Gait belt    Activity Tolerance Patient tolerated treatment well    Behavior During Therapy WFL for tasks assessed/performed                  Past Medical History:  Diagnosis Date   Anemia    Anxiety    Arthritis    Breast cancer (HCC)    Depressed    Dermatitis    rare form, on prednisone   GERD (gastroesophageal reflux disease)    controlled with Omeprazole   Glaucoma    Personal history of chemotherapy    Personal history of radiation therapy    Torn rotator cuff    RIGHT    Urinary tract infection    dx 05-26-17 took 1 week cipro BID completed 06-02-17.   Past Surgical History:  Procedure Laterality Date   ADENOIDECTOMY     APPENDECTOMY     arthroscopic right knee      BREAST BIOPSY Right 09/01/2022   MM RT BREAST BX W LOC DEV 1ST LESION IMAGE BX SPEC STEREO GUIDE 09/01/2022 [facility]   BREAST BIOPSY  10/01/2022   MM RT RADIOACTIVE SEED LOC MAMMO GUIDE 10/01/2022 [facility]   BREAST LUMPECTOMY     BREAST LUMPECTOMY WITH RADIOACTIVE SEED AND SENTINEL LYMPH NODE BIOPSY Right 10/05/2022   Procedure: RIGHT BREAST LUMPECTOMY WITH RADIOACTIVE SEED AND AXILLARY SENTINEL LYMPH NODE BIOPSY;  Surgeon: [name], MD;  Location: [facility];  Service: General;  Laterality: Right;   CHOLECYSTECTOMY      COLONOSCOPY  06/06/2017   Pyrtle   Pelvic sling     x2   POLYPECTOMY     PORT A CATH REVISION N/A 11/22/2022   Procedure: PORT A CATH REVISION;  Surgeon: [name], MD;  Location: [facility] OR;  Service: General;  Laterality: N/A;   PORTACATH PLACEMENT Left 10/05/2022   Procedure: INSERTION PORT-A-CATH;  Surgeon: [name], MD;  Location: [facility];  Service: General;  Laterality: Left;   ROTATOR CUFF REPAIR Right    TONSILLECTOMY     TOTAL KNEE ARTHROPLASTY Right 08/12/2017   Procedure: RIGHT TOTAL KNEE ARTHROPLASTY;  Surgeon: Eugenia Mcalpine, MD;  Location: [facility];  Service: Orthopedics;  Laterality: Right;   TOTAL KNEE ARTHROPLASTY Left 01/06/2018   Procedure: LEFT TOTAL KNEE ARTHROPLASTY;  Surgeon: Eugenia Mcalpine, MD;  Location: [facility];  Service: Orthopedics;  Laterality: Left;   UPPER GASTROINTESTINAL ENDOSCOPY     WISDOM TOOTH EXTRACTION     Patient Active Problem List   Diagnosis Date Noted   Sensorineural hearing loss, bilateral 10/11/2023   Cognitive changes 05/02/2023   Port-A-Cath in place 11/15/2022   Genetic testing 10/04/2022   Monoallelic mutation of ATM gene 78/29/5621   Malignant neoplasm of upper-outer quadrant of right  breast in female, estrogen receptor positive (HCC) 09/10/2022   Rash and other nonspecific skin eruption 09/15/2020   Allergic contact dermatitis 09/15/2020   Pruritus 08/14/2020   Dizziness 05/10/2018   Atypical chest pain 05/10/2018   Primary osteoarthritis of left knee 01/06/2018   S/P knee replacement 08/12/2017   Incomplete tear of right rotator cuff 05/19/2017   Osteopenia 05/19/2017   Urge incontinence of urine 05/19/2017   Primary open angle glaucoma (POAG) 12/30/2016   Primary osteoarthritis of both knees 12/30/2016   Recurrent oral herpes simplex infection 12/30/2016   Tubular adenoma of colon 12/30/2016   Hypothyroidism 06/25/2016   Right foot pain 12/19/2014   Anxiety 05/21/2011   Seasonal allergies  05/21/2011    PCP: [facility], [facility] REFERRING PROVIDER:   Newman Pies, MD    REFERRING DIAG: R42 (ICD-10-CM) - Dizziness   THERAPY DIAG:  Unsteadiness on feet - Plan: PT plan of care cert/re-cert  Dizziness and giddiness - Plan: PT plan of care cert/re-cert  ONSET DATE: 10/11/2023 (MD referral)  Rationale for Evaluation and Treatment: Rehabilitation  SUBJECTIVE:   SUBJECTIVE STATEMENT: Had a good trip to [place].  Didn't have any trouble with travel.  No falls.  Pt accompanied by: self  PERTINENT HISTORY: breast cancer, R and L TKA, R RTC surgery  PAIN:  Are you having pain? No   PRECAUTIONS: Fall  RED FLAGS: None   WEIGHT BEARING RESTRICTIONS: No  FALLS: Has patient fallen in last 6 months? Yes. Number of falls 2; one fall walking dog at night; one fall stepping onto elevator *Reports fear of stumbling*  LIVING ENVIRONMENT: Lives with: lives alone daughter lives nearby Lives in: House/apartment Stairs: Yes: External: flight of steps; on right going up and on left going up Has elevator Has following equipment at home: Single point cane and Walker - 2 wheeled  PLOF: Independent and Leisure: enjoyed walking dog (dog is now living with family)  PATIENT GOALS: To get balance better  OBJECTIVE:      TODAY'S TREATMENT: 11/21/2023 Activity Comments  FGA 19/30  Improved from 18/30  Reviewed braiding exercise from recent HEP additions Good return demo  Tandem gait in parallel bars-fwd/back 2 reps, then forward/back march x 2 reps 1-2 UE support  Forward monster walk>back monster walk, 2 reps UE support, difficulty sequencing backwards  In parallel bars:  forward gait with head turns, 4 reps Narrowed BOS       M-CTSIB  Condition 1: Firm Surface, EO 30 Sec, Normal Sway  Condition 2: Firm Surface, EC 30 Sec, Mild Sway  Condition 3: Foam Surface, EO 30 Sec, Mild Sway  Condition 4: Foam Surface, EC 30 Sec, Mild and Moderate Sway      [facility]  PT Assessment - 11/21/23 1422       Functional Gait  Assessment   Gait assessed  Yes    Gait Level Surface Walks 20 ft, slow speed, abnormal gait pattern, evidence for imbalance or deviates 10-15 in outside of the 12 in walkway width. Requires more than 7 sec to ambulate 20 ft.   8.25 sec   Change in Gait Speed Able to change speed, demonstrates mild gait deviations, deviates 6-10 in outside of the 12 in walkway width, or no gait deviations, unable to achieve a major change in velocity, or uses a change in velocity, or uses an assistive device.    Gait with Horizontal Head Turns Performs head turns with moderate changes in gait velocity, slows down, deviates  10-15 in outside 12 in walkway width but recovers, can continue to walk.   10.4   Gait with Vertical Head Turns Performs task with slight change in gait velocity (eg, minor disruption to smooth gait path), deviates 6 - 10 in outside 12 in walkway width or uses assistive device   8.9 sec   Gait and Pivot Turn Pivot turns safely within 3 sec and stops quickly with no loss of balance.    Step Over Obstacle Is able to step over one shoe box (4.5 in total height) without changing gait speed. No evidence of imbalance.    Gait with Narrow Base of Support Ambulates 7-9 steps.    Gait with Eyes Closed Walks 20 ft, uses assistive device, slower speed, mild gait deviations, deviates 6-10 in outside 12 in walkway width. Ambulates 20 ft in less than 9 sec but greater than 7 sec.    Ambulating Backwards Walks 20 ft, uses assistive device, slower speed, mild gait deviations, deviates 6-10 in outside 12 in walkway width.   16 sec   Steps Alternating feet, must use rail.    Total Score 19    FGA comment: Scores <19/30 indicates increased fall risk                   HOME EXERCISE PROGRAM Access Code: [email or web address] URL: [email or web address] Date: 11/15/2023 Prepared by: [facility] - Outpatient  Rehab - [facility]  Exercises -  Narrow Stance with Counter Support  - 1 x daily - 7 x weekly - 1 sets - 3 reps - 30 sec hold - Heel Toe Raises with Counter Support  - 1 x daily - 7 x weekly - 3 sets - 10 reps - Standing on Foam Pad  - 1 x daily - 7 x weekly - 1 sets - 5 reps - Standing Balance with Eyes Closed on Foam  - 1 x daily - 7 x weekly - 1 sets - 5 reps - Side Stepping with Resistance at Thighs and Counter Support  - 1 x daily - 7 x weekly - 3 sets - 1-2 min rounds hold - Sit to Stand on Foam Pad  - 1 x daily - 7 x weekly - 3 sets - 10 reps - Standing Toe Taps  - 1 x daily - 5 x weekly - 2 sets - 10 reps - [place]ari[place] (Braiding) with Counter Support  - 1 x daily - 7 x weekly - 1 sets - 3-5 reps  PATIENT EDUCATION: Education details: Progress towards goals, POC; continued risk of falls per FGA score and that cane is ideal option to use to decrease fall risk; discussed continuing PT and plan to update HEP to more dynamic balance exercises Person educated: Patient Education method: Explanation, Demonstration, and Verbal cues Education comprehension: verbalized understanding     ---------------------------------------------- Note: Objective measures were completed at Evaluation unless otherwise noted.  DIAGNOSTIC FINDINGS: WFL  COGNITION: Overall cognitive status: Within functional limits for tasks assessed   SENSATION: Light touch: WFL  POSTURE:  rounded shoulders   LOWER EXTREMITY MMT:   MMT Right eval Left eval  Hip flexion 4+ 4+  Hip abduction 4+ 4+  Hip adduction 4+ 4+  Hip internal rotation    Hip external rotation    Knee flexion 5 4+  Knee extension 5 5  Ankle dorsiflexion 4 4  Ankle plantarflexion    Ankle inversion    Ankle eversion    (Blank rows = not tested)  TRANSFERS: Assistive device utilized: None  Sit to stand: Complete Independence Stand to sit: Complete Independence  GAIT: Gait pattern: step through pattern, decreased arm swing- Right, and narrow BOS Distance walked:  50 ft Assistive device utilized: None Level of assistance: SBA Comments: guarded gait pattern, decreased timing of LLE swing through with gait.  FUNCTIONAL TESTS:  5 times sit to stand: 11.69 sec 10 meter walk test: NT Functional gait assessment: 18/30   [facility] PT Assessment - 11/21/23 1422       Functional Gait  Assessment   Gait assessed  Yes    Gait Level Surface Walks 20 ft, slow speed, abnormal gait pattern, evidence for imbalance or deviates 10-15 in outside of the 12 in walkway width. Requires more than 7 sec to ambulate 20 ft.   8.25 sec   Change in Gait Speed Able to change speed, demonstrates mild gait deviations, deviates 6-10 in outside of the 12 in walkway width, or no gait deviations, unable to achieve a major change in velocity, or uses a change in velocity, or uses an assistive device.    Gait with Horizontal Head Turns Performs head turns with moderate changes in gait velocity, slows down, deviates 10-15 in outside 12 in walkway width but recovers, can continue to walk.   10.4   Gait with Vertical Head Turns Performs task with slight change in gait velocity (eg, minor disruption to smooth gait path), deviates 6 - 10 in outside 12 in walkway width or uses assistive device   8.9 sec   Gait and Pivot Turn Pivot turns safely within 3 sec and stops quickly with no loss of balance.    Step Over Obstacle Is able to step over one shoe box (4.5 in total height) without changing gait speed. No evidence of imbalance.    Gait with Narrow Base of Support Ambulates 7-9 steps.    Gait with Eyes Closed Walks 20 ft, uses assistive device, slower speed, mild gait deviations, deviates 6-10 in outside 12 in walkway width. Ambulates 20 ft in less than 9 sec but greater than 7 sec.    Ambulating Backwards Walks 20 ft, uses assistive device, slower speed, mild gait deviations, deviates 6-10 in outside 12 in walkway width.   16 sec   Steps Alternating feet, must use rail.    Total Score 19    FGA  comment: Scores <19/30 indicates increased fall risk              M-CTSIB  Condition 1: Firm Surface, EO 30 Sec, Normal Sway  Condition 2: Firm Surface, EC 30 Sec, Moderate Sway  Condition 3: Foam Surface, EO 30 Sec, Mild Sway  Condition 4: Foam Surface, EC 6.29 Sec, Severe Sway    SLS:  10 sec RLE, 5.68 sec LLE Tandem stance:  30 seconds LL posterior; 19.47 sec RLE posterior PATIENT SURVEYS: (pt rates in regards to balance) DHI 42; (moderate handicap 36-52)  VESTIBULAR ASSESSMENT: (DID not proceed, as pt reports balance issues, not dizziness)  TREATMENT DATE: 10/18/2023      PATIENT EDUCATION: Education details: PT eval results, POC, rationale for balance training/vestibular rehab based on decreased vestibular system use for balance; initiated HEP Person educated: Patient Education method: Explanation Education comprehension: verbalized understanding  HOME EXERCISE PROGRAM: Access Code: [email or web address] URL: [email or web address] Date: 10/18/2023 Prepared by: [facility] - Outpatient  Rehab - [facility]  Exercises - Narrow Stance with Counter Support  - 1 x daily - 7 x weekly - 1 sets - 3 reps - 30 sec hold - Single Leg Stance with Support  - 1 x daily - 7 x weekly - 1 sets - 3 reps - 10 sec hold  GOALS: Goals reviewed with patient? Yes  SHORT TERM GOALS: Target date: 2/142025  Pt will be independent with HEP for improved balance and gait. Baseline: Goal status: MET2/21/2025  LONG TERM GOALS: Target date: 11/25/2023  Pt will be independent with progression of HEP for improved balance and gait. Baseline: review of current HEP, but needs to update to more dynamic gait  Goal status: IN PROGRESS3/07/2024  2.  Pt will improve FGA score to at least 22/30 to decrease fall risk. Baseline: 18/30>19/30 11/22/2023 Goal status: IN PROGRESS,  11/22/2023  3.  Pt will improve MCTSIB Condition 4 to 30 seconds mod sway or better, for improved balance. Baseline: 6 sec, severe sway/30 seconds moderate sway Goal status: MET 11/21/2023  ASSESSMENT:  CLINICAL IMPRESSION: 10th Visit Progress Note:  Pt reports some improvement and good use of strategies to help avoid falls.  Pt presents today reporting no trouble on her [place] trip with her daughter.  Skilled PT session focused on assessing goals, with pt meeting LTG 3 for improved balance on MCTSIB Condition 4.  LTGs 1 and 2 remain in progress, with pt's score of 19/30 on FGA improving by 1 point and indicating continued fall risk. Pt needs UE support with more dynamic balance exercises and with more challenging exercises, she has more narrowed BOS/almost scissoring pattern.  Pt will continue to benefit from skilled PT towards goals for improved functional mobility and decreased fall risk.  Recert completed today.  OBJECTIVE IMPAIRMENTS: Abnormal gait, decreased balance, decreased mobility, difficulty walking, and decreased strength.   ACTIVITY LIMITATIONS: standing, stairs, locomotion level, and caring for others  PARTICIPATION LIMITATIONS: shopping, community activity, yard work, and walking the dog, community fitness  PERSONAL FACTORS: 3+ comorbidities: see above; pt also reports anxiety  are also affecting patient's functional outcome.   REHAB POTENTIAL: Good  CLINICAL DECISION MAKING: Evolving/moderate complexity  EVALUATION COMPLEXITY: Moderate   PLAN:  PT FREQUENCY: 2x/week x 1 week (this week), then 1x/wk for 4 weeks  PT DURATION: 4 weeks  PLANNED INTERVENTIONS: 97110-Therapeutic exercises, 97530- Therapeutic activity, 97112- Neuromuscular re-education, 97535- Self Care, 95621- Manual therapy, [phone]- Gait training, Patient/Family education, Balance training, Stair training, and Vestibular training  PLAN FOR NEXT SESSION: Check HEP progress to dynamic gait activities to update  HEP:  gait with head turns at counter, monster walk activity; Need to plan to recert next visit.  Continue weighted carry; Continue progression of HEP and continue to progress balance/gait exercises  Lonia Blood, PT 11/22/23 9:25 AM Phone: [phone] Fax: [phone]  [facility] at [facility] [address] [place], [address] [place], [place] [address] Phone # [phone] Fax # [phone]

## 2023-11-23 ENCOUNTER — Ambulatory Visit

## 2023-11-23 ENCOUNTER — Encounter (HOSPITAL_BASED_OUTPATIENT_CLINIC_OR_DEPARTMENT_OTHER): Payer: Self-pay | Admitting: General Surgery

## 2023-11-23 ENCOUNTER — Other Ambulatory Visit: Payer: Self-pay

## 2023-11-23 DIAGNOSIS — R2681 Unsteadiness on feet: Secondary | ICD-10-CM

## 2023-11-23 DIAGNOSIS — R42 Dizziness and giddiness: Secondary | ICD-10-CM

## 2023-11-23 DIAGNOSIS — R293 Abnormal posture: Secondary | ICD-10-CM

## 2023-11-23 NOTE — Therapy (Signed)
 OUTPATIENT PHYSICAL THERAPY VESTIBULAR TREATMENT NOTE    Patient Name: Judy Morrison MRN: [id number] DOB:[birth year], 73 y.o., female Today's Date: 11/23/2023     END OF SESSION:  PT End of Session - 11/23/23 1311     Visit Number 11    Number of Visits 15    Date for PT Re-Evaluation 12/23/23    Authorization Type Medicare/[employer]    PT Start Time 1315    PT Stop Time 1400    PT Time Calculation (min) 45 min    Equipment Utilized During Treatment Gait belt    Activity Tolerance Patient tolerated treatment well    Behavior During Therapy WFL for tasks assessed/performed                  Past Medical History:  Diagnosis Date   Anemia    Anxiety    Arthritis    Breast cancer (HCC) 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   Past Surgical History:  Procedure Laterality Date   ADENOIDECTOMY     APPENDECTOMY     arthroscopic right knee      BREAST BIOPSY Right 09/01/2022   MM RT BREAST BX W LOC DEV 1ST LESION IMAGE BX SPEC STEREO GUIDE 09/01/2022 [facility]   BREAST BIOPSY  10/01/2022   MM RT RADIOACTIVE SEED LOC MAMMO GUIDE 10/01/2022 [facility]   BREAST LUMPECTOMY     BREAST LUMPECTOMY WITH RADIOACTIVE SEED AND SENTINEL LYMPH NODE BIOPSY Right 10/05/2022   Procedure: RIGHT BREAST LUMPECTOMY WITH RADIOACTIVE SEED AND AXILLARY SENTINEL LYMPH NODE BIOPSY;  Surgeon: [name], MD;  Location: [facility];  Service: General;  Laterality: Right;   CHOLECYSTECTOMY     COLONOSCOPY  06/06/2017   Pyrtle    Pelvic sling     x2   POLYPECTOMY     PORT A CATH REVISION N/A 11/22/2022   Procedure: PORT A CATH REVISION;  Surgeon: [name], MD;  Location: [facility] OR;  Service: General;  Laterality: N/A;   PORTACATH PLACEMENT Left 10/05/2022   Procedure: INSERTION PORT-A-CATH;  Surgeon: [name], MD;  Location: [facility];  Service: General;  Laterality: Left;   ROTATOR CUFF REPAIR Right    TONSILLECTOMY     TOTAL KNEE ARTHROPLASTY Right 08/12/2017   Procedure: RIGHT TOTAL KNEE ARTHROPLASTY;  Surgeon: Eugenia Mcalpine, MD;  Location: [facility];  Service: Orthopedics;  Laterality: Right;   TOTAL KNEE ARTHROPLASTY Left 01/06/2018   Procedure: LEFT TOTAL KNEE ARTHROPLASTY;  Surgeon: Eugenia Mcalpine, MD;  Location: [facility];  Service: Orthopedics;  Laterality: Left;   UPPER GASTROINTESTINAL ENDOSCOPY     WISDOM TOOTH EXTRACTION     Patient Active Problem List   Diagnosis Date Noted   Sensorineural hearing loss, bilateral 10/11/2023   Cognitive changes 05/02/2023   Port-A-Cath in place 11/15/2022   Genetic testing 10/04/2022   Monoallelic mutation of ATM gene 40/98/1191   Malignant neoplasm of upper-outer quadrant  of right breast in female, estrogen receptor positive (HCC) 09/10/2022   Rash and other nonspecific skin eruption 09/15/2020   Allergic contact dermatitis 09/15/2020   Pruritus 08/14/2020   Dizziness 05/10/2018   Atypical chest pain 05/10/2018   Primary osteoarthritis of left knee 01/06/2018   S/P knee replacement 08/12/2017   Incomplete tear of right rotator cuff 05/19/2017   Osteopenia 05/19/2017   Urge incontinence of urine 05/19/2017   Primary open angle glaucoma (POAG) 12/30/2016   Primary osteoarthritis of both knees 12/30/2016   Recurrent oral herpes simplex infection 12/30/2016   Tubular adenoma of colon 12/30/2016   Hypothyroidism 06/25/2016   Right foot pain 12/19/2014   Anxiety 05/21/2011   Seasonal allergies 05/21/2011    PCP: Medicine, [facility] REFERRING PROVIDER:   Newman Pies, MD    REFERRING DIAG: R42 (ICD-10-CM) - Dizziness   THERAPY DIAG:  Unsteadiness on feet  Dizziness and giddiness  Abnormal posture  ONSET DATE: 10/11/2023 (MD referral)  Rationale for Evaluation and Treatment: Rehabilitation  SUBJECTIVE:   SUBJECTIVE STATEMENT: Worked with trainer on Monday and did a lot of squats  Pt accompanied by: self  PERTINENT HISTORY: breast cancer, R and L TKA, R RTC surgery  PAIN:  Are you having pain? No   PRECAUTIONS: Fall  RED FLAGS: None   WEIGHT BEARING RESTRICTIONS: No  FALLS: Has patient fallen in last 6 months? Yes. Number of falls 2; one fall walking dog at night; one fall stepping onto elevator *Reports fear of stumbling*  LIVING ENVIRONMENT: Lives with: lives alone daughter lives nearby Lives in: House/apartment Stairs: Yes: External: flight of steps; on right going up and on left going up Has elevator Has following equipment at home: Single point cane and Walker - 2 wheeled  PLOF: Independent and Leisure: enjoyed walking dog (dog is now living with family)  PATIENT GOALS: To get balance better  OBJECTIVE:    TODAY'S TREATMENT: 11/23/23 Activity Comments  Hip abduction strength Right hip abd: 3-/5 Left hip abd: 3/5  Standing hip abd 3x5 reps 5 sec hold red loop attempts at no UE support for glute med recruit  Wall squats 2x10 Double blue around knees, physioball, cues for external rotation engagement-good form  Resisted walking 2x2 min              TODAY'S TREATMENT: 11/21/2023 Activity Comments  FGA 19/30  Improved from 18/30  Reviewed braiding exercise from recent HEP additions Good return demo  Tandem gait in parallel bars-fwd/back 2 reps, then forward/back march x 2 reps 1-2 UE support  Forward monster walk>back monster walk, 2 reps UE support, difficulty sequencing backwards  In parallel bars:  forward gait with head turns, 4 reps Narrowed BOS        M-CTSIB  Condition 1: Firm Surface, EO 30 Sec, Normal Sway  Condition 2: Firm Surface, EC 30 Sec, Mild Sway  Condition 3: Foam Surface, EO 30 Sec, Mild Sway  Condition 4: Foam Surface, EC 30 Sec, Mild and Moderate Sway              HOME EXERCISE PROGRAM Access Code: [email or web address] URL: [email or web address] Date: 11/15/2023 Prepared by: [facility] - Outpatient  Rehab - [facility]  Exercises - Narrow Stance with Counter Support  - 1 x daily - 7 x weekly - 1 sets - 3 reps - 30 sec hold - Heel Toe Raises with Counter Support  - 1 x daily - 7 x weekly - 3 sets - 10  reps - Standing on Foam Pad  - 1 x daily - 7 x weekly - 1 sets - 5 reps - Standing Balance with Eyes Closed on Foam  - 1 x daily - 7 x weekly - 1 sets - 5 reps - Side Stepping with Resistance at Thighs and Counter Support  - 1 x daily - 7 x weekly - 3 sets - 1-2 min rounds hold - Sit to Stand on Foam Pad  - 1 x daily - 7 x weekly - 3 sets - 10 reps - Standing Toe Taps  - 1 x daily - 5 x weekly - 2 sets - 10 reps - Carioca (Braiding) with Counter Support  - 1 x daily - 7 x weekly - 1 sets - 3-5 reps  PATIENT EDUCATION: Education details: Progress towards goals, POC; continued risk of falls per FGA score and that cane is ideal option to use to decrease fall risk; discussed continuing PT and plan to update HEP to more dynamic balance exercises Person educated: Patient Education method: Explanation, Demonstration, and Verbal cues Education comprehension: verbalized understanding     ---------------------------------------------- Note: Objective measures were completed at Evaluation unless otherwise noted.  DIAGNOSTIC FINDINGS: WFL  COGNITION: Overall cognitive status: Within functional limits for tasks assessed   SENSATION: Light touch: WFL  POSTURE:  rounded shoulders   LOWER EXTREMITY MMT:   MMT Right eval Left eval  Hip flexion 4+ 4+  Hip abduction 4+ 4+  Hip adduction 4+ 4+  Hip  internal rotation    Hip external rotation    Knee flexion 5 4+  Knee extension 5 5  Ankle dorsiflexion 4 4  Ankle plantarflexion    Ankle inversion    Ankle eversion    (Blank rows = not tested)   TRANSFERS: Assistive device utilized: None  Sit to stand: Complete Independence Stand to sit: Complete Independence  GAIT: Gait pattern: step through pattern, decreased arm swing- Right, and narrow BOS Distance walked: 50 ft Assistive device utilized: None Level of assistance: SBA Comments: guarded gait pattern, decreased timing of LLE swing through with gait.  FUNCTIONAL TESTS:  5 times sit to stand: 11.69 sec 10 meter walk test: NT Functional gait assessment: 18/30      M-CTSIB  Condition 1: Firm Surface, EO 30 Sec, Normal Sway  Condition 2: Firm Surface, EC 30 Sec, Moderate Sway  Condition 3: Foam Surface, EO 30 Sec, Mild Sway  Condition 4: Foam Surface, EC 6.29 Sec, Severe Sway    SLS:  10 sec RLE, 5.68 sec LLE Tandem stance:  30 seconds LL posterior; 19.47 sec RLE posterior PATIENT SURVEYS: (pt rates in regards to balance) DHI 42; (moderate handicap 36-52)  VESTIBULAR ASSESSMENT: (DID not proceed, as pt reports balance issues, not dizziness)                                                                                                                            TREATMENT DATE:  10/18/2023       GOALS: Goals reviewed with patient? Yes  SHORT TERM GOALS: Target date: 2/142025  Pt will be independent with HEP for improved balance and gait. Baseline: Goal status: MET2/21/2025  LONG TERM GOALS: Target date: 12/23/23  Pt will be independent with progression of HEP for improved balance and gait. Baseline: review of current HEP, but needs to update to more dynamic gait  Goal status: IN PROGRESS3/07/2024  2.  Pt will improve FGA score to at least 22/30 to decrease fall risk. Baseline: 18/30>19/30 11/22/2023 Goal status: IN PROGRESS, 11/22/2023  3.  Pt will  improve MCTSIB Condition 4 to 30 seconds mod sway or better, for improved balance. Baseline: 6 sec, severe sway/30 seconds moderate sway Goal status: MET 11/21/2023  ASSESSMENT:  CLINICAL IMPRESSION: Demo ongoing hip abduction weakness R > L with 3-/5 exhibited and Trendelenberg gait ongoing.  Instructed in techniques to increase glute med recruitment for improved single limb stance to minimize gait/balance deviations.  Good tolerance to new activities and instructed in method to perform with her personal trainer. Continued sessions to progess POC details to improve mobility and strength  OBJECTIVE IMPAIRMENTS: Abnormal gait, decreased balance, decreased mobility, difficulty walking, and decreased strength.   ACTIVITY LIMITATIONS: standing, stairs, locomotion level, and caring for others  PARTICIPATION LIMITATIONS: shopping, community activity, yard work, and walking the dog, community fitness  PERSONAL FACTORS: 3+ comorbidities: see above; pt also reports anxiety  are also affecting patient's functional outcome.   REHAB POTENTIAL: Good  CLINICAL DECISION MAKING: Evolving/moderate complexity  EVALUATION COMPLEXITY: Moderate   PLAN:  PT FREQUENCY: 2x/week x 1 week (this week), then 1x/wk for 4 weeks  PT DURATION: 4 weeks  PLANNED INTERVENTIONS: 97110-Therapeutic exercises, 97530- Therapeutic activity, 97112- Neuromuscular re-education, 97535- Self Care, 81191- Manual therapy, [phone]- Gait training, Patient/Family education, Balance training, Stair training, and Vestibular training  PLAN FOR NEXT SESSION: Check HEP progress to dynamic gait activities to update HEP:  gait with head turns at counter, monster walk activity; Need to plan to recert next visit.  Continue weighted carry; Continue progression of HEP and continue to progress balance/gait exercises  1:59 PM, 11/23/23 M. Shary Decamp, PT, DPT Physical Therapist- [place] Office Number: [phone]

## 2023-11-23 NOTE — Progress Notes (Signed)
   11/23/23 1610  PAT Phone Screen  Is the patient taking a GLP-1 receptor agonist? No  Do You Have Diabetes? No  Do You Have Hypertension? No  Have You Ever Been to the ER for Asthma? No  Have You Taken Oral Steroids in the Past 3 Months? No  Do you Take Phenteramine or any Other Diet Drugs? No  Recent  Lab Work, EKG, CXR? Yes  Where was this test performed? 09-15-23 EKG-reviewed with Dr Tharon Aquas  Do you have a history of heart problems? (S)  Yes (chemo induced cardiomyopathy)  Cardiologist Name Dr Gasper Lloyd  Have you ever had tests on your heart? Yes  What cardiac tests were performed? Echo  What date/year were cardiac tests completed? 08-19-23 ECHO EF 55-60%  Results viewable: [facility] Media Tab  Any Recent Hospitalizations? No  Height 5\' 3"  (1.6 m)  Weight 78.2 kg  Pat Appointment Scheduled No  Reason for No Appointment Not Needed

## 2023-11-24 NOTE — Progress Notes (Signed)
 Surgical soap and pre-surgical drink (G2) given to patient, instructions given, patient verbalized understanding.

## 2023-11-27 NOTE — Anesthesia Preprocedure Evaluation (Addendum)
 Anesthesia Evaluation  Patient identified by MRN, date of birth, ID band Patient awake    Reviewed: Allergy & Precautions, NPO status , Patient's Chart, lab work & pertinent test results, reviewed documented beta blocker date and time   History of Anesthesia Complications Negative for: history of anesthetic complications  Airway Mallampati: III  TM Distance: >3 FB Neck ROM: Full    Dental no notable dental hx.    Pulmonary former smoker   Pulmonary exam normal        Cardiovascular hypertension, Pt. on medications and Pt. on home beta blockers Normal cardiovascular exam     Neuro/Psych   Anxiety Depression       GI/Hepatic Neg liver ROS,GERD  Medicated and Controlled,,  Endo/Other  Hypothyroidism    Renal/GU negative Renal ROS  negative genitourinary   Musculoskeletal  (+) Arthritis ,    Abdominal   Peds  Hematology  (+) Blood dyscrasia, anemia   Anesthesia Other Findings glaucoma  Reproductive/Obstetrics                             Anesthesia Physical Anesthesia Plan  ASA: 2  Anesthesia Plan: MAC   Post-op Pain Management: Tylenol PO (pre-op)*   Induction:   PONV Risk Score and Plan: 2 and Treatment may vary due to age or medical condition and Propofol infusion  Airway Management Planned: Natural Airway and Simple Face Mask  Additional Equipment: None  Intra-op Plan:   Post-operative Plan:   Informed Consent: I have reviewed the patients History and Physical, chart, labs and discussed the procedure including the risks, benefits and alternatives for the proposed anesthesia with the patient or authorized representative who has indicated his/her understanding and acceptance.       Plan Discussed with: CRNA  Anesthesia Plan Comments:        Anesthesia Quick Evaluation

## 2023-11-28 ENCOUNTER — Ambulatory Visit (HOSPITAL_BASED_OUTPATIENT_CLINIC_OR_DEPARTMENT_OTHER): Payer: Self-pay | Admitting: Certified Registered"

## 2023-11-28 ENCOUNTER — Other Ambulatory Visit: Payer: Self-pay

## 2023-11-28 ENCOUNTER — Ambulatory Visit (HOSPITAL_BASED_OUTPATIENT_CLINIC_OR_DEPARTMENT_OTHER)
Admission: RE | Admit: 2023-11-28 | Discharge: 2023-11-28 | Disposition: A | Discharge status: Home / Self Care | Payer: Medicare Other | Attending: General Surgery | Admitting: General Surgery

## 2023-11-28 ENCOUNTER — Ambulatory Visit

## 2023-11-28 ENCOUNTER — Encounter (HOSPITAL_BASED_OUTPATIENT_CLINIC_OR_DEPARTMENT_OTHER): Payer: Self-pay | Admitting: General Surgery

## 2023-11-28 ENCOUNTER — Encounter (HOSPITAL_BASED_OUTPATIENT_CLINIC_OR_DEPARTMENT_OTHER)
Admission: RE | Disposition: A | Discharge status: Home / Self Care | Payer: Self-pay | Source: Home / Self Care | Attending: General Surgery

## 2023-11-28 DIAGNOSIS — Z87891 Personal history of nicotine dependence: Secondary | ICD-10-CM | POA: Insufficient documentation

## 2023-11-28 DIAGNOSIS — K219 Gastro-esophageal reflux disease without esophagitis: Secondary | ICD-10-CM | POA: Diagnosis not present

## 2023-11-28 DIAGNOSIS — Z853 Personal history of malignant neoplasm of breast: Secondary | ICD-10-CM | POA: Insufficient documentation

## 2023-11-28 DIAGNOSIS — Z452 Encounter for adjustment and management of vascular access device: Secondary | ICD-10-CM | POA: Diagnosis present

## 2023-11-28 DIAGNOSIS — Z9221 Personal history of antineoplastic chemotherapy: Secondary | ICD-10-CM | POA: Insufficient documentation

## 2023-11-28 DIAGNOSIS — C50919 Malignant neoplasm of unspecified site of unspecified female breast: Secondary | ICD-10-CM | POA: Diagnosis not present

## 2023-11-28 DIAGNOSIS — Z923 Personal history of irradiation: Secondary | ICD-10-CM | POA: Diagnosis not present

## 2023-11-28 DIAGNOSIS — Z01818 Encounter for other preprocedural examination: Secondary | ICD-10-CM

## 2023-11-28 HISTORY — DX: Drug-induced polyneuropathy: T45.1X5A

## 2023-11-28 HISTORY — PX: PORT-A-CATH REMOVAL: SHX5289

## 2023-11-28 HISTORY — DX: Hypothyroidism, unspecified: E03.9

## 2023-11-28 HISTORY — DX: Drug-induced polyneuropathy: G62.0

## 2023-11-28 HISTORY — DX: Dyspnea, unspecified: R06.00

## 2023-11-28 SURGERY — REMOVAL PORT-A-CATH
Anesthesia: Monitor Anesthesia Care | Site: Chest | Laterality: Left

## 2023-11-28 MED ORDER — LIDOCAINE HCL (CARDIAC) PF 100 MG/5ML IV SOSY
PREFILLED_SYRINGE | INTRAVENOUS | Status: DC | PRN
  Administered 2023-11-28: 20 mg via INTRAVENOUS

## 2023-11-28 MED ORDER — OXYCODONE HCL 5 MG/5ML PO SOLN: 5.0000 mg | Freq: Once | ORAL | Status: DC | PRN

## 2023-11-28 MED ORDER — PROPOFOL 500 MG/50ML IV EMUL
INTRAVENOUS | Status: DC | PRN
  Administered 2023-11-28: 125 ug/kg/min via INTRAVENOUS

## 2023-11-28 MED ORDER — DEXMEDETOMIDINE HCL IN NACL 80 MCG/20ML IV SOLN
INTRAVENOUS | Status: DC | PRN
  Administered 2023-11-28: 4 ug via INTRAVENOUS

## 2023-11-28 MED ORDER — DROPERIDOL 2.5 MG/ML IJ SOLN: 0.6250 mg | Freq: Once | INTRAMUSCULAR | Status: DC | PRN

## 2023-11-28 MED ORDER — FENTANYL CITRATE (PF) 100 MCG/2ML IJ SOLN: 25.0000 ug | INTRAMUSCULAR | Status: DC | PRN

## 2023-11-28 MED ORDER — ACETAMINOPHEN 500 MG PO TABS
1000.0000 mg | ORAL_TABLET | Freq: Once | ORAL | Status: AC
  Administered 2023-11-28: 1000 mg via ORAL

## 2023-11-28 MED ORDER — PROPOFOL 500 MG/50ML IV EMUL
INTRAVENOUS | Status: AC
  Filled 2023-11-28: qty 100

## 2023-11-28 MED ORDER — CHLORHEXIDINE GLUCONATE CLOTH 2 % EX PADS: 6.0000 | MEDICATED_PAD | Freq: Once | CUTANEOUS | Status: DC

## 2023-11-28 MED ORDER — ACETAMINOPHEN 500 MG PO TABS
ORAL_TABLET | ORAL | Status: AC
  Filled 2023-11-28: qty 2

## 2023-11-28 MED ORDER — ONDANSETRON HCL 4 MG/2ML IJ SOLN
INTRAMUSCULAR | Status: DC | PRN
  Administered 2023-11-28: 4 mg via INTRAVENOUS

## 2023-11-28 MED ORDER — LIDOCAINE HCL (PF) 1 % IJ SOLN
INTRAMUSCULAR | Status: DC | PRN
  Administered 2023-11-28: 10 mL

## 2023-11-28 MED ORDER — LACTATED RINGERS IV SOLN: INTRAVENOUS | Status: DC

## 2023-11-28 MED ORDER — OXYCODONE HCL 5 MG PO TABS: 5.0000 mg | ORAL_TABLET | Freq: Once | ORAL | Status: DC | PRN

## 2023-11-28 MED ORDER — FENTANYL CITRATE (PF) 100 MCG/2ML IJ SOLN
INTRAMUSCULAR | Status: DC | PRN
  Administered 2023-11-28: 25 ug via INTRAVENOUS

## 2023-11-28 MED ORDER — FENTANYL CITRATE (PF) 100 MCG/2ML IJ SOLN
INTRAMUSCULAR | Status: AC
  Filled 2023-11-28: qty 2

## 2023-11-28 MED ORDER — ACETAMINOPHEN 500 MG PO TABS: 1000.0000 mg | ORAL_TABLET | ORAL | Status: DC

## 2023-11-28 SURGICAL SUPPLY — 25 items
BLADE SURG 15 STRL LF DISP TIS (BLADE) ×1 IMPLANT
CHLORAPREP W/TINT 26 (MISCELLANEOUS) ×1 IMPLANT
COVER BACK TABLE 60X90IN (DRAPES) ×1 IMPLANT
COVER MAYO STAND STRL (DRAPES) ×1 IMPLANT
DERMABOND ADVANCED .7 DNX12 (GAUZE/BANDAGES/DRESSINGS) ×1 IMPLANT
DRAPE LAPAROTOMY 100X72 PEDS (DRAPES) ×1 IMPLANT
DRAPE UTILITY XL STRL (DRAPES) ×1 IMPLANT
ELECT COATED BLADE 2.86 ST (ELECTRODE) ×1 IMPLANT
ELECT REM PT RETURN 9FT ADLT (ELECTROSURGICAL) ×1 IMPLANT
ELECTRODE REM PT RTRN 9FT ADLT (ELECTROSURGICAL) ×1 IMPLANT
GLOVE BIO SURGEON STRL SZ7 (GLOVE) ×1 IMPLANT
GLOVE BIOGEL PI IND STRL 7.5 (GLOVE) ×1 IMPLANT
GOWN STRL REUS W/ TWL LRG LVL3 (GOWN DISPOSABLE) ×2 IMPLANT
NDL HYPO 25X1 1.5 SAFETY (NEEDLE) ×1 IMPLANT
NEEDLE HYPO 25X1 1.5 SAFETY (NEEDLE) ×1 IMPLANT
PACK BASIN DAY SURGERY FS (CUSTOM PROCEDURE TRAY) ×1 IMPLANT
PENCIL SMOKE EVACUATOR (MISCELLANEOUS) ×1 IMPLANT
SLEEVE SCD COMPRESS KNEE MED (STOCKING) IMPLANT
SPIKE FLUID TRANSFER (MISCELLANEOUS) ×1 IMPLANT
SPONGE T-LAP 4X18 ~~LOC~~+RFID (SPONGE) ×1 IMPLANT
STRIP CLOSURE SKIN 1/2X4 (GAUZE/BANDAGES/DRESSINGS) ×1 IMPLANT
SUT MNCRL AB 4-0 PS2 18 (SUTURE) ×1 IMPLANT
SUT VIC AB 3-0 SH 27X BRD (SUTURE) ×1 IMPLANT
SYR CONTROL 10ML LL (SYRINGE) ×1 IMPLANT
TOWEL GREEN STERILE FF (TOWEL DISPOSABLE) ×1 IMPLANT

## 2023-11-28 NOTE — Interval H&P Note (Signed)
 History and Physical Interval Note:  11/28/2023 7:28 AM  Judy Morrison  has presented today for surgery, with the diagnosis of BREAST CANCER.  The various methods of treatment have been discussed with the patient and family. After consideration of risks, benefits and other options for treatment, the patient has consented to  Procedure(s): REMOVAL PORT-A-CATH (N/A) as a surgical intervention.  The patient's history has been reviewed, patient examined, no change in status, stable for surgery.  I have reviewed the patient's chart and labs.  Questions were answered to the patient's satisfaction.     [name]

## 2023-11-28 NOTE — Discharge Instructions (Addendum)
    PORT-A-CATH: POST OP INSTRUCTIONS  Take your usually prescribed medications unless otherwise directed. If you need a refill on your pain medication, please contact our office. All narcotic pain medicine now requires a paper prescription.  Phoned in and fax refills are no longer allowed by law.  Prescriptions will not be filled after 5 pm or on weekends.  You should follow a light diet for the remainder of the day after your procedure. Most patients will experience some mild swelling and/or bruising in the area of the incision. It may take several days to resolve. It is common to experience some constipation if taking pain medication after surgery. Increasing fluid intake and taking a stool softener (such as Colace) will usually help or prevent this problem from occurring. A mild laxative (Milk of Magnesia or Miralax) should be taken according to package directions if there are no bowel movements after 48 hours.  I used Dermabond (skin glue) on the incision, you may shower in 24 hours.  The glue and tape strip will flake off over the next 2-3 weeks.  ACTIVITIES: may return to normal activities tomorrow  WHEN TO CALL YOUR DOCTOR [phone]): Fever over 101.0 Chills Continued bleeding from incision Increased redness and tenderness at the site Shortness of breath, difficulty breathing   The clinic staff is available to answer your questions during regular business hours. Please don't hesitate to call and ask to speak to one of the nurses or medical assistants for clinical concerns. If you have a medical emergency, go to the nearest emergency room or call 911.  A surgeon from [facility] is always on call at the hospital.     For further information, please visit [email or web address]    Post Anesthesia Home Care Instructions  Activity: Get plenty of rest for the remainder of the day. A responsible individual must stay with you for 24 hours following the  procedure.  For the next 24 hours, DO NOT: -Drive a car -Advertising copywriter -Drink alcoholic beverages -Take any medication unless instructed by your physician -Make any legal decisions or sign important papers.  Meals: Start with liquid foods such as gelatin or soup. Progress to regular foods as tolerated. Avoid greasy, spicy, heavy foods. If nausea and/or vomiting occur, drink only clear liquids until the nausea and/or vomiting subsides. Call your physician if vomiting continues.  Special Instructions/Symptoms: Your throat may feel dry or sore from the anesthesia or the breathing tube placed in your throat during surgery. If this causes discomfort, gargle with warm salt water. The discomfort should disappear within 24 hours.  If you had a scopolamine patch placed behind your ear for the management of post- operative nausea and/or vomiting:  1. The medication in the patch is effective for 72 hours, after which it should be removed.  Wrap patch in a tissue and discard in the trash. Wash hands thoroughly with soap and water. 2. You may remove the patch earlier than 72 hours if you experience unpleasant side effects which may include dry mouth, dizziness or visual disturbances. 3. Avoid touching the patch. Wash your hands with soap and water after contact with the patch.    No tylenol until after 1:30pm if needed today

## 2023-11-28 NOTE — Transfer of Care (Signed)
 Immediate Anesthesia Transfer of Care Note  Patient: Judy Morrison  Procedure(s) Performed: REMOVAL PORT-A-CATH (Left: Chest)  Patient Location: PACU  Anesthesia Type:MAC  Level of Consciousness: awake and patient cooperative  Airway & Oxygen Therapy: Patient Spontanous Breathing  Post-op Assessment: Report given to RN and Post -op Vital signs reviewed and stable  Post vital signs: Reviewed and stable  Last Vitals:  Vitals Value Taken Time  BP 113/58 11/28/23 0819  Temp    Pulse 67 11/28/23 0819  Resp 14 11/28/23 0819  SpO2 97 % 11/28/23 0819  Vitals shown include unfiled device data.  Last Pain:  Vitals:   11/28/23 0728  TempSrc: Oral  PainSc: 0-No pain      Patients Stated Pain Goal: 3 (11/28/23 0728)  Complications: No notable events documented.

## 2023-11-28 NOTE — Anesthesia Postprocedure Evaluation (Signed)
 Anesthesia Post Note  Patient: Judy Morrison  Procedure(s) Performed: REMOVAL PORT-A-CATH (Left: Chest)     Patient location during evaluation: PACU Anesthesia Type: MAC Level of consciousness: awake and alert Pain management: pain level controlled Vital Signs Assessment: post-procedure vital signs reviewed and stable Respiratory status: spontaneous breathing, nonlabored ventilation and respiratory function stable Cardiovascular status: blood pressure returned to baseline Postop Assessment: no apparent nausea or vomiting Anesthetic complications: no   No notable events documented.  Last Vitals:  Vitals:   11/28/23 0728 11/28/23 0819  BP: 125/67 (!) 113/58  Pulse: 77 67  Resp: 14 13  Temp: 36.6 C (!) 36.3 C  SpO2: 97% 96%    Last Pain:  Vitals:   11/28/23 0819  TempSrc:   PainSc: Asleep                 Shanda Howells

## 2023-11-28 NOTE — Progress Notes (Signed)
 ADVANCED HEART FAILURE CLINIC NOTE  Referring Physician: Medicine, [facility]*  Primary Care: Medicine, [facility] Family Oncologist: Dr. Al Pimple  HPI: Judy Morrison is a 73 y.o. female with breast cancer presenting today to establish care.  Her cancer history dates back to November 2023 when screening mammogram demonstrated right breast irregularity.  The following months she had a pathology confirmed grade 2 invasive mammary carcinoma.  She underwent right breast lumpectomy by Dr. 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  She was started on chemotherapy in November 08, 2022 with paclitaxel, trastuzumab.  Interval hx:  - She completed herceptin in 11/01/23; reports that she has been feeling fairly well aside from some fatigue & slugishness.  - Started on iron roughly 3 weeks ago.   Current Outpatient Medications  Medication Sig Dispense Refill   Adapalene 0.3 % gel Apply 1 application daily as needed topically (for acne).     ALPRAZolam (XANAX) 0.25 MG tablet Take 0.25 mg by mouth daily as needed for anxiety.   1   anastrozole (ARIMIDEX) 1 MG tablet Take 1 tablet (1 mg total) by mouth daily. 90 tablet 3   brexpiprazole (REXULTI) 2 MG TABS tablet Take 2 mg by mouth daily after breakfast.     carvedilol (COREG) 3.125 MG tablet Take 1 tablet (3.125 mg total) by mouth 2 (two) times daily. 60 tablet 3   clobetasol cream (TEMOVATE) 0.05 % Apply 1 Application topically 2 (two) times daily as needed (irritation).     clonazePAM (KLONOPIN) 0.5 MG tablet Take 0.5 mg by mouth 2 (two) times daily.     clotrimazole (MYCELEX) 10 MG troche Take 10 mg by mouth as directed. 2 times per week     DULoxetine (CYMBALTA) 20 MG capsule Take 20 mg by mouth daily.     ferrous sulfate 325 (65 FE) MG EC tablet Take 325 mg by mouth 3 (three) times daily with meals.     hydrOXYzine (VISTARIL) 25 MG capsule  Take 25-50 mg by mouth daily.     latanoprost (XALATAN) 0.005 % ophthalmic solution Place 1 drop into both eyes at bedtime.     levothyroxine (SYNTHROID, LEVOTHROID) 50 MCG tablet Take 50 mcg at bedtime by mouth.      lidocaine-prilocaine (EMLA) cream Apply 1 Application to affected area topically once daily. 30 g 3   loratadine (CLARITIN) 10 MG tablet Take 10 mg by mouth daily.     losartan (COZAAR) 50 MG tablet Take 1 tablet (50 mg total) by mouth daily. 30 tablet 5   nortriptyline (PAMELOR) 50 MG capsule Take 100 mg at bedtime by mouth.      omeprazole (PRILOSEC) 40 MG capsule Take 40 mg by mouth daily before breakfast.     OVER THE COUNTER MEDICATION 20 mLs by Mouth Rinse route 4 (four) times daily as needed (mouth pain). Oral-B Sore Mouth Rinse     phenazopyridine (PYRIDIUM) 200 MG tablet Take 200 mg by mouth 3 (three) times daily as needed.     simvastatin (ZOCOR) 10 MG tablet Take 10 mg by mouth daily.     tacrolimus (PROGRAF) 1 MG capsule Take 1 mg by mouth 2 (two) times daily. Per patient - Dissolve capsule in one half liter of H2O. Swish and spit twice daily     triamcinolone cream (KENALOG) 0.1 % Apply 1 Application topically 2 (two) times daily as needed (irritation).  valACYclovir (VALTREX) 1000 MG tablet Take 1,000 mg by mouth daily as needed (outbreaks).     furosemide (LASIX) 20 MG tablet Take 1 tablet (20 mg total) by mouth as needed. 30 tablet 3   No current facility-administered medications for this encounter.   Facility-Administered Medications Ordered in Other Encounters  Medication Dose Route Frequency Provider Last Rate Last Admin   sodium chloride flush (NS) 0.9 % injection 10 mL  10 mL Intracatheter PRN Iruku, Praveena, MD         PHYSICAL EXAM: Vitals:   11/29/23 1158  BP: (!) 140/82  Pulse: 73  SpO2: 97%   GENERAL: NAD Lungs- CTA CARDIAC:  JVP: 7 cm          Normal rate with regular rhythm. no murmur.  Pulses 2+. no edema.  ABDOMEN: Soft, non-tender,  non-distended.  EXTREMITIES: Warm and well perfused.  NEUROLOGIC: No obvious FND    DATA REVIEW  ECG: 10/02/21: NSR, LVH  As per my personal interpretation  ECHO: 08/19/23: LVEF 55%-60%, grade I DD 02/14/23: LVEF 50-55, GLS -13%, normal RV function as per my personal interpretation 10/18/22: LVEF 55%, GLS -15% normal RV function, mild AI 04/05/23: LVEF 65%, normal RV function, mild AI, GLS -19.9%.    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  GLS has suboptimal tracing and is mostly unchanged.   -TTE  on 08/19/2023 with LVEF of 60% and grade 1 diastolic dysfunction. -repeat TTE today (11/29/23) with reduction in LVEF to 45%. This can be secondary to herceptin and/or underlying LBBB. If it is secondary to herceptin, I suspect LVEF will recover over the next few months with GDMT. Will repeat TTE in 66m and reach out to Dr. Al Pimple. She is doing fairly well from a symptomatic standpoint. Will reduce lasix to 20mg  PRN.  -continue coreg 3.125mg  BID -start losartan 50mg  daily; discontinue dyazide.  -BMP/BNP today.    2.  Invasive ductal carcinoma -Following with Dr. Al Pimple -Completed Herceptin on 11/01/23 -Repeat TTE today with LVEF of 40-45%.   3. Iron deficiency  - Feels fatigued; ferritin of 8, iron 53, TIBC 515 on 11/01/23 - Will discuss iron infusion with Dr. Al Pimple  I spent 37 minutes caring for this patient today including face to face time, ordering and reviewing labs, reviewing echo from today with her, discussing herceptin cardiotoxicity, seeing the patient, documenting in the record, and arranging follow ups.    [facility] Advanced Heart Failure Mechanical Circulatory Support

## 2023-11-28 NOTE — H&P (Signed)
 Judy Morrison is an 73 y.o. female.   Chief Complaint: breast cancer no longer needs venous access HPI: 60 yof s/p lump/snf followed by chemo/trastuzumab and radiotherapy. She is doing well and has completed all therapies now. Desires port removal  Past Medical History:  Diagnosis Date   Anemia    Anxiety    Arthritis    Breast cancer (HCC) 2024   right breast IDC   Depressed    Dermatitis    rare form, on prednisone   Dyspnea    with exertion mostly   GERD (gastroesophageal reflux disease)    controlled with Omeprazole   Glaucoma    Hypothyroidism    Peripheral neuropathy due to chemotherapy [facility])    Personal history of chemotherapy    Personal history of radiation therapy    Torn rotator cuff    RIGHT    Urinary tract infection    dx 05-26-17 took 1 week cipro BID completed 06-02-17.    Past Surgical History:  Procedure Laterality Date   ADENOIDECTOMY     APPENDECTOMY     arthroscopic right knee      BREAST BIOPSY Right 09/01/2022   MM RT BREAST BX W LOC DEV 1ST LESION IMAGE BX SPEC STEREO GUIDE 09/01/2022 [facility]   BREAST BIOPSY  10/01/2022   MM RT RADIOACTIVE SEED LOC MAMMO GUIDE 10/01/2022 [facility]   BREAST LUMPECTOMY     BREAST LUMPECTOMY WITH RADIOACTIVE SEED AND SENTINEL LYMPH NODE BIOPSY Right 10/05/2022   Procedure: RIGHT BREAST LUMPECTOMY WITH RADIOACTIVE SEED AND AXILLARY SENTINEL LYMPH NODE BIOPSY;  Surgeon: [name], MD;  Location: [facility];  Service: General;  Laterality: Right;   CHOLECYSTECTOMY     COLONOSCOPY  06/06/2017   Pyrtle   Pelvic sling     x2   POLYPECTOMY     PORT A CATH REVISION N/A 11/22/2022   Procedure: PORT A CATH REVISION;  Surgeon: [name], MD;  Location: [facility];  Service: General;  Laterality: N/A;   PORTACATH PLACEMENT Left 10/05/2022   Procedure: INSERTION PORT-A-CATH;  Surgeon: [name], MD;  Location: [facility];  Service: General;   Laterality: Left;   ROTATOR CUFF REPAIR Right    TONSILLECTOMY     TOTAL KNEE ARTHROPLASTY Right 08/12/2017   Procedure: RIGHT TOTAL KNEE ARTHROPLASTY;  Surgeon: Eugenia Mcalpine, MD;  Location: [facility];  Service: Orthopedics;  Laterality: Right;   TOTAL KNEE ARTHROPLASTY Left 01/06/2018   Procedure: LEFT TOTAL KNEE ARTHROPLASTY;  Surgeon: Eugenia Mcalpine, MD;  Location: [facility];  Service: Orthopedics;  Laterality: Left;   UPPER GASTROINTESTINAL ENDOSCOPY     WISDOM TOOTH EXTRACTION      Family History  Problem Relation Age of Onset   Bowel Disease Mother        ishemic bowel   Lung cancer Mother        smoked   Cervical cancer Paternal Aunt    Cancer Maternal Grandfather        unknown type   Lung cancer Paternal Grandfather        smoked   Colon cancer Neg Hx    Breast cancer Neg Hx    Esophageal cancer Neg Hx    Pancreatic cancer Neg Hx    Prostate cancer Neg Hx    Rectal cancer Neg Hx    Stomach cancer Neg Hx    Allergic rhinitis Neg Hx    Asthma Neg Hx    Eczema Neg Hx  Urticaria Neg Hx    Colon polyps Neg Hx    Social History:  reports that she quit smoking about 39 years ago. Her smoking use included cigarettes. She has never used smokeless tobacco. She reports current alcohol use of about 2.0 standard drinks of alcohol per week. She reports that she does not use drugs.  Allergies:  Allergies  Allergen Reactions   Penicillin G Anaphylaxis   Cephalexin Other (See Comments)    Pt does not remember reaction    Codeine Nausea Only   Erythromycin Itching    Medications Prior to Admission  Medication Sig Dispense Refill   Adapalene 0.3 % gel Apply 1 application daily as needed topically (for acne).     ALPRAZolam (XANAX) 0.25 MG tablet Take 0.25 mg by mouth daily as needed for anxiety.   1   anastrozole (ARIMIDEX) 1 MG tablet Take 1 tablet (1 mg total) by mouth daily. 90 tablet 3   brexpiprazole (REXULTI) 2 MG TABS tablet Take 2 mg by mouth daily after breakfast.      carvedilol (COREG) 3.125 MG tablet Take 1 tablet (3.125 mg total) by mouth 2 (two) times daily. 60 tablet 3   clobetasol cream (TEMOVATE) 0.05 % Apply 1 Application topically 2 (two) times daily as needed (irritation).     clonazePAM (KLONOPIN) 0.5 MG tablet Take 0.5 mg by mouth 2 (two) times daily.     clotrimazole (MYCELEX) 10 MG troche Take 10 mg by mouth as directed. 2 times per week     DULoxetine (CYMBALTA) 20 MG capsule Take 20 mg by mouth daily.     ferrous sulfate 325 (65 FE) MG EC tablet Take 325 mg by mouth 3 (three) times daily with meals.     furosemide (LASIX) 20 MG tablet Take 1 tablet (20 mg total) by mouth as needed. 90 tablet 3   hydrOXYzine (VISTARIL) 25 MG capsule Take 25-50 mg by mouth daily.     latanoprost (XALATAN) 0.005 % ophthalmic solution Place 1 drop into both eyes at bedtime.     levothyroxine (SYNTHROID, LEVOTHROID) 50 MCG tablet Take 50 mcg at bedtime by mouth.      loratadine (CLARITIN) 10 MG tablet Take 10 mg by mouth daily.     nortriptyline (PAMELOR) 50 MG capsule Take 100 mg at bedtime by mouth.      omeprazole (PRILOSEC) 40 MG capsule Take 40 mg by mouth daily before breakfast.     phenazopyridine (PYRIDIUM) 200 MG tablet Take 200 mg by mouth 3 (three) times daily as needed.     simvastatin (ZOCOR) 10 MG tablet Take 10 mg by mouth daily.     tacrolimus (PROGRAF) 1 MG capsule Take 1 mg by mouth 2 (two) times daily. Per patient - Dissolve capsule in one half liter of H2O. Swish and spit twice daily     triamcinolone cream (KENALOG) 0.1 % Apply 1 Application topically 2 (two) times daily as needed (irritation).     triamterene-hydrochlorothiazide (DYAZIDE) 37.5-25 MG capsule TAKE 1 CAPSULE BY MOUTH DAILY. 90 capsule 1   lidocaine-prilocaine (EMLA) cream Apply 1 Application to affected area topically once daily. 30 g 3   OVER THE COUNTER MEDICATION 20 mLs by Mouth Rinse route 4 (four) times daily as needed (mouth pain). Oral-B Sore Mouth Rinse     valACYclovir  (VALTREX) 1000 MG tablet Take 1,000 mg by mouth daily as needed (outbreaks).      No results found for this or any previous visit (from the  past 48 hours). No results found.  Review of Systems  All other systems reviewed and are negative.   Height 5\' 3"  (1.6 m), weight 78.2 kg. Physical Exam  General nad Cv regular  Pulm effort normal Left sided port in place  Assessment/Plan Breast cancer no longer needs venous access Port removal  [name], MD 11/28/2023, 7:26 AM

## 2023-11-28 NOTE — Op Note (Signed)
 Preoperative diagnosis: Breast cancer, no longer needs venous access Postoperative diagnosis: Same as above Procedure: Port removal Surgeon: Dr. Harden Mo Anesthesia: Local with monitored anesthesia care Specimens: None Estimated blood loss: Minimal Complications: None Sponge needle count was correct completion Disposition recovery stable condition  Indications: This is a 73 year old female who is undergoing her therapy for breast cancer.  She no longer needs venous access and desires port removal.  We discussed doing this under local with some sedation.  Procedure: After informed consent was obtained she was taken to the operating room.  She was placed under MAC.  She was prepped and draped in a standard sterile surgical fashion.  Surgical timeout was then performed.  I infiltrated mixture of 1% lidocaine and quarter percent Marcaine and her old scar on her chest.  I then reentered this.  I remove the port, line, suture material in their entirety.  I then obtained hemostasis.  I closed this with 3-0 Vicryl and 4-0 Monocryl.  Glue and a single Steri-Strip were applied.  She tolerated this well was transferred to recovery in stable condition.

## 2023-11-29 ENCOUNTER — Encounter (HOSPITAL_BASED_OUTPATIENT_CLINIC_OR_DEPARTMENT_OTHER): Payer: Self-pay | Admitting: General Surgery

## 2023-11-29 ENCOUNTER — Ambulatory Visit (HOSPITAL_COMMUNITY)
Admission: RE | Admit: 2023-11-29 | Discharge: 2023-11-29 | Disposition: A | Discharge status: Home / Self Care | Payer: Medicare Other | Source: Ambulatory Visit | Attending: Cardiology | Admitting: Cardiology

## 2023-11-29 VITALS — BP 140/82 | HR 73 | Ht 63.0 in | Wt 174.0 lb

## 2023-11-29 DIAGNOSIS — Z08 Encounter for follow-up examination after completed treatment for malignant neoplasm: Secondary | ICD-10-CM | POA: Insufficient documentation

## 2023-11-29 DIAGNOSIS — I5021 Acute systolic (congestive) heart failure: Secondary | ICD-10-CM | POA: Diagnosis not present

## 2023-11-29 DIAGNOSIS — Z79811 Long term (current) use of aromatase inhibitors: Secondary | ICD-10-CM | POA: Diagnosis not present

## 2023-11-29 DIAGNOSIS — I080 Rheumatic disorders of both mitral and aortic valves: Secondary | ICD-10-CM | POA: Insufficient documentation

## 2023-11-29 DIAGNOSIS — Z79621 Long term (current) use of calcineurin inhibitor: Secondary | ICD-10-CM | POA: Diagnosis not present

## 2023-11-29 DIAGNOSIS — I447 Left bundle-branch block, unspecified: Secondary | ICD-10-CM | POA: Diagnosis not present

## 2023-11-29 DIAGNOSIS — C50411 Malignant neoplasm of upper-outer quadrant of right female breast: Secondary | ICD-10-CM | POA: Diagnosis not present

## 2023-11-29 DIAGNOSIS — Z5181 Encounter for therapeutic drug level monitoring: Secondary | ICD-10-CM | POA: Diagnosis not present

## 2023-11-29 DIAGNOSIS — Z79899 Other long term (current) drug therapy: Secondary | ICD-10-CM

## 2023-11-29 DIAGNOSIS — Z87891 Personal history of nicotine dependence: Secondary | ICD-10-CM | POA: Insufficient documentation

## 2023-11-29 DIAGNOSIS — I5030 Unspecified diastolic (congestive) heart failure: Secondary | ICD-10-CM | POA: Diagnosis not present

## 2023-11-29 DIAGNOSIS — Z170 Estrogen receptor positive status [ER+]: Secondary | ICD-10-CM

## 2023-11-29 DIAGNOSIS — C50911 Malignant neoplasm of unspecified site of right female breast: Secondary | ICD-10-CM | POA: Insufficient documentation

## 2023-11-29 DIAGNOSIS — E611 Iron deficiency: Secondary | ICD-10-CM | POA: Insufficient documentation

## 2023-11-29 DIAGNOSIS — Z9221 Personal history of antineoplastic chemotherapy: Secondary | ICD-10-CM | POA: Diagnosis not present

## 2023-11-29 LAB — BASIC METABOLIC PANEL
Anion gap: 4 — ABNORMAL LOW (ref 5–15)
BUN: 13 mg/dL (ref 8–23)
CO2: 29 mmol/L (ref 22–32)
Calcium: 8.9 mg/dL (ref 8.9–10.3)
Chloride: 99 mmol/L (ref 98–111)
Creatinine, Ser: 0.87 mg/dL (ref 0.44–1.00)
GFR, Estimated: >60 mL/min (ref >60)
Glucose, Bld: 98 mg/dL (ref 70–99)
Potassium: 4.3 mmol/L (ref 3.5–5.1)
Sodium: 132 mmol/L — ABNORMAL LOW (ref 135–145)

## 2023-11-29 LAB — ECHOCARDIOGRAM COMPLETE
AR max vel: 1.67 cm2
AV Area VTI: 1.43 cm2
AV Area mean vel: 1.61 cm2
AV Mean grad: 7 mmHg
AV Peak grad: 12.1 mmHg
Ao pk vel: 1.74 m/s
Area-P 1/2: 6.65 cm2
Calc EF: 49.8 %
S' Lateral: 3.7 cm
Single Plane A2C EF: 54.1 %
Single Plane A4C EF: 43.2 %

## 2023-11-29 LAB — BRAIN NATRIURETIC PEPTIDE: B Natriuretic Peptide: 102.7 pg/mL — ABNORMAL HIGH (ref 0.0–100.0)

## 2023-11-29 MED ORDER — FUROSEMIDE 20 MG PO TABS: 20.0000 mg | ORAL_TABLET | ORAL | 3 refills | Status: DC | PRN

## 2023-11-29 MED ORDER — LOSARTAN POTASSIUM 50 MG PO TABS: 50.0000 mg | ORAL_TABLET | Freq: Every day | ORAL | 5 refills | Status: DC

## 2023-11-29 NOTE — Patient Instructions (Addendum)
 Medication Changes:  STOP DYAZIDE (triamterene-hydrochlorothiazide)  START: LOSARTAN 50MG  DAILY   TAKE LASIX (FUROSEMIDE) ONCE DAILY AS NEEDED FOR SHORTNESS OF BREATH OR WEIGHT GAIN OR SWELLING  Lab Work:  Labs done today, your results will be available in MyChart, we will contact you for abnormal readings.  Follow-Up in: 2 MONTHS AS SCHEDULED   At the [facility], you and your health needs are our priority. We have a designated team specialized in the treatment of Heart Failure. This Care Team includes your primary Heart Failure Specialized Cardiologist (physician), Advanced Practice Providers (APPs- Physician Assistants and Nurse Practitioners), and Pharmacist who all work together to provide you with the care you need, when you need it.   You may see any of the following providers on your designated Care Team at your next follow up:  Dr. Arvilla Meres Dr. Marca Ancona Dr. Dorthula Nettles Dr. Theresia Bough Tonye Becket, NP Robbie Lis, [place] [facility] [place], [place] Brynda Peon, NP [place] Lee, NP Karle Plumber, PharmD   Please be sure to bring in all your medications bottles to every appointment.   Need to Contact us:  If you have any questions or concerns before your next appointment please send [place] a message through [place] or call our office at [phone].    TO LEAVE A MESSAGE FOR THE NURSE SELECT OPTION 2, PLEASE LEAVE A MESSAGE INCLUDING: YOUR NAME DATE OF BIRTH CALL BACK NUMBER REASON FOR CALL**this is important as we prioritize the call backs  YOU WILL RECEIVE A CALL BACK THE SAME DAY AS LONG AS YOU CALL BEFORE 4:00 PM

## 2023-12-02 ENCOUNTER — Other Ambulatory Visit (HOSPITAL_COMMUNITY): Payer: Medicare Other

## 2023-12-02 ENCOUNTER — Encounter (HOSPITAL_COMMUNITY): Payer: Medicare Other | Admitting: Cardiology

## 2023-12-06 DIAGNOSIS — I509 Heart failure, unspecified: Secondary | ICD-10-CM | POA: Insufficient documentation

## 2023-12-06 DIAGNOSIS — J9620 Acute and chronic respiratory failure, unspecified whether with hypoxia or hypercapnia: Secondary | ICD-10-CM | POA: Insufficient documentation

## 2023-12-06 DIAGNOSIS — I447 Left bundle-branch block, unspecified: Secondary | ICD-10-CM | POA: Insufficient documentation

## 2023-12-06 DIAGNOSIS — I5022 Chronic systolic (congestive) heart failure: Secondary | ICD-10-CM | POA: Insufficient documentation

## 2023-12-06 DIAGNOSIS — D509 Iron deficiency anemia, unspecified: Secondary | ICD-10-CM | POA: Insufficient documentation

## 2023-12-06 DIAGNOSIS — I10 Essential (primary) hypertension: Secondary | ICD-10-CM | POA: Insufficient documentation

## 2023-12-06 DIAGNOSIS — J189 Pneumonia, unspecified organism: Secondary | ICD-10-CM | POA: Insufficient documentation

## 2023-12-08 ENCOUNTER — Ambulatory Visit: Admitting: Physical Therapy

## 2023-12-09 ENCOUNTER — Encounter: Payer: Self-pay | Admitting: Hematology and Oncology

## 2023-12-10 ENCOUNTER — Other Ambulatory Visit (HOSPITAL_COMMUNITY): Payer: Self-pay | Admitting: Cardiology

## 2023-12-13 ENCOUNTER — Ambulatory Visit: Admitting: Physical Therapy

## 2023-12-13 ENCOUNTER — Encounter: Payer: Self-pay | Admitting: *Deleted

## 2023-12-15 ENCOUNTER — Encounter: Payer: Self-pay | Admitting: Hematology and Oncology

## 2023-12-16 ENCOUNTER — Encounter: Payer: Self-pay | Admitting: Hematology and Oncology

## 2023-12-20 ENCOUNTER — Encounter: Admitting: Physical Therapy

## 2023-12-20 ENCOUNTER — Telehealth: Payer: Self-pay

## 2023-12-20 ENCOUNTER — Other Ambulatory Visit: Payer: Self-pay

## 2023-12-20 DIAGNOSIS — R911 Solitary pulmonary nodule: Secondary | ICD-10-CM

## 2023-12-20 NOTE — Telephone Encounter (Signed)
 Pt's CT is scheduled for 4/14

## 2023-12-20 NOTE — Telephone Encounter (Signed)
 Would recommend follow up after and it can be in person or over the phone

## 2023-12-20 NOTE — Telephone Encounter (Signed)
 Attempted to call pt's daughter, Marliss Czar to find out what days/times work best for them to schedule CT chest. LVM for call back.

## 2023-12-20 NOTE — Telephone Encounter (Signed)
 Pt scheduled for 01/03/24 at 1340. Pt's daughter, Marliss Czar in agreement.

## 2023-12-26 ENCOUNTER — Encounter (HOSPITAL_COMMUNITY): Payer: Self-pay

## 2023-12-26 ENCOUNTER — Ambulatory Visit (HOSPITAL_COMMUNITY)
Admission: RE | Admit: 2023-12-26 | Discharge: 2023-12-26 | Disposition: A | Discharge status: Home / Self Care | Source: Ambulatory Visit | Attending: Adult Health | Admitting: Adult Health

## 2023-12-26 DIAGNOSIS — R911 Solitary pulmonary nodule: Secondary | ICD-10-CM | POA: Diagnosis present

## 2023-12-26 MED ORDER — IOHEXOL 300 MG/ML  SOLN
75.0000 mL | Freq: Once | INTRAMUSCULAR | Status: AC | PRN
  Administered 2023-12-26: 75 mL via INTRAVENOUS

## 2023-12-26 MED ORDER — SODIUM CHLORIDE (PF) 0.9 % IJ SOLN
INTRAMUSCULAR | Status: AC
  Filled 2023-12-26: qty 50

## 2024-01-03 ENCOUNTER — Inpatient Hospital Stay: Attending: Hematology and Oncology | Admitting: Adult Health

## 2024-01-03 VITALS — BP 147/71 | HR 101 | Temp 98.5°F | Resp 18 | Ht 63.0 in | Wt 171.1 lb

## 2024-01-03 DIAGNOSIS — C50411 Malignant neoplasm of upper-outer quadrant of right female breast: Secondary | ICD-10-CM

## 2024-01-03 DIAGNOSIS — Z1731 Human epidermal growth factor receptor 2 positive status: Secondary | ICD-10-CM | POA: Diagnosis not present

## 2024-01-03 DIAGNOSIS — Z1721 Progesterone receptor positive status: Secondary | ICD-10-CM | POA: Diagnosis not present

## 2024-01-03 DIAGNOSIS — Z170 Estrogen receptor positive status [ER+]: Secondary | ICD-10-CM | POA: Diagnosis not present

## 2024-01-03 DIAGNOSIS — Z801 Family history of malignant neoplasm of trachea, bronchus and lung: Secondary | ICD-10-CM | POA: Diagnosis not present

## 2024-01-03 DIAGNOSIS — Z87891 Personal history of nicotine dependence: Secondary | ICD-10-CM | POA: Diagnosis not present

## 2024-01-03 DIAGNOSIS — Z923 Personal history of irradiation: Secondary | ICD-10-CM | POA: Insufficient documentation

## 2024-01-03 DIAGNOSIS — R918 Other nonspecific abnormal finding of lung field: Secondary | ICD-10-CM

## 2024-01-03 DIAGNOSIS — Z9221 Personal history of antineoplastic chemotherapy: Secondary | ICD-10-CM | POA: Diagnosis not present

## 2024-01-03 DIAGNOSIS — Z79811 Long term (current) use of aromatase inhibitors: Secondary | ICD-10-CM | POA: Insufficient documentation

## 2024-01-03 DIAGNOSIS — Z8049 Family history of malignant neoplasm of other genital organs: Secondary | ICD-10-CM | POA: Diagnosis not present

## 2024-01-06 ENCOUNTER — Encounter (HOSPITAL_COMMUNITY): Payer: Self-pay | Admitting: Cardiology

## 2024-01-06 MED ORDER — LOSARTAN POTASSIUM 25 MG PO TABS: 25.0000 mg | ORAL_TABLET | Freq: Every day | ORAL | 3 refills | Status: DC

## 2024-01-08 ENCOUNTER — Encounter: Payer: Self-pay | Admitting: Adult Health

## 2024-01-08 ENCOUNTER — Encounter: Payer: Self-pay | Admitting: Hematology and Oncology

## 2024-01-08 NOTE — Progress Notes (Signed)
 [facility] Cancer Follow up:    Medicine, [facility] [address] Anise Barlow [place] [address]   DIAGNOSIS:  Cancer Staging  Malignant neoplasm of upper-outer quadrant of right breast in female, estrogen receptor positive (HCC) Staging form: Breast, AJCC 8th Edition - Pathologic stage from 10/05/2022: Stage IA (pT1c, pN0, cM0, G2, ER+, PR+, HER2+) - Signed by Percival Brace, NP on 11/08/2022 Stage prefix: Initial diagnosis Histologic grading system: 3 grade system   SUMMARY OF ONCOLOGIC HISTORY: Oncology History  Malignant neoplasm of upper-outer quadrant of right breast in female, estrogen receptor positive (HCC)  08/02/2022 Mammogram   In the right breast, possible distortion warrants further evaluation. In the left breast, no findings suspicious for malignancy. Diagnostic mammogram showed persistent subtle distortion central slightly LATERAL aspect of the RIGHT breast warranting tissue diagnosis.     09/01/2022 Pathology Results   Final pathology showed grade 2 invasive mammary carcinoma, prognostic showed ER 90% positive strong staining PR 90% positive strong staining, Ki-67 of 5% and HER2 3+ by [facility]   09/10/2022 Initial Diagnosis   Malignant neoplasm of upper-outer quadrant of right breast in female, estrogen receptor positive (HCC)    Genetic Testing   [facility] CancerNext-Expanded Panel+RNA was Positive. 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 Report date is 10/04/2022.  The CancerNext-Expanded gene panel offered by [facility] and includes sequencing, rearrangement, and RNA analysis for the following 77 genes: AIP, ALK, APC, ATM, AXIN2, BAP1, BARD1, BLM, BMPR1A, BRCA1, BRCA2, BRIP1, CDC73, CDH1, CDK4, CDKN1B, CDKN2A, CHEK2, CTNNA1, DICER1, FANCC, FH, FLCN, GALNT12, KIF1B, LZTR1, MAX, MEN1, MET, MLH1, MSH2, MSH3, MSH6,  MUTYH, NBN, NF1, NF2, NTHL1, PALB2, PHOX2B, PMS2, POT1, PRKAR1A, PTCH1, PTEN, RAD51C, RAD51D, RB1, RECQL, RET, SDHA, SDHAF2, SDHB, SDHC, SDHD, SMAD4, SMARCA4, SMARCB1, SMARCE1, STK11, SUFU, TMEM127, TP53, TSC1, TSC2, VHL and XRCC2 (sequencing and deletion/duplication); EGFR, EGLN1, HOXB13, KIT, MITF, PDGFRA, POLD1, and POLE (sequencing only); EPCAM and GREM1 (deletion/duplication only).    10/05/2022 Surgery   Right breast lumpectomy with Dr. 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  Grade 2, 2 sentinel lymph nodes negative for metastasis.  Margins negative.  T1c, N0.   10/05/2022 Cancer Staging   Staging form: Breast, AJCC 8th Edition - Pathologic stage from 10/05/2022: Stage IA (pT1c, pN0, cM0, G2, ER+, PR+, HER2+) - Signed by Percival Brace, NP on 11/08/2022 Stage prefix: Initial diagnosis Histologic grading system: 3 grade system   11/08/2022 -  Chemotherapy   Patient is on Treatment Plan : BREAST Paclitaxel  + Trastuzumab  q7d / Trastuzumab  q21d     03/23/2023 - 04/19/2023 Radiation Therapy   Adjuvant radiation     CURRENT THERAPY: Anastrozole   INTERVAL HISTORY: Ira Mann Alyea 73 y.o. female returns for follow up accompanied by her two daughters after hospitalization while she was in [place]  for pneumonia.  She went to the ER for progressive shortness of breath, underwent extensive testing, including echocardiogram, CT chest, cardiac catheterization, and received IV antibiotics and has since then been slowly improving.  She continues to feel fatigued since being discharged and underwent CT chest 12/26/2023 that demonstrates no specific worrisome solid nodule, faint patchy ground-glass densities in the left lung most notable at the apex and anteriorly in the left upper lobe, likely inflammatory or postinflammatory, and atherosclerosis.  Her daughters note that prior to her admission they noted Leeana has developed worsening shortness  of breath on  exertion and are concerned about the potential for COPD which was noted in scans she had at the outside facility.  She has never seen pulmonology or undergone pulmonary function testing.  She is established with Dr. Darla Edward and has f/u with him 01/27/2024.   Patient Active Problem List   Diagnosis Date Noted   Sensorineural hearing loss, bilateral 10/11/2023   Cognitive changes 05/02/2023   Port-A-Cath in place 11/15/2022   Genetic testing 10/04/2022   Monoallelic mutation of ATM gene 16/06/9603   Malignant neoplasm of upper-outer quadrant of right breast in female, estrogen receptor positive (HCC) 09/10/2022   Rash and other nonspecific skin eruption 09/15/2020   Allergic contact dermatitis 09/15/2020   Pruritus 08/14/2020   Dizziness 05/10/2018   Atypical chest pain 05/10/2018   Primary osteoarthritis of left knee 01/06/2018   S/P knee replacement 08/12/2017   Incomplete tear of right rotator cuff 05/19/2017   Osteopenia 05/19/2017   Urge incontinence of urine 05/19/2017   Primary open angle glaucoma (POAG) 12/30/2016   Primary osteoarthritis of both knees 12/30/2016   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  MEDICAL HISTORY: Past Medical History:  Diagnosis Date   Anemia    Anxiety    Arthritis    Breast cancer (HCC) 2024   right breast IDC   Depressed    Dermatitis    rare form, on prednisone   Dyspnea    with exertion mostly   GERD (gastroesophageal reflux disease)    controlled with Omeprazole   Glaucoma    Hypothyroidism    Peripheral neuropathy due to chemotherapy [facility])    Personal history of chemotherapy    Personal history of radiation therapy    Torn rotator cuff    RIGHT    Urinary tract infection    dx 05-26-17 took 1 week  cipro  BID completed 06-02-17.    SURGICAL HISTORY: Past Surgical History:  Procedure Laterality Date   ADENOIDECTOMY     APPENDECTOMY     arthroscopic right knee      BREAST BIOPSY Right 09/01/2022   MM RT BREAST BX W LOC DEV 1ST LESION IMAGE BX SPEC STEREO GUIDE 09/01/2022 [facility]MO[facility]AP[facility]   BREAST BIOPSY  10/01/2022   MM RT RADIOACTIVE SEED LOC MAMMO GUIDE 10/01/2022 [facility]   BREAST LUMPECTOMY     BREAST LUMPECTOMY WITH RADIOACTIVE SEED AND SENTINEL LYMPH NODE BIOPSY Right 10/05/2022   Procedure: RIGHT BREAST LUMPECTOMY WITH RADIOACTIVE SEED AND AXILLARY SENTINEL LYMPH NODE BIOPSY;  Surgeon: Enid Harry, MD;  Location: [facility];  Service: General;  Laterality: Right;   CHOLECYSTECTOMY     COLONOSCOPY  06/06/2017   Pyrtle   Pelvic sling     x2   POLYPECTOMY     PORT A CATH REVISION N/A 11/22/2022   Procedure: PORT A CATH REVISION;  Surgeon: Enid Harry, MD;  Location: [facility];  Service: General;  Laterality: N/A;   PORT-A-CATH REMOVAL Left 11/28/2023   Procedure: REMOVAL PORT-A-CATH;  Surgeon: Enid Harry, MD;  Location: [facility];  Service: General;  Laterality: Left;   PORTACATH PLACEMENT Left 10/05/2022   Procedure: INSERTION PORT-A-CATH;  Surgeon: Enid Harry, MD;  Location: [facility];  Service: General;  Laterality: Left;   ROTATOR  CUFF REPAIR Right    TONSILLECTOMY     TOTAL KNEE ARTHROPLASTY Right 08/12/2017   Procedure: RIGHT TOTAL KNEE ARTHROPLASTY;  Surgeon: Genevie Kerns, MD;  Location: [facility];  Service: Orthopedics;  Laterality: Right;   TOTAL KNEE ARTHROPLASTY Left 01/06/2018   Procedure: LEFT TOTAL KNEE ARTHROPLASTY;  Surgeon: Genevie Kerns, MD;  Location: [facility];  Service: Orthopedics;  Laterality: Left;   UPPER GASTROINTESTINAL ENDOSCOPY     WISDOM TOOTH EXTRACTION      SOCIAL HISTORY: Social History   Socioeconomic History   Marital status: Single    Spouse name: Not  on file   Number of children: Not on file   Years of education: Not on file   Highest education level: Not on file  Occupational History   Not on file  Tobacco Use   Smoking status: Former    Current packs/day: 0.00    Types: Cigarettes    Quit date: 15    Years since quitting: 39.3   Smokeless tobacco: Never  Vaping Use   Vaping status: Never Used  Substance and Sexual Activity   Alcohol use: Yes    Alcohol/week: 2.0 standard drinks of alcohol    Types: 2 Cans of beer per week    Comment: social   Drug use: No   Sexual activity: Not Currently    Birth control/protection: Post-menopausal  Other Topics Concern   Not on file  Social History Narrative   Not on file   Social Drivers of Health   Financial Resource Strain: Low Risk  (12/13/2023)   [facility]   Overall Financial Resource Strain (CARDIA)    Difficulty of Paying Living Expenses: Not hard at all  Food Insecurity: No Food Insecurity (12/13/2023)   Received from [facility]   Hunger Vital Sign    Worried About Running Out of Food in the Last Year: Never true    Ran Out of Food in the Last Year: Never true  Transportation Needs: No Transportation Needs (12/13/2023)   Received from [facility] - Transportation    Lack of Transportation (Medical): No    Lack of Transportation (Non-Medical): No  Physical Activity: Sufficiently Active (08/22/2022)   Received from [facility]   Exercise Vital Sign    Days of Exercise per Week: 3 days    Minutes of Exercise per Session: 50 min  Stress: No Stress Concern Present (07/28/2021)   Received from [facility] - Occupational Stress Questionnaire    Feeling of Stress : Not at all  Social Connections: Socially Integrated (08/22/2022)   Received from [facility]   Social Network    How would you rate your social network (family, work, friends)?: Good  participation with social networks  Intimate Partner Violence: Not At Risk (12/08/2023)   Received from [facility]   Humiliation, Afraid, Rape, and Kick questionnaire    Fear of Current or Ex-Partner: No    Emotionally Abused: No    Physically Abused: No    Sexually Abused: No    FAMILY HISTORY: Family History  Problem Relation Age of Onset   Bowel Disease Mother        ishemic bowel   Lung cancer Mother        smoked   Cervical cancer Paternal Aunt    Cancer Maternal Grandfather        unknown type   Lung cancer Paternal  Grandfather        smoked   Colon cancer Neg Hx    Breast cancer Neg Hx    Esophageal cancer Neg Hx    Pancreatic cancer Neg Hx    Prostate cancer Neg Hx    Rectal cancer Neg Hx    Stomach cancer Neg Hx    Allergic rhinitis Neg Hx    Asthma Neg Hx    Eczema Neg Hx    Urticaria Neg Hx    Colon polyps Neg Hx     Review of Systems  Constitutional:  Positive for fatigue. Negative for appetite change, chills, fever and unexpected weight change.  HENT:   Negative for hearing loss, lump/mass and trouble swallowing.   Eyes:  Negative for eye problems and icterus.  Respiratory:  Positive for shortness of breath. Negative for chest tightness and cough.   Cardiovascular:  Negative for chest pain, leg swelling and palpitations.  Gastrointestinal:  Negative for abdominal distention, abdominal pain, constipation, diarrhea, nausea and vomiting.  Endocrine: Negative for hot flashes.  Genitourinary:  Negative for difficulty urinating.   Musculoskeletal:  Negative for arthralgias.  Skin:  Negative for itching and rash.  Neurological:  Negative for dizziness, extremity weakness, headaches and numbness.  Hematological:  Negative for adenopathy. Does not bruise/bleed easily.  Psychiatric/Behavioral:  Negative for depression. The patient is not nervous/anxious.       PHYSICAL EXAMINATION    Vitals:   01/03/24 1337 01/03/24 1339  BP: (!) 147/73 (!) 147/71   Pulse: (!) 101   Resp: 18   Temp: 98.5 F (36.9 C)   SpO2: 99%     Physical Exam Constitutional:      General: She is not in acute distress.    Appearance: Normal appearance. She is not toxic-appearing.  HENT:     Head: Normocephalic and atraumatic.  Eyes:     General: No scleral icterus. Neurological:     Mental Status: She is alert.  Psychiatric:        Mood and Affect: Mood normal.        Behavior: Behavior normal.      ASSESSMENT and THERAPY PLAN:   Malignant neoplasm of upper-outer quadrant of right breast in female, estrogen receptor positive (HCC) Judy Morrison is a 73 year old with right breast stage IA triple positive breast cancer diagnosed in 08/2022 s/p lumpectomy, adjuvant chemotherapy, maintenance herceptin , adjuvant radiation, and antiestrogen therapy with anastrozole  that began in 04/2023.    Judy Morrison is struggling since her hospital discharge on regaining her strength.  I reached out to Dr. Darla Edward to review her hospitalization with him to see if he can see her earlier than 01/27/2024.  I also placed a referral to pulmonology for consultation and evaluation of her CT chest results and to further explore her shortness of breath and the question of COPD.  She was recommended to undergo sleep testing by her PCP, which she would prefer to do with [facility] than [facility].    Judy Morrison is deconditioned since her cancer treatment and subsequent hospitalization.  We discussed the possibility of rehab.  I recommended chair exercises every hour that she can do.  I suggested that her alarm to remind her of her upcoming trip so that she has motivation for her reconditioning.    Judy Morrison has f/u on 01/12/2024 for her Survivorship Care Plan visit and we will touch base again about her progress and status of the above.     All questions were answered. The  patient knows to call the clinic with any problems, questions or concerns. We can certainly see the patient much sooner  if necessary.  Total encounter time:60 minutes*in face-to-face visit time, chart review, lab review, care coordination, order entry, and documentation of the encounter time.  Alwin Baars, NP 01/08/24 9:00 PM Medical Oncology and Hematology [facility] [address] [place], [place] [address] Tel. [phone]    Fax. [phone]  *Total Encounter Time as defined by the Centers for Medicare and Medicaid Services includes, in addition to the face-to-face time of a patient visit (documented in the note above) non-face-to-face time: obtaining and reviewing outside history, ordering and reviewing medications, tests or procedures, care coordination (communications with other health care professionals or caregivers) and documentation in the medical record.

## 2024-01-08 NOTE — Assessment & Plan Note (Addendum)
 Judy Morrison is a 73 year old with right breast stage IA triple positive breast cancer diagnosed in 08/2022 s/p lumpectomy, adjuvant chemotherapy, maintenance herceptin , adjuvant radiation, and antiestrogen therapy with anastrozole  that began in 04/2023.    Judy Morrison is struggling since her hospital discharge on regaining her strength.  I reached out to Dr. Darla Edward to review her hospitalization with him to see if he can see her earlier than 01/27/2024.  I also placed a referral to pulmonology for consultation and evaluation of her CT chest results and to further explore her shortness of breath and the question of COPD.  She was recommended to undergo sleep testing by her PCP, which she would prefer to do with [facility] rather than [facility].    Judy Morrison is deconditioned since her cancer treatment and subsequent hospitalization.  We discussed the possibility of rehab.  I recommended chair exercises every hour that she can do.  I suggested that her alarm to remind her of her upcoming trip so that she has motivation for her reconditioning.    Judy Morrison has f/u on 01/12/2024 for her Survivorship Care Plan visit and we will touch base again about her progress and status of the above.

## 2024-01-09 ENCOUNTER — Ambulatory Visit: Payer: Medicare Other | Attending: General Surgery

## 2024-01-09 VITALS — Wt 170.4 lb

## 2024-01-09 DIAGNOSIS — Z483 Aftercare following surgery for neoplasm: Secondary | ICD-10-CM | POA: Insufficient documentation

## 2024-01-09 NOTE — Therapy (Signed)
 OUTPATIENT PHYSICAL THERAPY SOZO SCREENING NOTE   Patient Name: Judy Morrison MRN: [id number] DOB:Jan 13, 1951, 73 y.o., female Today's Date: 01/09/2024  PCP: Medicine, [facility] REFERRING PROVIDER: Enid Harry, MD   PT End of Session - 01/09/24 1517     Visit Number 11   # unchanged due to screen only   PT Start Time 1515    PT Stop Time 1519    PT Time Calculation (min) 4 min    Activity Tolerance Patient tolerated treatment well    Behavior During Therapy [facility] for tasks assessed/performed             Past Medical History:  Diagnosis Date   Anemia    Anxiety    Arthritis    Breast cancer (HCC) 2024   right breast IDC   Depressed    Dermatitis    rare form, on prednisone   Dyspnea    with exertion mostly   GERD (gastroesophageal reflux disease)    controlled with Omeprazole   Glaucoma    Hypothyroidism    Peripheral neuropathy due to chemotherapy [facility])    Personal history of chemotherapy    Personal history of radiation therapy    Torn rotator cuff    RIGHT    Urinary tract infection    dx 05-26-17 took 1 week cipro  BID completed 06-02-17.   Past Surgical History:  Procedure Laterality Date   ADENOIDECTOMY     APPENDECTOMY     arthroscopic right knee      BREAST BIOPSY Right 09/01/2022   MM RT BREAST BX W LOC DEV 1ST LESION IMAGE BX SPEC STEREO GUIDE 09/01/2022 [facility]MO[facility]   BREAST BIOPSY  10/01/2022   MM RT RADIOACTIVE SEED LOC MAMMO GUIDE 10/01/2022 [facility]   BREAST LUMPECTOMY     BREAST LUMPECTOMY WITH RADIOACTIVE SEED AND SENTINEL LYMPH NODE BIOPSY Right 10/05/2022   Procedure: RIGHT BREAST LUMPECTOMY WITH RADIOACTIVE SEED AND AXILLARY SENTINEL LYMPH NODE BIOPSY;  Surgeon: Enid Harry, MD;  Location: [facility];  Service: General;  Laterality: Right;   CHOLECYSTECTOMY     COLONOSCOPY  06/06/2017   Pyrtle   Pelvic sling     x2   POLYPECTOMY     PORT A CATH REVISION N/A 11/22/2022    Procedure: PORT A CATH REVISION;  Surgeon: Enid Harry, MD;  Location: [facility] OR;  Service: General;  Laterality: N/A;   PORT-A-CATH REMOVAL Left 11/28/2023   Procedure: REMOVAL PORT-A-CATH;  Surgeon: Enid Harry, MD;  Location: [facility];  Service: General;  Laterality: Left;   PORTACATH PLACEMENT Left 10/05/2022   Procedure: INSERTION PORT-A-CATH;  Surgeon: Enid Harry, MD;  Location: [facility];  Service: General;  Laterality: Left;   ROTATOR CUFF REPAIR Right    TONSILLECTOMY     TOTAL KNEE ARTHROPLASTY Right 08/12/2017   Procedure: RIGHT TOTAL KNEE ARTHROPLASTY;  Surgeon: Genevie Kerns, MD;  [facility];  Service: Orthopedics;  Laterality: Right;   TOTAL KNEE ARTHROPLASTY Left 01/06/2018   Procedure: LEFT TOTAL KNEE ARTHROPLASTY;  Surgeon: Genevie Kerns, MD;  Location[facility];  Service: Orthopedics;  Laterality: Left;   UPPER GASTROINTESTINAL ENDOSCOPY     WISDOM TOOTH EXTRACTION     Patient Active Problem List   Diagnosis Date Noted   Sensorineural hearing loss, bilateral 10/11/2023   Cognitive changes 05/02/2023   Port-A-Cath in place 11/15/2022   Genetic testing 10/04/2022   Monoallelic mutation of ATM gene 40/98/1191   Malignant neoplasm  of upper-outer quadrant of right breast in female, estrogen receptor positive (HCC) 09/10/2022   Rash and other nonspecific skin eruption 09/15/2020   Allergic contact dermatitis 09/15/2020   Pruritus 08/14/2020   Dizziness 05/10/2018   Atypical chest pain 05/10/2018   Primary osteoarthritis of left knee 01/06/2018   S/P knee replacement 08/12/2017   Incomplete tear of right rotator cuff 05/19/2017   Osteopenia 05/19/2017   Urge incontinence of urine 05/19/2017   Primary open angle glaucoma (POAG) 12/30/2016   Primary osteoarthritis of both knees 12/30/2016   Recurrent oral herpes simplex infection 12/30/2016   Tubular adenoma of colon 12/30/2016   Hypothyroidism 06/25/2016    Right foot pain 12/19/2014   Anxiety 05/21/2011   Seasonal allergies 05/21/2011    REFERRING DIAG: right breast cancer at risk for lymphedema  THERAPY DIAG: Aftercare following surgery for neoplasm  PERTINENT HISTORY: Patient was diagnosed on 08/02/2022 with right grade 2 invasive ductal carcinoma breast cancer. It measures 1.5 cm and is located in the upper outer quadrant. It is triple positive with a Ki67 of 5%. She had a rotator cuff repair in her right shoulder 10/01/2021 which continues to be problematic. She had a right lumpectomy with SLNB on 10/05/2022   PRECAUTIONS: right UE Lymphedema risk, None  SUBJECTIVE: Pt returns for her 3 month L-Dex screen .  PAIN:  Are you having pain? No  SOZO SCREENING: Patient was assessed today using the SOZO machine to determine the lymphedema index score. This was compared to her baseline score. It was determined that she is within the recommended range when compared to her baseline and no further action is needed at this time. She will continue SOZO screenings. These are done every 3 months for 2 years post operatively followed by every 6 months for 2 years, and then annually.   L-DEX FLOWSHEETS - 01/09/24 1500       L-DEX LYMPHEDEMA SCREENING   Measurement Type Unilateral    L-DEX MEASUREMENT EXTREMITY Upper Extremity    POSITION  Standing    DOMINANT SIDE Right    At Risk Side Right    BASELINE SCORE (UNILATERAL) -3.4    L-DEX SCORE (UNILATERAL) -3.2    VALUE CHANGE (UNILAT) 0.2              Denyce Flank, PTA 01/09/2024, 3:18 PM

## 2024-01-10 ENCOUNTER — Encounter: Payer: Self-pay | Admitting: *Deleted

## 2024-01-10 ENCOUNTER — Encounter (HOSPITAL_BASED_OUTPATIENT_CLINIC_OR_DEPARTMENT_OTHER): Payer: Self-pay

## 2024-01-10 ENCOUNTER — Encounter: Payer: Self-pay | Admitting: Hematology and Oncology

## 2024-01-11 ENCOUNTER — Encounter: Payer: Self-pay | Admitting: Emergency Medicine

## 2024-01-11 ENCOUNTER — Ambulatory Visit (INDEPENDENT_AMBULATORY_CARE_PROVIDER_SITE_OTHER): Admitting: Emergency Medicine

## 2024-01-11 VITALS — BP 114/76 | HR 86 | Ht 63.0 in | Wt 168.4 lb

## 2024-01-11 DIAGNOSIS — Z87891 Personal history of nicotine dependence: Secondary | ICD-10-CM

## 2024-01-11 DIAGNOSIS — R29818 Other symptoms and signs involving the nervous system: Secondary | ICD-10-CM

## 2024-01-11 DIAGNOSIS — R0600 Dyspnea, unspecified: Secondary | ICD-10-CM

## 2024-01-11 DIAGNOSIS — R0609 Other forms of dyspnea: Secondary | ICD-10-CM

## 2024-01-11 DIAGNOSIS — Z9221 Personal history of antineoplastic chemotherapy: Secondary | ICD-10-CM

## 2024-01-11 DIAGNOSIS — I509 Heart failure, unspecified: Secondary | ICD-10-CM | POA: Diagnosis not present

## 2024-01-11 DIAGNOSIS — Z853 Personal history of malignant neoplasm of breast: Secondary | ICD-10-CM

## 2024-01-11 DIAGNOSIS — R9389 Abnormal findings on diagnostic imaging of other specified body structures: Secondary | ICD-10-CM

## 2024-01-11 DIAGNOSIS — Z923 Personal history of irradiation: Secondary | ICD-10-CM

## 2024-01-11 NOTE — Progress Notes (Signed)
 Subjective:    Patient ID: Judy Morrison, female    DOB: 05-30-1951, 73 y.o.   MRN: [id number]  HPI LALE LAGRASSA is a 73 year old female with a history of breast cancer and recent hospitalization for bilateral pulm infiltrates who presents with shortness of breath. She was referred by Dr. Debbie Fails for evaluation of her respiratory issues.  She was recently hospitalized in [place]  for acute dyspnea with bilateral groundglass pulmonary infiltrates. Initially, she felt short of breath during a trip to [place], leading to an emergency room visit where she received IV antibiotics, diuretics and underwent a cardiac catheterization. A CT scan of her chest showed peripheral interstitial accentuation in the right lung and faint patchy ground glass density in the left lung apex. She required CPAP/BiPAP for 2 days.  Unclear cause, was treated for possible CAP and for cardiogenic pulmonary edema.  She has a history of breast cancer diagnosed in 2023, treated with lumpectomy, chemotherapy, and radiation therapy. She is currently on anastrozole . She felt breathless and wobbly on her feet even before her cancer diagnosis. Post-chemotherapy, she experienced changes in her gait and increased breathlessness.  She has a history of former tobacco use, having smoked a pack a day for about 10-12 years, quitting 35 years ago. There is concern about possible COPD, although she has never been formally diagnosed.  Her daughter notes she has 'weird breathing' during sleep but does not snore or have apneic episodes. She was recommended to have a sleep study, but it has not been done yet.  She feels about 80-85% recovered from her recent illness, having rested for a week after returning home. She has started walking outside but feels she is not back to her full baseline.   RADIOLOGY Chest CT: Hazy areas bilaterally in both lungs (12/07/2023) Chest CT: Peripheral interstitial accentuation anteriorly in the  right lung, faint patchy ground glass density in the left lung apex, no pulmonary nodules (12/26/2023)  DIAGNOSTIC Cardiac catheterization: Clear (12/07/2023) Echocardiogram: Impaired left ventricular function (12/06/2023)   Review of Systems As per HPI  Past Medical History:  Diagnosis Date   Anemia    Anxiety    Arthritis    Breast cancer (HCC) 2024   right breast IDC   Depressed    Dermatitis    rare form, on prednisone   Dyspnea    with exertion mostly   GERD (gastroesophageal reflux disease)    controlled with Omeprazole   Glaucoma    Hypothyroidism    Peripheral neuropathy due to chemotherapy [facility])    Personal history of chemotherapy    Personal history of radiation therapy    Torn rotator cuff    RIGHT    Urinary tract infection    dx 05-26-17 took 1 week cipro  BID completed 06-02-17.     Family History  Problem Relation Age of Onset   Bowel Disease Mother        ishemic bowel   Lung cancer Mother        smoked   Cervical cancer Paternal Aunt    Cancer Maternal Grandfather        unknown type   Lung cancer Paternal Grandfather        smoked   Colon cancer Neg Hx    Breast cancer Neg Hx    Esophageal cancer Neg Hx    Pancreatic cancer Neg Hx    Prostate cancer Neg Hx    Rectal cancer Neg Hx    Stomach  cancer Neg Hx    Allergic rhinitis Neg Hx    Asthma Neg Hx    Eczema Neg Hx    Urticaria Neg Hx    Colon polyps Neg Hx      Social History   Socioeconomic History   Marital status: Single    Spouse name: Not on file   Number of children: Not on file   Years of education: Not on file   Highest education level: Not on file  Occupational History   Not on file  Tobacco Use   Smoking status: Former    Current packs/day: 0.00    Types: Cigarettes    Quit date: 4    Years since quitting: 39.3   Smokeless tobacco: Never  Vaping Use   Vaping status: Never Used  Substance and Sexual Activity   Alcohol use: Yes    Alcohol/week: 2.0 standard  drinks of alcohol    Types: 2 Cans of beer per week    Comment: social   Drug use: No   Sexual activity: Not Currently    Birth control/protection: Post-menopausal  Other Topics Concern   Not on file  Social History Narrative   Not on file   Social Drivers of Health   Financial Resource Strain: Low Risk  (12/13/2023)   Received from [facility]   Overall Financial Resource Strain (CARDIA)    Difficulty of Paying Living Expenses: Not hard at all  Food Insecurity: No Food Insecurity (12/13/2023)   Received from [facility]   Hunger Vital Sign    Worried About Running Out of Food in the Last Year: Never true    Ran Out of Food in the Last Year: Never true  Transportation Needs: No Transportation Needs (12/13/2023)   Received from [facility] - Transportation    Lack of Transportation (Medical): No    Lack of Transportation (Non-Medical): No  Physical Activity: Sufficiently Active (08/22/2022)   Received from [facility], [facility]   Exercise Vital Sign    Days of Exercise per Week: 3 days    Minutes of Exercise per Session: 50 min  Stress: No Stress Concern Present (07/28/2021)   Received from [facility] - Occupational Stress Questionnaire    Feeling of Stress : Not at all  Social Connections: Socially Integrated (08/22/2022)   Received from [facility], [facility]   Social Network    How would you rate your social network (family, work, friends)?: Good participation with social networks  Intimate Partner Violence: Not At Risk (12/08/2023)   Received from [facility]   Humiliation, Afraid, Rape, and Kick questionnaire    Fear of Current or Ex-Partner: No    Emotionally Abused: No    Physically Abused: No    Sexually Abused: No     Allergies  Allergen Reactions   Penicillin G Anaphylaxis   Cephalexin Other (See Comments)    Pt does not remember reaction    Codeine Nausea Only    Erythromycin Itching     Outpatient Medications Prior to Visit  Medication Sig Dispense Refill   Adapalene 0.3 % gel Apply 1 application daily as needed topically (for acne).     ALPRAZolam (XANAX) 0.25 MG tablet Take 0.25 mg by mouth daily as needed for anxiety.   1   anastrozole  (ARIMIDEX ) 1 MG tablet Take 1 tablet (1 mg total) by mouth daily. 90 tablet 3   brexpiprazole  (REXULTI ) 2  MG TABS tablet Take 2 mg by mouth daily after breakfast.     carvedilol  (COREG ) 3.125 MG tablet TAKE 1 TABLET BY MOUTH 2 TIMES DAILY. 180 tablet 1   clobetasol cream (TEMOVATE) 0.05 % Apply 1 Application topically 2 (two) times daily as needed (irritation).     clonazePAM  (KLONOPIN ) 0.5 MG tablet Take 0.5 mg by mouth 2 (two) times daily.     clotrimazole (MYCELEX) 10 MG troche Take 10 mg by mouth as directed. 2 times per week     DULoxetine  (CYMBALTA ) 20 MG capsule Take 20 mg by mouth daily.     ferrous sulfate  325 (65 FE) MG EC tablet Take 325 mg by mouth 3 (three) times daily with meals.     furosemide  (LASIX ) 20 MG tablet Take 1 tablet (20 mg total) by mouth as needed. 30 tablet 3   hydrOXYzine (VISTARIL) 25 MG capsule Take 25-50 mg by mouth daily.     latanoprost  (XALATAN ) 0.005 % ophthalmic solution Place 1 drop into both eyes at bedtime.     levothyroxine  (SYNTHROID , LEVOTHROID) 50 MCG tablet Take 50 mcg at bedtime by mouth.      lidocaine -prilocaine  (EMLA ) cream Apply 1 Application to affected area topically once daily. 30 g 3   loratadine  (CLARITIN ) 10 MG tablet Take 10 mg by mouth daily.     losartan  (COZAAR ) 25 MG tablet Take 1 tablet (25 mg total) by mouth daily. 90 tablet 3   nortriptyline  (PAMELOR ) 50 MG capsule Take 100 mg at bedtime by mouth.      omeprazole (PRILOSEC) 40 MG capsule Take 40 mg by mouth daily before breakfast.     OVER THE COUNTER MEDICATION 20 mLs by Mouth Rinse route 4 (four) times daily as needed (mouth pain). Oral-B Sore Mouth Rinse     phenazopyridine (PYRIDIUM) 200 MG tablet  Take 200 mg by mouth 3 (three) times daily as needed.     simvastatin (ZOCOR) 10 MG tablet Take 10 mg by mouth daily.     tacrolimus (PROGRAF) 1 MG capsule Take 1 mg by mouth 2 (two) times daily. Per patient - Dissolve capsule in one half liter of H2O. Swish and spit twice daily     triamcinolone cream (KENALOG) 0.1 % Apply 1 Application topically 2 (two) times daily as needed (irritation).     valACYclovir (VALTREX) 1000 MG tablet Take 1,000 mg by mouth daily as needed (outbreaks).     Facility-Administered Medications Prior to Visit  Medication Dose Route Frequency Provider Last Rate Last Admin   sodium chloride  flush (NS) 0.9 % injection 10 mL  10 mL Intracatheter PRN Murleen Arms, MD            Objective:   Physical Exam Vitals:   01/11/24 1526  BP: 114/76  Pulse: 86  SpO2: 95%  Weight: 168 lb 6.4 oz (76.4 kg)  Height: 5\' 3"  (1.6 m)    Gen: Pleasant, well-nourished, in no distress,  normal affect  ENT: No lesions,  mouth clear,  oropharynx clear, no postnasal drip  Neck: No JVD, no stridor  Lungs: No use of accessory muscles, no crackles or wheezing on normal respiration, no wheeze on forced expiration  Cardiovascular: RRR, heart sounds normal, no murmur or gallops, no peripheral edema  Musculoskeletal: No deformities, no cyanosis or clubbing  Neuro: alert, awake, non focal  Skin: Warm, no lesions or rash       Assessment & Plan:   Dyspnea on exertion Some progressive exertional shortness of  breath but then with acute decompensation when she was on a trip in [place]  that was associated with bilateral pulmonary filtrates.  Unclear whether this reflected acute viral or bacterial pneumonia, noninfectious inflammatory process, cardiogenic pulmonary edema.  Her echocardiogram does show evidence for decreased LV function and some diastolic dysfunction in the aftermath of her chemotherapy.  Cardiac catheterization in [place]  was reassuring.  She improved with  antibiotics, corticosteroids as well as diuretics.  She will need both pulmonary and cardiac testing, monitoring.  Cardiac evaluation is already underway.  We will arrange for pulmonary function testing to assess for any contribution of underlying obstructive lung disease.   Suspected sleep apnea Patient's daughter mentions an abnormal respiratory pattern at night while sleeping.  She does not snore, gets up good night sleep and does not feel tired in the morning.  She does take naps.  Based on this we may decide to pursue sleep study at some point going forward.  Will continue discussed with her.    Racheal Buddle, MD, PhD 01/12/2024, 8:56 AM [facility] [phone] or if no answer before 7:00PM call [phone] For any issues after 7:00PM please call eLink [phone]

## 2024-01-11 NOTE — Patient Instructions (Addendum)
 VISIT SUMMARY:  Today, we discussed your recent health issues, including your shortness of breath, history of pneumonia, and ongoing treatment for breast cancer. We reviewed your recent hospitalization and the treatments you received, and we have outlined a plan to address your respiratory and cardiac concerns.  YOUR PLAN:  -ABNORMAL CT CHEST: Suspect pneumonia versus acute fluid in the lungs due to inefficient heart function.  Your repeat CT scan of the chest done recently shows significant improvement with some very subtle changes that are likely related to resolved inflammation or your history of radiation.  -TOBACCO USE: Your history of smoking may have affected your lungs, even though you quit 35 years ago. We will order pulmonary function tests to assess for any tobacco-related lung disease.  -HEART FAILURE: Heart failure means your heart is not pumping blood as well as it should. This may be related to your chemotherapy. We will follow up with your cardiologist to manage your heart function.  -BREAST CANCER, CURRENT TREATMENT: You were diagnosed with breast cancer in 2023 and have undergone surgery, chemotherapy, and radiation. You are currently taking anastrozole , and there is no evidence of cancer recurrence.  INSTRUCTIONS:  Please schedule and complete the pulmonary function tests as soon as possible. Follow up with your cardiologist to manage your heart function. Continue taking anastrozole  as prescribed. If you experience any new or worsening symptoms, contact our office immediately. Return to office in 1 month

## 2024-01-12 ENCOUNTER — Telehealth: Payer: Self-pay

## 2024-01-12 ENCOUNTER — Encounter: Payer: Self-pay | Admitting: Adult Health

## 2024-01-12 ENCOUNTER — Inpatient Hospital Stay: Payer: Medicare Other | Attending: Hematology and Oncology | Admitting: Adult Health

## 2024-01-12 VITALS — BP 124/72 | HR 70 | Temp 98.7°F | Resp 18 | Ht 63.0 in | Wt 167.7 lb

## 2024-01-12 DIAGNOSIS — Z9221 Personal history of antineoplastic chemotherapy: Secondary | ICD-10-CM | POA: Insufficient documentation

## 2024-01-12 DIAGNOSIS — Z1721 Progesterone receptor positive status: Secondary | ICD-10-CM | POA: Diagnosis not present

## 2024-01-12 DIAGNOSIS — Z87891 Personal history of nicotine dependence: Secondary | ICD-10-CM | POA: Diagnosis not present

## 2024-01-12 DIAGNOSIS — Z8049 Family history of malignant neoplasm of other genital organs: Secondary | ICD-10-CM | POA: Insufficient documentation

## 2024-01-12 DIAGNOSIS — Z79811 Long term (current) use of aromatase inhibitors: Secondary | ICD-10-CM | POA: Insufficient documentation

## 2024-01-12 DIAGNOSIS — Z801 Family history of malignant neoplasm of trachea, bronchus and lung: Secondary | ICD-10-CM | POA: Diagnosis not present

## 2024-01-12 DIAGNOSIS — J45909 Unspecified asthma, uncomplicated: Secondary | ICD-10-CM | POA: Insufficient documentation

## 2024-01-12 DIAGNOSIS — Z1509 Genetic susceptibility to other malignant neoplasm: Secondary | ICD-10-CM

## 2024-01-12 DIAGNOSIS — C50411 Malignant neoplasm of upper-outer quadrant of right female breast: Secondary | ICD-10-CM

## 2024-01-12 DIAGNOSIS — Z1501 Genetic susceptibility to malignant neoplasm of breast: Secondary | ICD-10-CM

## 2024-01-12 DIAGNOSIS — R0609 Other forms of dyspnea: Secondary | ICD-10-CM | POA: Insufficient documentation

## 2024-01-12 DIAGNOSIS — Z923 Personal history of irradiation: Secondary | ICD-10-CM | POA: Insufficient documentation

## 2024-01-12 DIAGNOSIS — Z1731 Human epidermal growth factor receptor 2 positive status: Secondary | ICD-10-CM | POA: Insufficient documentation

## 2024-01-12 DIAGNOSIS — Z170 Estrogen receptor positive status [ER+]: Secondary | ICD-10-CM

## 2024-01-12 DIAGNOSIS — Z1589 Genetic susceptibility to other disease: Secondary | ICD-10-CM | POA: Diagnosis not present

## 2024-01-12 DIAGNOSIS — R29818 Other symptoms and signs involving the nervous system: Secondary | ICD-10-CM | POA: Insufficient documentation

## 2024-01-12 HISTORY — DX: Unspecified asthma, uncomplicated: J45.909

## 2024-01-12 NOTE — Assessment & Plan Note (Signed)
 Patient's daughter mentions an abnormal respiratory pattern at night while sleeping.  She does not snore, gets up good night sleep and does not feel tired in the morning.  She does take naps.  Based on this we may decide to pursue sleep study at some point going forward.  Will continue discussed with her.

## 2024-01-12 NOTE — Telephone Encounter (Signed)
 Pt scheduled to see Dr Bridgett Camps 02/28/24@2 :30pm.

## 2024-01-12 NOTE — Progress Notes (Signed)
 SURVIVORSHIP VISIT:  BRIEF ONCOLOGIC HISTORY:  Oncology History  Malignant neoplasm of upper-outer quadrant of right breast in female, estrogen receptor positive (HCC)  08/02/2022 Mammogram   In the right breast, possible distortion warrants further evaluation. In the left breast, no findings suspicious for malignancy. Diagnostic mammogram showed persistent subtle distortion central slightly LATERAL aspect of the RIGHT breast warranting tissue diagnosis.     09/01/2022 Pathology Results   Final pathology showed grade 2 invasive mammary carcinoma, prognostic showed ER 90% positive strong staining PR 90% positive strong staining, Ki-67 of 5% and HER2 3+ by [facility]   09/10/2022 Initial Diagnosis   Malignant neoplasm of upper-outer quadrant of right breast in female, estrogen receptor positive (HCC)    Genetic Testing   [facility] CancerNext-Expanded Panel+RNA was Positive. 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 Report date is 10/04/2022.  The CancerNext-Expanded gene panel offered by [facility] and includes sequencing, rearrangement, and RNA analysis for the following 77 genes: AIP, ALK, APC, ATM, AXIN2, BAP1, BARD1, BLM, BMPR1A, BRCA1, BRCA2, BRIP1, CDC73, CDH1, CDK4, CDKN1B, CDKN2A, CHEK2, CTNNA1, DICER1, FANCC, FH, FLCN, GALNT12, KIF1B, LZTR1, MAX, MEN1, MET, MLH1, MSH2, MSH3, MSH6, MUTYH, NBN, NF1, NF2, NTHL1, PALB2, PHOX2B, PMS2, POT1, PRKAR1A, PTCH1, PTEN, RAD51C, RAD51D, RB1, RECQL, RET, SDHA, SDHAF2, SDHB, SDHC, SDHD, SMAD4, SMARCA4, SMARCB1, SMARCE1, STK11, SUFU, TMEM127, TP53, TSC1, TSC2, VHL and XRCC2 (sequencing and deletion/duplication); EGFR, EGLN1, HOXB13, KIT, MITF, PDGFRA, POLD1, and POLE (sequencing only); EPCAM and GREM1 (deletion/duplication only).    10/05/2022 Surgery   Right breast lumpectomy with Dr. 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   Grade 2, 2 sentinel lymph nodes negative for metastasis.  Margins negative.  T1c, N0.   10/05/2022 Cancer Staging   Staging form: Breast, AJCC 8th Edition - Pathologic stage from 10/05/2022: Stage IA (pT1c, pN0, cM0, G2, ER+, PR+, HER2+) - Signed by Percival Brace, NP on 11/08/2022 Stage prefix: Initial diagnosis Histologic grading system: 3 grade system   11/08/2022 -  Chemotherapy   Patient is on Treatment Plan : BREAST Paclitaxel  + Trastuzumab  q7d / Trastuzumab  q21d     03/23/2023 - 04/19/2023 Radiation Therapy   Plan Name: Breast_R Site: Breast, Right Technique: 3D Mode: Photon Dose Per Fraction: 2.66 Gy Prescribed Dose (Delivered / Prescribed): 42.56 Gy / 42.56 Gy Prescribed Fxs (Delivered / Prescribed): 16 / 16   Plan Name: Breast_R_Bst Site: Breast, Right Technique: 3D Mode: Photon Dose Per Fraction: 2 Gy Prescribed Dose (Delivered / Prescribed): 8 Gy / 8 Gy Prescribed Fxs (Delivered / Prescribed): 4 / 4   05/2023 -  Anti-estrogen oral therapy   Anastrozole      INTERVAL HISTORY:  Judy Morrison to review her survivorship care plan detailing her treatment course for breast cancer, as well as monitoring long-term side effects of that treatment, education regarding health maintenance, screening, and overall wellness and health promotion.     Overall, Judy Morrison reports feeling quite well. At her last visit she had been previously been hospitalized for pulmonary issues.  She was referred to pulmonology and was recently evaluated by Dr. Baldwin Levee.  She is scheduled for PFTs on 5/7 along with same day follow up.    She continues on Anastrozole  daily and feels like she is regaining her energy and strength daily.  She walked twice last week.    REVIEW OF SYSTEMS:  Review of Systems  Constitutional:  Negative for appetite  change, chills, fatigue, fever and unexpected weight change.  HENT:   Negative for hearing loss, lump/mass and trouble swallowing.   Eyes:  Negative for  eye problems and icterus.  Respiratory:  Negative for chest tightness, cough and shortness of breath.   Cardiovascular:  Negative for chest pain, leg swelling and palpitations.  Gastrointestinal:  Negative for abdominal distention, abdominal pain, constipation, diarrhea, nausea and vomiting.  Endocrine: Negative for hot flashes.  Genitourinary:  Negative for difficulty urinating.   Musculoskeletal:  Negative for arthralgias.  Skin:  Negative for itching and rash.  Neurological:  Negative for dizziness, extremity weakness, headaches and numbness.  Hematological:  Negative for adenopathy. Does not bruise/bleed easily.  Psychiatric/Behavioral:  Negative for depression. The patient is not nervous/anxious.    Breast: Denies any new nodularity, masses, tenderness, nipple changes, or nipple discharge.       PAST MEDICAL/SURGICAL HISTORY:  Past Medical History:  Diagnosis Date   Anemia    Anxiety    Arthritis    Breast cancer (HCC) 2024   right breast IDC   Depressed    Dermatitis    rare form, on prednisone   Dyspnea    with exertion mostly   GERD (gastroesophageal reflux disease)    controlled with Omeprazole   Glaucoma    Hypothyroidism    Peripheral neuropathy due to chemotherapy [facility])    Personal history of chemotherapy    Personal history of radiation therapy    Torn rotator cuff    RIGHT    Urinary tract infection    dx 05-26-17 took 1 week cipro  BID completed 06-02-17.   Past Surgical History:  Procedure Laterality Date   ADENOIDECTOMY     APPENDECTOMY     arthroscopic right knee      BREAST BIOPSY Right 09/01/2022   MM RT BREAST BX W LOC DEV 1ST LESION IMAGE BX SPEC STEREO GUIDE 09/01/2022 [facility]   BREAST BIOPSY  10/01/2022   MM RT RADIOACTIVE SEED LOC MAMMO GUIDE 10/01/2022 [facility]   BREAST LUMPECTOMY     BREAST LUMPECTOMY WITH RADIOACTIVE SEED AND SENTINEL LYMPH NODE BIOPSY Right 10/05/2022   Procedure: RIGHT BREAST LUMPECTOMY WITH  RADIOACTIVE SEED AND AXILLARY SENTINEL LYMPH NODE BIOPSY;  Surgeon: Enid Harry, MD;  Location: [facility];  Service: General;  Laterality: Right;   CHOLECYSTECTOMY     COLONOSCOPY  06/06/2017   Pyrtle   Pelvic sling     x2   POLYPECTOMY     PORT A CATH REVISION N/A 11/22/2022   Procedure: PORT A CATH REVISION;  Surgeon: Enid Harry, MD;  Location: [facility] OR;  Service: General;  Laterality: N/A;   PORT-A-CATH REMOVAL Left 11/28/2023   Procedure: REMOVAL PORT-A-CATH;  Surgeon: Enid Harry, MD;  Location: [facility];  Service: General;  Laterality: Left;   PORTACATH PLACEMENT Left 10/05/2022   Procedure: INSERTION PORT-A-CATH;  Surgeon: Enid Harry, MD;  Location: [facility];  Service: General;  Laterality: Left;   ROTATOR CUFF REPAIR Right    TONSILLECTOMY     TOTAL KNEE ARTHROPLASTY Right 08/12/2017   Procedure: RIGHT TOTAL KNEE ARTHROPLASTY;  Surgeon: Genevie Kerns, MD;  Location: [facility];  Service: Orthopedics;  Laterality: Right;   TOTAL KNEE ARTHROPLASTY Left 01/06/2018   Procedure: LEFT TOTAL KNEE ARTHROPLASTY;  Surgeon: Genevie Kerns, MD;  Location: [facility];  Service: Orthopedics;  Laterality: Left;   UPPER GASTROINTESTINAL ENDOSCOPY     WISDOM TOOTH EXTRACTION  ALLERGIES:  Allergies  Allergen Reactions   Penicillin G Anaphylaxis   Cephalexin Other (See Comments)    Pt does not remember reaction    Codeine Nausea Only   Erythromycin Itching     CURRENT MEDICATIONS:  Outpatient Encounter Medications as of 01/12/2024  Medication Sig   Adapalene 0.3 % gel Apply 1 application daily as needed topically (for acne).   ALPRAZolam (XANAX) 0.25 MG tablet Take 0.25 mg by mouth daily as needed for anxiety.    anastrozole  (ARIMIDEX ) 1 MG tablet Take 1 tablet (1 mg total) by mouth daily.   carvedilol  (COREG ) 3.125 MG tablet TAKE 1 TABLET BY MOUTH 2 TIMES DAILY.   clobetasol cream (TEMOVATE) 0.05 % Apply 1  Application topically 2 (two) times daily as needed (irritation).   clonazePAM  (KLONOPIN ) 0.5 MG tablet Take 0.5 mg by mouth 2 (two) times daily.   clotrimazole (MYCELEX) 10 MG troche Take 10 mg by mouth as directed. 2 times per week   DULoxetine  (CYMBALTA ) 20 MG capsule Take 20 mg by mouth daily.   ferrous sulfate  325 (65 FE) MG EC tablet Take 325 mg by mouth 3 (three) times daily with meals.   furosemide  (LASIX ) 20 MG tablet Take 1 tablet (20 mg total) by mouth as needed.   hydrOXYzine (VISTARIL) 25 MG capsule Take 25-50 mg by mouth daily.   latanoprost  (XALATAN ) 0.005 % ophthalmic solution Place 1 drop into both eyes at bedtime.   levothyroxine  (SYNTHROID , LEVOTHROID) 50 MCG tablet Take 50 mcg at bedtime by mouth.    lidocaine -prilocaine  (EMLA ) cream Apply 1 Application to affected area topically once daily.   loratadine  (CLARITIN ) 10 MG tablet Take 10 mg by mouth daily.   losartan  (COZAAR ) 25 MG tablet Take 1 tablet (25 mg total) by mouth daily.   nortriptyline  (PAMELOR ) 50 MG capsule Take 100 mg at bedtime by mouth.    omeprazole (PRILOSEC) 40 MG capsule Take 40 mg by mouth daily before breakfast.   OVER THE COUNTER MEDICATION 20 mLs by Mouth Rinse route 4 (four) times daily as needed (mouth pain). Oral-B Sore Mouth Rinse   phenazopyridine (PYRIDIUM) 200 MG tablet Take 200 mg by mouth 3 (three) times daily as needed.   simvastatin (ZOCOR) 10 MG tablet Take 10 mg by mouth daily.   tacrolimus (PROGRAF) 1 MG capsule Take 1 mg by mouth 2 (two) times daily. Per patient - Dissolve capsule in one half liter of H2O. Swish and spit twice daily   triamcinolone cream (KENALOG) 0.1 % Apply 1 Application topically 2 (two) times daily as needed (irritation).   valACYclovir (VALTREX) 1000 MG tablet Take 1,000 mg by mouth daily as needed (outbreaks).   brexpiprazole  (REXULTI ) 2 MG TABS tablet Take 2 mg by mouth daily after breakfast. (Patient not taking: Reported on 01/12/2024)   Facility-Administered  Encounter Medications as of 01/12/2024  Medication   sodium chloride  flush (NS) 0.9 % injection 10 mL     ONCOLOGIC FAMILY HISTORY:  Family History  Problem Relation Age of Onset   Bowel Disease Mother        ishemic bowel   Lung cancer Mother        smoked   Cervical cancer Paternal Aunt    Cancer Maternal Grandfather        unknown type   Lung cancer Paternal Grandfather        smoked   Colon cancer Neg Hx    Breast cancer Neg Hx    Esophageal cancer Neg Hx  Pancreatic cancer Neg Hx    Prostate cancer Neg Hx    Rectal cancer Neg Hx    Stomach cancer Neg Hx    Allergic rhinitis Neg Hx    Asthma Neg Hx    Eczema Neg Hx    Urticaria Neg Hx    Colon polyps Neg Hx      SOCIAL HISTORY:  Social History   Socioeconomic History   Marital status: Single    Spouse name: Not on file   Number of children: Not on file   Years of education: Not on file   Highest education level: Not on file  Occupational History   Not on file  Tobacco Use   Smoking status: Former    Current packs/day: 0.00    Types: Cigarettes    Quit date: 4    Years since quitting: 39.3   Smokeless tobacco: Never  Vaping Use   Vaping status: Never Used  Substance and Sexual Activity   Alcohol use: Yes    Alcohol/week: 2.0 standard drinks of alcohol    Types: 2 Cans of beer per week    Comment: social   Drug use: No   Sexual activity: Not Currently    Birth control/protection: Post-menopausal  Other Topics Concern   Not on file  Social History Narrative   Not on file   Social Drivers of Health   Financial Resource Strain: Low Risk  (12/13/2023)   Received from [facility]   Overall Financial Resource Strain (CARDIA)    Difficulty of Paying Living Expenses: Not hard at all  Food Insecurity: No Food Insecurity (12/13/2023)   Received from [facility]   Hunger Vital Sign    Worried About Running Out of Food in the Last Year: Never true    Ran Out of Food in the Last Year: Never true   Transportation Needs: No Transportation Needs (12/13/2023)   Received from [facility] - Transportation    Lack of Transportation (Medical): No    Lack of Transportation (Non-Medical): No  Physical Activity: Sufficiently Active (08/22/2022)   Received from [facility], [facility]   Exercise Vital Sign    Days of Exercise per Week: 3 days    Minutes of Exercise per Session: 50 min  Stress: No Stress Concern Present (07/28/2021)   Received from [facility], [facility] of Occupational Health - Occupational Stress Questionnaire    Feeling of Stress : Not at all  Social Connections: Socially Integrated (08/22/2022)   Received from [facility], [facility]   Social Network    How would you rate your social network (family, work, friends)?: Good participation with social networks  Intimate Partner Violence: Not At Risk (12/08/2023)   Received from [facility]   Humiliation, Afraid, Rape, and Kick questionnaire    Fear of Current or Ex-Partner: No    Emotionally Abused: No    Physically Abused: No    Sexually Abused: No     OBSERVATIONS/OBJECTIVE:  BP 124/72   Pulse 70   Temp 98.7 F (37.1 C) (Temporal)   Resp 18   Ht 5\' 3"  (1.6 m)   Wt 167 lb 11.2 oz (76.1 kg)   SpO2 100%   BMI 29.71 kg/m  GENERAL: Patient is a well appearing female in no acute distress HEENT:  Sclerae anicteric.  Oropharynx clear and moist. No ulcerations or evidence of oropharyngeal candidiasis. Neck is supple.  NODES:  No cervical, supraclavicular, or axillary  lymphadenopathy palpated.  BREAST EXAM:  Deferred. LUNGS:  Clear to auscultation bilaterally.  No wheezes or rhonchi. HEART:  Regular rate and rhythm. No murmur appreciated. ABDOMEN:  Soft, nontender.  Positive, normoactive bowel sounds. No organomegaly palpated. MSK:  No focal spinal tenderness to palpation. Full range of motion bilaterally in the upper extremities. EXTREMITIES:  No peripheral edema.    SKIN:  Clear with no obvious rashes or skin changes. No nail dyscrasia. NEURO:  Nonfocal. Well oriented.  Appropriate affect.   LABORATORY DATA:  None for this visit.  DIAGNOSTIC IMAGING:  None for this visit.      ASSESSMENT AND PLAN:  Ms.. Morrison is a pleasant 73 y.o. female with Stage IA right breast invasive ductal carcinoma, ER+/PR+/HER2+, diagnosed in 08/2022, treated with lumpectomy, adjuvant chemotherapy, maintenance herceptin , adjuvant radiation therapy, and anti-estrogen therapy with Anastrozole  beginning in 05/2023.  She presents to the Survivorship Clinic for our initial meeting and routine follow-up post-completion of treatment for breast cancer.    1. Stage IA right breast cancer:  Judy Morrison is continuing to recover from definitive treatment for breast cancer. She will follow-up with her medical oncologist, Dr.  Arno Bibles in 3 months with history and physical exam per surveillance protocol.  She will continue her anti-estrogen therapy with Anastrozole . Thus far, she is tolerating the Anastrozole  well, with minimal side effects. Her mammogram is due 08/2024; orders placed today.   We ordered Guardant reveal testing for minimal residual disease monitoring.    Today, a comprehensive survivorship care plan and treatment summary was reviewed with the patient today detailing her breast cancer diagnosis, treatment course, potential late/long-term effects of treatment, appropriate follow-up care with recommendations for the future, and patient education resources.  A copy of this summary, along with a letter will be sent to the patient's primary care provider via mail/fax/In Basket message after today's visit.    2. ATM mutation: Pancreatic cancer screening guidelines were updated in 11/2023 and now recommend pancreatic cancer screening in patients with ATM mutation without family history of pancreatic cancer.  I placed orders for intensified breat and pancreas screening for an MRI breast  bilaterally w/wo contrast and and MRI abdomen w/wo contrast with MRCP.  I sent a message to Dr. Bridgett Camps who would prefer to see Anari back after MRCP to review and to evaluate whether to add EUS alternating with MRI.    3. Bone health:  Given Judy Morrison's age/history of breast cancer and her current treatment regimen including anti-estrogen therapy with Anastrozole , she is at risk for bone demineralization.  Her last DEXA scan was 04/2023 and demonstrated osteopenia with a t score of -2.4 in the right femur.  Repeat is recommended ot occur in 04/2025.  She was given education on specific activities to promote bone health.  4. Cancer screening:  Due to Judy Morrison's history and her age, she should receive screening for skin cancers and gynecologic cancers.  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 The information and recommendations are listed on the patient's comprehensive care plan/treatment summary and were reviewed in detail with the patient.    5. Health maintenance and wellness promotion: Judy Morrison was encouraged to consume 5-7 servings of fruits and vegetables per day. We reviewed the "Nutrition Rainbow" handout.  She was also encouraged to engage in moderate to vigorous exercise for 30 minutes per day most days of the week.  She was instructed to limit her alcohol consumption  and continue to abstain from tobacco use.     6. Support services/counseling: It is not uncommon for this period of the patient's cancer care trajectory to be one of many emotions and stressors.   She was given information regarding our available services and encouraged to contact me with any questions or for help enrolling in any of our support group/programs.    Follow up instructions:    -Return to cancer center in 6 months for f/u with Dr. Arno Bibles  -Mammogram due in 08/2024 -Breast MRI 02/2024 -MRCP 02/2024 -Referral back to Dr. Bridgett Camps for pancreatic cancer screening  guidance -She is welcome to return back to the Survivorship Clinic at any time; no additional follow-up needed at this time.  -Consider referral back to survivorship as a long-term survivor for continued surveillance  The patient was provided an opportunity to ask questions and all were answered. The patient agreed with the plan and demonstrated an understanding of the instructions.   Total encounter time:45 minutes*in face-to-face visit time, chart review, lab review, care coordination, order entry, and documentation of the encounter time.  Alwin Baars, NP 01/12/24 10:39 AM Medical Oncology and Hematology [facility] [address] [place], [place] [address] Tel. [phone]    Fax. [phone]  *Total Encounter Time as defined by the Centers for Medicare and Medicaid Services includes, in addition to the face-to-face time of a patient visit (documented in the note above) non-face-to-face time: obtaining and reviewing outside history, ordering and reviewing medications, tests or procedures, care coordination (communications with other health care professionals or caregivers) and documentation in the medical record.

## 2024-01-12 NOTE — Telephone Encounter (Signed)
-----   Message from Amber Bail Pyrtle sent at 01/12/2024  2:07 PM EDT ----- I am okay managing.  Would likely be smart to alternate MRI with EUS I agree with starting with MRI abdomen with and without contrast plus MRCP Thank you JMP  Pod A, routine follow-up with me not urgent ----- Message ----- From: Percival Brace, NP Sent: 01/12/2024  11:13 AM EDT To: Nannette Babe, MD  Hey there,  I hope you are well.  This patient has an ATM mutation.  Recent guidelines have changed that now recommend MRCP and or EUS annually.  Since she needs annual breast MRI, I am going to go ahead and order her first MRCP.  Do you want to see her back in future, or would you prefer I refer to Dr. Karene Oto who manages most of the high risk pancreatic cancer patients?  Thanks,   Loris Ros

## 2024-01-12 NOTE — Assessment & Plan Note (Signed)
 Some progressive exertional shortness of breath but then with acute decompensation when she was on a trip in [place]  that was associated with bilateral pulmonary filtrates.  Unclear whether this reflected acute viral or bacterial pneumonia, noninfectious inflammatory process, cardiogenic pulmonary edema.  Her echocardiogram does show evidence for decreased LV function and some diastolic dysfunction in the aftermath of her chemotherapy.  Cardiac catheterization in [place]  was reassuring.  She improved with antibiotics, corticosteroids as well as diuretics.  She will need both pulmonary and cardiac testing, monitoring.  Cardiac evaluation is already underway.  We will arrange for pulmonary function testing to assess for any contribution of underlying obstructive lung disease.

## 2024-01-13 ENCOUNTER — Telehealth: Payer: Self-pay

## 2024-01-13 NOTE — Telephone Encounter (Signed)
 Per md orders entered for Guardant Reveal and all supported documents faxed to [phone]. Faxed confirmation was received.

## 2024-01-18 ENCOUNTER — Ambulatory Visit: Admitting: Emergency Medicine

## 2024-01-18 ENCOUNTER — Ambulatory Visit (INDEPENDENT_AMBULATORY_CARE_PROVIDER_SITE_OTHER): Admitting: Emergency Medicine

## 2024-01-18 ENCOUNTER — Encounter: Payer: Self-pay | Admitting: Emergency Medicine

## 2024-01-18 VITALS — BP 116/75 | HR 80 | Ht 63.0 in | Wt 166.0 lb

## 2024-01-18 DIAGNOSIS — Z87891 Personal history of nicotine dependence: Secondary | ICD-10-CM

## 2024-01-18 DIAGNOSIS — R0600 Dyspnea, unspecified: Secondary | ICD-10-CM

## 2024-01-18 DIAGNOSIS — R0609 Other forms of dyspnea: Secondary | ICD-10-CM | POA: Diagnosis not present

## 2024-01-18 DIAGNOSIS — J309 Allergic rhinitis, unspecified: Secondary | ICD-10-CM | POA: Insufficient documentation

## 2024-01-18 DIAGNOSIS — J301 Allergic rhinitis due to pollen: Secondary | ICD-10-CM | POA: Diagnosis not present

## 2024-01-18 LAB — PULMONARY FUNCTION TEST
DL/VA % pred: 129 %
DL/VA: 5.37 ml/min/mmHg/L
DLCO cor % pred: 103 %
DLCO cor: 19.15 ml/min/mmHg
DLCO unc % pred: 88 %
DLCO unc: 16.38 ml/min/mmHg
FEF 25-75 Post: 1.37 L/s
FEF 25-75 Pre: 0.92 L/s
FEF2575-%Change-Post: 49 %
FEF2575-%Pred-Post: 83 %
FEF2575-%Pred-Pre: 55 %
FEV1-%Change-Post: 13 %
FEV1-%Pred-Post: 72 %
FEV1-%Pred-Pre: 63 %
FEV1-Post: 1.48 L
FEV1-Pre: 1.31 L
FEV1FVC-%Change-Post: 2 %
FEV1FVC-%Pred-Pre: 97 %
FEV6-%Change-Post: 10 %
FEV6-%Pred-Post: 75 %
FEV6-%Pred-Pre: 68 %
FEV6-Post: 1.97 L
FEV6-Pre: 1.78 L
FEV6FVC-%Pred-Post: 105 %
FEV6FVC-%Pred-Pre: 105 %
FVC-%Change-Post: 10 %
FVC-%Pred-Post: 72 %
FVC-%Pred-Pre: 65 %
FVC-Post: 1.97 L
FVC-Pre: 1.78 L
Post FEV1/FVC ratio: 75 %
Post FEV6/FVC ratio: 100 %
Pre FEV1/FVC ratio: 74 %
Pre FEV6/FVC Ratio: 100 %
RV % pred: 107 %
RV: 2.38 L
TLC % pred: 95 %
TLC: 4.66 L

## 2024-01-18 MED ORDER — BUDESONIDE-FORMOTEROL FUMARATE 80-4.5 MCG/ACT IN AERO: 2.0000 | INHALATION_SPRAY | Freq: Two times a day (BID) | RESPIRATORY_TRACT | 12 refills | Status: AC

## 2024-01-18 NOTE — Progress Notes (Signed)
 Full pft performed today.

## 2024-01-18 NOTE — Progress Notes (Signed)
   Subjective:    Patient ID: Judy Morrison, female    DOB: 31-Mar-1951, 73 y.o.   MRN: [id number]  HPI  ROV 01/18/2024 --73 year old woman with history of breast cancer whom I saw you recently after an acute hospitalization for bilateral pulmonary infiltrates with associated dyspnea concerning for either viral/bacterial infection or diastolic CHF.  This was in [place] .  Some suspicion for possible underlying lung disease given her remote tobacco use.  We arrange pulmonary function testing. She has been experiencing increased allergy  symptoms since I last saw her, having She is on loratadine , not really having a lot of PND or cough  Pulmonary function testing performed 01/18/2024 and reviewed by me shows mixed obstruction and restriction with a borderline bronchodilator response.  FEV1 1.31 L (63% predicted).  Normal lung volumes.  Normal diffusion capacity.  The flow-volume loop is suggestive of obstruction.    RADIOLOGY Chest CT: Hazy areas bilaterally in both lungs (12/07/2023) Chest CT: Peripheral interstitial accentuation anteriorly in the right lung, faint patchy ground glass density in the left lung apex, no pulmonary nodules (12/26/2023)  DIAGNOSTIC Cardiac catheterization: Clear (12/07/2023) Echocardiogram: Impaired left ventricular function (12/06/2023)   Review of Systems As per HPI      Objective:   Physical Exam Vitals:   01/18/24 1304  BP: 116/75  Pulse: 80  SpO2: 97%  Weight: 166 lb (75.3 kg)  Height: 5\' 3"  (1.6 m)    Gen: Pleasant, well-nourished, in no distress,  normal affect  ENT: No lesions,  mouth clear,  oropharynx clear, no postnasal drip  Neck: No JVD, no stridor  Lungs: No use of accessory muscles, no crackles or wheezing on normal respiration, no wheeze on forced expiration  Cardiovascular: RRR, heart sounds normal, no murmur or gallops, no peripheral edema  Musculoskeletal: No deformities, no cyanosis or clubbing  Neuro: alert, awake, non  focal  Skin: Warm, no lesions or rash  Time spent 42 minutes     Assessment & Plan:   Dyspnea on exertion Pulmonary function testing consistent with obstruction on spirometry with possible superimposed restriction.  The lung volumes are normal.  There is a borderline bronchodilator response suggestive of an asthma phenotype.  She is willing to do a trial of scheduled therapy to see if it helps with her daily symptoms.  Will try Symbicort 80.  She will also use albuterol to see if she gets benefit with exertion.  She will follow-up in 3 months to talk about how she likes the medication   We reviewed your pulmonary function testing today.  There are some evidence for some airflow obstruction that might benefit from medication. Please try starting Symbicort 2 puffs twice a day.  Rinse and gargle after using.  Keep track of how this medication helps your breathing and your functional capacity.  If you benefit then we will plan to continue it going forward. Keep albuterol available to use 2 puffs if needed for shortness of breath, chest tightness, wheezing. Follow in our office in about 3 months so we can discuss how you are doing on the medication.  Allergic rhinitis Please continue your loratadine  10 mg once daily.     Racheal Buddle, MD, PhD 01/18/2024, 1:36 PM [facility] [phone] or if no answer before 7:00PM call [phone] For any issues after 7:00PM please call eLink [phone]

## 2024-01-18 NOTE — Assessment & Plan Note (Signed)
Please continue your loratadine 10 mg once daily.

## 2024-01-18 NOTE — Patient Instructions (Signed)
 We reviewed your pulmonary function testing today.  There are some evidence for some airflow obstruction that might benefit from medication. Please try starting Symbicort 2 puffs twice a day.  Rinse and gargle after using.  Keep track of how this medication helps your breathing and your functional capacity.  If you benefit then we will plan to continue it going forward. Keep albuterol available to use 2 puffs if needed for shortness of breath, chest tightness, wheezing. Please continue your loratadine  10 mg once daily. Follow in our office in about 3 months so we can discuss how you are doing on the medication.

## 2024-01-18 NOTE — Assessment & Plan Note (Signed)
 Pulmonary function testing consistent with obstruction on spirometry with possible superimposed restriction.  The lung volumes are normal.  There is a borderline bronchodilator response suggestive of an asthma phenotype.  She is willing to do a trial of scheduled therapy to see if it helps with her daily symptoms.  Will try Symbicort 80.  She will also use albuterol to see if she gets benefit with exertion.  She will follow-up in 3 months to talk about how she likes the medication   We reviewed your pulmonary function testing today.  There are some evidence for some airflow obstruction that might benefit from medication. Please try starting Symbicort 2 puffs twice a day.  Rinse and gargle after using.  Keep track of how this medication helps your breathing and your functional capacity.  If you benefit then we will plan to continue it going forward. Keep albuterol available to use 2 puffs if needed for shortness of breath, chest tightness, wheezing. Follow in our office in about 3 months so we can discuss how you are doing on the medication.

## 2024-01-18 NOTE — Patient Instructions (Signed)
 Full pft performed today.

## 2024-01-26 ENCOUNTER — Telehealth (HOSPITAL_COMMUNITY): Payer: Self-pay | Admitting: Cardiology

## 2024-01-26 NOTE — Telephone Encounter (Signed)
 Called to confirm/remind patient of their appointment at the [facility] on 01/26/2024.   Appointment:   [] Confirmed  [x] Left mess   [] No answer/No voice mail  [] VM Full/unable to leave message  [] Phone not in service  Patient reminded to bring all medications and/or complete list.  Confirmed patient has transportation. Gave directions, instructed to utilize valet parking.

## 2024-01-27 ENCOUNTER — Ambulatory Visit (HOSPITAL_COMMUNITY)
Admission: RE | Admit: 2024-01-27 | Discharge: 2024-01-27 | Disposition: A | Discharge status: Home / Self Care | Source: Ambulatory Visit | Attending: Adult Health | Admitting: Adult Health

## 2024-01-27 ENCOUNTER — Ambulatory Visit (HOSPITAL_COMMUNITY): Payer: Self-pay | Admitting: Adult Health

## 2024-01-27 ENCOUNTER — Ambulatory Visit (HOSPITAL_BASED_OUTPATIENT_CLINIC_OR_DEPARTMENT_OTHER)

## 2024-01-27 VITALS — BP 122/84 | HR 84 | Wt 172.4 lb

## 2024-01-27 DIAGNOSIS — Z9221 Personal history of antineoplastic chemotherapy: Secondary | ICD-10-CM | POA: Diagnosis not present

## 2024-01-27 DIAGNOSIS — I447 Left bundle-branch block, unspecified: Secondary | ICD-10-CM | POA: Insufficient documentation

## 2024-01-27 DIAGNOSIS — R0609 Other forms of dyspnea: Secondary | ICD-10-CM | POA: Diagnosis not present

## 2024-01-27 DIAGNOSIS — Z79899 Other long term (current) drug therapy: Secondary | ICD-10-CM | POA: Diagnosis not present

## 2024-01-27 DIAGNOSIS — R0600 Dyspnea, unspecified: Secondary | ICD-10-CM | POA: Insufficient documentation

## 2024-01-27 DIAGNOSIS — R600 Localized edema: Secondary | ICD-10-CM | POA: Diagnosis not present

## 2024-01-27 DIAGNOSIS — Z853 Personal history of malignant neoplasm of breast: Secondary | ICD-10-CM | POA: Diagnosis not present

## 2024-01-27 DIAGNOSIS — R0602 Shortness of breath: Secondary | ICD-10-CM | POA: Diagnosis not present

## 2024-01-27 DIAGNOSIS — I5021 Acute systolic (congestive) heart failure: Secondary | ICD-10-CM

## 2024-01-27 DIAGNOSIS — E611 Iron deficiency: Secondary | ICD-10-CM | POA: Insufficient documentation

## 2024-01-27 LAB — BASIC METABOLIC PANEL WITH GFR
Anion gap: 9 (ref 5–15)
BUN: 10 mg/dL (ref 8–23)
CO2: 26 mmol/L (ref 22–32)
Calcium: 9.1 mg/dL (ref 8.9–10.3)
Chloride: 101 mmol/L (ref 98–111)
Creatinine, Ser: 0.84 mg/dL (ref 0.44–1.00)
GFR, Estimated: >60 mL/min (ref >60)
Glucose, Bld: 94 mg/dL (ref 70–99)
Potassium: 4.8 mmol/L (ref 3.5–5.1)
Sodium: 136 mmol/L (ref 135–145)

## 2024-01-27 LAB — ECHOCARDIOGRAM COMPLETE
AR max vel: 2.16 cm2
AV Area VTI: 2.43 cm2
AV Area mean vel: 2.14 cm2
AV Mean grad: 8 mmHg
AV Peak grad: 16.6 mmHg
AV Vena cont: 0.5 cm
Ao pk vel: 2.04 m/s
Area-P 1/2: 3.54 cm2
P 1/2 time: 588 ms
S' Lateral: 2.86 cm
Weight: 2758.4 [oz_av]

## 2024-01-27 LAB — BRAIN NATRIURETIC PEPTIDE: B Natriuretic Peptide: 103.1 pg/mL — ABNORMAL HIGH (ref 0.0–100.0)

## 2024-01-27 MED ORDER — LOSARTAN POTASSIUM 25 MG PO TABS: 25.0000 mg | ORAL_TABLET | Freq: Every evening | ORAL | Status: DC

## 2024-01-27 NOTE — Progress Notes (Signed)
 ADVANCED HEART FAILURE CLINIC NOTE  Referring Physician: Medicine, [facility]*  Primary Care: Medicine, [facility] Family Oncologist: Dr. Arno Bibles  HPI: Judy Morrison is a 73 y.o. female with breast cancer . Her cancer history dates back to November 2023 when screening mammogram demonstrated right breast irregularity.  The following months she had a pathology confirmed grade 2 invasive mammary carcinoma.  She underwent right breast lumpectomy by Dr. 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  She was started on chemotherapy in November 08, 2022 with paclitaxel , trastuzumab .  Completed Herceptin  10/2023  Admitted March 2025 [place] with CAP. Had LHC. Normal coronaries.   Saw Dr Baldwin Levee for ongoing dyspnea. Started on inhalers.   Interval hx:  - Today she returns for HF follow up with her daughter. Remains SOB with exertion. SOB with steps. Denies PND/Orthopnea. She has had a couple low blood pressures.  Symptomatic when SBP is in the 90s. Appetite ok. No fever or chills. Weight at home has been stable. Taking all medications.  Current Outpatient Medications  Medication Sig Dispense Refill   Adapalene 0.3 % gel Apply 1 application daily as needed topically (for acne).     ALPRAZolam (XANAX) 0.25 MG tablet Take 0.25 mg by mouth daily as needed for anxiety.   1   amphetamine-dextroamphetamine (ADDERALL) 10 MG tablet Take 10 mg by mouth daily with breakfast.     anastrozole  (ARIMIDEX ) 1 MG tablet Take 1 tablet (1 mg total) by mouth daily. 90 tablet 3   brexpiprazole  (REXULTI ) 2 MG TABS tablet Take 2 mg by mouth daily after breakfast.     budesonide -formoterol  (SYMBICORT ) 80-4.5 MCG/ACT inhaler Inhale 2 puffs into the lungs in the morning and at bedtime. 1 each 12   carvedilol  (COREG ) 3.125 MG tablet TAKE 1 TABLET BY MOUTH 2 TIMES DAILY. 180 tablet 1   clobetasol cream (TEMOVATE) 0.05  % Apply 1 Application topically 2 (two) times daily as needed (irritation).     clonazePAM  (KLONOPIN ) 0.5 MG tablet Take 0.5 mg by mouth 2 (two) times daily.     clotrimazole (MYCELEX) 10 MG troche Take 10 mg by mouth as directed. 2 times per week     DULoxetine  (CYMBALTA ) 20 MG capsule Take 20 mg by mouth daily.     ferrous sulfate  325 (65 FE) MG EC tablet Take 325 mg by mouth 3 (three) times daily with meals.     furosemide  (LASIX ) 20 MG tablet Take 1 tablet (20 mg total) by mouth as needed. 30 tablet 3   hydrOXYzine (VISTARIL) 25 MG capsule Take 25-50 mg by mouth daily.     latanoprost  (XALATAN ) 0.005 % ophthalmic solution Place 1 drop into both eyes at bedtime.     levothyroxine  (SYNTHROID , LEVOTHROID) 50 MCG tablet Take 50 mcg at bedtime by mouth.      loratadine  (CLARITIN ) 10 MG tablet Take 10 mg by mouth daily.     losartan  (COZAAR ) 25 MG tablet Take 1 tablet (25 mg total) by mouth daily. 90 tablet 3   meloxicam (MOBIC) 7.5 MG tablet Take 7.5 mg by mouth daily.     nortriptyline  (PAMELOR ) 50 MG capsule Take 100 mg at bedtime by mouth.      omeprazole (PRILOSEC) 40 MG capsule Take 40 mg by mouth daily before breakfast.     OVER THE COUNTER MEDICATION 20 mLs by Mouth Rinse route 4 (four) times daily as needed (mouth pain). Oral-B  Sore Mouth Rinse     phenazopyridine (PYRIDIUM) 200 MG tablet Take 200 mg by mouth 3 (three) times daily as needed.     simvastatin (ZOCOR) 10 MG tablet Take 10 mg by mouth daily.     tacrolimus (PROGRAF) 1 MG capsule Take 1 mg by mouth 2 (two) times daily. Per patient - Dissolve capsule in one half liter of H2O. Swish and spit twice daily     triamcinolone cream (KENALOG) 0.1 % Apply 1 Application topically 2 (two) times daily as needed (irritation).     valACYclovir (VALTREX) 1000 MG tablet Take 1,000 mg by mouth daily as needed (outbreaks).     No current facility-administered medications for this encounter.   Facility-Administered Medications Ordered in Other  Encounters  Medication Dose Route Frequency Provider Last Rate Last Admin   sodium chloride  flush (NS) 0.9 % injection 10 mL  10 mL Intracatheter PRN Iruku, Praveena, MD         PHYSICAL EXAM: Vitals:   01/27/24 1122  BP: 122/84  Pulse: 84  SpO2: 94%   Wt Readings from Last 3 Encounters:  01/27/24 78.2 kg (172 lb 6.4 oz)  01/18/24 75.3 kg (166 lb)  01/12/24 76.1 kg (167 lb 11.2 oz)   General:   No resp difficulty Neck: supple. no JVD.  Cor: PMI nondisplaced. Regular rate & rhythm. No rubs, gallops or murmurs. Lungs: clear Abdomen: soft, nontender, nondistended.  Extremities: no cyanosis, clubbing, rash, R and LLE trace edema Neuro: alert & oriented x3  DATA REVIEW  ECG: 10/02/21: NSR, LVH  As per my personal interpretation  ECHO: 08/19/23: LVEF 55%-60%, grade I DD 02/14/23: LVEF 50-55, GLS -13%, normal RV function as per my personal interpretation 10/18/22: LVEF 55%, GLS -15% normal RV function, mild AI 04/05/23: LVEF 65%, normal RV function, mild AI, GLS -19.9%.  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  GLS has suboptimal tracing and is mostly unchanged.   -TTE  on 08/19/2023 with LVEF of 60% and grade 1 diastolic dysfunction. -repeat TTE  (11/29/23) with reduction in LVEF to 45%.  -NYHA III. Remains short of breath with exertion. Some of this may be from recent pneumonia. We discussed RHC versus CPX to further sort out ongoing dyspnea.  -Volume status appears stable. Continue lasix  as needed. Would use no more than 2  times a week. Continue compression stockings to help with lower extremity edema.  -Continue coreg  3.125mg  BID -Continue  losartan  25 mg daily. Change to bedtime.  Of note this was recently cut back.   - Repeat ECHO  Check BMET/BNP.  2.  Invasive ductal  carcinoma -Following with Dr. Arno Bibles -Completed Herceptin  on 11/01/23  3. Iron deficiency  -  ferritin of 8, iron 53, TIBC 515 on 11/01/23  I spent 30 minutres counseling/coordination of care regarding medications and Echo.We discussed RHC and CPX test to further assess dyspnea.   Follow up in 4 weeks with Dr Bruce Caper with an ECHO.    Nika Yazzie NP-C 11:27 AM

## 2024-01-27 NOTE — Patient Instructions (Signed)
 Change Losartan  to nightly.  Labs done today, your results will be available in MyChart, we will contact you for abnormal readings.  Your physician has requested that you have an echocardiogram. Echocardiography is a painless test that uses sound waves to create images of your heart. It provides your doctor with information about the size and shape of your heart and how well your heart's chambers and valves are working. This procedure takes approximately one hour. There are no restrictions for this procedure. Please do NOT wear cologne, perfume, aftershave, or lotions (deodorant is allowed). Please arrive 15 minutes prior to your appointment time.  Please note: We ask at that you not bring children with you during ultrasound (echo/ vascular) testing. Due to room size and safety concerns, children are not allowed in the ultrasound rooms during exams. Our front office staff cannot provide observation of children in our lobby area while testing is being conducted. An adult accompanying a patient to their appointment will only be allowed in the ultrasound room at the discretion of the ultrasound technician under special circumstances. We apologize for any inconvenience. You will be called to have this test arranged.  Your physician recommends that you schedule a follow-up appointment in: 1 month.  If you have any questions or concerns before your next appointment please send us  a message through [place] or call our office at [phone].    TO LEAVE A MESSAGE FOR THE NURSE SELECT OPTION 2, PLEASE LEAVE A MESSAGE INCLUDING: YOUR NAME DATE OF BIRTH CALL BACK NUMBER REASON FOR CALL**this is important as we prioritize the call backs  YOU WILL RECEIVE A CALL BACK THE SAME DAY AS LONG AS YOU CALL BEFORE 4:00 PM  At the [facility], you and your health needs are our priority. As part of our continuing mission to provide you with exceptional heart care, we have created designated  Provider Care Teams. These Care Teams include your primary Cardiologist (physician) and Advanced Practice Providers (APPs- Physician Assistants and Nurse Practitioners) who all work together to provide you with the care you need, when you need it.   You may see any of the following providers on your designated Care Team at your next follow up: Dr Jules Oar Dr Peder Bourdon Dr. Alwin Baars Dr. Arta Lark Amy Marijane Shoulders, NP Ruddy Corral, [place] [facility] [place], [place] Dennise Fitz, NP [place] Lee, NP Shawnee Dellen, NP Luster Salters, PharmD Bevely Brush, PharmD   Please be sure to bring in all your medications bottles to every appointment.    Thank you for choosing [facility]

## 2024-01-30 ENCOUNTER — Telehealth: Payer: Self-pay

## 2024-01-30 NOTE — Telephone Encounter (Signed)
 Called pt per MD to advise [facility] testing was negative/not detected. Pt verbalized understanding of results and knows [facility] will be in touch to schedule 6 mo repeat lab.

## 2024-01-31 ENCOUNTER — Encounter: Payer: Self-pay | Admitting: Adult Health

## 2024-02-15 ENCOUNTER — Other Ambulatory Visit (INDEPENDENT_AMBULATORY_CARE_PROVIDER_SITE_OTHER): Payer: Self-pay | Admitting: Otolaryngology

## 2024-02-15 DIAGNOSIS — R42 Dizziness and giddiness: Secondary | ICD-10-CM

## 2024-02-16 ENCOUNTER — Other Ambulatory Visit: Payer: Self-pay | Admitting: *Deleted

## 2024-02-16 ENCOUNTER — Encounter: Payer: Self-pay | Admitting: Adult Health

## 2024-02-16 DIAGNOSIS — R2689 Other abnormalities of gait and mobility: Secondary | ICD-10-CM

## 2024-02-16 DIAGNOSIS — R4189 Other symptoms and signs involving cognitive functions and awareness: Secondary | ICD-10-CM

## 2024-02-17 ENCOUNTER — Encounter: Payer: Self-pay | Admitting: Physician Assistant

## 2024-02-18 ENCOUNTER — Ambulatory Visit
Admission: RE | Admit: 2024-02-18 | Discharge: 2024-02-18 | Disposition: A | Discharge status: Home / Self Care | Source: Ambulatory Visit | Attending: Adult Health | Admitting: Adult Health

## 2024-02-18 DIAGNOSIS — Z1509 Genetic susceptibility to other malignant neoplasm: Secondary | ICD-10-CM

## 2024-02-18 DIAGNOSIS — Z170 Estrogen receptor positive status [ER+]: Secondary | ICD-10-CM

## 2024-02-18 MED ORDER — GADOPICLENOL 0.5 MMOL/ML IV SOLN
7.5000 mL | Freq: Once | INTRAVENOUS | Status: AC | PRN
  Administered 2024-02-18: 7.5 mL via INTRAVENOUS

## 2024-02-20 ENCOUNTER — Ambulatory Visit
Admission: RE | Admit: 2024-02-20 | Discharge: 2024-02-20 | Disposition: A | Discharge status: Home / Self Care | Source: Ambulatory Visit | Attending: Adult Health | Admitting: Adult Health

## 2024-02-20 DIAGNOSIS — Z1501 Genetic susceptibility to malignant neoplasm of breast: Secondary | ICD-10-CM

## 2024-02-20 DIAGNOSIS — C50411 Malignant neoplasm of upper-outer quadrant of right female breast: Secondary | ICD-10-CM

## 2024-02-20 MED ORDER — GADOPICLENOL 0.5 MMOL/ML IV SOLN
7.5000 mL | Freq: Once | INTRAVENOUS | Status: AC | PRN
  Administered 2024-02-20: 7.5 mL via INTRAVENOUS

## 2024-02-22 ENCOUNTER — Ambulatory Visit: Payer: Self-pay | Admitting: *Deleted

## 2024-02-24 NOTE — Telephone Encounter (Signed)
Attempted to call pt per NP. LVM for call back.  

## 2024-02-24 NOTE — Telephone Encounter (Signed)
-----   Message from Conway Dennis sent at 02/24/2024 12:01 PM EDT ----- Please let patient know that MRI is negative for pancreatic cancer.  Good news.   ----- Message ----- From: Interface, Rad Results In Sent: 02/24/2024  11:14 AM EDT To: Percival Brace, NP

## 2024-02-27 ENCOUNTER — Telehealth (HOSPITAL_COMMUNITY): Payer: Self-pay | Admitting: Cardiology

## 2024-02-27 NOTE — Progress Notes (Signed)
 ADVANCED HEART FAILURE CLINIC NOTE  Referring Physician: Medicine, [facility]*  Primary Care: Medicine, [facility] Family Oncologist: Dr. Loretha  HPI: Judy Morrison is a 73 y.o. female with breast cancer presenting today to for follow up.  Her cancer history dates back to November 2023 when screening mammogram demonstrated right breast irregularity.  The following months she had a pathology confirmed grade 2 invasive mammary carcinoma.  She underwent right breast lumpectomy by Dr. 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  She was started on chemotherapy in November 08, 2022 with paclitaxel , trastuzumab .  Interval hx:  - Today during her appointment we had her daughter on the phone. According to both of them, she has exertional dyspnea. Reports getting short of breath when walking up the driveway  and when bending over. TTE on 01/27/24 unremarkable with improvement in LVEF.   - Daughter reports significant cognitive delays now; feels that she is slowing down.   Current Outpatient Medications  Medication Sig Dispense Refill   Adapalene 0.3 % gel Apply 1 application daily as needed topically (for acne).     ALPRAZolam (XANAX) 0.25 MG tablet Take 0.25 mg by mouth daily as needed for anxiety.   1   amphetamine-dextroamphetamine (ADDERALL) 10 MG tablet Take 10 mg by mouth daily with breakfast.     anastrozole  (ARIMIDEX ) 1 MG tablet Take 1 tablet (1 mg total) by mouth daily. 90 tablet 3   brexpiprazole  (REXULTI ) 2 MG TABS tablet Take 2 mg by mouth daily after breakfast.     budesonide -formoterol  (SYMBICORT ) 80-4.5 MCG/ACT inhaler Inhale 2 puffs into the lungs in the morning and at bedtime. 1 each 12   carvedilol  (COREG ) 3.125 MG tablet TAKE 1 TABLET BY MOUTH 2 TIMES DAILY. 180 tablet 1   clobetasol cream (TEMOVATE) 0.05 % Apply 1 Application topically 2 (two) times daily as needed  (irritation).     clonazePAM  (KLONOPIN ) 0.5 MG tablet Take 0.5 mg by mouth 2 (two) times daily.     clotrimazole (MYCELEX) 10 MG troche Take 10 mg by mouth as directed. 2 times per week     DULoxetine  (CYMBALTA ) 20 MG capsule Take 20 mg by mouth daily.     ferrous sulfate  325 (65 FE) MG tablet Take 325 mg by mouth every other day.     furosemide  (LASIX ) 20 MG tablet Take 1 tablet (20 mg total) by mouth as needed. 30 tablet 3   hydrOXYzine (VISTARIL) 25 MG capsule Take 25-50 mg by mouth daily.     latanoprost  (XALATAN ) 0.005 % ophthalmic solution Place 1 drop into both eyes at bedtime.     levothyroxine  (SYNTHROID , LEVOTHROID) 50 MCG tablet Take 50 mcg at bedtime by mouth.      loratadine  (CLARITIN ) 10 MG tablet Take 10 mg by mouth daily.     losartan  (COZAAR ) 25 MG tablet Take 1 tablet (25 mg total) by mouth at bedtime.     meloxicam (MOBIC) 7.5 MG tablet Take 7.5 mg by mouth daily.     nortriptyline  (PAMELOR ) 50 MG capsule Take 100 mg at bedtime by mouth.      omeprazole (PRILOSEC) 40 MG capsule Take 40 mg by mouth daily before breakfast.     OVER THE COUNTER MEDICATION 20 mLs by Mouth Rinse route 4 (four) times daily as needed (mouth pain). Oral-B Sore Mouth Rinse     phenazopyridine (PYRIDIUM) 200 MG tablet Take 200 mg by  mouth 3 (three) times daily as needed.     simvastatin (ZOCOR) 10 MG tablet Take 10 mg by mouth daily.     tacrolimus (PROGRAF) 1 MG capsule Take 1 mg by mouth 2 (two) times daily. Per patient - Dissolve capsule in one half liter of H2O. Swish and spit twice daily     triamcinolone cream (KENALOG) 0.1 % Apply 1 Application topically 2 (two) times daily as needed (irritation).     valACYclovir (VALTREX) 1000 MG tablet Take 1,000 mg by mouth daily as needed (outbreaks).     No current facility-administered medications for this encounter.   Facility-Administered Medications Ordered in Other Encounters  Medication Dose Route Frequency Provider Last Rate Last Admin   sodium  chloride flush (NS) 0.9 % injection 10 mL  10 mL Intracatheter PRN Judy Morrison, Praveena, MD         PHYSICAL EXAM: Vitals:   02/28/24 1023  BP: 118/80  Pulse: 91  SpO2: 96%   GENERAL: NAD Lungs- CTA CARDIAC:  JVP: 7 cm          Normal rate with regular rhythm. no murmur.  Pulses 2+. no edema.  ABDOMEN: Soft, non-tender, non-distended.  EXTREMITIES: Warm and well perfused.  NEUROLOGIC: No obvious FND   DATA REVIEW  ECG: 10/02/21: NSR, LVH  As per my personal interpretation  ECHO: 08/19/23: LVEF 55%-60%, grade I DD 02/14/23: LVEF 50-55, GLS -13%, normal RV function as per my personal interpretation 10/18/22: LVEF 55%, GLS -15% normal RV function, mild AI 04/05/23: LVEF 65%, normal RV function, mild AI, GLS -19.9%.    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  GLS has suboptimal tracing and is mostly unchanged.   -TTE  on 08/19/2023 with LVEF of 60% and grade 1 diastolic dysfunction. -repeat TTE today (11/29/23) with reduction in LVEF to 45%. This can be secondary to herceptin  and/or underlying LBBB. If it is secondary to herceptin , I suspect LVEF will recover over the next few months with GDMT. - TTE on 5/25 with LVEF of 60-65%.  -continue coreg  3.125mg  BID -continue losartan  50mg  daily  - Reviewed labs from 01/27/2024: BNP 103, serum creatinine 0.84  2.  Invasive ductal carcinoma -Following with Dr. Loretha -Completed Herceptin  on 11/01/23 - TTE with LVEF of 60 to 65%.  3. Iron deficiency  - Feels fatigued; ferritin of 8, iron 53, TIBC 515 on 11/01/23 - Will discuss iron infusion with Dr. Loretha  4. Dyspnea - Continue using albuterol inhaler.  Followed by pulmonology.  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 Memory loss /  confusion  - Spoke with daughter extensively on the phone according to her, Ms. Judy Morrison has had some degree of cognitive decline over the past several months.  We went through her medications in depth.  She is on multiple medications that could lead to this including Klonopin , Vistaril, Xanax and amitriptyline.  She is currently using Vistaril quite frequently and uses Klonopin .  We discussed possibly trying to wean down the amount that she is using.  They will discuss with their PCP  I spent 60 minutes caring for this patient today including face to face time, ordering and reviewing labs, reviewing records noted above, discussing memory loss, reviewing echos, seeing the patient, documenting in the record, and  arranging follow ups.   Britaney Espaillat Advanced Heart Failure Mechanical Circulatory Support

## 2024-02-27 NOTE — Telephone Encounter (Signed)
 Called to confirm/remind patient of their appointment at the [facility] on 02/27/24.   Appointment:   [x] Confirmed  [] Left mess   [] No answer/No voice mail  [] VM Full/unable to leave message  [] Phone not in service  Patient reminded to bring all medications and/or complete list.  Confirmed patient has transportation. Gave directions, instructed to utilize valet parking.

## 2024-02-28 ENCOUNTER — Encounter (HOSPITAL_COMMUNITY): Payer: Self-pay | Admitting: Cardiology

## 2024-02-28 ENCOUNTER — Ambulatory Visit: Admitting: Internal Medicine

## 2024-02-28 ENCOUNTER — Encounter: Payer: Self-pay | Admitting: Internal Medicine

## 2024-02-28 ENCOUNTER — Ambulatory Visit (HOSPITAL_COMMUNITY)
Admission: RE | Admit: 2024-02-28 | Discharge: 2024-02-28 | Disposition: A | Discharge status: Home / Self Care | Source: Ambulatory Visit | Attending: Cardiology | Admitting: Cardiology

## 2024-02-28 VITALS — BP 118/80 | HR 91 | Wt 170.0 lb

## 2024-02-28 VITALS — BP 116/76 | HR 91 | Ht 63.0 in | Wt 170.0 lb

## 2024-02-28 DIAGNOSIS — I447 Left bundle-branch block, unspecified: Secondary | ICD-10-CM | POA: Insufficient documentation

## 2024-02-28 DIAGNOSIS — R0600 Dyspnea, unspecified: Secondary | ICD-10-CM | POA: Diagnosis not present

## 2024-02-28 DIAGNOSIS — R4189 Other symptoms and signs involving cognitive functions and awareness: Secondary | ICD-10-CM

## 2024-02-28 DIAGNOSIS — R1312 Dysphagia, oropharyngeal phase: Secondary | ICD-10-CM | POA: Diagnosis not present

## 2024-02-28 DIAGNOSIS — Z79899 Other long term (current) drug therapy: Secondary | ICD-10-CM | POA: Diagnosis not present

## 2024-02-28 DIAGNOSIS — C50911 Malignant neoplasm of unspecified site of right female breast: Secondary | ICD-10-CM | POA: Insufficient documentation

## 2024-02-28 DIAGNOSIS — Z853 Personal history of malignant neoplasm of breast: Secondary | ICD-10-CM

## 2024-02-28 DIAGNOSIS — J449 Chronic obstructive pulmonary disease, unspecified: Secondary | ICD-10-CM | POA: Diagnosis not present

## 2024-02-28 DIAGNOSIS — K219 Gastro-esophageal reflux disease without esophagitis: Secondary | ICD-10-CM | POA: Diagnosis not present

## 2024-02-28 DIAGNOSIS — Z5181 Encounter for therapeutic drug level monitoring: Secondary | ICD-10-CM | POA: Diagnosis not present

## 2024-02-28 DIAGNOSIS — Z9221 Personal history of antineoplastic chemotherapy: Secondary | ICD-10-CM

## 2024-02-28 DIAGNOSIS — Z860100 Personal history of colon polyps, unspecified: Secondary | ICD-10-CM

## 2024-02-28 DIAGNOSIS — R635 Abnormal weight gain: Secondary | ICD-10-CM

## 2024-02-28 DIAGNOSIS — R413 Other amnesia: Secondary | ICD-10-CM | POA: Insufficient documentation

## 2024-02-28 DIAGNOSIS — I5032 Chronic diastolic (congestive) heart failure: Secondary | ICD-10-CM | POA: Diagnosis not present

## 2024-02-28 DIAGNOSIS — E611 Iron deficiency: Secondary | ICD-10-CM | POA: Diagnosis not present

## 2024-02-28 DIAGNOSIS — Z87891 Personal history of nicotine dependence: Secondary | ICD-10-CM

## 2024-02-28 DIAGNOSIS — R410 Disorientation, unspecified: Secondary | ICD-10-CM | POA: Insufficient documentation

## 2024-02-28 DIAGNOSIS — Z1501 Genetic susceptibility to malignant neoplasm of breast: Secondary | ICD-10-CM

## 2024-02-28 MED ORDER — OMEPRAZOLE 40 MG PO CPDR: 40.0000 mg | DELAYED_RELEASE_CAPSULE | Freq: Every day | ORAL | 3 refills | Status: AC

## 2024-02-28 NOTE — Patient Instructions (Signed)
 _______________________________________________________  If your blood pressure at your visit was 140/90 or greater, please contact your primary care physician to follow up on this.  _______________________________________________________  If you are age 75 or older, your body mass index should be between 23-30. Your Body mass index is 30.11 kg/m. If this is out of the aforementioned range listed, please consider follow up with your Primary Care Provider.  If you are age 54 or younger, your body mass index should be between 19-25. Your Body mass index is 30.11 kg/m. If this is out of the aformentioned range listed, please consider follow up with your Primary Care Provider.   ________________________________________________________  The [facility] providers would like to encourage you to use MYCHART to communicate with providers for non-urgent requests or questions.  Due to long hold times on the telephone, sending your provider a message by [facility] may be a faster and more efficient way to get a response.  Please allow 48 business hours for a response.  Please remember that this is for non-urgent requests.  _______________________________________________________  We have sent the following medications to your pharmacy for you to pick up at your convenience:  Omeprazole 40mg  one capsule daily  You will need an EUS with Dr Brice Campi in May 2026.  We will contact you to schedule this appointment.  Thank you for entrusting me with your care and choosing [facility].  Dr Bridgett Camps

## 2024-02-28 NOTE — Patient Instructions (Signed)
 Great to see you today!!!  No changes, please continue current medications  Your physician recommends that you schedule a follow-up appointment in: 3 months  If you have any questions or concerns before your next appointment please send us  a message through [place] or call our office at [phone].    TO LEAVE A MESSAGE FOR THE NURSE SELECT OPTION 2, PLEASE LEAVE A MESSAGE INCLUDING: YOUR NAME DATE OF BIRTH CALL BACK NUMBER REASON FOR CALL**this is important as we prioritize the call backs  YOU WILL RECEIVE A CALL BACK THE SAME DAY AS LONG AS YOU CALL BEFORE 4:00 PM  At the [facility], you and your health needs are our priority. As part of our continuing mission to provide you with exceptional heart care, we have created designated Provider Care Teams. These Care Teams include your primary Cardiologist (physician) and Advanced Practice Providers (APPs- Physician Assistants and Nurse Practitioners) who all work together to provide you with the care you need, when you need it.   You may see any of the following providers on your designated Care Team at your next follow up: Dr Jules Oar Dr Peder Bourdon Dr. Alwin Baars Dr. Arta Lark Amy Marijane Shoulders, NP Ruddy Corral, [place] [facility] [place], [place] Dennise Fitz, NP [place] Lee, NP Shawnee Dellen, NP Luster Salters, PharmD Bevely Brush, PharmD   Please be sure to bring in all your medications bottles to every appointment.    Thank you for choosing [facility]

## 2024-02-28 NOTE — Progress Notes (Signed)
 Patient ID: Judy Morrison, female   DOB: 1950-11-06, 73 y.o.   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 She was referred by Davied Estelle for genetic testing and follow-up on the ATM gene mutation.  She has a history of ATM gene mutation, which has implications for breast and pancreatic health. A recent MRI and MRCP of the pancreas was performed as part of her surveillance for pancreatic health, showing no abnormalities, including cysts, duct changes, or solid tumors.  She experiences heartburn, which is managed with omeprazole 40 mg once daily. The medication effectively alleviates her symptoms unless she consumes certain foods. No nausea, vomiting, changes in bowel habits, or blood in stool.  She experiences frequent choking episodes, particularly with liquids, occurring approximately once every two days. These episodes involve coughing and are not associated with solid foods. She has a history of a significant choking incident with a peanut, which required assistance from her daughter.  She has gained 35 pounds over the last two years, despite no significant changes in dietary intake. Her activity level has decreased following cancer treatment, which included surgery, chemotherapy, and radiation for breast cancer diagnosed at the end of 2023. She also experienced a severe episode of double pneumonia, which required hospitalization and has impacted her recovery and activity level.  Past Medical History:  Diagnosis Date   Anemia    Anxiety    Arthritis    Breast cancer (HCC) 2024   right breast IDC   Depressed    Dermatitis    rare form, on prednisone   Dyspnea    with exertion mostly   GERD (gastroesophageal reflux disease)     controlled with Omeprazole   Glaucoma    Hypothyroidism    Peripheral neuropathy due to chemotherapy [facility])    Personal history of chemotherapy    Personal history of radiation therapy    Port-A-Cath in place 11/15/2022   Torn rotator cuff    RIGHT    Urinary tract infection    dx 05-26-17 took 1 week cipro  BID completed 06-02-17.    Past Surgical History:  Procedure Laterality Date   ADENOIDECTOMY     APPENDECTOMY     arthroscopic right knee      BREAST BIOPSY Right 09/01/2022   MM RT BREAST BX W LOC DEV 1ST LESION IMAGE BX SPEC STEREO GUIDE 09/01/2022 [facility]   BREAST BIOPSY  10/01/2022   MM RT RADIOACTIVE SEED LOC MAMMO GUIDE 10/01/2022 [facility]   BREAST LUMPECTOMY     BREAST LUMPECTOMY WITH RADIOACTIVE SEED AND SENTINEL LYMPH NODE BIOPSY Right 10/05/2022   Procedure: RIGHT BREAST LUMPECTOMY WITH RADIOACTIVE SEED AND AXILLARY SENTINEL LYMPH NODE BIOPSY;  Surgeon: Enid Harry, MD;  Location: [facility];  Service: General;  Laterality: Right;   CHOLECYSTECTOMY     COLONOSCOPY  06/06/2017   Ladawn Boullion   Pelvic sling     x2   POLYPECTOMY     PORT A CATH REVISION N/A 11/22/2022   Procedure: PORT A CATH REVISION;  Surgeon: Enid Harry, MD;  Location: [facility];  Service: General;  Laterality: N/A;   PORT-A-CATH REMOVAL Left 11/28/2023   Procedure: REMOVAL PORT-A-CATH;  Surgeon: Enid Harry, MD;  Location: [facility];  Service: General;  Laterality: Left;   PORTACATH PLACEMENT  Left 10/05/2022   Procedure: INSERTION PORT-A-CATH;  Surgeon: Enid Harry, MD;  Location: [facility];  Service: General;  Laterality: Left;   ROTATOR CUFF REPAIR Right    TONSILLECTOMY     TOTAL KNEE ARTHROPLASTY Right 08/12/2017   Procedure: RIGHT TOTAL KNEE ARTHROPLASTY;  Surgeon: Genevie Kerns, MD;  Location: [facility];  Service: Orthopedics;  Laterality: Right;   TOTAL KNEE ARTHROPLASTY Left 01/06/2018   Procedure: LEFT  TOTAL KNEE ARTHROPLASTY;  Surgeon: Genevie Kerns, MD;  Location: [facility];  Service: Orthopedics;  Laterality: Left;   UPPER GASTROINTESTINAL ENDOSCOPY     WISDOM TOOTH EXTRACTION      Outpatient Medications Prior to Visit  Medication Sig Dispense Refill   Adapalene 0.3 % gel Apply 1 application daily as needed topically (for acne).     ALPRAZolam (XANAX) 0.25 MG tablet Take 0.25 mg by mouth daily as needed for anxiety.   1   amphetamine-dextroamphetamine (ADDERALL) 10 MG tablet Take 10 mg by mouth daily with breakfast.     anastrozole  (ARIMIDEX ) 1 MG tablet Take 1 tablet (1 mg total) by mouth daily. 90 tablet 3   brexpiprazole  (REXULTI ) 2 MG TABS tablet Take 2 mg by mouth daily after breakfast.     budesonide -formoterol  (SYMBICORT ) 80-4.5 MCG/ACT inhaler Inhale 2 puffs into the lungs in the morning and at bedtime. 1 each 12   carvedilol  (COREG ) 3.125 MG tablet TAKE 1 TABLET BY MOUTH 2 TIMES DAILY. 180 tablet 1   clobetasol cream (TEMOVATE) 0.05 % Apply 1 Application topically 2 (two) times daily as needed (irritation).     clonazePAM  (KLONOPIN ) 0.5 MG tablet Take 0.5 mg by mouth 2 (two) times daily.     clotrimazole (MYCELEX) 10 MG troche Take 10 mg by mouth as directed. 2 times per week     DULoxetine  (CYMBALTA ) 20 MG capsule Take 20 mg by mouth daily.     ferrous sulfate  325 (65 FE) MG tablet Take 325 mg by mouth every other day.     furosemide  (LASIX ) 20 MG tablet Take 1 tablet (20 mg total) by mouth as needed. 30 tablet 3   hydrOXYzine (VISTARIL) 25 MG capsule Take 25-50 mg by mouth daily.     latanoprost  (XALATAN ) 0.005 % ophthalmic solution Place 1 drop into both eyes at bedtime.     levothyroxine  (SYNTHROID , LEVOTHROID) 50 MCG tablet Take 50 mcg at bedtime by mouth.      loratadine  (CLARITIN ) 10 MG tablet Take 10 mg by mouth daily.     losartan  (COZAAR ) 25 MG tablet Take 1 tablet (25 mg total) by mouth at bedtime.     meloxicam (MOBIC) 7.5 MG tablet Take 7.5 mg by mouth daily.      nortriptyline  (PAMELOR ) 50 MG capsule Take 100 mg at bedtime by mouth.      OVER THE COUNTER MEDICATION 20 mLs by Mouth Rinse route 4 (four) times daily as needed (mouth pain). Oral-B Sore Mouth Rinse     phenazopyridine (PYRIDIUM) 200 MG tablet Take 200 mg by mouth 3 (three) times daily as needed.     simvastatin (ZOCOR) 10 MG tablet Take 10 mg by mouth daily.     tacrolimus (PROGRAF) 1 MG capsule Take 1 mg by mouth 2 (two) times daily. Per patient - Dissolve capsule in one half liter of H2O. Swish and spit twice daily     triamcinolone cream (KENALOG) 0.1 % Apply 1 Application topically 2 (two) times daily as needed (irritation).  valACYclovir (VALTREX) 1000 MG tablet Take 1,000 mg by mouth daily as needed (outbreaks).     omeprazole (PRILOSEC) 40 MG capsule Take 40 mg by mouth daily before breakfast.     Facility-Administered Medications Prior to Visit  Medication Dose Route Frequency Provider Last Rate Last Admin   sodium chloride  flush (NS) 0.9 % injection 10 mL  10 mL Intracatheter PRN Iruku, Praveena, MD        Allergies  Allergen Reactions   Penicillin G Anaphylaxis   Cephalexin Other (See Comments)    Pt does not remember reaction    Codeine Nausea Only   Erythromycin Itching    Family History  Problem Relation Age of Onset   Bowel Disease Mother        ishemic bowel   Lung cancer Mother        smoked   Cervical cancer Paternal Aunt    Cancer Maternal Grandfather        unknown type   Lung cancer Paternal Grandfather        smoked   Colon cancer Neg Hx    Breast cancer Neg Hx    Esophageal cancer Neg Hx    Pancreatic cancer Neg Hx    Prostate cancer Neg Hx    Rectal cancer Neg Hx    Stomach cancer Neg Hx    Allergic rhinitis Neg Hx    Asthma Neg Hx    Eczema Neg Hx    Urticaria Neg Hx    Colon polyps Neg Hx     Social History   Tobacco Use   Smoking status: Former    Current packs/day: 0.00    Types: Cigarettes    Quit date: 1986    Years since  quitting: 39.4   Smokeless tobacco: Never  Vaping Use   Vaping status: Never Used  Substance Use Topics   Alcohol use: Yes    Alcohol/week: 2.0 standard drinks of alcohol    Types: 2 Cans of beer per week    Comment: social   Drug use: No    ROS: As per history of present illness, otherwise negative  BP 116/76   Pulse 91   Ht 5' 3 (1.6 m)   Wt 170 lb (77.1 kg)   SpO2 96%   BMI 30.11 kg/m  Gen: awake, alert, NAD HEENT: anicteric  Abd: soft, NT/ND, +BS throughout Ext: no c/c/e Neuro: nonfocal   RELEVANT LABS AND IMAGING: CBC    Component Value Date/Time   WBC 6.2 11/01/2023 1022   WBC 10.1 10/01/2021 1314   RBC 4.39 11/01/2023 1022   HGB 11.0 (L) 11/01/2023 1022   HGB 13.7 09/15/2020 1209   HCT 33.5 (L) 11/01/2023 1022   HCT 41.3 09/15/2020 1209   PLT 362 11/01/2023 1022   PLT 349 09/15/2020 1209   MCV 76.3 (L) 11/01/2023 1022   MCV 86 09/15/2020 1209   MCH 25.1 (L) 11/01/2023 1022   MCHC 32.8 11/01/2023 1022   RDW 14.7 11/01/2023 1022   RDW 12.3 09/15/2020 1209   LYMPHSABS 0.8 11/01/2023 1022   LYMPHSABS 1.2 09/15/2020 1209   MONOABS 0.8 11/01/2023 1022   EOSABS 0.2 11/01/2023 1022   EOSABS 0.2 09/15/2020 1209   BASOSABS 0.1 11/01/2023 1022   BASOSABS 0.1 09/15/2020 1209    CMP     Component Value Date/Time   NA 136 01/27/2024 1206   NA 135 09/15/2020 1209   K 4.8 01/27/2024 1206   CL 101 01/27/2024 1206  CO2 26 01/27/2024 1206   GLUCOSE 94 01/27/2024 1206   BUN 10 01/27/2024 1206   BUN 14 09/15/2020 1209   CREATININE 0.84 01/27/2024 1206   CREATININE 0.92 11/01/2023 1022   CALCIUM 9.1 01/27/2024 1206   PROT 6.7 11/01/2023 1022   PROT 6.8 09/15/2020 1209   ALBUMIN 4.2 11/01/2023 1022   ALBUMIN 4.6 09/15/2020 1209   AST 15 11/01/2023 1022   ALT 13 11/01/2023 1022   ALKPHOS 118 11/01/2023 1022   BILITOT 0.4 11/01/2023 1022   GFRNONAA >60 01/27/2024 1206   GFRNONAA >60 11/01/2023 1022   GFRAA 74 09/15/2020 1209   MRI ABDOMEN WITHOUT AND  WITH CONTRAST (INCLUDING MRCP)   TECHNIQUE: Multiplanar multisequence MR imaging of the abdomen was performed both before and after the administration of intravenous contrast. Heavily T2-weighted images of the biliary and pancreatic ducts were obtained, and three-dimensional MRCP images were rendered by post processing.   CONTRAST:  7.5 mL Vueway    COMPARISON:  None Available.   FINDINGS: Lower chest: No acute findings.   Hepatobiliary: No hepatic masses identified. Prior cholecystectomy. No evidence of biliary obstruction.   Pancreas: Normal appearance. No evidence of pancreatic mass or ductal dilatation. No evidence of pancreatitis or peripancreatic fluid collections.   Spleen:  Within normal limits in size and appearance.   Adrenals/Urinary Tract: No suspicious masses identified. No evidence of hydronephrosis.   Stomach/Bowel: Unremarkable.   Vascular/Lymphatic: No pathologically enlarged lymph nodes identified. No acute vascular findings.   Other:  None.   Musculoskeletal:  No suspicious bone lesions identified.   IMPRESSION: Negative. No evidence of pancreatic mass or other significant abnormality.     Electronically Signed   By: Marlyce Sine M.D.   On: 02/24/2024 11:11   ASSESSMENT/PLAN: 73 year old female with history of breast cancer, now known ATM gene mutation, adenomatous colon polyps including those greater than a centimeter, GERD, hypothyroidism who presents for follow-up regarding pancreatic imaging and genetic testing implications.   Pancreatic surveillance due to ATM gene mutation ATM gene mutation with implications for breast and pancreatic cancer. Recent MRI of the pancreas was normal. Discussed alternating MRI with endoscopic ultrasound (EUS) for pancreatic surveillance as per guidelines. EUS recommended annually. - Plan for EUS next summer for pancreatic surveillance with Dr. Brice Campi; recall placed in our system. - Continue annual pancreatic  imaging as per guidelines; initial plan is EUS every other year alternating with MRI/MRCP every other year  Gastroesophageal reflux disease (GERD) GERD managed with omeprazole 40 mg once daily. Symptoms well-controlled unless dietary indiscretions occur. She prefers omeprazole prescriptions managed by this office. - Prescribe omeprazole 40 mg once daily with 90-day supply and 1-year refills.  Oropharyngeal dysphagia Intermittent choking episodes with liquids suggestive of oropharyngeal dysphagia. Likely related to oropharyngeal muscle or epiglottis dysfunction. Discussed potential referral to speech and swallow therapist for further evaluation and therapy if symptoms persist or worsen. - Recommend drinking through a straw to streamline fluid intake. - Discuss potential referral to speech and swallow therapist if symptoms persist or worsen.  Breast cancer Breast cancer with lumpectomy in January 2023, followed by chemotherapy and radiation. Currently in remission as of February 2025. - Following closely with oncology  Pneumonia Severe bacterial pneumonia acquired in [place], requiring hospitalization and IV antibiotics. Recovering slowly with gradual improvement in activity level.  Weight gain 35-pound weight gain over the past two years. No significant dietary changes reported. Activity level decreased post-cancer treatment and pneumonia. Thyroid  function normal. Considering hormonal and metabolic factors post-cancer  treatment. - Encourage gradual increase in physical activity focusing on duration rather than intensity. - Monitor weight and dietary intake.  History of colonic polyps - Surveillance colonoscopy recommended February 2027   ON:[id number], [facility] [address] Anise Barlow [place],  [place] [address]

## 2024-03-01 ENCOUNTER — Other Ambulatory Visit: Payer: Self-pay

## 2024-03-01 ENCOUNTER — Encounter: Payer: Self-pay | Admitting: Physical Therapy

## 2024-03-01 ENCOUNTER — Ambulatory Visit: Attending: Otolaryngology | Admitting: Physical Therapy

## 2024-03-01 DIAGNOSIS — R2689 Other abnormalities of gait and mobility: Secondary | ICD-10-CM | POA: Insufficient documentation

## 2024-03-01 DIAGNOSIS — R2681 Unsteadiness on feet: Secondary | ICD-10-CM | POA: Insufficient documentation

## 2024-03-01 DIAGNOSIS — R42 Dizziness and giddiness: Secondary | ICD-10-CM | POA: Diagnosis present

## 2024-03-01 NOTE — Therapy (Signed)
 OUTPATIENT PHYSICAL THERAPY VESTIBULAR EVALUATION     Patient Name: Judy Morrison MRN: [id number] DOB:[birth year], 73 y.o., female Today's Date: 03/01/2024  END OF SESSION:  PT End of Session - 03/01/24 1158     Visit Number 1    Number of Visits 17    Date for PT Re-Evaluation 04/27/24    Authorization Type Medicare/[employer]    PT Start Time 1155    PT Stop Time 1235    PT Time Calculation (min) 40 min    Activity Tolerance Patient tolerated treatment well    Behavior During Therapy WFL for tasks assessed/performed          Past Medical History:  Diagnosis Date   Anemia    Anxiety    Arthritis    Breast cancer (HCC) 2024   right breast IDC   Depressed    Dermatitis    rare form, on prednisone   Dyspnea    with exertion mostly   GERD (gastroesophageal reflux disease)    controlled with Omeprazole   Glaucoma    Hypothyroidism    Peripheral neuropathy due to chemotherapy [facility])    Personal history of chemotherapy    Personal history of radiation therapy    Port-A-Cath in place 11/15/2022   Torn rotator cuff    RIGHT    Urinary tract infection    dx 05-26-17 took 1 week cipro  BID completed 06-02-17.   Past Surgical History:  Procedure Laterality Date   ADENOIDECTOMY     APPENDECTOMY     arthroscopic right knee      BREAST BIOPSY Right 09/01/2022   MM RT BREAST BX W LOC DEV 1ST LESION IMAGE BX SPEC STEREO GUIDE 09/01/2022 [facility]   BREAST BIOPSY  10/01/2022   MM RT RADIOACTIVE SEED LOC MAMMO GUIDE 10/01/2022 [facility]   BREAST LUMPECTOMY     BREAST LUMPECTOMY WITH RADIOACTIVE SEED AND SENTINEL LYMPH NODE BIOPSY Right 10/05/2022   Procedure: RIGHT BREAST LUMPECTOMY WITH RADIOACTIVE SEED AND AXILLARY SENTINEL LYMPH NODE BIOPSY;  Surgeon: Ebbie Cough, MD;  Location: [facility];  Service: General;  Laterality: Right;   CHOLECYSTECTOMY     COLONOSCOPY  06/06/2017   Pyrtle   Pelvic sling     x2   POLYPECTOMY      PORT A CATH REVISION N/A 11/22/2022   Procedure: PORT A CATH REVISION;  Surgeon: Ebbie Cough, MD;  Location: [facility];  Service: General;  Laterality: N/A;   PORT-A-CATH REMOVAL Left 11/28/2023   Procedure: REMOVAL PORT-A-CATH;  Surgeon: Ebbie Cough, MD;  Location: [facility];  Service: General;  Laterality: Left;   PORTACATH PLACEMENT Left 10/05/2022   Procedure: INSERTION PORT-A-CATH;  Surgeon: Ebbie Cough, MD;  Location: [facility];  Service: General;  Laterality: Left;   ROTATOR CUFF REPAIR Right    TONSILLECTOMY     TOTAL KNEE ARTHROPLASTY Right 08/12/2017   Procedure: RIGHT TOTAL KNEE ARTHROPLASTY;  Surgeon: Gerome Charleston, MD;  Location: [facility];  Service: Orthopedics;  Laterality: Right;   TOTAL KNEE ARTHROPLASTY Left 01/06/2018   Procedure: LEFT TOTAL KNEE ARTHROPLASTY;  Surgeon: Gerome Charleston, MD;  Location: [facility];  Service: Orthopedics;  Laterality: Left;   UPPER GASTROINTESTINAL ENDOSCOPY     WISDOM TOOTH EXTRACTION     Patient Active Problem List   Diagnosis Date Noted   Allergic rhinitis 01/18/2024   Dyspnea on exertion 01/12/2024   Suspected sleep apnea 01/12/2024   Sensorineural hearing loss, bilateral 10/11/2023  Cognitive changes 05/02/2023   Genetic testing 10/04/2022   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   Malignant neoplasm of upper-outer quadrant of right breast in female, estrogen receptor positive (HCC) 09/10/2022   Rash and other nonspecific skin eruption 09/15/2020   Allergic contact dermatitis 09/15/2020   Pruritus 08/14/2020   Dizziness 05/10/2018   Atypical chest pain 05/10/2018   Primary osteoarthritis of left knee 01/06/2018   S/P knee replacement 08/12/2017   Incomplete tear of right rotator cuff 05/19/2017   Osteopenia 05/19/2017   Urge incontinence of urine 05/19/2017   Primary open angle glaucoma (POAG) 12/30/2016   Primary osteoarthritis of both knees 12/30/2016   Recurrent oral  herpes simplex infection 12/30/2016   Tubular adenoma of colon 12/30/2016   Hypothyroidism 06/25/2016   Right foot pain 12/19/2014   Anxiety 05/21/2011   Seasonal allergies 05/21/2011    PCP: Medicine, [facility] REFERRING PROVIDER: Karis Clunes, MD   REFERRING DIAG: R42 (ICD-10-CM) - Dizziness   THERAPY DIAG:  Dizziness and giddiness  Unsteadiness on feet  Other abnormalities of gait and mobility  ONSET DATE: 02/15/2024  Rationale for Evaluation and Treatment: Rehabilitation  SUBJECTIVE:   SUBJECTIVE STATEMENT: Was hospitalized for 5 days in [place] due to double pneumonia.  It's just been very slow to recover.  I do get dizzy a bit and out of balance.   Planning a trip to [place]  in July and [place], [place]  later this year.  My children are noticing more changes in cognitive function. Pt accompanied by: self  PERTINENT HISTORY: breast cancer,hospitalization for bilateral pulmonary infiltrates with dyspnea, CHF, other PMH above  PAIN:  Are you having pain? No  PRECAUTIONS: Fall  RED FLAGS: None   WEIGHT BEARING RESTRICTIONS: No  FALLS: Has patient fallen in last 6 months? No  LIVING ENVIRONMENT: Lives with: lives alone Lives in: Other 3 story condo-stairs/elevator to 2nd floor Stairs: and elevator Has following equipment at home: Single point cane and Environmental consultant - 4 wheeled  PLOF: Independent, Independent with household mobility without device, Independent with community mobility with device, and Independent with community mobility without device  PATIENT GOALS: To work on the balance  OBJECTIVE:  Note: Objective measures were completed at Evaluation unless otherwise noted.  DIAGNOSTIC FINDINGS: NA for this episode  COGNITION: Overall cognitive status: Within functional limits for tasks assessed and Reports changes in cognitive function   SENSATION: Not tested  POSTURE:  rounded shoulders and L pelvis/trunk posteriorly  rotated  LOWER EXTREMITY MMT: NT at eval  MMT Right eval Left eval  Hip flexion    Hip abduction    Hip adduction    Hip internal rotation    Hip external rotation    Knee flexion    Knee extension    Ankle dorsiflexion    Ankle plantarflexion    Ankle inversion    Ankle eversion    (Blank rows = not tested)  TRANSFERS: Assistive device utilized: None  Sit to stand: Modified independence Stand to sit: Modified independence  GAIT: Gait pattern: LLE tends to cross midline, decreased arm swing, step through pattern, decreased step length- Right, decreased step length- Left, scissoring, and narrow BOS Distance walked: 50 Assistive device utilized: None Level of assistance: SBA Comments: Slowed, guarded gait pattern without device  PATIENT SURVEYS:  DHI 20  VESTIBULAR ASSESSMENT:  GENERAL OBSERVATION: No acute distress, but pt does seem SOB.  Vitals:  85%>92-93%, HR 80 bpm   SYMPTOM BEHAVIOR:  Subjective history: Reports  dizziness is off balance  Non-Vestibular symptoms: changes in hearing and balance  Type of dizziness: Imbalance (Disequilibrium) and Unsteady with head/body turns  Frequency: daily  Duration: varies  Aggravating factors: Induced by motion: occur when walking, looking up at the ceiling, bending down to the ground, turning body quickly, and turning head quickly  Relieving factors: slow movements  Progression of symptoms: unchanged  OCULOMOTOR EXAM:  Ocular Alignment: normal  Ocular ROM: No Limitations  Spontaneous Nystagmus: absent  Gaze-Induced Nystagmus: absent  Smooth Pursuits: intact  Saccades: intact  Convergence/Divergence: 6 cm    VESTIBULAR - OCULAR REFLEX:   Slow VOR: Comment: unsettled feeling with horizontal 5/10; same unsettled/off balance feeling 4/10   VOR Cancellation: Normal  Head-Impulse Test: HIT Right: negative HIT Left: positive  Dynamic Visual Acuity: NT     MOTION SENSITIVITY:  Motion Sensitivity  Quotient Intensity: 0 = none, 1 = Lightheaded, 2 = Mild, 3 = Moderate, 4 = Severe, 5 = Vomiting  Intensity  1. Sitting to supine   2. Supine to L side   3. Supine to R side   4. Supine to sitting   5. L Hallpike-Dix   6. Up from L    7. R Hallpike-Dix   8. Up from R    9. Sitting, head tipped to L knee   10. Head up from L knee   11. Sitting, head tipped to R knee   12. Head up from R knee   13. Sitting head turns x5   14.Sitting head nods x5   15. In stance, 180 turn to L    16. In stance, 180 turn to R     OTHOSTATICS: not done  FUNCTIONAL GAIT: 10 meter walk test: 16.68 sec = 1.95 ft/sec   M-CTSIB  Condition 1: Firm Surface, EO 30 Sec, Mild Sway  Condition 2: Firm Surface, EC 19.97 sec Sec, Severe and unable to maintain Sway  Condition 3: Foam Surface, EO 30 Sec, Mild Sway  Condition 4: Foam Surface, EC 3.34 Sec, Severe Sway  O2 sats at end of this test:  87%, HR 102 bpm                                                                                                                           TREATMENT DATE: 03/01/2024   PATIENT EDUCATION: Education details: Eval results, POC, rationale for vestibular rehab to assist with balance; monitor O2 sats and ask MD about this if it still is running <90% Person educated: Patient Education method: Explanation Education comprehension: verbalized understanding  HOME EXERCISE PROGRAM: Not yet initiated  GOALS: Goals reviewed with patient? Yes  SHORT TERM GOALS: Target date: 03/30/2024  Pt will be independent with HEP for improved balance, gait. Baseline: Goal status: INITIAL  LONG TERM GOALS: Target date: 04/27/2024  Pt will be independent with HEP for improved balance, dizziness, gait. Baseline:  Goal status: INITIAL  2.  FGA to be assessed and score to  improve to at least 22/30 for decreased fall risk. Baseline: TBA Goal status: INITIAL  3.  Gait velocity to improve to at least 2.3 ft/sec for improved gait  efficiency and safety. Baseline: 1.7 ft/sec Goal status: INITIAL  4.  MCSTIB Condition 2 and 4 to improve to 30 sec with mod or better sway for improved balance. Baseline:  Goal status: INITIAL  5.  Pt will verbalize understanding of fall prevention in home environment.  Baseline:  Goal status: INITIAL  6.  DHI to improve to less than or equal to 10, for improved balance, decreased dizziness. Baseline: 20 Goal status: INITIAL  ASSESSMENT:  CLINICAL IMPRESSION: Patient is a 73 y.o. female who was seen today for physical therapy evaluation and treatment for chronic dizziness.  She reports that balance is her main complaint, and the dizziness is described as off-balance.  She presents today with decreased balance, unsteady gait.  Oculomotor testing is WFL, except convergence distance is noted approximately 6 cm.  Pt has increased dizziness/off-balance symptoms with horizontal and vertical VOR.  She has decreased vestibular system use for balance, noted on Conditions 2 and 4 of MCTSIB test; she has decreased gait velocity of 1.95 ft/sec, which is classified as limited community ambulator.  She would benefit from skilled PT to address the above stated deficits to decrease fall risk and improve overall balance.  OBJECTIVE IMPAIRMENTS: Abnormal gait, decreased balance, decreased mobility, difficulty walking, dizziness, and postural dysfunction.   ACTIVITY LIMITATIONS: standing, transfers, and locomotion level  PARTICIPATION LIMITATIONS: shopping, community activity, and travel  PERSONAL FACTORS: 3+ comorbidities: see above are also affecting patient's functional outcome.   REHAB POTENTIAL: Good  CLINICAL DECISION MAKING: Evolving/moderate complexity  EVALUATION COMPLEXITY: Moderate   PLAN:  PT FREQUENCY: 2x/week  PT DURATION: 8 weeks  PLANNED INTERVENTIONS: 97750- Physical Performance Testing, 97110-Therapeutic exercises, 97530- Therapeutic activity, 97112- Neuromuscular  re-education, 97535- Self Care, [id number]- Manual therapy, [phone]- Gait training, Patient/Family education, Balance training, and Vestibular training  PLAN FOR NEXT SESSION: MMT, perform TUG, FGA, FTSTS, initiate HEP   Virlan Kempker W., PT 03/01/2024, 12:45 PM   [facility] [address], [address] [place], [place] [address] Phone # [phone] Fax # [phone]

## 2024-03-05 ENCOUNTER — Ambulatory Visit

## 2024-03-05 DIAGNOSIS — R2689 Other abnormalities of gait and mobility: Secondary | ICD-10-CM

## 2024-03-05 DIAGNOSIS — R42 Dizziness and giddiness: Secondary | ICD-10-CM | POA: Diagnosis not present

## 2024-03-05 DIAGNOSIS — R2681 Unsteadiness on feet: Secondary | ICD-10-CM

## 2024-03-05 NOTE — Therapy (Signed)
 OUTPATIENT PHYSICAL THERAPY VESTIBULAR TREATMENT     Patient Name: Judy Morrison MRN: [id number] DOB:May 10, 1951, 73 y.o., female Today's Date: 03/05/2024  END OF SESSION:  PT End of Session - 03/05/24 1312     Visit Number 2    Number of Visits 17    Date for PT Re-Evaluation 04/27/24    Authorization Type Medicare/[employer]    PT Start Time 1315    PT Stop Time 1400    PT Time Calculation (min) 45 min    Activity Tolerance Patient tolerated treatment well    Behavior During Therapy WFL for tasks assessed/performed          Past Medical History:  Diagnosis Date   Anemia    Anxiety    Arthritis    Breast cancer (HCC) 2024   right breast IDC   Depressed    Dermatitis    rare form, on prednisone   Dyspnea    with exertion mostly   GERD (gastroesophageal reflux disease)    controlled with Omeprazole   Glaucoma    Hypothyroidism    Peripheral neuropathy due to chemotherapy [facility])    Personal history of chemotherapy    Personal history of radiation therapy    Port-A-Cath in place 11/15/2022   Torn rotator cuff    RIGHT    Urinary tract infection    dx 05-26-17 took 1 week cipro  BID completed 06-02-17.   Past Surgical History:  Procedure Laterality Date   ADENOIDECTOMY     APPENDECTOMY     arthroscopic right knee      BREAST BIOPSY Right 09/01/2022   MM RT BREAST BX W LOC DEV 1ST LESION IMAGE BX SPEC STEREO GUIDE 09/01/2022 [facility]   BREAST BIOPSY  10/01/2022   MM RT RADIOACTIVE SEED LOC MAMMO GUIDE 10/01/2022 [facility]   BREAST LUMPECTOMY     BREAST LUMPECTOMY WITH RADIOACTIVE SEED AND SENTINEL LYMPH NODE BIOPSY Right 10/05/2022   Procedure: RIGHT BREAST LUMPECTOMY WITH RADIOACTIVE SEED AND AXILLARY SENTINEL LYMPH NODE BIOPSY;  Surgeon: Ebbie Cough, MD;  Location: [facility];  Service: General;  Laterality: Right;   CHOLECYSTECTOMY     COLONOSCOPY  06/06/2017   Pyrtle   Pelvic sling     x2   POLYPECTOMY      PORT A CATH REVISION N/A 11/22/2022   Procedure: PORT A CATH REVISION;  Surgeon: Ebbie Cough, MD;  Location: [facility] OR;  Service: General;  Laterality: N/A;   PORT-A-CATH REMOVAL Left 11/28/2023   Procedure: REMOVAL PORT-A-CATH;  Surgeon: Ebbie Cough, MD;  Location: [facility];  Service: General;  Laterality: Left;   PORTACATH PLACEMENT Left 10/05/2022   Procedure: INSERTION PORT-A-CATH;  Surgeon: Ebbie Cough, MD;  Location: [facility];  Service: General;  Laterality: Left;   ROTATOR CUFF REPAIR Right    TONSILLECTOMY     TOTAL KNEE ARTHROPLASTY Right 08/12/2017   Procedure: RIGHT TOTAL KNEE ARTHROPLASTY;  Surgeon: Gerome Charleston, MD;  Location: [facility];  Service: Orthopedics;  Laterality: Right;   TOTAL KNEE ARTHROPLASTY Left 01/06/2018   Procedure: LEFT TOTAL KNEE ARTHROPLASTY;  Surgeon: Gerome Charleston, MD;  Location: [facility];  Service: Orthopedics;  Laterality: Left;   UPPER GASTROINTESTINAL ENDOSCOPY     WISDOM TOOTH EXTRACTION     Patient Active Problem List   Diagnosis Date Noted   Allergic rhinitis 01/18/2024   Dyspnea on exertion 01/12/2024   Suspected sleep apnea 01/12/2024   Sensorineural hearing loss, bilateral 10/11/2023  Cognitive changes 05/02/2023   Genetic testing 10/04/2022   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   Malignant neoplasm of upper-outer quadrant of right breast in female, estrogen receptor positive (HCC) 09/10/2022   Rash and other nonspecific skin eruption 09/15/2020   Allergic contact dermatitis 09/15/2020   Pruritus 08/14/2020   Dizziness 05/10/2018   Atypical chest pain 05/10/2018   Primary osteoarthritis of left knee 01/06/2018   S/P knee replacement 08/12/2017   Incomplete tear of right rotator cuff 05/19/2017   Osteopenia 05/19/2017   Urge incontinence of urine 05/19/2017   Primary open angle glaucoma (POAG) 12/30/2016   Primary osteoarthritis of both knees 12/30/2016   Recurrent oral  herpes simplex infection 12/30/2016   Tubular adenoma of colon 12/30/2016   Hypothyroidism 06/25/2016   Right foot pain 12/19/2014   Anxiety 05/21/2011   Seasonal allergies 05/21/2011    PCP: Medicine, [facility] REFERRING PROVIDER: Karis Clunes, MD   REFERRING DIAG: R42 (ICD-10-CM) - Dizziness   THERAPY DIAG:  Dizziness and giddiness  Unsteadiness on feet  Other abnormalities of gait and mobility  ONSET DATE: 02/15/2024  Rationale for Evaluation and Treatment: Rehabilitation  SUBJECTIVE:   SUBJECTIVE STATEMENT: Doing ok, still recovering from this last illness/setback.  Have been walking most days 30-60 minutes and monitoring HR during without any adverse issues.  Pt accompanied by: self  PERTINENT HISTORY: breast cancer,hospitalization for bilateral pulmonary infiltrates with dyspnea, CHF, other PMH above  PAIN:  Are you having pain? No  PRECAUTIONS: Fall  RED FLAGS: None   WEIGHT BEARING RESTRICTIONS: No  FALLS: Has patient fallen in last 6 months? No  LIVING ENVIRONMENT: Lives with: lives alone Lives in: Other 3 story condo-stairs/elevator to 2nd floor Stairs: and elevator Has following equipment at home: Single point cane and Environmental consultant - 4 wheeled  PLOF: Independent, Independent with household mobility without device, Independent with community mobility with device, and Independent with community mobility without device  PATIENT GOALS: To work on the balance  OBJECTIVE:   TODAY'S TREATMENT: 03/05/24 Activity Comments  5xSTS, TUG   FGA 23/30  Manual muscle test See chart  Bridge w/ ankle PF/DF 2x10 Green loop around knees  Sidelying clamshell 2x10 Green loop knees       PATIENT EDUCATION: Education details: Eval results, POC, rationale for vestibular rehab to assist with balance; monitor O2 sats and ask MD about this if it still is running <90% Person educated: Patient Education method: Explanation Education comprehension: verbalized  understanding  HOME EXERCISE PROGRAM: Access Code: [email or web address] URL: [email or web address] Date: 03/05/2024 Prepared by: Burnard Sandifer  Exercises - Supine Bridge with Resistance Band  - 2-3 x weekly - 1-2 sets - 10 reps - Bridge with Ankle Dorsiflexion  - 2-3 x weekly - 1-3 sets - 10 reps - Clamshell with Resistance  - 2-3 x weekly - 2-3 sets - 10 reps   [facility] PT Assessment - 03/05/24 0001       Standardized Balance Assessment   Standardized Balance Assessment Timed Up and Go Test;Five Times Sit to Stand    Five times sit to stand comments  12      Timed Up and Go Test   Normal TUG (seconds) 11      Functional Gait  Assessment   Gait assessed  Yes    Gait Level Surface Walks 20 ft in less than 7 sec but greater than 5.5 sec, uses assistive device, slower speed, mild gait deviations, or deviates 6-10 in outside of the 12  in walkway width.    Change in Gait Speed Able to smoothly change walking speed without loss of balance or gait deviation. Deviate no more than 6 in outside of the 12 in walkway width.    Gait with Horizontal Head Turns Performs head turns smoothly with slight change in gait velocity (eg, minor disruption to smooth gait path), deviates 6-10 in outside 12 in walkway width, or uses an assistive device.    Gait with Vertical Head Turns Performs head turns with no change in gait. Deviates no more than 6 in outside 12 in walkway width.    Gait and Pivot Turn Pivot turns safely within 3 sec and stops quickly with no loss of balance.    Step Over Obstacle Is able to step over one shoe box (4.5 in total height) without changing gait speed. No evidence of imbalance.    Gait with Narrow Base of Support Ambulates 7-9 steps.    Gait with Eyes Closed Walks 20 ft, slow speed, abnormal gait pattern, evidence for imbalance, deviates 10-15 in outside 12 in walkway width. Requires more than 9 sec to ambulate 20 ft.    Ambulating Backwards Walks 20 ft, uses assistive device,  slower speed, mild gait deviations, deviates 6-10 in outside 12 in walkway width.    Steps Alternating feet, no rail.    Total Score 23           Note: Objective measures were completed at Evaluation unless otherwise noted.  DIAGNOSTIC FINDINGS: NA for this episode  COGNITION: Overall cognitive status: Within functional limits for tasks assessed and Reports changes in cognitive function   SENSATION: Not tested  POSTURE:  rounded shoulders and L pelvis/trunk posteriorly rotated  LOWER EXTREMITY MMT: NT at eval  MMT Right eval Left eval  Hip flexion    Hip abduction 3- 3-  Hip adduction    Hip extension 3+ 3+  Hip internal rotation 4- 4-  Hip external rotation 3 3  Knee flexion 4 4  Knee extension 4+ 4+  Ankle dorsiflexion    Ankle plantarflexion    Ankle inversion    Ankle eversion    (Blank rows = not tested)  TRANSFERS: Assistive device utilized: None  Sit to stand: Modified independence Stand to sit: Modified independence  GAIT: Gait pattern: LLE tends to cross midline, decreased arm swing, step through pattern, decreased step length- Right, decreased step length- Left, scissoring, and narrow BOS Distance walked: 50 Assistive device utilized: None Level of assistance: SBA Comments: Slowed, guarded gait pattern without device  PATIENT SURVEYS:  DHI 20  VESTIBULAR ASSESSMENT:  GENERAL OBSERVATION: No acute distress, but pt does seem SOB.  Vitals:  85%>92-93%, HR 80 bpm   SYMPTOM BEHAVIOR:  Subjective history: Reports dizziness is off balance  Non-Vestibular symptoms: changes in hearing and balance  Type of dizziness: Imbalance (Disequilibrium) and Unsteady with head/body turns  Frequency: daily  Duration: varies  Aggravating factors: Induced by motion: occur when walking, looking up at the ceiling, bending down to the ground, turning body quickly, and turning head quickly  Relieving factors: slow movements  Progression of symptoms:  unchanged  OCULOMOTOR EXAM:  Ocular Alignment: normal  Ocular ROM: No Limitations  Spontaneous Nystagmus: absent  Gaze-Induced Nystagmus: absent  Smooth Pursuits: intact  Saccades: intact  Convergence/Divergence: 6 cm    VESTIBULAR - OCULAR REFLEX:   Slow VOR: Comment: unsettled feeling with horizontal 5/10; same unsettled/off balance feeling 4/10   VOR Cancellation: Normal  Head-Impulse Test: HIT Right:  negative HIT Left: positive  Dynamic Visual Acuity: NT     MOTION SENSITIVITY:  Motion Sensitivity Quotient Intensity: 0 = none, 1 = Lightheaded, 2 = Mild, 3 = Moderate, 4 = Severe, 5 = Vomiting  Intensity  1. Sitting to supine   2. Supine to L side   3. Supine to R side   4. Supine to sitting   5. L Hallpike-Dix   6. Up from L    7. R Hallpike-Dix   8. Up from R    9. Sitting, head tipped to L knee   10. Head up from L knee   11. Sitting, head tipped to R knee   12. Head up from R knee   13. Sitting head turns x5   14.Sitting head nods x5   15. In stance, 180 turn to L    16. In stance, 180 turn to R     OTHOSTATICS: not done  FUNCTIONAL GAIT: 10 meter walk test: 16.68 sec = 1.95 ft/sec   M-CTSIB  Condition 1: Firm Surface, EO 30 Sec, Mild Sway  Condition 2: Firm Surface, EC 19.97 sec Sec, Severe and unable to maintain Sway  Condition 3: Foam Surface, EO 30 Sec, Mild Sway  Condition 4: Foam Surface, EC 3.34 Sec, Severe Sway  O2 sats at end of this test:  87%, HR 102 bpm                                                                                                                           TREATMENT DATE: 03/01/2024     GOALS: Goals reviewed with patient? Yes  SHORT TERM GOALS: Target date: 03/30/2024  Pt will be independent with HEP for improved balance, gait. Baseline: Goal status: INITIAL  LONG TERM GOALS: Target date: 04/27/2024  Pt will be independent with HEP for improved balance, dizziness, gait. Baseline:  Goal status: INITIAL  2.   FGA to be assessed and score to improve to at least 25/30 for decreased fall risk. Baseline: 23/30 Goal status: REVISED  3.  Gait velocity to improve to at least 2.3 ft/sec for improved gait efficiency and safety. Baseline: 1.7 ft/sec Goal status: INITIAL  4.  MCSTIB Condition 2 and 4 to improve to 30 sec with mod or better sway for improved balance. Baseline:  Goal status: INITIAL  5.  Pt will verbalize understanding of fall prevention in home environment.  Baseline:  Goal status: INITIAL  6.  DHI to improve to less than or equal to 10, for improved balance, decreased dizziness. Baseline: 20 Goal status: INITIAL  ASSESSMENT:  CLINICAL IMPRESSION: Continued with testing for balance/fall risk. Demo low risk for falls per time 5xSTS test and TUG test. Functional Gait Assessment revealing for increased risk for falls and greatest difficulty with eyes closed scenarios and narrow BOS/single limb support. Ambulates with Trendelenberg RLE > LLE without AD level surfaces.  Manual muscle test reveals weakness to bilateral hip abduction and hip ER/IR  which may be contributing to gait and single limb stance deficits.  Instructed in initial HEP with emphasis on improved hip abduction and external rotation recruitment for improved limb stability. Continued sessions to advance POC details to improve mobility and reduce risk for falls.   OBJECTIVE IMPAIRMENTS: Abnormal gait, decreased balance, decreased mobility, difficulty walking, dizziness, and postural dysfunction.   ACTIVITY LIMITATIONS: standing, transfers, and locomotion level  PARTICIPATION LIMITATIONS: shopping, community activity, and travel  PERSONAL FACTORS: 3+ comorbidities: see above are also affecting patient's functional outcome.   REHAB POTENTIAL: Good  CLINICAL DECISION MAKING: Evolving/moderate complexity  EVALUATION COMPLEXITY: Moderate   PLAN:  PT FREQUENCY: 2x/week  PT DURATION: 8 weeks  PLANNED INTERVENTIONS:  97750- Physical Performance Testing, 97110-Therapeutic exercises, 97530- Therapeutic activity, 97112- Neuromuscular re-education, 97535- Self Care, 02859- Manual therapy, [phone]- Gait training, Patient/Family education, Balance training, and Vestibular training  PLAN FOR NEXT SESSION: Review and progress HEP   Jonette MARLA Sandifer, PT 03/05/2024, 1:12 PM   [facility] at [facility] [address], [address] [place], [place] [address] Phone # [phone] Fax # [phone]

## 2024-03-11 NOTE — Progress Notes (Signed)
 Assessment/Plan:     Judy Morrison is a very pleasant 73 y.o. year old RH female with a history of hypertension, hyperlipidemia, hypothyroidism, glaucoma, BPPV,anxiety, depression, right breast cancer status post chemotherapy, seen today for evaluation of memory loss. MoCA today is .  Workup is in progress.    Memory Impairment of unclear etiology, concern for ***  MRI brain without contrast to assess for underlying structural abnormality and assess vascular load  Neurocognitive testing to further evaluate cognitive concerns and determine other underlying cause of memory changes, including potential contribution from sleep, anxiety, attention, or depression among others  Check B12, TSH Recommend good control of cardiovascular risk factors.   Continue to control mood as per PCP Folllow up in ***months   Subjective:    The patient is accompanied by ***  who supplements  the history.    How long did patient have memory difficulties?  For about.  Patient reports some difficulty remembering new information, recent conversations, names. repeats oneself?  Endorsed Disoriented when walking into a room? Denies ***  Leaving objects in unusual places?  Denies.   Wandering behavior? Denies.   Any personality changes, or depression, anxiety? Denies *** Hallucinations or paranoia? Denies.   Seizures? Denies.    Any sleep changes?  Sleeps well *** Does not sleep well. **  frequent nightmares or dream reenactment, other REM behavior or sleepwalking   Sleep apnea? Denies.   Any hygiene concerns?  Denies.   Independent of bathing and dressing? Endorsed  Does the patient need help with medications?  is in charge *** Who is in charge of the finances?  is in charge   *** Any changes in appetite?   Denies.  He has a new habit to ruminating when chewing***   Patient have trouble swallowing?  Denies.   Does the patient cook? No*** yes, denies forgetting common recipes or kitchen accidents  *** Any headaches?  Denies.   Chronic pain? Denies.   Ambulates with difficulty?  He has a documented ataxia dating back to 2022***  Needs a cane*** Needs a walker *** to ambulate for stability.   Recent falls or head injuries? Denies.     Vision changes?  Denies any new issues.  Has a history of*** Any strokelike symptoms? Denies.   Any tremors? Denies. *** Any anosmia? Denies.   Any incontinence of urine? Denies.   Any bowel dysfunction? Denies.      Patient lives with ***  History of heavy alcohol intake? Denies.   History of heavy tobacco use? Denies.   Family history of dementia?   *** with dementia  Does patient drive? No longer drives  *** yes, denies getting lost.***  Masters degree, retired from [employer]  Allergies  Allergen Reactions   Penicillin G Anaphylaxis   Cephalexin Other (See Comments)    Pt does not remember reaction    Codeine Nausea Only   Erythromycin Itching    Current Outpatient Medications  Medication Instructions   Adapalene 0.3 % gel 1 application , Daily PRN   ALPRAZolam (XANAX) 0.25 mg, Daily PRN   amphetamine-dextroamphetamine (ADDERALL) 10 MG tablet 10 mg, Daily with breakfast   anastrozole  (ARIMIDEX ) 1 mg, Oral, Daily   brexpiprazole  (REXULTI ) 2 mg, Daily after breakfast   budesonide -formoterol  (SYMBICORT ) 80-4.5 MCG/ACT inhaler 2 puffs, Inhalation, 2 times daily   carvedilol  (COREG ) 3.125 mg, Oral, 2 times daily   clobetasol cream (TEMOVATE) 0.05 % 1 Application, 2 times daily PRN  clonazePAM  (KLONOPIN ) 0.5 mg, 2 times daily   clotrimazole (MYCELEX) 10 mg, As directed   DULoxetine  (CYMBALTA ) 20 mg, Daily   ferrous sulfate  325 mg, Every other day   furosemide  (LASIX ) 20 mg, Oral, As needed   hydrOXYzine (VISTARIL) 25-50 mg, Daily   latanoprost  (XALATAN ) 0.005 % ophthalmic solution 1 drop, Daily at bedtime   levothyroxine  (SYNTHROID ) 50 mcg, Daily at bedtime   loratadine  (CLARITIN ) 10 mg, Daily   losartan  (COZAAR ) 25 mg,  Oral, Nightly   meloxicam (MOBIC) 7.5 mg, Daily   nortriptyline  (PAMELOR ) 100 mg, Daily at bedtime   omeprazole  (PRILOSEC) 40 mg, Oral, Daily before breakfast   OVER THE COUNTER MEDICATION 20 mLs, 4 times daily PRN   phenazopyridine (PYRIDIUM) 200 mg, 3 times daily PRN   simvastatin (ZOCOR) 10 mg, Daily   tacrolimus (PROGRAF) 1 mg, 2 times daily   triamcinolone cream (KENALOG) 0.1 % 1 Application, 2 times daily PRN   valACYclovir (VALTREX) 1,000 mg, Daily PRN     VITALS:  There were no vitals filed for this visit.   Physical Exam  :     No data to display              No data to display             HEENT:  Normocephalic, atraumatic.  The superficial temporal arteries are without ropiness or tenderness. Cardiovascular: Regular rate and rhythm. Lungs: Clear to auscultation bilaterally. Neck: There are no carotid bruits noted bilaterally. Orientation:  Alert and oriented to person, place and not to time***. No aphasia or dysarthria. Fund of knowledge is appropriate. Recent and remote memory impaired.  Attention and concentration are reduced***.  Able to name objects and repeat phrases. *** Delayed recall  /5 .*** Cranial nerves: There is good facial symmetry. Extraocular muscles are intact and visual fields are full to confrontational testing. Speech is fluent and clear. No tongue deviation. Hearing is intact to conversational tone.*** Tone: Tone is good throughout. Sensation: Sensation is intact to light touch.  Vibration is intact at the bilateral big toe.  Coordination: The patient has no difficulty with RAM's or FNF bilaterally. Normal finger to nose  Motor: Strength is 5/5 in the bilateral upper and lower extremities. There is no pronator drift. There are no fasciculations noted. DTR's: Deep tendon reflexes are 2/4 bilaterally. Gait and Station: The patient is able to ambulate without difficulty. Gait is cautious and narrow. Stride length is normal. ***      Thank you  for allowing us  the opportunity to participate in the care of this nice patient. Please do not hesitate to contact us  for any questions or concerns.   Total time spent on today's visit was *** minutes dedicated to this patient today, preparing to see patient, examining the patient, ordering tests and/or medications and counseling the patient, documenting clinical information in the EHR or other health record, independently interpreting results and communicating results to the patient/family, discussing treatment and goals, answering patient's questions and coordinating care.  Cc:  Medicine, [facility]  [facility] 03/11/2024 3:30 PM

## 2024-03-12 ENCOUNTER — Ambulatory Visit (INDEPENDENT_AMBULATORY_CARE_PROVIDER_SITE_OTHER): Admitting: Physician Assistant

## 2024-03-12 ENCOUNTER — Ambulatory Visit

## 2024-03-12 ENCOUNTER — Other Ambulatory Visit: Payer: Self-pay

## 2024-03-12 ENCOUNTER — Other Ambulatory Visit

## 2024-03-12 ENCOUNTER — Encounter: Payer: Self-pay | Admitting: Physician Assistant

## 2024-03-12 DIAGNOSIS — R413 Other amnesia: Secondary | ICD-10-CM | POA: Diagnosis not present

## 2024-03-12 LAB — VITAMIN B12: Vitamin B-12: 353 pg/mL (ref 200–1100)

## 2024-03-12 LAB — TSH: TSH: 1.27 m[IU]/L (ref 0.40–4.50)

## 2024-03-13 ENCOUNTER — Ambulatory Visit: Payer: Self-pay | Admitting: Physician Assistant

## 2024-03-19 ENCOUNTER — Encounter

## 2024-03-21 ENCOUNTER — Ambulatory Visit: Admitting: Physical Therapy

## 2024-03-25 ENCOUNTER — Other Ambulatory Visit (HOSPITAL_COMMUNITY): Payer: Self-pay | Admitting: Cardiology

## 2024-04-03 ENCOUNTER — Ambulatory Visit: Attending: Otolaryngology

## 2024-04-03 DIAGNOSIS — R293 Abnormal posture: Secondary | ICD-10-CM | POA: Diagnosis present

## 2024-04-03 DIAGNOSIS — R42 Dizziness and giddiness: Secondary | ICD-10-CM | POA: Diagnosis present

## 2024-04-03 DIAGNOSIS — R2681 Unsteadiness on feet: Secondary | ICD-10-CM | POA: Diagnosis present

## 2024-04-03 DIAGNOSIS — Z483 Aftercare following surgery for neoplasm: Secondary | ICD-10-CM | POA: Diagnosis present

## 2024-04-03 DIAGNOSIS — R2689 Other abnormalities of gait and mobility: Secondary | ICD-10-CM | POA: Insufficient documentation

## 2024-04-03 NOTE — Therapy (Signed)
 OUTPATIENT PHYSICAL THERAPY VESTIBULAR TREATMENT     Patient Name: Judy Morrison MRN: [id number] DOB:03/24/1951, 73 y.o., female Today's Date: 04/03/2024  END OF SESSION:  PT End of Session - 04/03/24 1115     Visit Number 3    Number of Visits 17    Date for PT Re-Evaluation 04/27/24    Authorization Type Medicare/[employer]    PT Start Time 1115    PT Stop Time 1200    PT Time Calculation (min) 45 min    Activity Tolerance Patient tolerated treatment well    Behavior During Therapy [facility] for tasks assessed/performed          Past Medical History:  Diagnosis Date   Anemia    Anxiety    Arthritis    Breast cancer (HCC) 2024   right breast IDC   Depressed    Dermatitis    rare form, on prednisone   Dyspnea    with exertion mostly   GERD (gastroesophageal reflux disease)    controlled with Omeprazole    Glaucoma    Hypothyroidism    Peripheral neuropathy due to chemotherapy [facility])    Personal history of chemotherapy    Personal history of radiation therapy    Port-A-Cath in place 11/15/2022   Torn rotator cuff    RIGHT    Urinary tract infection    dx 05-26-17 took 1 week cipro  BID completed 06-02-17.   Past Surgical History:  Procedure Laterality Date   ADENOIDECTOMY     APPENDECTOMY     arthroscopic right knee      BREAST BIOPSY Right 09/01/2022   MM RT BREAST BX W LOC DEV 1ST LESION IMAGE BX SPEC STEREO GUIDE 09/01/2022 [facility]   BREAST BIOPSY  10/01/2022   MM RT RADIOACTIVE SEED LOC MAMMO GUIDE 10/01/2022 [facility]   BREAST LUMPECTOMY     BREAST LUMPECTOMY WITH RADIOACTIVE SEED AND SENTINEL LYMPH NODE BIOPSY Right 10/05/2022   Procedure: RIGHT BREAST LUMPECTOMY WITH RADIOACTIVE SEED AND AXILLARY SENTINEL LYMPH NODE BIOPSY;  Surgeon: Ebbie Cough, MD;  Location: [facility];  Service: General;  Laterality: Right;   CHOLECYSTECTOMY     COLONOSCOPY  06/06/2017   Pyrtle   Pelvic sling     x2   POLYPECTOMY      PORT A CATH REVISION N/A 11/22/2022   Procedure: PORT A CATH REVISION;  Surgeon: Ebbie Cough, MD;  Location: [facility] OR;  Service: General;  Laterality: N/A;   PORT-A-CATH REMOVAL Left 11/28/2023   Procedure: REMOVAL PORT-A-CATH;  Surgeon: Ebbie Cough, MD;  Location: [facility];  Service: General;  Laterality: Left;   PORTACATH PLACEMENT Left 10/05/2022   Procedure: INSERTION PORT-A-CATH;  Surgeon: Ebbie Cough, MD;  Location:  SURGERY CENTER;  Service: General;  Laterality: Left;   ROTATOR CUFF REPAIR Right    TONSILLECTOMY     TOTAL KNEE ARTHROPLASTY Right 08/12/2017   Procedure: RIGHT TOTAL KNEE ARTHROPLASTY;  Surgeon: Gerome Charleston, MD;  Location: [facility];  Service: Orthopedics;  Laterality: Right;   TOTAL KNEE ARTHROPLASTY Left 01/06/2018   Procedure: LEFT TOTAL KNEE ARTHROPLASTY;  Surgeon: Gerome Charleston, MD;  Location: [facility];  Service: Orthopedics;  Laterality: Left;   UPPER GASTROINTESTINAL ENDOSCOPY     WISDOM TOOTH EXTRACTION     Patient Active Problem List   Diagnosis Date Noted   Allergic rhinitis 01/18/2024   Dyspnea on exertion 01/12/2024   Suspected sleep apnea 01/12/2024   Sensorineural hearing loss, bilateral 10/11/2023  Cognitive changes 05/02/2023   Genetic testing 10/04/2022   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   Malignant neoplasm of upper-outer quadrant of right breast in female, estrogen receptor positive (HCC) 09/10/2022   Rash and other nonspecific skin eruption 09/15/2020   Allergic contact dermatitis 09/15/2020   Pruritus 08/14/2020   Dizziness 05/10/2018   Atypical chest pain 05/10/2018   Primary osteoarthritis of left knee 01/06/2018   S/P knee replacement 08/12/2017   Incomplete tear of right rotator cuff 05/19/2017   Osteopenia 05/19/2017   Urge incontinence of urine 05/19/2017   Primary open angle glaucoma (POAG) 12/30/2016   Primary osteoarthritis of both knees 12/30/2016   Recurrent oral  herpes simplex infection 12/30/2016   Tubular adenoma of colon 12/30/2016   Hypothyroidism 06/25/2016   Right foot pain 12/19/2014   Anxiety 05/21/2011   Seasonal allergies 05/21/2011    PCP: Medicine, [facility] REFERRING PROVIDER: Karis Clunes, MD   REFERRING DIAG: R42 (ICD-10-CM) - Dizziness   THERAPY DIAG:  Dizziness and giddiness  Unsteadiness on feet  Other abnormalities of gait and mobility  ONSET DATE: 02/15/2024  Rationale for Evaluation and Treatment: Rehabilitation  SUBJECTIVE:   SUBJECTIVE STATEMENT: Been away on a trip.  Note the imbalance mostly comes on with fatigue, no dizziness Pt accompanied by: self  PERTINENT HISTORY: breast cancer,hospitalization for bilateral pulmonary infiltrates with dyspnea, CHF, other PMH above  PAIN:  Are you having pain? No  PRECAUTIONS: Fall  RED FLAGS: None   WEIGHT BEARING RESTRICTIONS: No  FALLS: Has patient fallen in last 6 months? No  LIVING ENVIRONMENT: Lives with: lives alone Lives in: Other 3 story condo-stairs/elevator to 2nd floor Stairs: and elevator Has following equipment at home: Single point cane and Environmental consultant - 4 wheeled  PLOF: Independent, Independent with household mobility without device, Independent with community mobility with device, and Independent with community mobility without device  PATIENT GOALS: To work on the balance  OBJECTIVE:   TODAY'S TREATMENT: 04/03/24 Activity Comments  Hip abduction strength RLE 3+/5 LLE 3/5  Single limb support 2x30 sec Foot elevated on 2 step  Hip hike 1x10   Retro-walk 2x45 ft CGA  Tandem walk RLE Trendelenberg more prominent and unsteady--cues for hip ext engagement with improved   Sidelying hip abd 2x10 Cues/assist for isolation w/ LLE    TODAY'S TREATMENT: 03/05/24 Activity Comments  5xSTS, TUG   FGA 23/30  Manual muscle test See chart  Bridge w/ ankle PF/DF 2x10 Green loop around knees  Sidelying clamshell 2x10 Green loop knees        PATIENT EDUCATION: Education details: Eval results, POC, rationale for vestibular rehab to assist with balance; monitor O2 sats and ask MD about this if it still is running <90% Person educated: Patient Education method: Explanation Education comprehension: verbalized understanding  HOME EXERCISE PROGRAM: Access Code: [email or web address] URL: [email or web address] Date: 03/05/2024 Prepared by: Burnard Sandifer  Exercises - Supine Bridge with Resistance Band  - 2-3 x weekly - 1-2 sets - 10 reps - Bridge with Ankle Dorsiflexion  - 2-3 x weekly - 1-3 sets - 10 reps - Clamshell with Resistance  - 2-3 x weekly - 2-3 sets - 10 reps - Hip Hiking on Step  - 2-3 x weekly - 3 sets - 10 reps - Sidelying Hip Abduction  - 1 x daily - 7 x weekly - 3 sets - 10 reps     Note: Objective measures were completed at Evaluation unless otherwise noted.  DIAGNOSTIC FINDINGS: NA for  this episode  COGNITION: Overall cognitive status: Within functional limits for tasks assessed and Reports changes in cognitive function   SENSATION: Not tested  POSTURE:  rounded shoulders and L pelvis/trunk posteriorly rotated  LOWER EXTREMITY MMT: NT at eval  MMT Right eval Left eval  Hip flexion    Hip abduction 3- 3-  Hip adduction    Hip extension 3+ 3+  Hip internal rotation 4- 4-  Hip external rotation 3 3  Knee flexion 4 4  Knee extension 4+ 4+  Ankle dorsiflexion    Ankle plantarflexion    Ankle inversion    Ankle eversion    (Blank rows = not tested)  TRANSFERS: Assistive device utilized: None  Sit to stand: Modified independence Stand to sit: Modified independence  GAIT: Gait pattern: LLE tends to cross midline, decreased arm swing, step through pattern, decreased step length- Right, decreased step length- Left, scissoring, and narrow BOS Distance walked: 50 Assistive device utilized: None Level of assistance: SBA Comments: Slowed, guarded gait pattern without device  PATIENT  SURVEYS:  DHI 20  VESTIBULAR ASSESSMENT:  GENERAL OBSERVATION: No acute distress, but pt does seem SOB.  Vitals:  85%>92-93%, HR 80 bpm   SYMPTOM BEHAVIOR:  Subjective history: Reports dizziness is off balance  Non-Vestibular symptoms: changes in hearing and balance  Type of dizziness: Imbalance (Disequilibrium) and Unsteady with head/body turns  Frequency: daily  Duration: varies  Aggravating factors: Induced by motion: occur when walking, looking up at the ceiling, bending down to the ground, turning body quickly, and turning head quickly  Relieving factors: slow movements  Progression of symptoms: unchanged  OCULOMOTOR EXAM:  Ocular Alignment: normal  Ocular ROM: No Limitations  Spontaneous Nystagmus: absent  Gaze-Induced Nystagmus: absent  Smooth Pursuits: intact  Saccades: intact  Convergence/Divergence: 6 cm    VESTIBULAR - OCULAR REFLEX:   Slow VOR: Comment: unsettled feeling with horizontal 5/10; same unsettled/off balance feeling 4/10   VOR Cancellation: Normal  Head-Impulse Test: HIT Right: negative HIT Left: positive  Dynamic Visual Acuity: NT     MOTION SENSITIVITY:  Motion Sensitivity Quotient Intensity: 0 = none, 1 = Lightheaded, 2 = Mild, 3 = Moderate, 4 = Severe, 5 = Vomiting  Intensity  1. Sitting to supine   2. Supine to L side   3. Supine to R side   4. Supine to sitting   5. L Hallpike-Dix   6. Up from L    7. R Hallpike-Dix   8. Up from R    9. Sitting, head tipped to L knee   10. Head up from L knee   11. Sitting, head tipped to R knee   12. Head up from R knee   13. Sitting head turns x5   14.Sitting head nods x5   15. In stance, 180 turn to L    16. In stance, 180 turn to R     OTHOSTATICS: not done  FUNCTIONAL GAIT: 10 meter walk test: 16.68 sec = 1.95 ft/sec   M-CTSIB  Condition 1: Firm Surface, EO 30 Sec, Mild Sway  Condition 2: Firm Surface, EC 19.97 sec Sec, Severe and unable to maintain Sway  Condition 3: Foam  Surface, EO 30 Sec, Mild Sway  Condition 4: Foam Surface, EC 3.34 Sec, Severe Sway  O2 sats at end of this test:  87%, HR 102 bpm  TREATMENT DATE: 03/01/2024     GOALS: Goals reviewed with patient? Yes  SHORT TERM GOALS: Target date: 03/30/2024  Pt will be independent with HEP for improved balance, gait. Baseline: Goal status: MET  LONG TERM GOALS: Target date: 04/27/2024  Pt will be independent with HEP for improved balance, dizziness, gait. Baseline:  Goal status: INITIAL  2.  FGA to be assessed and score to improve to at least 25/30 for decreased fall risk. Baseline: 23/30 Goal status: REVISED  3.  Gait velocity to improve to at least 2.3 ft/sec for improved gait efficiency and safety. Baseline: 1.7 ft/sec Goal status: INITIAL  4.  MCSTIB Condition 2 and 4 to improve to 30 sec with mod or better sway for improved balance. Baseline:  Goal status: INITIAL  5.  Pt will verbalize understanding of fall prevention in home environment.  Baseline:  Goal status: INITIAL  6.  DHI to improve to less than or equal to 10, for improved balance, decreased dizziness. Baseline: 20 Goal status: INITIAL  ASSESSMENT:  CLINICAL IMPRESSION: Demo improved bilateral hip abduction strength to manual muscle testing. Instructed in closed chain glute med strength to address R trendelenberg gait with good tolerance and performance with onset of fatigue and decreased pelvic control in stance as result. Tandem walking with pt hands on hips to highlight right Trendelenberg with good self-correction to cue for glute recruitment in terminal stance with improved stability as result. Provided update to HEP for more advanced glute med strength. Continued POC to progress objectives and improve mobility/reduce fall risk  OBJECTIVE IMPAIRMENTS: Abnormal gait, decreased balance, decreased  mobility, difficulty walking, dizziness, and postural dysfunction.   ACTIVITY LIMITATIONS: standing, transfers, and locomotion level  PARTICIPATION LIMITATIONS: shopping, community activity, and travel  PERSONAL FACTORS: 3+ comorbidities: see above are also affecting patient's functional outcome.   REHAB POTENTIAL: Good  CLINICAL DECISION MAKING: Evolving/moderate complexity  EVALUATION COMPLEXITY: Moderate   PLAN:  PT FREQUENCY: 2x/week  PT DURATION: 8 weeks  PLANNED INTERVENTIONS: 97750- Physical Performance Testing, 97110-Therapeutic exercises, 97530- Therapeutic activity, 97112- Neuromuscular re-education, 97535- Self Care, [id number]- Manual therapy, [phone]- Gait training, Patient/Family education, Balance training, and Vestibular training  PLAN FOR NEXT SESSION: Review and progress HEP   Jonette MARLA Sandifer, PT 04/03/2024, 11:16 AM   [facility] at [facility] [address] [place], [place] [address] Phone # [phone] Fax # [phone]

## 2024-04-08 ENCOUNTER — Ambulatory Visit
Admission: RE | Admit: 2024-04-08 | Discharge: 2024-04-08 | Disposition: A | Discharge status: Home / Self Care | Source: Ambulatory Visit | Attending: Physician Assistant | Admitting: Physician Assistant

## 2024-04-08 DIAGNOSIS — R413 Other amnesia: Secondary | ICD-10-CM

## 2024-04-08 MED ORDER — GADOPICLENOL 0.5 MMOL/ML IV SOLN
6.0000 mL | Freq: Once | INTRAVENOUS | Status: AC | PRN
  Administered 2024-04-08: 6 mL via INTRAVENOUS

## 2024-04-09 ENCOUNTER — Ambulatory Visit: Attending: General Surgery

## 2024-04-09 VITALS — Wt 170.5 lb

## 2024-04-09 DIAGNOSIS — Z483 Aftercare following surgery for neoplasm: Secondary | ICD-10-CM | POA: Insufficient documentation

## 2024-04-09 NOTE — Therapy (Signed)
 OUTPATIENT PHYSICAL THERAPY SOZO SCREENING NOTE   Patient Name: Judy Morrison MRN: [id number] DOB:1951/06/22, 73 y.o., female Today's Date: 04/09/2024  PCP: Medicine, [facility] REFERRING PROVIDER: Ebbie Cough, MD   PT End of Session - 04/09/24 1540     Visit Number 3   # unchanged due to screen only   PT Start Time 1538    PT Stop Time 1542    PT Time Calculation (min) 4 min    Activity Tolerance Patient tolerated treatment well    Behavior During Therapy [facility] for tasks assessed/performed          Past Medical History:  Diagnosis Date   Anemia    Anxiety    Arthritis    Breast cancer (HCC) 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   Past Surgical History:  Procedure Laterality Date   ADENOIDECTOMY     APPENDECTOMY     arthroscopic right knee      BREAST BIOPSY Right 09/01/2022   MM RT BREAST BX W LOC DEV 1ST LESION IMAGE BX SPEC STEREO GUIDE 09/01/2022 [facility]   BREAST BIOPSY  10/01/2022   MM RT RADIOACTIVE SEED LOC MAMMO GUIDE 10/01/2022 [facility]   BREAST LUMPECTOMY     BREAST LUMPECTOMY WITH RADIOACTIVE SEED AND SENTINEL LYMPH NODE BIOPSY Right 10/05/2022   Procedure: RIGHT BREAST LUMPECTOMY WITH RADIOACTIVE SEED AND AXILLARY SENTINEL LYMPH NODE BIOPSY;  Surgeon: Ebbie Cough, MD;  Location: [facility];  Service: General;  Laterality: Right;   CHOLECYSTECTOMY     COLONOSCOPY  06/06/2017   Pyrtle   Pelvic sling     x2   POLYPECTOMY     PORT A  CATH REVISION N/A 11/22/2022   Procedure: PORT A CATH REVISION;  Surgeon: Ebbie Cough, MD;  Location: [facility];  Service: General;  Laterality: N/A;   PORT-A-CATH REMOVAL Left 11/28/2023   Procedure: REMOVAL PORT-A-CATH;  Surgeon: Ebbie Cough, MD;  Location: [facility];  Service: General;  Laterality: Left;   PORTACATH PLACEMENT Left 10/05/2022   Procedure: INSERTION PORT-A-CATH;  Surgeon: Ebbie Cough, MD;  Location: [facility];  Service: General;  Laterality: Left;   ROTATOR CUFF REPAIR Right    TONSILLECTOMY     TOTAL KNEE ARTHROPLASTY Right 08/12/2017   Procedure: RIGHT TOTAL KNEE ARTHROPLASTY;  Surgeon: Gerome Charleston, MD;  Location: [facility];  Service: Orthopedics;  Laterality: Right;   TOTAL KNEE ARTHROPLASTY Left 01/06/2018   Procedure: LEFT TOTAL KNEE ARTHROPLASTY;  Surgeon: Gerome Charleston, MD;  Location: [facility];  Service: Orthopedics;  Laterality: Left;   UPPER GASTROINTESTINAL ENDOSCOPY     WISDOM TOOTH EXTRACTION     Patient Active Problem List   Diagnosis Date Noted   Allergic rhinitis 01/18/2024   Dyspnea on exertion 01/12/2024   Suspected sleep apnea 01/12/2024   Sensorineural hearing loss, bilateral 10/11/2023   Cognitive changes 05/02/2023   Genetic  testing 10/04/2022   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   Malignant neoplasm of upper-outer quadrant of right breast in female, estrogen receptor positive (HCC) 09/10/2022   Rash and other nonspecific skin eruption 09/15/2020   Allergic contact dermatitis 09/15/2020   Pruritus 08/14/2020   Dizziness 05/10/2018   Atypical chest pain 05/10/2018   Primary osteoarthritis of left knee 01/06/2018   S/P knee replacement 08/12/2017   Incomplete tear of right rotator cuff 05/19/2017   Osteopenia 05/19/2017   Urge incontinence of urine 05/19/2017   Primary open angle glaucoma (POAG) 12/30/2016   Primary osteoarthritis of both knees 12/30/2016   Recurrent oral herpes simplex  infection 12/30/2016   Tubular adenoma of colon 12/30/2016   Hypothyroidism 06/25/2016   Right foot pain 12/19/2014   Anxiety 05/21/2011   Seasonal allergies 05/21/2011    REFERRING DIAG: right breast cancer at risk for lymphedema  THERAPY DIAG: Aftercare following surgery for neoplasm  PERTINENT HISTORY: Patient was diagnosed on 08/02/2022 with right grade 2 invasive ductal carcinoma breast cancer. It measures 1.5 cm and is located in the upper outer quadrant. It is triple positive with a Ki67 of 5%. She had a rotator cuff repair in her right shoulder 10/01/2021 which continues to be problematic. She had a right lumpectomy with SLNB on 10/05/2022   PRECAUTIONS: right UE Lymphedema risk, None  SUBJECTIVE: Pt returns for her 3 month L-Dex screen. My balance has been off. I'm getting neuro PT.  PAIN:  Are you having pain? No  SOZO SCREENING: Patient was assessed today using the SOZO machine to determine the lymphedema index score. This was compared to her baseline score. It was determined that she is within the recommended range when compared to her baseline and no further action is needed at this time. She will continue SOZO screenings. These are done every 3 months for 2 years post operatively followed by every 6 months for 2 years, and then annually.   L-DEX FLOWSHEETS - 04/09/24 1500       L-DEX LYMPHEDEMA SCREENING   Measurement Type Unilateral    L-DEX MEASUREMENT EXTREMITY Upper Extremity    POSITION  Standing    DOMINANT SIDE Right    At Risk Side Right    BASELINE SCORE (UNILATERAL) -3.4    L-DEX SCORE (UNILATERAL) -1.5    VALUE CHANGE (UNILAT) 1.9           Aden Berwyn Caldron, PTA 04/09/2024, 3:41 PM

## 2024-04-10 ENCOUNTER — Encounter: Payer: Self-pay | Admitting: Physical Therapy

## 2024-04-10 ENCOUNTER — Ambulatory Visit: Admitting: Physical Therapy

## 2024-04-10 DIAGNOSIS — R293 Abnormal posture: Secondary | ICD-10-CM

## 2024-04-10 DIAGNOSIS — R2689 Other abnormalities of gait and mobility: Secondary | ICD-10-CM

## 2024-04-10 DIAGNOSIS — Z483 Aftercare following surgery for neoplasm: Secondary | ICD-10-CM

## 2024-04-10 DIAGNOSIS — R2681 Unsteadiness on feet: Secondary | ICD-10-CM

## 2024-04-10 DIAGNOSIS — R42 Dizziness and giddiness: Secondary | ICD-10-CM

## 2024-04-10 NOTE — Therapy (Signed)
 OUTPATIENT PHYSICAL THERAPY VESTIBULAR TREATMENT     Patient Name: Judy Morrison MRN: [id number] DOB:[birth year], 73 y.o., female Today's Date: 04/10/2024  END OF SESSION:  PT End of Session - 04/10/24 1148     Visit Number 4    Number of Visits 17    Date for PT Re-Evaluation 04/27/24    Authorization Type Medicare/[employer]    PT Start Time 1148    PT Stop Time 1226    PT Time Calculation (min) 38 min    Activity Tolerance Patient tolerated treatment well    Behavior During Therapy WFL for tasks assessed/performed           Past Medical History:  Diagnosis Date   Anemia    Anxiety    Arthritis    Breast cancer (HCC) 2024   right breast IDC   Depressed    Dermatitis    rare form, on prednisone   Dyspnea    with exertion mostly   GERD (gastroesophageal reflux disease)    controlled with Omeprazole    Glaucoma    Hypothyroidism    Peripheral neuropathy due to chemotherapy [facility])    Personal history of chemotherapy    Personal history of radiation therapy    Port-A-Cath in place 11/15/2022   Torn rotator cuff    RIGHT    Urinary tract infection    dx 05-26-17 took 1 week cipro  BID completed 06-02-17.   Past Surgical History:  Procedure Laterality Date   ADENOIDECTOMY     APPENDECTOMY     arthroscopic right knee      BREAST BIOPSY Right 09/01/2022   MM RT BREAST BX W LOC DEV 1ST LESION IMAGE BX SPEC STEREO GUIDE 09/01/2022 [facility]   BREAST BIOPSY  10/01/2022   MM RT RADIOACTIVE SEED LOC MAMMO GUIDE 10/01/2022 [facility]   BREAST LUMPECTOMY     BREAST LUMPECTOMY WITH RADIOACTIVE SEED AND SENTINEL LYMPH NODE BIOPSY Right 10/05/2022   Procedure: RIGHT BREAST LUMPECTOMY WITH RADIOACTIVE SEED AND AXILLARY SENTINEL LYMPH NODE BIOPSY;  Surgeon: Ebbie Cough, MD;  Location: [facility];  Service: General;  Laterality: Right;   CHOLECYSTECTOMY     COLONOSCOPY  06/06/2017   Pyrtle   Pelvic sling     x2   POLYPECTOMY      PORT A CATH REVISION N/A 11/22/2022   Procedure: PORT A CATH REVISION;  Surgeon: Ebbie Cough, MD;  Location: [facility];  Service: General;  Laterality: N/A;   PORT-A-CATH REMOVAL Left 11/28/2023   Procedure: REMOVAL PORT-A-CATH;  Surgeon: Ebbie Cough, MD;  Location: [facility];  Service: General;  Laterality: Left;   PORTACATH PLACEMENT Left 10/05/2022   Procedure: INSERTION PORT-A-CATH;  Surgeon: Ebbie Cough, MD;  Location: [facility];  Service: General;  Laterality: Left;   ROTATOR CUFF REPAIR Right    TONSILLECTOMY     TOTAL KNEE ARTHROPLASTY Right 08/12/2017   Procedure: RIGHT TOTAL KNEE ARTHROPLASTY;  Surgeon: Gerome Charleston, MD;  Location: [facility];  Service: Orthopedics;  Laterality: Right;   TOTAL KNEE ARTHROPLASTY Left 01/06/2018   Procedure: LEFT TOTAL KNEE ARTHROPLASTY;  Surgeon: Gerome Charleston, MD;  Location: [facility];  Service: Orthopedics;  Laterality: Left;   UPPER GASTROINTESTINAL ENDOSCOPY     WISDOM TOOTH EXTRACTION     Patient Active Problem List   Diagnosis Date Noted   Allergic rhinitis 01/18/2024   Dyspnea on exertion 01/12/2024   Suspected sleep apnea 01/12/2024   Sensorineural hearing loss, bilateral  10/11/2023   Cognitive changes 05/02/2023   Genetic testing 10/04/2022   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   Malignant neoplasm of upper-outer quadrant of right breast in female, estrogen receptor positive (HCC) 09/10/2022   Rash and other nonspecific skin eruption 09/15/2020   Allergic contact dermatitis 09/15/2020   Pruritus 08/14/2020   Dizziness 05/10/2018   Atypical chest pain 05/10/2018   Primary osteoarthritis of left knee 01/06/2018   S/P knee replacement 08/12/2017   Incomplete tear of right rotator cuff 05/19/2017   Osteopenia 05/19/2017   Urge incontinence of urine 05/19/2017   Primary open angle glaucoma (POAG) 12/30/2016   Primary osteoarthritis of both knees 12/30/2016   Recurrent oral  herpes simplex infection 12/30/2016   Tubular adenoma of colon 12/30/2016   Hypothyroidism 06/25/2016   Right foot pain 12/19/2014   Anxiety 05/21/2011   Seasonal allergies 05/21/2011    PCP: Medicine, [facility] REFERRING PROVIDER: Karis Clunes, MD   REFERRING DIAG: R42 (ICD-10-CM) - Dizziness   THERAPY DIAG:  Aftercare following surgery for neoplasm  Dizziness and giddiness  Unsteadiness on feet  Other abnormalities of gait and mobility  Abnormal posture  ONSET DATE: 02/15/2024  Rationale for Evaluation and Treatment: Rehabilitation  SUBJECTIVE:   SUBJECTIVE STATEMENT: Some days are better than others. Today she feels a little wobbly. Yesterday she was doing great. Overall does think there are more better days. Has been able to do some of the exercises. 0/10 dizziness. 6/10 imbalance feeling.  Pt accompanied by: self  PERTINENT HISTORY: breast cancer,hospitalization for bilateral pulmonary infiltrates with dyspnea, CHF, other PMH above  PAIN:  Are you having pain? No  PRECAUTIONS: Fall  RED FLAGS: None   WEIGHT BEARING RESTRICTIONS: No  FALLS: Has patient fallen in last 6 months? No  LIVING ENVIRONMENT: Lives with: lives alone Lives in: Other 3 story condo-stairs/elevator to 2nd floor Stairs: and elevator Has following equipment at home: Single point cane and Environmental consultant - 4 wheeled  PLOF: Independent, Independent with household mobility without device, Independent with community mobility with device, and Independent with community mobility without device  PATIENT GOALS: To work on the balance  OBJECTIVE:   TODAY'S TREATMENT: 04/10/2024 Activity Comments  Sidelying clamshell green TB 2x10 Cues to keep hips forward  Bridge with green TB around knees 2x10   Supine hip abd green TB around ankles 2x10 Cues to keep from hip ER  Hip hike 1x10 In // bars with UE support  Single limb support 3x20 sec With head turns x10 With head nods x10 Foot  elevated on 4 step; cues to keep from trendelenburg. Challenged with L LE weight shift/stability  Fwd walking VOR x1; 2x40' Very unsteady with multiple LOBs laterally, very narrow BOS with intermittent L LE scissoring  Fwd walking just keeping eyes on target 4x40' CGA, cues to keep wider BOS and perform arm swing  Standing feet apart VOR x 1 head nods and then head turns Difficulty with maintaining gaze stability with head nods Greater difficulty with maintaining gaze stability with R head turn          PATIENT EDUCATION: Education details: Eval results, POC, rationale for vestibular rehab to assist with balance; monitor O2 sats and ask MD about this if it still is running <90% Person educated: Patient Education method: Explanation Education comprehension: verbalized understanding  HOME EXERCISE PROGRAM: Access Code: [email or web address] URL: [email or web address] Date: 04/10/2024 Prepared by: Ranny Wiebelhaus April Earnie Starring  Exercises - Supine Bridge with Resistance Band  - 2-3  x weekly - 1-2 sets - 10 reps - Bridge with Ankle Dorsiflexion  - 2-3 x weekly - 1-3 sets - 10 reps - Clamshell with Resistance  - 2-3 x weekly - 2-3 sets - 10 reps - Hip Hiking on Step  - 2-3 x weekly - 3 sets - 10 reps - Sidelying Hip Abduction  - 1 x daily - 7 x weekly - 3 sets - 10 reps - Romberg Stance on Foam Pad with Head Rotation  - 1 x daily - 7 x weekly - 2 sets - 10 reps - Romberg Stance with Head Nods on Foam Pad  - 1 x daily - 7 x weekly - 2 sets - 10 reps - Standing Gaze Stabilization with Head Rotation  - 1 x daily - 7 x weekly - 2 sets - 10 reps - Standing Gaze Stabilization with Head Nod  - 1 x daily - 7 x weekly - 2 sets - 10 reps     Note: Objective measures were completed at Evaluation unless otherwise noted.  DIAGNOSTIC FINDINGS: NA for this episode  COGNITION: Overall cognitive status: Within functional limits for tasks assessed and Reports changes in cognitive  function   SENSATION: Not tested  POSTURE:  rounded shoulders and L pelvis/trunk posteriorly rotated  LOWER EXTREMITY MMT: NT at eval  MMT Right eval Left eval R/L 04/03/24  Hip flexion     Hip abduction 3- 3- 3+/3  Hip adduction     Hip extension 3+ 3+   Hip internal rotation 4- 4-   Hip external rotation 3 3   Knee flexion 4 4   Knee extension 4+ 4+   Ankle dorsiflexion     Ankle plantarflexion     Ankle inversion     Ankle eversion     (Blank rows = not tested)  TRANSFERS: Assistive device utilized: None  Sit to stand: Modified independence Stand to sit: Modified independence  GAIT: Gait pattern: LLE tends to cross midline, decreased arm swing, step through pattern, decreased step length- Right, decreased step length- Left, scissoring, and narrow BOS Distance walked: 50 Assistive device utilized: None Level of assistance: SBA Comments: Slowed, guarded gait pattern without device  PATIENT SURVEYS:  DHI 20  VESTIBULAR ASSESSMENT:  GENERAL OBSERVATION: No acute distress, but pt does seem SOB.  Vitals:  85%>92-93%, HR 80 bpm   SYMPTOM BEHAVIOR:  Subjective history: Reports dizziness is off balance  Non-Vestibular symptoms: changes in hearing and balance  Type of dizziness: Imbalance (Disequilibrium) and Unsteady with head/body turns  Frequency: daily  Duration: varies  Aggravating factors: Induced by motion: occur when walking, looking up at the ceiling, bending down to the ground, turning body quickly, and turning head quickly  Relieving factors: slow movements  Progression of symptoms: unchanged  OCULOMOTOR EXAM:  Ocular Alignment: normal  Ocular ROM: No Limitations  Spontaneous Nystagmus: absent  Gaze-Induced Nystagmus: absent  Smooth Pursuits: intact  Saccades: intact  Convergence/Divergence: 6 cm    VESTIBULAR - OCULAR REFLEX:   Slow VOR: Comment: unsettled feeling with horizontal 5/10; same unsettled/off balance feeling 4/10   VOR  Cancellation: Normal  Head-Impulse Test: HIT Right: negative HIT Left: positive  Dynamic Visual Acuity: NT     MOTION SENSITIVITY:  Motion Sensitivity Quotient Intensity: 0 = none, 1 = Lightheaded, 2 = Mild, 3 = Moderate, 4 = Severe, 5 = Vomiting  Intensity  1. Sitting to supine   2. Supine to L side   3. Supine to R side  4. Supine to sitting   5. L Hallpike-Dix   6. Up from L    7. R Hallpike-Dix   8. Up from R    9. Sitting, head tipped to L knee   10. Head up from L knee   11. Sitting, head tipped to R knee   12. Head up from R knee   13. Sitting head turns x5   14.Sitting head nods x5   15. In stance, 180 turn to L    16. In stance, 180 turn to R     OTHOSTATICS: not done  FUNCTIONAL GAIT: 10 meter walk test: 16.68 sec = 1.95 ft/sec   M-CTSIB  Condition 1: Firm Surface, EO 30 Sec, Mild Sway  Condition 2: Firm Surface, EC 19.97 sec Sec, Severe and unable to maintain Sway  Condition 3: Foam Surface, EO 30 Sec, Mild Sway  Condition 4: Foam Surface, EC 3.34 Sec, Severe Sway  O2 sats at end of this test:  87%, HR 102 bpm                                                                                                                           TREATMENT DATE: 03/01/2024     GOALS: Goals reviewed with patient? Yes  SHORT TERM GOALS: Target date: 03/30/2024  Pt will be independent with HEP for improved balance, gait. Baseline: Goal status: MET  LONG TERM GOALS: Target date: 04/27/2024  Pt will be independent with HEP for improved balance, dizziness, gait. Baseline:  Goal status: INITIAL  2.  FGA to be assessed and score to improve to at least 25/30 for decreased fall risk. Baseline: 23/30 Goal status: REVISED  3.  Gait velocity to improve to at least 2.3 ft/sec for improved gait efficiency and safety. Baseline: 1.7 ft/sec Goal status: INITIAL  4.  MCSTIB Condition 2 and 4 to improve to 30 sec with mod or better sway for improved balance. Baseline:   Goal status: INITIAL  5.  Pt will verbalize understanding of fall prevention in home environment.  Baseline:  Goal status: INITIAL  6.  DHI to improve to less than or equal to 10, for improved balance, decreased dizziness. Baseline: 20 Goal status: INITIAL  ASSESSMENT:  CLINICAL IMPRESSION: Treatment focused on continuing to progress hip strengthening. Worked on decreasing trendelenburg pattern with standing balance exercises. Concentrated on static and dynamic balance/stability. Still demos hip instability with amb. Very narrow BOS with amb this session and LOBs. Difficulty with performing amb with head turns. Found some difficulty maintaining gaze stability during VOR exercises.   OBJECTIVE IMPAIRMENTS: Abnormal gait, decreased balance, decreased mobility, difficulty walking, dizziness, and postural dysfunction.   ACTIVITY LIMITATIONS: standing, transfers, and locomotion level  PARTICIPATION LIMITATIONS: shopping, community activity, and travel  PERSONAL FACTORS: 3+ comorbidities: see above are also affecting patient's functional outcome.   REHAB POTENTIAL: Good  CLINICAL DECISION MAKING: Evolving/moderate complexity  EVALUATION COMPLEXITY: Moderate   PLAN:  PT FREQUENCY: 2x/week  PT DURATION: 8 weeks  PLANNED INTERVENTIONS: 97750- Physical Performance Testing, 97110-Therapeutic exercises, 97530- Therapeutic activity, 97112- Neuromuscular re-education, [phone]- Self Care, [id number]- Manual therapy, [phone]- Gait training, Patient/Family education, Balance training, and Vestibular training  PLAN FOR NEXT SESSION: Review and progress HEP   Glennette Galster April Ma L Seeley [place], PT, DPT 04/10/2024, 11:48 AM   [facility] [address] [place], [address] [place], [place] [address] Phone # [phone] Fax # [phone]

## 2024-04-17 ENCOUNTER — Ambulatory Visit: Attending: Otolaryngology | Admitting: Physical Therapy

## 2024-04-17 ENCOUNTER — Encounter: Payer: Self-pay | Admitting: Physical Therapy

## 2024-04-17 DIAGNOSIS — R42 Dizziness and giddiness: Secondary | ICD-10-CM | POA: Diagnosis present

## 2024-04-17 DIAGNOSIS — R293 Abnormal posture: Secondary | ICD-10-CM | POA: Diagnosis present

## 2024-04-17 DIAGNOSIS — R2689 Other abnormalities of gait and mobility: Secondary | ICD-10-CM | POA: Diagnosis present

## 2024-04-17 DIAGNOSIS — R2681 Unsteadiness on feet: Secondary | ICD-10-CM | POA: Diagnosis present

## 2024-04-17 NOTE — Therapy (Signed)
 OUTPATIENT PHYSICAL THERAPY VESTIBULAR TREATMENT     Patient Name: Judy Morrison MRN: [id number] DOB:08-08-1951, 73 y.o., female Today's Date: 04/17/2024  END OF SESSION:  PT End of Session - 04/17/24 1107     Visit Number 5    Number of Visits 17    Date for PT Re-Evaluation 04/27/24    Authorization Type Medicare/[employer]    PT Start Time 1107    PT Stop Time 1145    PT Time Calculation (min) 38 min    Activity Tolerance Patient tolerated treatment well    Behavior During Therapy WFL for tasks assessed/performed            Past Medical History:  Diagnosis Date   Anemia    Anxiety    Arthritis    Breast cancer (HCC) 2024   right breast IDC   Depressed    Dermatitis    rare form, on prednisone   Dyspnea    with exertion mostly   GERD (gastroesophageal reflux disease)    controlled with Omeprazole    Glaucoma    Hypothyroidism    Peripheral neuropathy due to chemotherapy [facility])    Personal history of chemotherapy    Personal history of radiation therapy    Port-A-Cath in place 11/15/2022   Torn rotator cuff    RIGHT    Urinary tract infection    dx 05-26-17 took 1 week cipro  BID completed 06-02-17.   Past Surgical History:  Procedure Laterality Date   ADENOIDECTOMY     APPENDECTOMY     arthroscopic right knee      BREAST BIOPSY Right 09/01/2022   MM RT BREAST BX W LOC DEV 1ST LESION IMAGE BX SPEC STEREO GUIDE 09/01/2022 [facility]   BREAST BIOPSY  10/01/2022   MM RT RADIOACTIVE SEED LOC MAMMO GUIDE 10/01/2022 [facility]   BREAST LUMPECTOMY     BREAST LUMPECTOMY WITH RADIOACTIVE SEED AND SENTINEL LYMPH NODE BIOPSY Right 10/05/2022   Procedure: RIGHT BREAST LUMPECTOMY WITH RADIOACTIVE SEED AND AXILLARY SENTINEL LYMPH NODE BIOPSY;  Surgeon: Ebbie Cough, MD;  Location: [facility];  Service: General;  Laterality: Right;   CHOLECYSTECTOMY     COLONOSCOPY  06/06/2017   Pyrtle   Pelvic sling     x2   POLYPECTOMY      PORT A CATH REVISION N/A 11/22/2022   Procedure: PORT A CATH REVISION;  Surgeon: Ebbie Cough, MD;  Location: [facility] OR;  Service: General;  Laterality: N/A;   PORT-A-CATH REMOVAL Left 11/28/2023   Procedure: REMOVAL PORT-A-CATH;  Surgeon: Ebbie Cough, MD;  Location: [facility];  Service: General;  Laterality: Left;   PORTACATH PLACEMENT Left 10/05/2022   Procedure: INSERTION PORT-A-CATH;  Surgeon: Ebbie Cough, MD;  Location:  SURGERY CENTER;  Service: General;  Laterality: Left;   ROTATOR CUFF REPAIR Right    TONSILLECTOMY     TOTAL KNEE ARTHROPLASTY Right 08/12/2017   Procedure: RIGHT TOTAL KNEE ARTHROPLASTY;  Surgeon: Gerome Charleston, MD;  Location: [facility];  Service: Orthopedics;  Laterality: Right;   TOTAL KNEE ARTHROPLASTY Left 01/06/2018   Procedure: LEFT TOTAL KNEE ARTHROPLASTY;  Surgeon: Gerome Charleston, MD;  Location: [facility];  Service: Orthopedics;  Laterality: Left;   UPPER GASTROINTESTINAL ENDOSCOPY     WISDOM TOOTH EXTRACTION     Patient Active Problem List   Diagnosis Date Noted   Allergic rhinitis 01/18/2024   Dyspnea on exertion 01/12/2024   Suspected sleep apnea 01/12/2024   Sensorineural hearing loss,  bilateral 10/11/2023   Cognitive changes 05/02/2023   Genetic testing 10/04/2022   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   Malignant neoplasm of upper-outer quadrant of right breast in female, estrogen receptor positive (HCC) 09/10/2022   Rash and other nonspecific skin eruption 09/15/2020   Allergic contact dermatitis 09/15/2020   Pruritus 08/14/2020   Dizziness 05/10/2018   Atypical chest pain 05/10/2018   Primary osteoarthritis of left knee 01/06/2018   S/P knee replacement 08/12/2017   Incomplete tear of right rotator cuff 05/19/2017   Osteopenia 05/19/2017   Urge incontinence of urine 05/19/2017   Primary open angle glaucoma (POAG) 12/30/2016   Primary osteoarthritis of both knees 12/30/2016   Recurrent oral  herpes simplex infection 12/30/2016   Tubular adenoma of colon 12/30/2016   Hypothyroidism 06/25/2016   Right foot pain 12/19/2014   Anxiety 05/21/2011   Seasonal allergies 05/21/2011    PCP: Medicine, [facility] REFERRING PROVIDER: Karis Clunes, MD   REFERRING DIAG: R42 (ICD-10-CM) - Dizziness   THERAPY DIAG:  Dizziness and giddiness  Unsteadiness on feet  Other abnormalities of gait and mobility  ONSET DATE: 02/15/2024  Rationale for Evaluation and Treatment: Rehabilitation  SUBJECTIVE:   SUBJECTIVE STATEMENT: Working on my balance is tough.  Don't have anything to stand on like the foam. Had trouble trying to get my shoes on with the single leg stance. Pt accompanied by: self  PERTINENT HISTORY: breast cancer,hospitalization for bilateral pulmonary infiltrates with dyspnea, CHF, other PMH above  PAIN:  Are you having pain? No  PRECAUTIONS: Fall  RED FLAGS: None   WEIGHT BEARING RESTRICTIONS: No  FALLS: Has patient fallen in last 6 months? No  LIVING ENVIRONMENT: Lives with: lives alone Lives in: Other 3 story condo-stairs/elevator to 2nd floor Stairs: and elevator Has following equipment at home: Single point cane and Environmental consultant - 4 wheeled  PLOF: Independent, Independent with household mobility without device, Independent with community mobility with device, and Independent with community mobility without device  PATIENT GOALS: To work on the balance  OBJECTIVE:   TODAY'S TREATMENT: 04/17/2024 Activity Comments  Standing on folded yoga mat:  -Romberg EO/EC head turns/nods -Partial tandem stance EO head turns/nods, EC x 10 sec Light UE support  SLS: -Step taps to 6 step, 10 reps -Step tap/hover over 6 step, 5 reps SLS with foot propped on step, 5 reps, 10-15 sec Light UE support  Forward/back step over obstacles x 10 UE support and cues for increased step length  Side step over obstacles x 10 UE support and cues for increased step length   Forward/back walking x 2 minutes Narrowed BOS  Braiding 15 ft x 2, R and L Pt with difficulty coordinated back step          PATIENT EDUCATION: Education details: Try to use yoga mat for compliant surface balance work at home Person educated: Patient Education method: Programmer, multimedia, Manufacturing engineer Education comprehension: verbalized/return demo understanding  HOME EXERCISE PROGRAM: Access Code: [email or web address] URL: [email or web address] Date: 04/10/2024 Prepared by: Gellen April Earnie Starring  Exercises - Supine Bridge with Resistance Band  - 2-3 x weekly - 1-2 sets - 10 reps - Bridge with Ankle Dorsiflexion  - 2-3 x weekly - 1-3 sets - 10 reps - Clamshell with Resistance  - 2-3 x weekly - 2-3 sets - 10 reps - Hip Hiking on Step  - 2-3 x weekly - 3 sets - 10 reps - Sidelying Hip Abduction  - 1 x daily - 7 x weekly -  3 sets - 10 reps - Romberg Stance on Foam Pad with Head Rotation  - 1 x daily - 7 x weekly - 2 sets - 10 reps - Romberg Stance with Head Nods on Foam Pad  - 1 x daily - 7 x weekly - 2 sets - 10 reps - Standing Gaze Stabilization with Head Rotation  - 1 x daily - 7 x weekly - 2 sets - 10 reps - Standing Gaze Stabilization with Head Nod  - 1 x daily - 7 x weekly - 2 sets - 10 reps     Note: Objective measures were completed at Evaluation unless otherwise noted.  DIAGNOSTIC FINDINGS: NA for this episode  COGNITION: Overall cognitive status: Within functional limits for tasks assessed and Reports changes in cognitive function   SENSATION: Not tested  POSTURE:  rounded shoulders and L pelvis/trunk posteriorly rotated  LOWER EXTREMITY MMT: NT at eval  MMT Right eval Left eval R/L 04/03/24  Hip flexion     Hip abduction 3- 3- 3+/3  Hip adduction     Hip extension 3+ 3+   Hip internal rotation 4- 4-   Hip external rotation 3 3   Knee flexion 4 4   Knee extension 4+ 4+   Ankle dorsiflexion     Ankle plantarflexion     Ankle inversion     Ankle eversion      (Blank rows = not tested)  TRANSFERS: Assistive device utilized: None  Sit to stand: Modified independence Stand to sit: Modified independence  GAIT: Gait pattern: LLE tends to cross midline, decreased arm swing, step through pattern, decreased step length- Right, decreased step length- Left, scissoring, and narrow BOS Distance walked: 50 Assistive device utilized: None Level of assistance: SBA Comments: Slowed, guarded gait pattern without device  PATIENT SURVEYS:  DHI 20  VESTIBULAR ASSESSMENT:  GENERAL OBSERVATION: No acute distress, but pt does seem SOB.  Vitals:  85%>92-93%, HR 80 bpm   SYMPTOM BEHAVIOR:  Subjective history: Reports dizziness is off balance  Non-Vestibular symptoms: changes in hearing and balance  Type of dizziness: Imbalance (Disequilibrium) and Unsteady with head/body turns  Frequency: daily  Duration: varies  Aggravating factors: Induced by motion: occur when walking, looking up at the ceiling, bending down to the ground, turning body quickly, and turning head quickly  Relieving factors: slow movements  Progression of symptoms: unchanged  OCULOMOTOR EXAM:  Ocular Alignment: normal  Ocular ROM: No Limitations  Spontaneous Nystagmus: absent  Gaze-Induced Nystagmus: absent  Smooth Pursuits: intact  Saccades: intact  Convergence/Divergence: 6 cm    VESTIBULAR - OCULAR REFLEX:   Slow VOR: Comment: unsettled feeling with horizontal 5/10; same unsettled/off balance feeling 4/10   VOR Cancellation: Normal  Head-Impulse Test: HIT Right: negative HIT Left: positive  Dynamic Visual Acuity: NT     MOTION SENSITIVITY:  Motion Sensitivity Quotient Intensity: 0 = none, 1 = Lightheaded, 2 = Mild, 3 = Moderate, 4 = Severe, 5 = Vomiting  Intensity  1. Sitting to supine   2. Supine to L side   3. Supine to R side   4. Supine to sitting   5. L Hallpike-Dix   6. Up from L    7. R Hallpike-Dix   8. Up from R    9. Sitting, head tipped to L knee    10. Head up from L knee   11. Sitting, head tipped to R knee   12. Head up from R knee   13. Sitting head  turns x5   14.Sitting head nods x5   15. In stance, 180 turn to L    16. In stance, 180 turn to R     OTHOSTATICS: not done  FUNCTIONAL GAIT: 10 meter walk test: 16.68 sec = 1.95 ft/sec   M-CTSIB  Condition 1: Firm Surface, EO 30 Sec, Mild Sway  Condition 2: Firm Surface, EC 19.97 sec Sec, Severe and unable to maintain Sway  Condition 3: Foam Surface, EO 30 Sec, Mild Sway  Condition 4: Foam Surface, EC 3.34 Sec, Severe Sway  O2 sats at end of this test:  87%, HR 102 bpm                                                                                                                           TREATMENT DATE: 03/01/2024     GOALS: Goals reviewed with patient? Yes  SHORT TERM GOALS: Target date: 03/30/2024  Pt will be independent with HEP for improved balance, gait. Baseline: Goal status: MET  LONG TERM GOALS: Target date: 04/27/2024  Pt will be independent with HEP for improved balance, dizziness, gait. Baseline:  Goal status: INITIAL  2.  FGA to be assessed and score to improve to at least 25/30 for decreased fall risk. Baseline: 23/30 Goal status: REVISED  3.  Gait velocity to improve to at least 2.3 ft/sec for improved gait efficiency and safety. Baseline: 1.7 ft/sec Goal status: INITIAL  4.  MCSTIB Condition 2 and 4 to improve to 30 sec with mod or better sway for improved balance. Baseline:  Goal status: INITIAL  5.  Pt will verbalize understanding of fall prevention in home environment.  Baseline:  Goal status: INITIAL  6.  DHI to improve to less than or equal to 10, for improved balance, decreased dizziness. Baseline: 20 Goal status: INITIAL  ASSESSMENT:  CLINICAL IMPRESSION: Pt presents today with no new complaints; she does report that she doesn't have anything as stiff as the foam at home.  Problem-solved through possibilities for compliant  surface balance at home-yoga mat would be appropriate to try for compliant surface.  Skilled PT session focused on balance activities, including compliant surface and SLS. Pt needs light UE support throughout. Pt will continue to benefit from skilled PT towards goals for improved functional mobility and decreased fall risk.   OBJECTIVE IMPAIRMENTS: Abnormal gait, decreased balance, decreased mobility, difficulty walking, dizziness, and postural dysfunction.   ACTIVITY LIMITATIONS: standing, transfers, and locomotion level  PARTICIPATION LIMITATIONS: shopping, community activity, and travel  PERSONAL FACTORS: 3+ comorbidities: see above are also affecting patient's functional outcome.   REHAB POTENTIAL: Good  CLINICAL DECISION MAKING: Evolving/moderate complexity  EVALUATION COMPLEXITY: Moderate   PLAN:  PT FREQUENCY: 2x/week  PT DURATION: 8 weeks  PLANNED INTERVENTIONS: 97750- Physical Performance Testing, 97110-Therapeutic exercises, 97530- Therapeutic activity, [id number]- Neuromuscular re-education, 97535- Self Care, [id number]- Manual therapy, [phone]- Gait training, Patient/Family education, Balance training, and Vestibular training  PLAN FOR NEXT SESSION: Check LTGs and  discuss POC   STARLET GREIG ORN., PT, DPT 04/17/2024, 11:48 AM   [facility] at [facility] [address] [place], [address] [place], [place] [address] Phone # [phone] Fax # [phone]

## 2024-04-19 ENCOUNTER — Encounter: Payer: Self-pay | Admitting: Emergency Medicine

## 2024-04-19 ENCOUNTER — Ambulatory Visit (INDEPENDENT_AMBULATORY_CARE_PROVIDER_SITE_OTHER): Admitting: Emergency Medicine

## 2024-04-19 VITALS — BP 114/78 | HR 87 | Temp 98.1°F | Ht 63.0 in | Wt 174.0 lb

## 2024-04-19 DIAGNOSIS — R9389 Abnormal findings on diagnostic imaging of other specified body structures: Secondary | ICD-10-CM | POA: Insufficient documentation

## 2024-04-19 DIAGNOSIS — J301 Allergic rhinitis due to pollen: Secondary | ICD-10-CM

## 2024-04-19 DIAGNOSIS — J4520 Mild intermittent asthma, uncomplicated: Secondary | ICD-10-CM | POA: Diagnosis not present

## 2024-04-19 NOTE — Assessment & Plan Note (Signed)
 She has benefited from the addition of Symbicort  80.  Plan to continue.  She needs a refresher on technique and also a spacer and we will take care of this today.  Albuterol as needed.  Reviewed the plans with her in detail

## 2024-04-19 NOTE — Assessment & Plan Note (Signed)
Continue loratadine 

## 2024-04-19 NOTE — Patient Instructions (Signed)
 We will plan to continue her Symbicort  2 puffs twice a day.  Rinse and gargle after using.  We will try to add a spacer to see if this helps with administration.  We will also review inhaler technique today Keep albuterol available use 2 puffs if needed for shortness of breath, chest tightness, wheezing. Get your surveillance CT scans of the chest with Dr. Crawford as planned.  We will follow these Follow in our office in 6 months, sooner if you have any problems. Follow with Dr. Shelah in 12 months.

## 2024-04-19 NOTE — Assessment & Plan Note (Signed)
 Mild peripheral upper lobe predominant interstitial change in the subpleural region, question radiation change, question left over from her pneumonitis from May 2025.  She will have repeat scans with oncology.  We will follow for stability.

## 2024-04-19 NOTE — Progress Notes (Signed)
   Subjective:    Patient ID: Judy Morrison, female    DOB: 12/22/1950, 73 y.o.   MRN: [id number]  HPI  ROV 04/19/2024 --73 year old woman who follows up today for shortness of breath.  I saw her in May after hospitalization for bilateral pulmonary infiltrates and dyspnea, question bacterial or viral infection versus diastolic CHF (in [place] ).  CT chest 12/26/2023 showed some peripheral interstitial situation anteriorly and some faint groundglass at the left apex (question radiation change).  Her pulmonary function testing shows mixed obstruction and restriction with a borderline bronchodilator response.  We tried initiating Symbicort  80 and albuterol as needed to see if she would get benefit.  Also continue loratadine  She does still have some occasional exertional SOB - question whether we are taking correctly. She doesn't really use albuterol. Family has occasionally heard wheeze, with exertion.     Review of Systems As per HPI      Objective:   Physical Exam Vitals:   04/19/24 1055  BP: 114/78  Pulse: 87  Temp: 98.1 F (36.7 C)  TempSrc: Temporal  SpO2: 97%  Weight: 174 lb (78.9 kg)  Height: 5' 3 (1.6 m)    Gen: Pleasant, well-nourished, in no distress,  normal affect  ENT: No lesions,  mouth clear,  oropharynx clear, no postnasal drip  Neck: No JVD, no stridor  Lungs: No use of accessory muscles, no crackles or wheezing on normal respiration, no wheeze on forced expiration  Cardiovascular: RRR, heart sounds normal, no murmur or gallops, no peripheral edema  Musculoskeletal: No deformities, no cyanosis or clubbing  Neuro: alert, awake, non focal  Skin: Warm, no lesions or rash      Assessment & Plan:   Asthma She has benefited from the addition of Symbicort  80.  Plan to continue.  She needs a refresher on technique and also a spacer and we will take care of this today.  Albuterol as needed.  Reviewed the plans with her in detail  Allergic  rhinitis Continue loratadine   Abnormal CT of the chest Mild peripheral upper lobe predominant interstitial change in the subpleural region, question radiation change, question left over from her pneumonitis from May 2025.  She will have repeat scans with oncology.  We will follow for stability.      Lamar Chris, MD, PhD 04/19/2024, 11:28 AM [facility] [phone] or if no answer before 7:00PM call [phone] For any issues after 7:00PM please call eLink [phone]

## 2024-04-22 ENCOUNTER — Other Ambulatory Visit: Payer: Self-pay | Admitting: Hematology and Oncology

## 2024-04-24 ENCOUNTER — Ambulatory Visit

## 2024-04-26 ENCOUNTER — Ambulatory Visit

## 2024-04-26 DIAGNOSIS — R293 Abnormal posture: Secondary | ICD-10-CM

## 2024-04-26 DIAGNOSIS — R42 Dizziness and giddiness: Secondary | ICD-10-CM

## 2024-04-26 DIAGNOSIS — R2681 Unsteadiness on feet: Secondary | ICD-10-CM

## 2024-04-26 DIAGNOSIS — R2689 Other abnormalities of gait and mobility: Secondary | ICD-10-CM

## 2024-04-26 NOTE — Therapy (Signed)
 OUTPATIENT PHYSICAL THERAPY VESTIBULAR TREATMENT and D/C Summary     Patient Name: Judy Morrison MRN: [id number] DOB:[birth year], 73 y.o., female Today's Date: 04/26/2024  PHYSICAL THERAPY DISCHARGE SUMMARY  Visits from Start of Care: 6  Current functional level related to goals / functional outcomes: See below for outcome measures.  Pt reports no instances of falling since resuming PT   Remaining deficits: Hip abduction weakness, dynamic balance deficits, LOB w/ condition 4 M-CTSIB   Education / Equipment: HEP and relevant adaptive strategies   Patient agrees to discharge. Patient goals were partially met. Patient is being discharged due to being pleased with the current functional level.  END OF SESSION:  PT End of Session - 04/26/24 1025     Visit Number 6    Number of Visits 17    Date for PT Re-Evaluation 04/27/24    Authorization Type Medicare/[employer]    PT Start Time 1020    PT Stop Time 1100    PT Time Calculation (min) 40 min    Activity Tolerance Patient tolerated treatment well    Behavior During Therapy WFL for tasks assessed/performed            Past Medical History:  Diagnosis Date   Anemia    Anxiety    Arthritis    Asthma 01/12/2024   Breast cancer (HCC) 2024   right breast IDC   Depressed    Dermatitis    rare form, on prednisone   Dyspnea    with exertion mostly   GERD (gastroesophageal reflux disease)    controlled with Omeprazole    Glaucoma    Hypothyroidism    Peripheral neuropathy due to chemotherapy [facility])    Personal history of chemotherapy    Personal history of radiation therapy    Port-A-Cath in place 11/15/2022   Torn rotator cuff    RIGHT    Urinary tract infection    dx 05-26-17 took 1 week cipro  BID completed 06-02-17.   Past Surgical History:  Procedure Laterality Date   ADENOIDECTOMY     APPENDECTOMY     arthroscopic right knee      BREAST BIOPSY Right 09/01/2022   MM RT BREAST BX W LOC DEV 1ST LESION  IMAGE BX SPEC STEREO GUIDE 09/01/2022 [facility]   BREAST BIOPSY  10/01/2022   MM RT RADIOACTIVE SEED LOC MAMMO GUIDE 10/01/2022 [facility]   BREAST LUMPECTOMY     BREAST LUMPECTOMY WITH RADIOACTIVE SEED AND SENTINEL LYMPH NODE BIOPSY Right 10/05/2022   Procedure: RIGHT BREAST LUMPECTOMY WITH RADIOACTIVE SEED AND AXILLARY SENTINEL LYMPH NODE BIOPSY;  Surgeon: Ebbie Cough, MD;  Location: [facility];  Service: General;  Laterality: Right;   CHOLECYSTECTOMY     COLONOSCOPY  06/06/2017   Pyrtle   Pelvic sling     x2   POLYPECTOMY     PORT A CATH REVISION N/A 11/22/2022   Procedure: PORT A CATH REVISION;  Surgeon: Ebbie Cough, MD;  Location: [facility] OR;  Service: General;  Laterality: N/A;   PORT-A-CATH REMOVAL Left 11/28/2023   Procedure: REMOVAL PORT-A-CATH;  Surgeon: Ebbie Cough, MD;  Location: [facility];  Service: General;  Laterality: Left;   PORTACATH PLACEMENT Left 10/05/2022   Procedure: INSERTION PORT-A-CATH;  Surgeon: Ebbie Cough, MD;  Location: [facility];  Service: General;  Laterality: Left;   ROTATOR CUFF REPAIR Right    TONSILLECTOMY     TOTAL KNEE ARTHROPLASTY Right 08/12/2017   Procedure: RIGHT TOTAL  KNEE ARTHROPLASTY;  Surgeon: Gerome Charleston, MD;  Location: [facility];  Service: Orthopedics;  Laterality: Right;   TOTAL KNEE ARTHROPLASTY Left 01/06/2018   Procedure: LEFT TOTAL KNEE ARTHROPLASTY;  Surgeon: Gerome Charleston, MD;  Location: [facility];  Service: Orthopedics;  Laterality: Left;   UPPER GASTROINTESTINAL ENDOSCOPY     WISDOM TOOTH EXTRACTION     Patient Active Problem List   Diagnosis Date Noted   Abnormal CT of the chest 04/19/2024   Allergic rhinitis 01/18/2024   Asthma 01/12/2024   Suspected sleep apnea 01/12/2024   Sensorineural hearing loss, bilateral 10/11/2023   Cognitive changes 05/02/2023   Genetic testing 10/04/2022   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   Malignant  neoplasm of upper-outer quadrant of right breast in female, estrogen receptor positive (HCC) 09/10/2022   Rash and other nonspecific skin eruption 09/15/2020   Allergic contact dermatitis 09/15/2020   Pruritus 08/14/2020   Dizziness 05/10/2018   Atypical chest pain 05/10/2018   Primary osteoarthritis of left knee 01/06/2018   S/P knee replacement 08/12/2017   Incomplete tear of right rotator cuff 05/19/2017   Osteopenia 05/19/2017   Urge incontinence of urine 05/19/2017   Primary open angle glaucoma (POAG) 12/30/2016   Primary osteoarthritis of both knees 12/30/2016   Recurrent oral herpes simplex infection 12/30/2016   Tubular adenoma of colon 12/30/2016   Hypothyroidism 06/25/2016   Right foot pain 12/19/2014   Anxiety 05/21/2011   Seasonal allergies 05/21/2011    PCP: Medicine, [facility] REFERRING PROVIDER: Karis Clunes, MD   REFERRING DIAG: R42 (ICD-10-CM) - Dizziness   THERAPY DIAG:  Dizziness and giddiness  Unsteadiness on feet  Other abnormalities of gait and mobility  Abnormal posture  ONSET DATE: 02/15/2024  Rationale for Evaluation and Treatment: Rehabilitation  SUBJECTIVE:   SUBJECTIVE STATEMENT: Bent over the other day and felt something pop in the right rib and sore Pt accompanied by: self  PERTINENT HISTORY: breast cancer,hospitalization for bilateral pulmonary infiltrates with dyspnea, CHF, other PMH above  PAIN:  Are you having pain? No  PRECAUTIONS: Fall  RED FLAGS: None   WEIGHT BEARING RESTRICTIONS: No  FALLS: Has patient fallen in last 6 months? No  LIVING ENVIRONMENT: Lives with: lives alone Lives in: Other 3 story condo-stairs/elevator to 2nd floor Stairs: and elevator Has following equipment at home: Single point cane and Environmental consultant - 4 wheeled  PLOF: Independent, Independent with household mobility without device, Independent with community mobility with device, and Independent with community mobility without  device  PATIENT GOALS: To work on the balance  OBJECTIVE:   TODAY'S TREATMENT: 04/26/24 Activity Comments  POC performance and review See LTG                    [facility] PT Assessment - 04/26/24 0001       Functional Gait  Assessment   Gait assessed  Yes    Gait Level Surface Walks 20 ft in less than 7 sec but greater than 5.5 sec, uses assistive device, slower speed, mild gait deviations, or deviates 6-10 in outside of the 12 in walkway width.    Change in Gait Speed Able to smoothly change walking speed without loss of balance or gait deviation. Deviate no more than 6 in outside of the 12 in walkway width.    Gait with Horizontal Head Turns Performs head turns smoothly with no change in gait. Deviates no more than 6 in outside 12 in walkway width    Gait with  Vertical Head Turns Performs head turns with no change in gait. Deviates no more than 6 in outside 12 in walkway width.    Gait and Pivot Turn Pivot turns safely within 3 sec and stops quickly with no loss of balance.    Step Over Obstacle Is able to step over 2 stacked shoe boxes taped together (9 in total height) without changing gait speed. No evidence of imbalance.    Gait with Narrow Base of Support Ambulates 7-9 steps.    Gait with Eyes Closed Walks 20 ft, slow speed, abnormal gait pattern, evidence for imbalance, deviates 10-15 in outside 12 in walkway width. Requires more than 9 sec to ambulate 20 ft.    Ambulating Backwards Walks 20 ft, uses assistive device, slower speed, mild gait deviations, deviates 6-10 in outside 12 in walkway width.    Steps Alternating feet, no rail.    Total Score 25          TODAY'S TREATMENT: 04/17/2024 Activity Comments  Standing on folded yoga mat:  -Romberg EO/EC head turns/nods -Partial tandem stance EO head turns/nods, EC x 10 sec Light UE support  SLS: -Step taps to 6 step, 10 reps -Step tap/hover over 6 step, 5 reps SLS with foot propped on step, 5 reps, 10-15 sec Light UE  support  Forward/back step over obstacles x 10 UE support and cues for increased step length  Side step over obstacles x 10 UE support and cues for increased step length  Forward/back walking x 2 minutes Narrowed BOS  Braiding 15 ft x 2, R and L Pt with difficulty coordinated back step          PATIENT EDUCATION: Education details: Try to use yoga mat for compliant surface balance work at home Person educated: Patient Education method: Programmer, multimedia, Manufacturing engineer Education comprehension: verbalized/return demo understanding  HOME EXERCISE PROGRAM: Access Code: [email or web address] URL: [email or web address] Date: 04/10/2024 Prepared by: Gellen April Earnie Starring  Exercises - Supine Bridge with Resistance Band  - 2-3 x weekly - 1-2 sets - 10 reps - Bridge with Ankle Dorsiflexion  - 2-3 x weekly - 1-3 sets - 10 reps - Clamshell with Resistance  - 2-3 x weekly - 2-3 sets - 10 reps - Hip Hiking on Step  - 2-3 x weekly - 3 sets - 10 reps - Sidelying Hip Abduction  - 1 x daily - 7 x weekly - 3 sets - 10 reps - Romberg Stance on Foam Pad with Head Rotation  - 1 x daily - 7 x weekly - 2 sets - 10 reps - Romberg Stance with Head Nods on Foam Pad  - 1 x daily - 7 x weekly - 2 sets - 10 reps - Standing Gaze Stabilization with Head Rotation  - 1 x daily - 7 x weekly - 2 sets - 10 reps - Standing Gaze Stabilization with Head Nod  - 1 x daily - 7 x weekly - 2 sets - 10 reps     Note: Objective measures were completed at Evaluation unless otherwise noted.  DIAGNOSTIC FINDINGS: NA for this episode  COGNITION: Overall cognitive status: Within functional limits for tasks assessed and Reports changes in cognitive function   SENSATION: Not tested  POSTURE:  rounded shoulders and L pelvis/trunk posteriorly rotated  LOWER EXTREMITY MMT: NT at eval  MMT Right eval Left eval R/L 04/03/24  Hip flexion     Hip abduction 3- 3- 3+/3  Hip adduction     Hip extension 3+ 3+  Hip internal  rotation 4- 4-   Hip external rotation 3 3   Knee flexion 4 4   Knee extension 4+ 4+   Ankle dorsiflexion     Ankle plantarflexion     Ankle inversion     Ankle eversion     (Blank rows = not tested)  TRANSFERS: Assistive device utilized: None  Sit to stand: Modified independence Stand to sit: Modified independence  GAIT: Gait pattern: LLE tends to cross midline, decreased arm swing, step through pattern, decreased step length- Right, decreased step length- Left, scissoring, and narrow BOS Distance walked: 50 Assistive device utilized: None Level of assistance: SBA Comments: Slowed, guarded gait pattern without device  PATIENT SURVEYS:  DHI 20  VESTIBULAR ASSESSMENT:  GENERAL OBSERVATION: No acute distress, but pt does seem SOB.  Vitals:  85%>92-93%, HR 80 bpm   SYMPTOM BEHAVIOR:  Subjective history: Reports dizziness is off balance  Non-Vestibular symptoms: changes in hearing and balance  Type of dizziness: Imbalance (Disequilibrium) and Unsteady with head/body turns  Frequency: daily  Duration: varies  Aggravating factors: Induced by motion: occur when walking, looking up at the ceiling, bending down to the ground, turning body quickly, and turning head quickly  Relieving factors: slow movements  Progression of symptoms: unchanged  OCULOMOTOR EXAM:  Ocular Alignment: normal  Ocular ROM: No Limitations  Spontaneous Nystagmus: absent  Gaze-Induced Nystagmus: absent  Smooth Pursuits: intact  Saccades: intact  Convergence/Divergence: 6 cm    VESTIBULAR - OCULAR REFLEX:   Slow VOR: Comment: unsettled feeling with horizontal 5/10; same unsettled/off balance feeling 4/10   VOR Cancellation: Normal  Head-Impulse Test: HIT Right: negative HIT Left: positive  Dynamic Visual Acuity: NT     MOTION SENSITIVITY:  Motion Sensitivity Quotient Intensity: 0 = none, 1 = Lightheaded, 2 = Mild, 3 = Moderate, 4 = Severe, 5 = Vomiting  Intensity  1. Sitting to supine    2. Supine to L side   3. Supine to R side   4. Supine to sitting   5. L Hallpike-Dix   6. Up from L    7. R Hallpike-Dix   8. Up from R    9. Sitting, head tipped to L knee   10. Head up from L knee   11. Sitting, head tipped to R knee   12. Head up from R knee   13. Sitting head turns x5   14.Sitting head nods x5   15. In stance, 180 turn to L    16. In stance, 180 turn to R     OTHOSTATICS: not done  FUNCTIONAL GAIT: 10 meter walk test: 16.68 sec = 1.95 ft/sec   M-CTSIB  Condition 1: Firm Surface, EO 30 Sec, Mild Sway  Condition 2: Firm Surface, EC 19.97 sec Sec, Severe and unable to maintain Sway  Condition 3: Foam Surface, EO 30 Sec, Mild Sway  Condition 4: Foam Surface, EC 3.34 Sec, Severe Sway  O2 sats at end of this test:  87%, HR 102 bpm  TREATMENT DATE: 03/01/2024     GOALS: Goals reviewed with patient? Yes  SHORT TERM GOALS: Target date: 03/30/2024  Pt will be independent with HEP for improved balance, gait. Baseline: Goal status: MET  LONG TERM GOALS: Target date: 04/27/2024  Pt will be independent with HEP for improved balance, dizziness, gait. Baseline:  Goal status: MET  2.  FGA to be assessed and score to improve to at least 25/30 for decreased fall risk. Baseline: 23/30; 25/30 Goal status: MET  3.  Gait velocity to improve to at least 2.3 ft/sec for improved gait efficiency and safety. Baseline: 1.7 ft/sec; 2.7 ft/sec Goal status: MET  4.  MCSTIB Condition 2 and 4 to improve to 30 sec with mod or better sway for improved balance. Baseline: condition 2: normal, condition 4: 4 sec retro-LOB Goal status: NOT MET  5.  Pt will verbalize understanding of fall prevention in home environment.  Baseline: verbalizes good compensatory or adaptive strategies Goal status: MET  6.  DHI to improve to less than or equal to 10, for  improved balance, decreased dizziness. Baseline: 20; 16 Goal status: NOT MET  ASSESSMENT:  CLINICAL IMPRESSION: POC performance and review w/ improved score to FGA from 23 to 25/30 indicating low risk for falls. Difficulty with multisensory static balance conditions w/ LOB under condition 4 M-CTSIB. Significant improved gait speed from baseline. Minimal change reported per DHI.  Pt reports improved status noting reduced instances of LOB and no falls experienced since beginning POC.  Pt independent in HEP and confident for self-mgmt at this time.   OBJECTIVE IMPAIRMENTS: Abnormal gait, decreased balance, decreased mobility, difficulty walking, dizziness, and postural dysfunction.   ACTIVITY LIMITATIONS: standing, transfers, and locomotion level  PARTICIPATION LIMITATIONS: shopping, community activity, and travel  PERSONAL FACTORS: 3+ comorbidities: see above are also affecting patient's functional outcome.   REHAB POTENTIAL: Good  CLINICAL DECISION MAKING: Evolving/moderate complexity  EVALUATION COMPLEXITY: Moderate   PLAN:  PT FREQUENCY: 2x/week  PT DURATION: 8 weeks  PLANNED INTERVENTIONS: 97750- Physical Performance Testing, 97110-Therapeutic exercises, 97530- Therapeutic activity, 97112- Neuromuscular re-education, 97535- Self Care, [id number]- Manual therapy, [phone]- Gait training, Patient/Family education, Balance training, and Vestibular training  PLAN FOR NEXT SESSION: D/C to HEP   Jonette MARLA Sandifer, PT, DPT 04/26/2024, 10:26 AM   [facility] at [facility] [address] [place], [place] [address] Phone # [phone] Fax # [phone]

## 2024-05-02 ENCOUNTER — Inpatient Hospital Stay: Payer: Medicare Other | Attending: Hematology and Oncology | Admitting: Hematology and Oncology

## 2024-05-02 ENCOUNTER — Encounter: Payer: Self-pay | Admitting: Hematology and Oncology

## 2024-05-02 VITALS — BP 138/68 | HR 96 | Temp 98.3°F | Resp 19 | Wt 173.1 lb

## 2024-05-02 DIAGNOSIS — R2689 Other abnormalities of gait and mobility: Secondary | ICD-10-CM | POA: Diagnosis not present

## 2024-05-02 DIAGNOSIS — Z974 Presence of external hearing-aid: Secondary | ICD-10-CM | POA: Diagnosis not present

## 2024-05-02 DIAGNOSIS — Z170 Estrogen receptor positive status [ER+]: Secondary | ICD-10-CM | POA: Insufficient documentation

## 2024-05-02 DIAGNOSIS — Z801 Family history of malignant neoplasm of trachea, bronchus and lung: Secondary | ICD-10-CM | POA: Diagnosis not present

## 2024-05-02 DIAGNOSIS — Z8049 Family history of malignant neoplasm of other genital organs: Secondary | ICD-10-CM | POA: Diagnosis not present

## 2024-05-02 DIAGNOSIS — Z9221 Personal history of antineoplastic chemotherapy: Secondary | ICD-10-CM | POA: Insufficient documentation

## 2024-05-02 DIAGNOSIS — Z1589 Genetic susceptibility to other disease: Secondary | ICD-10-CM | POA: Diagnosis not present

## 2024-05-02 DIAGNOSIS — Z87891 Personal history of nicotine dependence: Secondary | ICD-10-CM | POA: Insufficient documentation

## 2024-05-02 DIAGNOSIS — R4189 Other symptoms and signs involving cognitive functions and awareness: Secondary | ICD-10-CM | POA: Diagnosis not present

## 2024-05-02 DIAGNOSIS — R413 Other amnesia: Secondary | ICD-10-CM | POA: Diagnosis not present

## 2024-05-02 DIAGNOSIS — C50411 Malignant neoplasm of upper-outer quadrant of right female breast: Secondary | ICD-10-CM | POA: Insufficient documentation

## 2024-05-02 DIAGNOSIS — Z79811 Long term (current) use of aromatase inhibitors: Secondary | ICD-10-CM | POA: Diagnosis not present

## 2024-05-02 DIAGNOSIS — R635 Abnormal weight gain: Secondary | ICD-10-CM | POA: Insufficient documentation

## 2024-05-02 DIAGNOSIS — Z923 Personal history of irradiation: Secondary | ICD-10-CM | POA: Diagnosis not present

## 2024-05-02 DIAGNOSIS — Z1732 Human epidermal growth factor receptor 2 negative status: Secondary | ICD-10-CM | POA: Diagnosis not present

## 2024-05-02 DIAGNOSIS — Z1509 Genetic susceptibility to other malignant neoplasm: Secondary | ICD-10-CM

## 2024-05-02 DIAGNOSIS — H269 Unspecified cataract: Secondary | ICD-10-CM | POA: Insufficient documentation

## 2024-05-02 DIAGNOSIS — Z1721 Progesterone receptor positive status: Secondary | ICD-10-CM | POA: Diagnosis not present

## 2024-05-02 DIAGNOSIS — Z1501 Genetic susceptibility to malignant neoplasm of breast: Secondary | ICD-10-CM

## 2024-05-02 NOTE — Progress Notes (Signed)
 BRIEF ONCOLOGIC HISTORY:  Oncology History  Malignant neoplasm of upper-outer quadrant of right breast in female, estrogen receptor positive (HCC)  08/02/2022 Mammogram   In the right breast, possible distortion warrants further evaluation. In the left breast, no findings suspicious for malignancy. Diagnostic mammogram showed persistent subtle distortion central slightly LATERAL aspect of the RIGHT breast warranting tissue diagnosis.     09/01/2022 Pathology Results   Final pathology showed grade 2 invasive mammary carcinoma, prognostic showed ER 90% positive strong staining PR 90% positive strong staining, Ki-67 of 5% and HER2 3+ by IHC   09/10/2022 Initial Diagnosis   Malignant neoplasm of upper-outer quadrant of right breast in female, estrogen receptor positive (HCC)    Genetic Testing   [facility] CancerNext-Expanded Panel+RNA was Positive. 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 Report date is 10/04/2022.  The CancerNext-Expanded gene panel offered by [facility] and includes sequencing, rearrangement, and RNA analysis for the following 77 genes: AIP, ALK, APC, ATM, AXIN2, BAP1, BARD1, BLM, BMPR1A, BRCA1, BRCA2, BRIP1, CDC73, CDH1, CDK4, CDKN1B, CDKN2A, CHEK2, CTNNA1, DICER1, FANCC, FH, FLCN, GALNT12, KIF1B, LZTR1, MAX, MEN1, MET, MLH1, MSH2, MSH3, MSH6, MUTYH, NBN, NF1, NF2, NTHL1, PALB2, PHOX2B, PMS2, POT1, PRKAR1A, PTCH1, PTEN, RAD51C, RAD51D, RB1, RECQL, RET, SDHA, SDHAF2, SDHB, SDHC, SDHD, SMAD4, SMARCA4, SMARCB1, SMARCE1, STK11, SUFU, TMEM127, TP53, TSC1, TSC2, VHL and XRCC2 (sequencing and deletion/duplication); EGFR, EGLN1, HOXB13, KIT, MITF, PDGFRA, POLD1, and POLE (sequencing only); EPCAM and GREM1 (deletion/duplication only).    10/05/2022 Surgery   Right breast lumpectomy with Dr. 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  Grade 2, 2 sentinel  lymph nodes negative for metastasis.  Margins negative.  T1c, N0.   10/05/2022 Cancer Staging   Staging form: Breast, AJCC 8th Edition - Pathologic stage from 10/05/2022: Stage IA (pT1c, pN0, cM0, G2, ER+, PR+, HER2+) - Signed by Crawford [name] Pickle, NP on 11/08/2022 Stage prefix: Initial diagnosis Histologic grading system: 3 grade system   11/08/2022 - 11/01/2023 Chemotherapy   Patient is on Treatment Plan : BREAST Paclitaxel  + Trastuzumab  q7d / Trastuzumab  q21d     03/23/2023 - 04/19/2023 Radiation Therapy   Plan Name: Breast_R Site: Breast, Right Technique: 3D Mode: Photon Dose Per Fraction: 2.66 Gy Prescribed Dose (Delivered / Prescribed): 42.56 Gy / 42.56 Gy Prescribed Fxs (Delivered / Prescribed): 16 / 16   Plan Name: Breast_R_Bst Site: Breast, Right Technique: 3D Mode: Photon Dose Per Fraction: 2 Gy Prescribed Dose (Delivered / Prescribed): 8 Gy / 8 Gy Prescribed Fxs (Delivered / Prescribed): 4 / 4   05/2023 -  Anti-estrogen oral therapy   Anastrozole      INTERVAL HISTORY:   Discussed the use of AI scribe software for clinical note transcription with the patient, who gave verbal consent to proceed.  History of Present Illness Judy Morrison is a 73 year old female who presents with memory concerns and breathlessness.  She experiences ongoing memory issues, described as 'easily fuzzy' and having 'chunks of time she doesn't remember.' A neurologist has evaluated her, and a brain scan showed no alarming findings. She is scheduled for a comprehensive evaluation tomorrow, which is expected to be lengthy. Her memory issues cause anxiety, which she believes may contribute to her breathlessness.  She experiences breathlessness, which has been evaluated by pulmonology. There is consideration that it might be related to radiation or asthma. She uses Symbicort  twice a day and has  a rescue inhaler for emergencies. Initially, she struggled with using the inhaler correctly but  now has an additional device to assist her.  She has a history of breast cancer and is currently taking anastrozole  without any reported issues.  She continues to have regular mammograms and MRIs.  She reports a recent right rib pain after bending awkwardly, which is slowly improving. She also expresses frustration with her weight, noting a change in how her clothes fit.  She describes a sensation of wobbliness, not dizziness, which affects her balance. She uses a cane and a walker, especially when traveling. She has been attending neuro physical therapy and feels improved, with exercises to continue at home. She wears hearing aids and has cataracts, which she plans to address after Christmas.  She experiences anxiety, particularly related to flying and her memory issues. She does not currently take medication for anxiety but acknowledges that certain situations exacerbate her symptoms.  Rest of the pertinent 10 point ROS reviewed and neg.    PAST MEDICAL/SURGICAL HISTORY:  Past Medical History:  Diagnosis Date   Anemia    Anxiety    Arthritis    Asthma 01/12/2024   Breast cancer (HCC) 2024   right breast IDC   Depressed    Dermatitis    rare form, on prednisone   Dyspnea    with exertion mostly   GERD (gastroesophageal reflux disease)    controlled with Omeprazole    Glaucoma    Hypothyroidism    Peripheral neuropathy due to chemotherapy [facility])    Personal history of chemotherapy    Personal history of radiation therapy    Port-A-Cath in place 11/15/2022   Torn rotator cuff    RIGHT    Urinary tract infection    dx 05-26-17 took 1 week cipro  BID completed 06-02-17.   Past Surgical History:  Procedure Laterality Date   ADENOIDECTOMY     APPENDECTOMY     arthroscopic right knee      BREAST BIOPSY Right 09/01/2022   MM RT BREAST BX W LOC DEV 1ST LESION IMAGE BX SPEC STEREO GUIDE 09/01/2022 [facility]   BREAST BIOPSY  10/01/2022   MM RT RADIOACTIVE SEED LOC MAMMO GUIDE  10/01/2022 [facility]   BREAST LUMPECTOMY     BREAST LUMPECTOMY WITH RADIOACTIVE SEED AND SENTINEL LYMPH NODE BIOPSY Right 10/05/2022   Procedure: RIGHT BREAST LUMPECTOMY WITH RADIOACTIVE SEED AND AXILLARY SENTINEL LYMPH NODE BIOPSY;  Surgeon: Ebbie Cough, MD;  Location: [facility];  Service: General;  Laterality: Right;   CHOLECYSTECTOMY     COLONOSCOPY  06/06/2017   Pyrtle   Pelvic sling     x2   POLYPECTOMY     PORT A CATH REVISION N/A 11/22/2022   Procedure: PORT A CATH REVISION;  Surgeon: Ebbie Cough, MD;  Location: [facility];  Service: General;  Laterality: N/A;   PORT-A-CATH REMOVAL Left 11/28/2023   Procedure: REMOVAL PORT-A-CATH;  Surgeon: Ebbie Cough, MD;  Location: [facility];  Service: General;  Laterality: Left;   PORTACATH PLACEMENT Left 10/05/2022   Procedure: INSERTION PORT-A-CATH;  Surgeon: Ebbie Cough, MD;  Location: [facility];  Service: General;  Laterality: Left;   ROTATOR CUFF REPAIR Right    TONSILLECTOMY     TOTAL KNEE ARTHROPLASTY Right 08/12/2017   Procedure: RIGHT TOTAL KNEE ARTHROPLASTY;  Surgeon: Gerome Charleston, MD;  Location: [facility];  Service: Orthopedics;  Laterality: Right;   TOTAL KNEE ARTHROPLASTY Left 01/06/2018   Procedure: LEFT TOTAL  KNEE ARTHROPLASTY;  Surgeon: Gerome Charleston, MD;  Location: [facility];  Service: Orthopedics;  Laterality: Left;   UPPER GASTROINTESTINAL ENDOSCOPY     WISDOM TOOTH EXTRACTION       ALLERGIES:  Allergies  Allergen Reactions   Penicillin G Anaphylaxis   Cephalexin Other (See Comments)    Pt does not remember reaction    Codeine Nausea Only   Erythromycin Itching     CURRENT MEDICATIONS:  Outpatient Encounter Medications as of 05/02/2024  Medication Sig   Adapalene 0.3 % gel Apply 1 application daily as needed topically (for acne).   ALPRAZolam (XANAX) 0.25 MG tablet Take 0.25 mg by mouth daily as needed for anxiety.     amphetamine-dextroamphetamine (ADDERALL) 10 MG tablet Take 10 mg by mouth daily with breakfast.   anastrozole  (ARIMIDEX ) 1 MG tablet TAKE 1 TABLET BY MOUTH EVERY DAY   brexpiprazole  (REXULTI ) 2 MG TABS tablet Take 2 mg by mouth daily after breakfast.   budesonide -formoterol  (SYMBICORT ) 80-4.5 MCG/ACT inhaler Inhale 2 puffs into the lungs in the morning and at bedtime.   carvedilol  (COREG ) 3.125 MG tablet TAKE 1 TABLET BY MOUTH 2 TIMES DAILY.   clobetasol cream (TEMOVATE) 0.05 % Apply 1 Application topically 2 (two) times daily as needed (irritation).   clonazePAM  (KLONOPIN ) 0.5 MG tablet Take 0.5 mg by mouth 2 (two) times daily.   clotrimazole (MYCELEX) 10 MG troche Take 10 mg by mouth as directed. 2 times per week   DULoxetine  (CYMBALTA ) 20 MG capsule Take 20 mg by mouth daily.   ferrous sulfate  325 (65 FE) MG tablet Take 325 mg by mouth every other day.   furosemide  (LASIX ) 20 MG tablet TAKE 1 TABLET BY MOUTH AS NEEDED   hydrOXYzine (VISTARIL) 25 MG capsule Take 25-50 mg by mouth daily.   latanoprost  (XALATAN ) 0.005 % ophthalmic solution Place 1 drop into both eyes at bedtime.   levothyroxine  (SYNTHROID , LEVOTHROID) 50 MCG tablet Take 50 mcg at bedtime by mouth.    loratadine  (CLARITIN ) 10 MG tablet Take 10 mg by mouth daily.   losartan  (COZAAR ) 25 MG tablet Take 1 tablet (25 mg total) by mouth at bedtime.   meloxicam (MOBIC) 7.5 MG tablet Take 7.5 mg by mouth daily.   nortriptyline  (PAMELOR ) 50 MG capsule Take 100 mg at bedtime by mouth.    omeprazole  (PRILOSEC) 40 MG capsule Take 1 capsule (40 mg total) by mouth daily before breakfast.   OVER THE COUNTER MEDICATION 20 mLs by Mouth Rinse route 4 (four) times daily as needed (mouth pain). Oral-B Sore Mouth Rinse   phenazopyridine (PYRIDIUM) 200 MG tablet Take 200 mg by mouth 3 (three) times daily as needed.   simvastatin (ZOCOR) 10 MG tablet Take 10 mg by mouth daily.   tacrolimus (PROGRAF) 1 MG capsule Take 1 mg by mouth 2 (two) times daily.  Per patient - Dissolve capsule in one half liter of H2O. Swish and spit twice daily   triamcinolone cream (KENALOG) 0.1 % Apply 1 Application topically 2 (two) times daily as needed (irritation).   valACYclovir (VALTREX) 1000 MG tablet Take 1,000 mg by mouth daily as needed (outbreaks).   Facility-Administered Encounter Medications as of 05/02/2024  Medication   sodium chloride  flush (NS) 0.9 % injection 10 mL     ONCOLOGIC FAMILY HISTORY:  Family History  Problem Relation Age of Onset   Bowel Disease Mother        ishemic bowel   Lung cancer Mother  smoked   Cervical cancer Paternal Aunt    Cancer Maternal Grandfather        unknown type   Lung cancer Paternal Grandfather        smoked   Colon cancer Neg Hx    Breast cancer Neg Hx    Esophageal cancer Neg Hx    Pancreatic cancer Neg Hx    Prostate cancer Neg Hx    Rectal cancer Neg Hx    Stomach cancer Neg Hx    Allergic rhinitis Neg Hx    Asthma Neg Hx    Eczema Neg Hx    Urticaria Neg Hx    Colon polyps Neg Hx      SOCIAL HISTORY:  Social History   Socioeconomic History   Marital status: Single    Spouse name: Not on file   Number of children: Not on file   Years of education: Not on file   Highest education level: Not on file  Occupational History   Not on file  Tobacco Use   Smoking status: Former    Current packs/day: 0.00    Types: Cigarettes    Quit date: 58    Years since quitting: 39.6   Smokeless tobacco: Never  Vaping Use   Vaping status: Never Used  Substance and Sexual Activity   Alcohol use: Yes    Alcohol/week: 2.0 standard drinks of alcohol    Types: 2 Cans of beer per week    Comment: social   Drug use: No   Sexual activity: Not Currently    Birth control/protection: Post-menopausal  Other Topics Concern   Not on file  Social History Narrative   Are you right handed or left handed? Right   Are you currently employed ?    What is your current occupation? retired   Do you  live at home alone?   Who lives with you? yes   What type of home do you live in: 1 story or 2 story? one    Caffeine 2 cups daily   Social Drivers of Health   Financial Resource Strain: Low Risk  (04/13/2024)   Received from [facility]   Overall Financial Resource Strain (CARDIA)    How hard is it for you to pay for the very basics like food, housing, medical care, and heating?: Not hard at all  Food Insecurity: No Food Insecurity (04/13/2024)   Received from [facility]   Hunger Vital Sign    Within the past 12 months, you worried that your food would run out before you got the money to buy more.: Never true    Within the past 12 months, the food you bought just didn't last and you didn't have money to get more.: Never true  Transportation Needs: No Transportation Needs (04/13/2024)   Received from [facility] - Transportation    In the past 12 months, has lack of transportation kept you from medical appointments or from getting medications?: No    In the past 12 months, has lack of transportation kept you from meetings, work, or from getting things needed for daily living?: No  Physical Activity: Insufficiently Active (04/13/2024)   Received from [facility]   Exercise Vital Sign    On average, how many days per week do you engage in moderate to strenuous exercise (like a brisk walk)?: 3 days    On average, how many minutes do you engage in exercise at this level?: 40 min  Stress:  No Stress Concern Present (04/13/2024)   Received from [facility] - Occupational Stress Questionnaire    Do you feel stress - tense, restless, nervous, or anxious, or unable to sleep at night because your mind is troubled all the time - these days?: Not at all  Social Connections: Moderately Integrated (04/13/2024)   Received from [facility]   Social Network    How would you rate your social network (family, work, friends)?: Adequate participation  with social networks  Intimate Partner Violence: Not At Risk (04/13/2024)   Received from [facility]   HITS    Over the last 12 months how often did your partner physically hurt you?: Never    Over the last 12 months how often did your partner insult you or talk down to you?: Never    Over the last 12 months how often did your partner threaten you with physical harm?: Never    Over the last 12 months how often did your partner scream or curse at you?: Never     OBSERVATIONS/OBJECTIVE:  BP 138/68 (BP Location: Left Arm, Patient Position: Sitting, Cuff Size: Normal)   Pulse 96   Temp 98.3 F (36.8 C) (Oral)   Resp 19   Wt 173 lb 1.6 oz (78.5 kg)   SpO2 97%   BMI 30.66 kg/m   GENERAL: Patient is a well appearing female in no acute distress HEENT:  Sclerae anicteric.  Oropharynx clear and moist. No ulcerations or evidence of oropharyngeal candidiasis. Neck is supple.  NODES:  No cervical, supraclavicular, or axillary lymphadenopathy palpated.  BREAST EXAM:  Deferred. LUNGS:  Clear to auscultation bilaterally.  No wheezes or rhonchi. HEART:  Regular rate and rhythm. No murmur appreciated. ABDOMEN:  Soft, nontender.  Positive, normoactive bowel sounds. No organomegaly palpated. MSK:  No focal spinal tenderness to palpation. Full range of motion bilaterally in the upper extremities. EXTREMITIES:  No peripheral edema.   SKIN:  Clear with no obvious rashes or skin changes. No nail dyscrasia. NEURO:  Nonfocal. Well oriented.  Appropriate affect.   LABORATORY DATA:  None for this visit.  DIAGNOSTIC IMAGING:  None for this visit.      ASSESSMENT AND PLAN:  Ms.. Dillavou is a pleasant 73 y.o. female with Stage IA right breast invasive ductal carcinoma, ER+/PR+/HER2+, diagnosed in 08/2022, treated with lumpectomy, adjuvant chemotherapy, maintenance herceptin , adjuvant radiation therapy, and anti-estrogen therapy with Anastrozole  beginning in 05/2023.    Assessment and Plan Assessment  & Plan Cognitive impairment with memory loss Ongoing cognitive impairment with memory loss. Neurology evaluation ongoing to assess for dementia or treatment-accelerated memory loss. Recent brain scan unremarkable.  - Attend neurology evaluation tomorrow for further assessment.  Personal history of breast cancer, status post treatment with ongoing surveillance and anastrozole  therapy Breast cancer under anastrozole  therapy with no current issues. Surveillance with mammograms and MRIs ongoing. Recent imaging unremarkable. - Continue anastrozole  therapy. - Order mammogram for December. - Ensure MRI is scheduled for September.  Balance impairment Balance impairment without dizziness. Possible causes include joint position sense, vision, or hearing impairment. No neuropathy or brain tumor. Uses cane and walker. - Use cane or walker for support. - Continue home exercises provided by neuro physical therapist.  Cataracts Cataracts present with good vision. Surgery consideration post-Christmas.  Hearing loss with hearing aids Hearing loss managed with hearing aids. Difficulty in noisy environments reported.  Weight gain Weight gain with possible abdominal fluid retention. Dietary changes implemented. Medical  weight loss clinic considered post-neurology evaluation. - Consider medical weight loss clinic referral after neurology evaluation.  Time spent: 40 min  *Total Encounter Time as defined by the Centers for Medicare and Medicaid Services includes, in addition to the face-to-face time of a patient visit (documented in the note above) non-face-to-face time: obtaining and reviewing outside history, ordering and reviewing medications, tests or procedures, care coordination (communications with other health care professionals or caregivers) and documentation in the medical record.

## 2024-05-03 ENCOUNTER — Ambulatory Visit: Payer: Self-pay

## 2024-05-03 ENCOUNTER — Encounter: Payer: Self-pay | Admitting: Psychology

## 2024-05-03 ENCOUNTER — Ambulatory Visit (INDEPENDENT_AMBULATORY_CARE_PROVIDER_SITE_OTHER): Admitting: Psychology

## 2024-05-03 DIAGNOSIS — B079 Viral wart, unspecified: Secondary | ICD-10-CM | POA: Insufficient documentation

## 2024-05-03 DIAGNOSIS — D225 Melanocytic nevi of trunk: Secondary | ICD-10-CM | POA: Insufficient documentation

## 2024-05-03 DIAGNOSIS — R4189 Other symptoms and signs involving cognitive functions and awareness: Secondary | ICD-10-CM

## 2024-05-03 DIAGNOSIS — F329 Major depressive disorder, single episode, unspecified: Secondary | ICD-10-CM | POA: Insufficient documentation

## 2024-05-03 DIAGNOSIS — F0670 Mild neurocognitive disorder due to known physiological condition without behavioral disturbance: Secondary | ICD-10-CM | POA: Diagnosis not present

## 2024-05-03 NOTE — Progress Notes (Signed)
 NEUROPSYCHOLOGICAL EVALUATION [place] Department of Neurology  Date of Evaluation: May 03, 2024  Reason for Referral:   BERNESE DOFFING is a 73 y.o. right-handed Caucasian female referred by Camie Sevin, PA-C, to characterize her current cognitive functioning and assist with diagnostic clarity and treatment planning in the context of subjective cognitive decline.   Assessment and Plan:   Clinical Impression(s): Ms. Attridge's pattern of performance is suggestive of isolated impairments surrounding verbal fluency (both phonemic and semantic), as well as both delayed retrieval and recognition/consolidation aspects of visual memory. Further performance variability was exhibited across executive functioning. Performances were appropriate relative to age-matched peers across processing speed, attention/concentration, receptive language, confrontation naming, visuospatial abilities, and all aspects of verbal learning and memory. Functionally, Ms. Heitman denied difficulties completing instrumental activities of daily living (ADLs) independently. 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  The cause for cognitive dysfunction is likely multifactorial in nature. Most recent neuroimaging (2024) has suggested moderate microvascular ischemic disease which has progressed over time since her initial brain MRI performed in 2019. Ms. Bucker is also prescribed several medications with well established cognitive side effects, namely alprazolam/Xanax and clonazepam /Klonopin . Mild cognitive side effects have also been associated with anastrozole /Arimidex . Further, Ms. Walter described a primary decline in cognitive functioning following chemotherapy treatment. Cognitive side effects from chemotherapy treatment, sometimes referred to as  chemobrain, are quite common in individuals going through active treatment. Persisting difficulties beyond treatment are also common, with some research suggesting 35% of individuals experience mild to moderate dysfunction even years following treatment completion. All of these factors, especially when combined, could reasonably create a pattern of deficits which Ms. Grabinski exhibited across the current evaluation. As such, this combination appears to be the most likely cause of both subjective and objective dysfunction at the present time.   Specific to memory, despite trouble recalling visual information, Ms. Constantine was able to learn novel verbal information efficiently and retain this knowledge after lengthy delays. Retention rates across verbal memory tasks ranged from 88% to 117% for example. Overall, especially in light of other confounding variables described above, memory performances do not offer compelling evidence to warrant advanced concerns surrounding presently symptomatic Alzheimer's disease. Continued medical monitoring will be beneficial moving forward.   Recommendations: If interested, Ms. Quintanilla could speak with her prescribing physician regarding medication adjustments to optimally manage psychiatric symptoms while minimizing her cognitive side effect profile.   Performance across neurocognitive testing is not a strong predictor of an individual's safety operating a motor vehicle. Should her family wish to pursue a formalized driving evaluation, they could reach out to the following agencies: [employer] [facility] in [place]: [phone] [facility]: [phone] [facility]: [phone] [facility]: [phone] or [phone]  Should there be progression of current deficits over time, Ms. Cosens is unlikely to regain any independent living skills lost. Therefore, it is recommended that she remain as involved as possible in all  aspects of household chores, finances, and medication management, with supervision to ensure adequate performance. she will likely benefit from the establishment and maintenance of a routine in order to maximize her functional abilities over time.  Ms. Mallinger is encouraged to attend to lifestyle factors for brain health (e.g., regular physical exercise, good nutrition habits and consideration of the MIND-DASH diet, regular participation in cognitively-stimulating activities, and general stress management techniques), which are likely to  have benefits for both emotional adjustment and cognition. In fact, in addition to promoting good general health, regular exercise incorporating aerobic activities (e.g., brisk walking, jogging, cycling, etc.) has been demonstrated to be a very effective treatment for depression and stress, with similar efficacy rates to both antidepressant medication and psychotherapy. Optimal control of vascular risk factors (including safe cardiovascular exercise and adherence to dietary recommendations) is encouraged. Continued participation in activities which provide mental stimulation and social interaction is also recommended.   Memory can be improved using internal strategies such as rehearsal, repetition, chunking, mnemonics, association, and imagery. External strategies such as written notes in a consistently used memory journal, visual and nonverbal auditory cues such as a calendar on the refrigerator or appointments with alarm, such as on a cell phone, can also help maximize recall.    When learning new information, she would benefit from information being broken up into small, manageable pieces. she may also find it helpful to articulate the material in her own words and in a context to promote encoding at the onset of a new task. This material may need to be repeated multiple times to promote encoding.  To address problems with fluctuating attention and/or executive  dysfunction, she may wish to consider:   -Avoiding external distractions when needing to concentrate   -Limiting exposure to fast paced environments with multiple sensory demands   -Writing down complicated information and using checklists   -Attempting and completing one task at a time (i.e., no multi-tasking)   -Verbalizing aloud each step of a task to maintain focus   -Taking frequent breaks during the completion of steps/tasks to avoid fatigue   -Reducing the amount of information considered at one time   -Scheduling more difficult activities for a time of day where she is usually most alert  Review of Records:   Past Medical History:  Diagnosis Date   Acquired hallux rigidus of right foot 01/25/2022   Acute on chronic respiratory failure (HCC) 12/06/2023   Allergic rhinitis    Asthma 01/12/2024   Atypical chest pain 05/10/2018   Chronic systolic heart failure 12/06/2023   Congestive heart failure 12/06/2023   Dermatitis    rare form, on prednisone   Dizziness 05/10/2018   Dyspnea    with exertion mostly   Generalized anxiety disorder 05/21/2011   Under the care of psychiatry. Takes Pamelor , Klonopin , Cymbalta , and Rexulti .     Genetic testing 10/04/2022   [facility] CancerNext-Expanded Panel+RNA was Positive. 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 Report date is 10/04/2022.     The CancerNext-Expanded gene panel offered by [employer] and includes sequencing, rearrangement, and RNA analysis for the following 77 genes: AIP, ALK, APC, ATM, AXIN2   GERD (gastroesophageal reflux disease)    controlled with Omeprazole    Hypothyroidism    Incomplete tear of right rotator cuff 05/19/2017   Overview:   saw [place] orthopedist 2016, had MRI. 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 Patrcia     Primary osteoarthritis of both knees 12/30/2016   Overview:   Under the care of [facility]opedics. Has been getting Flexogenic and cortisone Shots.     Pruritus 08/14/2020   Recurrent oral herpes simplex infection 12/30/2016   Right foot pain 12/19/2014   Overview:   Seeing Orthopedist in May 2016. Has seen Dr. Rinda and was put in a boot for bunion on this foot. Then saw PT who made an orthotic insert that helps.     S/P knee replacement 08/12/2017   Sensorineural hearing loss, bilateral 10/11/2023   Tubular adenoma of colon 12/30/2016   Overview:   On colonoscopy 2017. One year repeat recommended.    On colonoscopy 2017. Repeated 2018 and was normal. Three-year recall recommended.     Urge incontinence of urine 05/19/2017   Urinary tract infection    dx 05-26-17 took 1 week cipro  BID completed 06-02-17.   Verruca vulgaris 05/03/2024    Past Surgical History:  Procedure Laterality Date   ADENOIDECTOMY     APPENDECTOMY     arthroscopic right knee      BREAST BIOPSY Right 09/01/2022   MM RT BREAST BX W LOC DEV 1ST LESION IMAGE BX SPEC STEREO GUIDE 09/01/2022 [facility]   BREAST BIOPSY  10/01/2022   MM RT RADIOACTIVE SEED LOC MAMMO GUIDE 10/01/2022 [facility]    BREAST LUMPECTOMY     BREAST LUMPECTOMY WITH RADIOACTIVE SEED AND SENTINEL LYMPH NODE BIOPSY Right 10/05/2022   Procedure: RIGHT BREAST LUMPECTOMY WITH RADIOACTIVE SEED AND AXILLARY SENTINEL LYMPH NODE BIOPSY;  Surgeon: Ebbie Cough, MD;  Location: [facility];  Service: General;  Laterality: Right;   CHOLECYSTECTOMY     COLONOSCOPY  06/06/2017   Pyrtle   Pelvic sling     x2   POLYPECTOMY     PORT A CATH REVISION N/A 11/22/2022   Procedure: PORT A CATH REVISION;  Surgeon: Ebbie Cough, MD;  Location: [facility];  Service: General;  Laterality: N/A;   PORT-A-CATH REMOVAL Left 11/28/2023   Procedure: REMOVAL PORT-A-CATH;  Surgeon: Ebbie Cough, MD;  Location: [facility];  Service: General;  Laterality: Left;   PORTACATH PLACEMENT Left 10/05/2022   Procedure: INSERTION PORT-A-CATH;  Surgeon: Ebbie Cough, MD;  Location: [facility];  Service: General;  Laterality: Left;   ROTATOR CUFF REPAIR Right    TONSILLECTOMY     TOTAL KNEE ARTHROPLASTY Right 08/12/2017   Procedure: RIGHT TOTAL KNEE ARTHROPLASTY;  Surgeon: Gerome Charleston, MD;  Location: [facility];  Service: Orthopedics;  Laterality: Right;   TOTAL KNEE ARTHROPLASTY Left 01/06/2018   Procedure: LEFT TOTAL KNEE ARTHROPLASTY;  Surgeon: Gerome Charleston, MD;  Location: [facility];  Service: Orthopedics;  Laterality: Left;   UPPER GASTROINTESTINAL ENDOSCOPY     WISDOM TOOTH EXTRACTION      Current Outpatient Medications:    Adapalene 0.3 % gel, Apply 1 application daily as needed topically (for acne)., Disp: , Rfl:    ALPRAZolam (XANAX) 0.25 MG tablet, Take 0.25 mg by mouth daily as needed for anxiety. ,  Disp: , Rfl: 1   amphetamine-dextroamphetamine (ADDERALL) 10 MG tablet, Take 10 mg by mouth daily with breakfast., Disp: , Rfl:    anastrozole  (ARIMIDEX ) 1 MG tablet, TAKE 1 TABLET BY MOUTH EVERY DAY, Disp: 90 tablet, Rfl: 3   brexpiprazole  (REXULTI ) 2 MG TABS tablet, Take 2 mg by mouth  daily after breakfast., Disp: , Rfl:    budesonide -formoterol  (SYMBICORT ) 80-4.5 MCG/ACT inhaler, Inhale 2 puffs into the lungs in the morning and at bedtime., Disp: 1 each, Rfl: 12   carvedilol  (COREG ) 3.125 MG tablet, TAKE 1 TABLET BY MOUTH 2 TIMES DAILY., Disp: 180 tablet, Rfl: 1   clobetasol cream (TEMOVATE) 0.05 %, Apply 1 Application topically 2 (two) times daily as needed (irritation)., Disp: , Rfl:    clonazePAM  (KLONOPIN ) 0.5 MG tablet, Take 0.5 mg by mouth 2 (two) times daily., Disp: , Rfl:    clotrimazole (MYCELEX) 10 MG troche, Take 10 mg by mouth as directed. 2 times per week, Disp: , Rfl:    DULoxetine  (CYMBALTA ) 20 MG capsule, Take 20 mg by mouth daily., Disp: , Rfl:    ferrous sulfate  325 (65 FE) MG tablet, Take 325 mg by mouth every other day., Disp: , Rfl:    furosemide  (LASIX ) 20 MG tablet, TAKE 1 TABLET BY MOUTH AS NEEDED, Disp: 30 tablet, Rfl: 3   hydrOXYzine (VISTARIL) 25 MG capsule, Take 25-50 mg by mouth daily., Disp: , Rfl:    latanoprost  (XALATAN ) 0.005 % ophthalmic solution, Place 1 drop into both eyes at bedtime., Disp: , Rfl:    levothyroxine  (SYNTHROID , LEVOTHROID) 50 MCG tablet, Take 50 mcg at bedtime by mouth. , Disp: , Rfl:    loratadine  (CLARITIN ) 10 MG tablet, Take 10 mg by mouth daily., Disp: , Rfl:    losartan  (COZAAR ) 25 MG tablet, Take 1 tablet (25 mg total) by mouth at bedtime., Disp: , Rfl:    meloxicam (MOBIC) 7.5 MG tablet, Take 7.5 mg by mouth daily., Disp: , Rfl:    nortriptyline  (PAMELOR ) 50 MG capsule, Take 100 mg at bedtime by mouth. , Disp: , Rfl:    omeprazole  (PRILOSEC) 40 MG capsule, Take 1 capsule (40 mg total) by mouth daily before breakfast., Disp: 90 capsule, Rfl: 3   OVER THE COUNTER MEDICATION, 20 mLs by Mouth Rinse route 4 (four) times daily as needed (mouth pain). Oral-B Sore Mouth Rinse, Disp: , Rfl:    phenazopyridine (PYRIDIUM) 200 MG tablet, Take 200 mg by mouth 3 (three) times daily as needed., Disp: , Rfl:    simvastatin (ZOCOR) 10  MG tablet, Take 10 mg by mouth daily., Disp: , Rfl:    tacrolimus (PROGRAF) 1 MG capsule, Take 1 mg by mouth 2 (two) times daily. Per patient - Dissolve capsule in one half liter of H2O. Swish and spit twice daily, Disp: , Rfl:    triamcinolone cream (KENALOG) 0.1 %, Apply 1 Application topically 2 (two) times daily as needed (irritation)., Disp: , Rfl:    valACYclovir (VALTREX) 1000 MG tablet, Take 1,000 mg by mouth daily as needed (outbreaks)., Disp: , Rfl:  No current facility-administered medications for this visit.  Facility-Administered Medications Ordered in Other Visits:    sodium chloride  flush (NS) 0.9 % injection 10 mL, 10 mL, Intracatheter, PRN, Iruku, Praveena, MD       03/12/2024    1:00 PM  Montreal Cognitive Assessment   Visuospatial/ Executive (0/5) 4  Naming (0/3) 3  Attention: Read list of digits (0/2) 2  Attention: Read list of  letters (0/1) 1  Attention: Serial 7 subtraction starting at 100 (0/3) 3  Language: Repeat phrase (0/2) 1  Language : Fluency (0/1) 1  Abstraction (0/2) 2  Delayed Recall (0/5) 3  Orientation (0/6) 6  Total 26  Adjusted Score (based on education) 26   Neuroimaging: Brain MRI on 04/20/2018 suggested mild to moderate microvascular ischemic disease. Brain MRI on 01/29/2023 suggested moderate microvascular ischemic disease, said to have progressed relative to her 2019 scan. Brain MRI on 04/08/2024 revealed moderate microvascular ischemic disease, a T2 hyperintensity within the left thalamus thought to reflect a perivascular space, and mild age-appropriate generalized volume loss.   Clinical Interview:   The following information was obtained during a clinical interview with Ms. Michl prior to cognitive testing.  Cognitive Symptoms: Decreased short-term memory: Endorsed. She described some difficulties recalling details of recent conversations and some more generalized concerns surrounding quick forgetting of information. She acknowledged family  held concern surrounding Alzheimer's disease but did not appear to share these concerns to the same magnitude.  Decreased long-term memory: Denied. Decreased attention/concentration: Endorsed. She described trouble with sustained attention and increased distractibility. Reduced processing speed: Endorsed. She described primary difficulties feeling mentally foggy and slowed down.  Difficulties with executive functions: Endorsed. She noted primary difficulties with organization and multi-tasking. She denied trouble with impulsivity or any prominent personality changes.  Difficulties with emotion regulation: Denied. Difficulties with receptive language: Denied. Difficulties with word finding: Endorsed. Decreased visuoperceptual ability: Denied.  Trajectory of deficits: Ms. Sword was diagnosed with breast cancer in 2023. Following this diagnosis, she completed 12 weeks of chemotherapy and 20 consecutive days of radiation, followed by post-treatment medication. She reported completing treatment in February 2025. Since starting chemotherapy, she reported experiencing chemobrain symptoms, including prominently diminished process speed, attentional dysregulation, and word finding. These difficulties have persisted to present day despite an end to active treatment. Ms. Tsou did allude to the potential for some memory dysfunction to be present prior to cancer treatment. However, symptoms were not described as impairing or concerning.    Difficulties completing ADLs: Denied.  Additional Medical History: History of traumatic brain injury/concussion: Denied. History of stroke: Denied. History of seizure activity: Denied. History of known exposure to toxins: Denied. Symptoms of chronic pain: Denied. Experience of frequent headaches/migraines: Denied. Frequent instances of dizziness/vertigo: Denied.  Sensory changes: She utilizes reading glasses and hearing aids with benefit. Other sensory  changes/difficulties (e.g., taste or smell) were denied.  Balance/coordination difficulties: She described generalized unsteadiness while ambulating. One side was not said to be worse relative to the other and she denied any recent falls. She denied dizziness and was unsure of the cause of symptoms. Difficulties were present prior to chemotherapy to a mild degree but do seem to be exacerbated following treatment.  Other motor difficulties: Denied.  Sleep History: Estimated hours obtained each night: 8 hours.  Difficulties falling asleep: Denied. Difficulties staying asleep: Denied. Feels rested and refreshed upon awakening: Endorsed.  History of snoring: Denied. History of waking up gasping for air: Denied. Witnessed breath cessation while asleep: Denied.  History of vivid dreaming: Denied. Excessive movement while asleep: Denied. Instances of acting out her dreams: Denied.  Psychiatric/Behavioral Health History: Depression: When asked her current mood, she acknowledged I'm depressed. She reported a longstanding history of depressive experiences, while also acknowledging that she is on numerous medications to treat symptoms. Medications were said to be helpful. Current or remote suicidal ideation, intent, or plan was denied.  Anxiety: She reported feeling anxious and  I don't even know why. Symptoms appear generalized in nature. There have been concerns surrounding prior panic experiences per medical records. Medications were said to be helpful with symptom management.  Mania: Denied. Trauma History: Denied. Visual/auditory hallucinations: Denied. Delusional thoughts: Denied.  Tobacco: Denied. Alcohol: She reported occasional alcohol consumption and denied a history of problematic alcohol abuse or dependence.  Recreational drugs: Denied.  Family History: Problem Relation Age of Onset   Bowel Disease Mother        ischemic bowel   Lung cancer Mother        smoked   Dementia  Mother        unspecified type   Cancer Maternal Grandfather        unknown type   Lung cancer Paternal Grandfather        smoked   Cervical cancer Paternal Aunt    Colon cancer Neg Hx    Breast cancer Neg Hx    Esophageal cancer Neg Hx    Pancreatic cancer Neg Hx    Prostate cancer Neg Hx    Rectal cancer Neg Hx    Stomach cancer Neg Hx    Allergic rhinitis Neg Hx    Asthma Neg Hx    Eczema Neg Hx    Urticaria Neg Hx    Colon polyps Neg Hx    This information was confirmed by Ms. Cygan.  Academic/Vocational History: Highest level of educational attainment: 18 years. She earned a Manufacturing engineer in adult education. She described herself as an average student in academic settings. No relative weaknesses were identified.  History of developmental delay: Denied. History of grade repetition: Denied. Enrollment in special education courses: Denied. History of LD/ADHD: Denied.  Employment: Retired. 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   Evaluation Results:   Behavioral Observations: Ms. Fein was unaccompanied, arrived to her appointment on time, and was appropriately dressed and groomed. She appeared alert. Observed gait and station were within normal limits. Gross motor functioning appeared intact upon informal observation and no abnormal movements (e.g., tremors) were noted. Her affect was generally relaxed and positive. Spontaneous speech was fluent and word finding difficulties were not observed during the clinical interview. Thought processes were coherent, organized, and normal in content. Insight into her cognitive difficulties appeared adequate.   During testing, sustained attention was appropriate. Task engagement was adequate and she persisted when challenged. Overall, Ms. Ziolkowski was cooperative with the clinical interview and subsequent testing procedures.    Adequacy of Effort: The validity of neuropsychological testing is limited by the extent to which the individual being tested may be assumed to have exerted adequate effort during testing. Ms. Reily expressed her intention to perform to the best of her abilities and exhibited adequate task engagement and persistence. Scores across stand-alone and embedded performance validity measures were within expectation. As such, the results of the current evaluation are believed to be a valid representation of Ms. Pingleton's current cognitive functioning.  Test Results: Ms. Espy was fully oriented at the time of the current evaluation.  Intellectual abilities based upon educational and vocational attainment were estimated to be in the average to above average range. Premorbid abilities were estimated to be within the above average range based upon a single-word reading test.   Processing speed was below average to average. Basic attention was below average. More complex attention (e.g., working memory) was average. Executive functioning was variable,  ranging from the well below average to well above average normative ranges.  While not directly assessed, receptive language abilities were believed to be intact. Ms. Kube did not exhibit any difficulties comprehending task instructions and answered all questions asked of her appropriately. Assessed expressive language was somewhat variable. Phonemic fluency was well below average to below average, semantic fluency was exceptionally low to well below average, and confrontation naming was above average.    Assessed visuospatial/visuoconstructional abilities were average to above average.    Learning (i.e., encoding) of novel verbal and visual information was below average to average. Spontaneous delayed recall (i.e., retrieval) of previously learned information was well below average across a shape learning task but average to above average across all  verbal tasks. Retention rates were 117% across a list learning task, 103% across a story learning task, 88% across a daily living task, and 38% across a shape learning task. Performance across recognition tasks was commensurate with performances across delayed retrieval tasks, suggesting some evidence for information consolidation.   Results of emotional screening instruments suggested that recent symptoms of generalized anxiety were in the mild range, while symptoms of depression were within normal limits. A screening instrument assessing recent sleep quality suggested the presence of minimal sleep dysfunction.  Table of Scores:   Note: This summary of test scores accompanies the interpretive report and should not be considered in isolation without reference to the appropriate sections in the text. Descriptors are based on appropriate normative data and may be adjusted based on clinical judgment. Terms such as Within Normal Limits and Outside Normal Limits are used when a more specific description of the test score cannot be determined.       Percentile - Normative Descriptor > 98 - Exceptionally High 91-97 - Well Above Average 75-90 - Above Average 25-74 - Average 9-24 - Below Average 2-8 - Well Below Average < 2 - Exceptionally Low       Validity:   DESCRIPTOR       DCT: --- --- Within Normal Limits       Orientation:      Raw Score Percentile   NAB Orientation, Form 1 29/29 --- ---       Cognitive Screening:      Raw Score Percentile   SLUMS: 23/30 --- ---       Intellectual Functioning:      Standard Score Percentile   Test of Premorbid Functioning: 110 75 Above Average       Memory:     NAB Memory Module, Form 1: Standard Score/ T Score Percentile   Total Memory Index 91 27 Average  List Learning       Total Trials 1-3 23/36 (48) 42 Average    Short Delay Free Recall 6/12 (42) 21 Below Average    Long Delay Free Recall 7/12 (47) 38 Average    Retention Percentage  117 (55) 69 Average    Recognition Discriminability 5 (48) 42 Average  Shape Learning       Total Trials 1-3 16/27 (52) 58 Average    Delayed Recall 3/9 (33) 5 Well Below Average    Retention Percentage 38 (32) 4 Well Below Average    Recognition Discriminability 3 (34) 5 Well Below Average  Story Learning       Immediate Recall 64/80 (49) 46 Average    Delayed Recall 37/40 (58) 79 Above Average    Retention Percentage 103 (57) 75 Above Average  Daily Living Memory  Immediate Recall 38/51 (40) 16 Below Average    Delayed Recall 14/17 (48) 42 Average    Retention Percentage 88 (51) 54 Average    Recognition Hits 9/10 (55) 69 Average       Attention/Executive Function:     Trail Making Test (TMT): Raw Score (T Score) Percentile     Part A 47 secs.,  0 errors (39) 14 Below Average    Part B 219 secs.,  3 errors (28) 2 Well Below Average         Scaled Score Percentile   WAIS-5 Coding: 8 25 Average  WAIS-5 Naming Speed Quantity: 10 50 Average        Scaled Score Percentile   WAIS-5 Digits Forwards: 7 16 Below Average  WAIS-5 Digit Sequencing: 8 25 Average        Scaled Score Percentile   WAIS-5 Similarities: 10 50 Average  WAIS-5 Matrix Reasoning: 14 91 Well Above Average  WAIS-5 Figure Weights: 9 37 Average       D-KEFS Verbal Fluency Test: Raw Score (Scaled Score) Percentile     Letter Total Correct 24 (7) 16 Below Average    Category Total Correct 20 (5) 5 Well Below Average    Category Switching Total Correct 7 (4) 2 Well Below Average    Category Switching Accuracy 5 (4) 2 Well Below Average      Total Set Loss Errors 0 (13) 84 Above Average      Total Repetition Errors 2 (11) 63 Average       Language:     Verbal Fluency Test: Raw Score (T Score) Percentile     Phonemic Fluency (FAS) 24 (31) 3 Well Below Average    Animal Fluency 11 (27) 1 Exceptionally Low        NAB Language Module, Form 1: T Score Percentile     Naming 31/31 (60) 84 Above Average        Visuospatial/Visuoconstruction:      Raw Score Percentile   Clock Drawing: 10/10 --- Within Normal Limits       NAB Spatial Module, Form 1: T Score Percentile     Figure Drawing Copy 57 75 Above Average        Scaled Score Percentile   WAIS-5 Block Design: 8 25 Average       Mood and Personality:      Raw Score Percentile   Geriatric Depression Scale: 8 --- Within Normal Limits  Geriatric Anxiety Scale: 16 --- Mild    Somatic 5 --- Minimal    Cognitive 5 --- Mild    Affective 6 --- Mild       Additional Questionnaires:      Raw Score Percentile   PROMIS Sleep Disturbance Questionnaire: 9 --- None to Slight   Informed Consent and Coding/Compliance:   The current evaluation represents a clinical evaluation for the purposes previously outlined by the referral source and is in no way reflective of a forensic evaluation.   Ms. Menta was provided with a verbal description of the nature and purpose of the present neuropsychological evaluation. Also reviewed were the foreseeable risks and/or discomforts and benefits of the procedure, limits of confidentiality, and mandatory reporting requirements of this provider. The patient was given the opportunity to ask questions and receive answers about the evaluation. Oral consent to participate was provided by the patient.   This evaluation was conducted by Arthea KYM Maryland, Ph.D., ABPP-CN, board certified clinical neuropsychologist. Ms. Holthaus completed a clinical interview  with Dr. Richie, billed as one unit [phone], and 135 minutes of cognitive testing and scoring, billed as one unit [phone] and three additional units 96139. Psychometrist Luke Pitcher, B.S. assisted Dr. Richie with test administration and scoring procedures. As a separate and discrete service, one unit [phone] and two units 96133 (160 minutes) were billed for Dr. Loralee time spent in interpretation and report writing.

## 2024-05-03 NOTE — Progress Notes (Signed)
   Psychometrician Note   Cognitive testing was administered to Judy Morrison by Luke Pitcher, B.S. (psychometrist) under the supervision of Dr. Arthea KYM Maryland, Ph.D., ABPP, licensed psychologist on 05/03/2024. Judy Morrison did not appear overtly distressed by the testing session per behavioral observation or responses across self-report questionnaires. Rest breaks were offered.    The battery of tests administered was selected by Dr. Zachary C. Merz, Ph.D., ABPP with consideration to Judy Morrison's current level of functioning, the nature of her symptoms, emotional and behavioral responses during interview, level of literacy, observed level of motivation/effort, and the nature of the referral question. This battery was communicated to the psychometrist. Communication between Dr. Arthea KYM Maryland, Ph.D., ABPP and the psychometrist was ongoing throughout the evaluation and Dr. Arthea KYM Maryland, Ph.D., ABPP was immediately accessible at all times. Dr. Zachary C. Merz, Ph.D., ABPP provided supervision to the psychometrist on the date of this service to the extent necessary to assure the quality of all services provided.    Judy Morrison will return within approximately 1-2 weeks for an interactive feedback session with Dr. Maryland at which time her test performances, clinical impressions, and treatment recommendations will be reviewed in detail. Judy Morrison understands she can contact our office should she require our assistance before this time.  A total of 135 minutes of billable time were spent face-to-face with Judy Morrison by the psychometrist. This includes both test administration and scoring time. Billing for these services is reflected in the clinical report generated by Dr. Arthea KYM Maryland, Ph.D., ABPP  This note reflects time spent with the psychometrician and does not include test scores or any clinical interpretations made by Dr. Maryland. The full report will follow in a separate  note.

## 2024-05-09 ENCOUNTER — Ambulatory Visit (INDEPENDENT_AMBULATORY_CARE_PROVIDER_SITE_OTHER): Admitting: Psychology

## 2024-05-09 DIAGNOSIS — F0670 Mild neurocognitive disorder due to known physiological condition without behavioral disturbance: Secondary | ICD-10-CM | POA: Diagnosis not present

## 2024-05-09 NOTE — Progress Notes (Signed)
   Neuropsychology Feedback Session [name]. [facility] [place] Department of Neurology  Reason for Referral:   Judy Morrison is a 73 y.o. right-handed Caucasian female referred by Camie Sevin, PA-C, to characterize her current cognitive functioning and assist with diagnostic clarity and treatment planning in the context of subjective cognitive decline.   Feedback:   Judy Morrison completed a comprehensive neuropsychological evaluation on 05/03/2024. Please refer to that encounter for the full report and recommendations. Briefly, results suggested isolated impairments surrounding verbal fluency (both phonemic and semantic), as well as both delayed retrieval and recognition/consolidation aspects of visual memory. Further performance variability was exhibited across executive functioning. The cause for cognitive dysfunction is likely multifactorial in nature. Most recent neuroimaging (2024) has suggested moderate microvascular ischemic disease which has progressed over time since her initial brain MRI performed in 2019. Judy Morrison is also prescribed several medications with well established cognitive side effects, namely alprazolam/Xanax and clonazepam /Klonopin . Mild cognitive side effects have also been associated with anastrozole /Arimidex . Further, Judy Morrison described a primary decline in cognitive functioning following chemotherapy treatment. Cognitive side effects from chemotherapy treatment, sometimes referred to as chemobrain, are quite common in individuals going through active treatment. Persisting difficulties beyond treatment are also common, with some research suggesting 35% of individuals experience mild to moderate dysfunction even years following treatment completion. All of these factors, especially when combined, could reasonably create a pattern of deficits which Judy Morrison exhibited across the current evaluation. As such, this combination appears to be the most  likely cause of both subjective and objective dysfunction at the present time. Specific to memory, despite trouble recalling visual information, Judy Morrison was able to learn novel verbal information efficiently and retain this knowledge after lengthy delays. Retention rates across verbal memory tasks ranged from 88% to 117% for example. Overall, especially in light of other confounding variables described above, memory performances do not offer compelling evidence to warrant advanced concerns surrounding presently symptomatic Alzheimer's disease.  Judy Morrison was unaccompanied during the current telephone call. she was within her residence while I was within my office. I discussed the limitations of evaluation and management by telemedicine and the availability of in person appointments. Judy Morrison expressed her understanding and agreed to proceed. Content of the current session focused on the results of her neuropsychological evaluation. Judy Morrison was given the opportunity to ask questions and her questions were answered. she was encouraged to reach out should additional questions arise. A copy of her report was provided/mailed at the conclusion of the visit.      One unit 96132 (40 minutes) was billed for Dr. Loralee time spent preparing for, conducting, and documenting the current feedback session with Judy Morrison.

## 2024-05-11 ENCOUNTER — Ambulatory Visit

## 2024-05-11 ENCOUNTER — Ambulatory Visit: Admitting: Physician Assistant

## 2024-05-16 NOTE — Progress Notes (Signed)
 ADVANCED HEART FAILURE CLINIC NOTE  Referring Physician: Medicine, [facility]*  Primary Care: Pyrtle, Gordy HERO, MD Oncologist: Dr. Loretha  HPI: Judy Morrison is a 73 y.o. female with breast cancer presenting today to for follow up.  Her cancer history dates back to November 2023 when screening mammogram demonstrated right breast irregularity.  The following months she had a pathology confirmed grade 2 invasive mammary carcinoma.  She underwent right breast lumpectomy by Dr. 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  She was started on chemotherapy in November 08, 2022 with paclitaxel , trastuzumab .  Interval hx:  - Today during her appointment we had her daughter on the phone. According to both of them, she has exertional dyspnea. Reports getting short of breath when walking up the driveway  and when bending over. TTE on 01/27/24 unremarkable with improvement in LVEF.   - Daughter reports significant cognitive delays now; feels that she is slowing down.   Current Outpatient Medications  Medication Sig Dispense Refill   Adapalene 0.3 % gel Apply 1 application daily as needed topically (for acne).     ALPRAZolam (XANAX) 0.25 MG tablet Take 0.25 mg by mouth daily as needed for anxiety.   1   amphetamine-dextroamphetamine (ADDERALL) 10 MG tablet Take 10 mg by mouth daily with breakfast.     anastrozole  (ARIMIDEX ) 1 MG tablet TAKE 1 TABLET BY MOUTH EVERY DAY 90 tablet 3   brexpiprazole  (REXULTI ) 2 MG TABS tablet Take 2 mg by mouth daily after breakfast.     budesonide -formoterol  (SYMBICORT ) 80-4.5 MCG/ACT inhaler Inhale 2 puffs into the lungs in the morning and at bedtime. 1 each 12   carvedilol  (COREG ) 3.125 MG tablet TAKE 1 TABLET BY MOUTH 2 TIMES DAILY. 180 tablet 1   clobetasol cream (TEMOVATE) 0.05 % Apply 1 Application topically 2 (two) times daily as needed (irritation).     clonazePAM   (KLONOPIN ) 0.5 MG tablet Take 0.5 mg by mouth 2 (two) times daily.     clotrimazole (MYCELEX) 10 MG troche Take 10 mg by mouth as directed. 2 times per week     DULoxetine  (CYMBALTA ) 20 MG capsule Take 20 mg by mouth daily.     ferrous sulfate  325 (65 FE) MG tablet Take 325 mg by mouth every other day.     furosemide  (LASIX ) 20 MG tablet TAKE 1 TABLET BY MOUTH AS NEEDED 30 tablet 3   hydrOXYzine (VISTARIL) 25 MG capsule Take 25-50 mg by mouth daily.     latanoprost  (XALATAN ) 0.005 % ophthalmic solution Place 1 drop into both eyes at bedtime.     levothyroxine  (SYNTHROID , LEVOTHROID) 50 MCG tablet Take 50 mcg at bedtime by mouth.      loratadine  (CLARITIN ) 10 MG tablet Take 10 mg by mouth daily.     losartan  (COZAAR ) 25 MG tablet Take 1 tablet (25 mg total) by mouth at bedtime.     meloxicam (MOBIC) 7.5 MG tablet Take 7.5 mg by mouth daily.     nortriptyline  (PAMELOR ) 50 MG capsule Take 100 mg at bedtime by mouth.      omeprazole  (PRILOSEC) 40 MG capsule Take 1 capsule (40 mg total) by mouth daily before breakfast. 90 capsule 3   OVER THE COUNTER MEDICATION 20 mLs by Mouth Rinse route 4 (four) times daily as needed (mouth pain). Oral-B Sore Mouth Rinse     phenazopyridine (PYRIDIUM) 200 MG tablet Take 200 mg by mouth 3 (  three) times daily as needed.     simvastatin (ZOCOR) 10 MG tablet Take 10 mg by mouth daily.     tacrolimus (PROGRAF) 1 MG capsule Take 1 mg by mouth 2 (two) times daily. Per patient - Dissolve capsule in one half liter of H2O. Swish and spit twice daily     triamcinolone cream (KENALOG) 0.1 % Apply 1 Application topically 2 (two) times daily as needed (irritation).     valACYclovir (VALTREX) 1000 MG tablet Take 1,000 mg by mouth daily as needed (outbreaks).     No current facility-administered medications for this visit.   Facility-Administered Medications Ordered in Other Visits  Medication Dose Route Frequency Provider Last Rate Last Admin   sodium chloride  flush (NS) 0.9 %  injection 10 mL  10 mL Intracatheter PRN Iruku, Praveena, MD         PHYSICAL EXAM: There were no vitals filed for this visit. GENERAL: NAD Lungs- *** CARDIAC:  JVP: *** cm          Normal rate with regular rhythm. *** murmur.  Pulses ***. *** edema.  ABDOMEN: Soft, non-tender, non-distended.  EXTREMITIES: Warm and well perfused.  NEUROLOGIC: No obvious FND   DATA REVIEW  ECG: 10/02/21: NSR, LVH  As per my personal interpretation  ECHO: 08/19/23: LVEF 55%-60%, grade I DD 02/14/23: LVEF 50-55, GLS -13%, normal RV function as per my personal interpretation 10/18/22: LVEF 55%, GLS -15% normal RV function, mild AI 04/05/23: LVEF 65%, normal RV function, mild AI, GLS -19.9%.    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  GLS has suboptimal tracing and is mostly unchanged.   -TTE  on 08/19/2023 with LVEF of 60% and grade 1 diastolic dysfunction. -repeat TTE today (11/29/23) with reduction in LVEF to 45%. This can be secondary to herceptin  and/or underlying LBBB. If it is secondary to herceptin , I suspect LVEF will recover over the next few months with GDMT. - TTE on 5/25 with LVEF of 60-65%.  -continue coreg  3.125mg  BID -continue losartan  50mg  daily  - Reviewed labs from 01/27/2024: BNP 103, serum creatinine 0.84  2.  Invasive ductal carcinoma -Following with Dr. Loretha -Completed Herceptin  on 11/01/23 - TTE with LVEF of 60 to 65%.  3. Iron deficiency  - Feels fatigued; ferritin of 8, iron 53, TIBC 515 on 11/01/23 - Will discuss iron infusion with Dr. Loretha  4. Dyspnea - Continue using albuterol inhaler.  Followed by pulmonology.  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 Memory loss / confusion  - Spoke with daughter  extensively on the phone according to her, Judy Morrison has had some degree of cognitive decline over the past several months.  We went through her medications in depth.  She is on multiple medications that could lead to this including Klonopin , Vistaril, Xanax and amitriptyline.  She is currently using Vistaril quite frequently and uses Klonopin .  We discussed possibly trying to wean down the amount that she is using.  They will discuss with their PCP  I spent 60 minutes caring for this patient today including face to face time, ordering and reviewing labs, reviewing records noted above, discussing memory loss, reviewing echos, seeing the patient, documenting in the record, and arranging follow ups.   Nikolaj Geraghty Advanced Heart Failure  Mechanical Circulatory Support

## 2024-05-17 ENCOUNTER — Ambulatory Visit (HOSPITAL_COMMUNITY)
Admission: RE | Admit: 2024-05-17 | Discharge: 2024-05-17 | Disposition: A | Discharge status: Home / Self Care | Source: Ambulatory Visit | Attending: Cardiology | Admitting: Cardiology

## 2024-05-17 ENCOUNTER — Encounter (HOSPITAL_COMMUNITY): Payer: Self-pay | Admitting: Cardiology

## 2024-05-17 VITALS — BP 120/70 | HR 89 | Wt 168.6 lb

## 2024-05-17 DIAGNOSIS — Z09 Encounter for follow-up examination after completed treatment for conditions other than malignant neoplasm: Secondary | ICD-10-CM | POA: Insufficient documentation

## 2024-05-17 DIAGNOSIS — J449 Chronic obstructive pulmonary disease, unspecified: Secondary | ICD-10-CM | POA: Diagnosis not present

## 2024-05-17 DIAGNOSIS — E611 Iron deficiency: Secondary | ICD-10-CM | POA: Insufficient documentation

## 2024-05-17 DIAGNOSIS — R410 Disorientation, unspecified: Secondary | ICD-10-CM | POA: Insufficient documentation

## 2024-05-17 DIAGNOSIS — Z79899 Other long term (current) drug therapy: Secondary | ICD-10-CM | POA: Diagnosis not present

## 2024-05-17 DIAGNOSIS — I10 Essential (primary) hypertension: Secondary | ICD-10-CM | POA: Diagnosis not present

## 2024-05-17 DIAGNOSIS — R4189 Other symptoms and signs involving cognitive functions and awareness: Secondary | ICD-10-CM | POA: Insufficient documentation

## 2024-05-17 DIAGNOSIS — Z853 Personal history of malignant neoplasm of breast: Secondary | ICD-10-CM | POA: Diagnosis not present

## 2024-05-17 DIAGNOSIS — R0600 Dyspnea, unspecified: Secondary | ICD-10-CM | POA: Insufficient documentation

## 2024-05-17 DIAGNOSIS — Z7951 Long term (current) use of inhaled steroids: Secondary | ICD-10-CM | POA: Insufficient documentation

## 2024-05-17 DIAGNOSIS — R413 Other amnesia: Secondary | ICD-10-CM | POA: Diagnosis not present

## 2024-05-17 DIAGNOSIS — Z9221 Personal history of antineoplastic chemotherapy: Secondary | ICD-10-CM

## 2024-05-17 NOTE — Patient Instructions (Signed)
 Medication Changes:  STOP CARVEDILOL    Follow-Up in: AS NEEDED   At the [facility], you and your health needs are our priority. We have a designated team specialized in the treatment of Heart Failure. This Care Team includes your primary Heart Failure Specialized Cardiologist (physician), Advanced Practice Providers (APPs- Physician Assistants and Nurse Practitioners), and Pharmacist who all work together to provide you with the care you need, when you need it.   You may see any of the following providers on your designated Care Team at your next follow up:  Dr. Toribio Fuel Dr. Ezra Shuck Dr. Ria Commander Dr. Odis Brownie Greig Mosses, NP Caffie Shed, [place] [facility] [place], [place] Beckey Coe, NP [place] Lee, NP Tinnie Redman, PharmD   Please be sure to bring in all your medications bottles to every appointment.   Need to Contact Us :  If you have any questions or concerns before your next appointment please send us  a message through [place] or call our office at [phone].    TO LEAVE A MESSAGE FOR THE NURSE SELECT OPTION 2, PLEASE LEAVE A MESSAGE INCLUDING: YOUR NAME DATE OF BIRTH CALL BACK NUMBER REASON FOR CALL**this is important as we prioritize the call backs  YOU WILL RECEIVE A CALL BACK THE SAME DAY AS LONG AS YOU CALL BEFORE 4:00 PM

## 2024-05-22 ENCOUNTER — Other Ambulatory Visit (HOSPITAL_COMMUNITY): Payer: Self-pay | Admitting: Cardiology

## 2024-05-27 ENCOUNTER — Other Ambulatory Visit (HOSPITAL_COMMUNITY): Payer: Self-pay | Admitting: Cardiology

## 2024-05-28 ENCOUNTER — Telehealth (HOSPITAL_COMMUNITY): Payer: Self-pay | Admitting: *Deleted

## 2024-05-28 DIAGNOSIS — I5032 Chronic diastolic (congestive) heart failure: Secondary | ICD-10-CM

## 2024-05-28 MED ORDER — LOSARTAN POTASSIUM 25 MG PO TABS: 25.0000 mg | ORAL_TABLET | Freq: Every evening | ORAL | 3 refills | Status: AC

## 2024-05-28 NOTE — Telephone Encounter (Signed)
 Received request from local pharmacy for refill on Losartan  50 mg daily. Called patient to confirm dose. She is currently and has been taking 25 mg daily. Refill for same sent.

## 2024-06-06 ENCOUNTER — Other Ambulatory Visit (HOSPITAL_COMMUNITY): Payer: Self-pay | Admitting: Cardiology

## 2024-06-11 ENCOUNTER — Encounter (INDEPENDENT_AMBULATORY_CARE_PROVIDER_SITE_OTHER): Payer: Self-pay | Admitting: Otolaryngology

## 2024-06-11 ENCOUNTER — Ambulatory Visit (INDEPENDENT_AMBULATORY_CARE_PROVIDER_SITE_OTHER): Admitting: Otolaryngology

## 2024-06-11 VITALS — BP 115/75 | HR 59 | Temp 97.8°F | Ht 63.0 in | Wt 162.0 lb

## 2024-06-11 DIAGNOSIS — Z8719 Personal history of other diseases of the digestive system: Secondary | ICD-10-CM

## 2024-06-11 DIAGNOSIS — H9202 Otalgia, left ear: Secondary | ICD-10-CM | POA: Diagnosis not present

## 2024-06-11 MED ORDER — PREDNISONE 10 MG (21) PO TBPK: ORAL_TABLET | ORAL | 0 refills | Status: AC

## 2024-06-12 DIAGNOSIS — H9202 Otalgia, left ear: Secondary | ICD-10-CM | POA: Insufficient documentation

## 2024-06-12 DIAGNOSIS — Z8719 Personal history of other diseases of the digestive system: Secondary | ICD-10-CM | POA: Insufficient documentation

## 2024-06-12 NOTE — Progress Notes (Signed)
 Patient ID: Judy Morrison, female   DOB: [birth year], 73 y.o.   MRN: [id number]  CC: Oral ulcers, left ear pain  HPI: The patient is a 73 year old female who presents today complaining of oral ulcers for the past 2 weeks.  The patient has a history of oral lichenoid mucositis.  According to the patient, she has been experiencing recurrent oral ulcers over the past 2 weeks, while she was traveling in [place].  She has also noted intermittent left ear pain.  She denies any otorrhea, hearing loss, or tinnitus.  Exam: General: Communicates without difficulty, well nourished, no acute distress. Head: Normocephalic, no evidence injury, no tenderness, facial buttresses intact without stepoff. Face/sinus: No tenderness to palpation and percussion. Facial movement is normal and symmetric. Eyes: PERRL, EOMI. No scleral icterus, conjunctivae clear. Neuro: CN II exam reveals vision grossly intact.  No nystagmus at any point of gaze. Ears: Auricles well formed without lesions.  Ear canals are intact without mass or lesion.  No erythema or edema is appreciated.  The TMs are intact without fluid. Nose: External evaluation reveals normal support and skin without lesions.  Dorsum is intact.  Anterior rhinoscopy reveals congested mucosa over anterior aspect of inferior turbinates and intact septum.  No purulence noted. Oral: An ulcer is noted on the left lateral tongue.  Neck: Full range of motion without pain.  There is no significant lymphadenopathy.  No masses palpable.  Thyroid  bed within normal limits to palpation.  Parotid glands and submandibular glands equal bilaterally without mass.  Trachea is midline. Neuro:  CN 2-12 grossly intact.   Assessment: 1.  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 2.  Referred left otalgia.  Her ear canals, tympanic membranes, and middle ear spaces are normal.  Plan: 1.  The physical exam findings are reviewed with the patient. 2.  Prednisone Dosepak  for 6 days. 3.  The patient is reassured that no otologic abnormality is noted today. 4.  The patient will return for reevaluation in 2 weeks.

## 2024-06-19 ENCOUNTER — Telehealth (INDEPENDENT_AMBULATORY_CARE_PROVIDER_SITE_OTHER): Payer: Self-pay | Admitting: Otolaryngology

## 2024-06-19 NOTE — Telephone Encounter (Signed)
 The patient came in the office today.  She wanted to let Dr Karis know that she did take the pred medication and there was no difference with her ears or the sores in her mouth.  She says she has had a dental appointment with her dentist - Dr Pasco- who referred her to an oral surgeon for these mouth sores.   She intends to follow up with the oral surgeon for her mouth sores.  She stated her ears have not changed at all since her appointment here with Dr Karis.  She is asking if she should keep her follow up appointment to see Dr Karis later this month, she will either keep it or cancel it dependent on Dr Rojean advice.

## 2024-06-20 ENCOUNTER — Telehealth (INDEPENDENT_AMBULATORY_CARE_PROVIDER_SITE_OTHER): Payer: Self-pay | Admitting: Otolaryngology

## 2024-06-20 NOTE — Telephone Encounter (Signed)
 Returned patient's phone call regarding her ear ear and mouth sore. She is stated that she has seen an oral surgeon yesterday ( Dr. Joanette) and she was given Lidocaine  mouth wash. Regarding to her ears, the next step is that Dr. Karis can sent a referral to either [facility] or [facility]. She said she will let us  know in a few weeks.

## 2024-06-22 ENCOUNTER — Other Ambulatory Visit: Payer: Self-pay

## 2024-06-22 DIAGNOSIS — Z1379 Encounter for other screening for genetic and chromosomal anomalies: Secondary | ICD-10-CM

## 2024-07-03 ENCOUNTER — Ambulatory Visit (INDEPENDENT_AMBULATORY_CARE_PROVIDER_SITE_OTHER): Admitting: Otolaryngology

## 2024-07-09 ENCOUNTER — Ambulatory Visit: Attending: General Surgery

## 2024-07-09 DIAGNOSIS — Z483 Aftercare following surgery for neoplasm: Secondary | ICD-10-CM | POA: Insufficient documentation

## 2024-08-06 ENCOUNTER — Ambulatory Visit: Attending: General Surgery

## 2024-08-06 VITALS — Wt 163.4 lb

## 2024-08-06 DIAGNOSIS — Z483 Aftercare following surgery for neoplasm: Secondary | ICD-10-CM | POA: Insufficient documentation
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
## 2024-08-06 NOTE — Therapy (Signed)
 OUTPATIENT PHYSICAL THERAPY SOZO SCREENING NOTE   Patient Name: Judy Morrison MRN: [id number] DOB:Apr 18, 1951, 73 y.o., female Today's Date: 08/06/2024  PCP: Albertus Gordy HERO, MD REFERRING PROVIDER: Ebbie Cough, MD   PT End of Session - 08/06/24 1027     Visit Number 3   # unchanged due to screen only   PT Start Time 1025    PT Stop Time 1029    PT Time Calculation (min) 4 min    Activity Tolerance Patient tolerated treatment well    Behavior During Therapy [facility] for tasks assessed/performed          Past Medical History:  Diagnosis Date   Acquired hallux rigidus of right foot 01/25/2022   Acute on chronic respiratory failure (HCC) 12/06/2023   Allergic rhinitis    Asthma 01/12/2024   Atypical chest pain 05/10/2018   Chronic systolic heart failure 12/06/2023   Congestive heart failure 12/06/2023   Dermatitis    rare form, on prednisone    Dizziness 05/10/2018   Dyspnea    with exertion mostly   Generalized anxiety disorder 05/21/2011   Under the care of psychiatry. Takes Pamelor , Klonopin , Cymbalta , and Rexulti .     Genetic testing 10/04/2022   [facility] CancerNext-Expanded Panel+RNA was Positive. 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 Report date is 10/04/2022.     The CancerNext-Expanded gene panel offered by [employer] and includes sequencing, rearrangement, and RNA analysis for the following 77 genes: AIP, ALK, APC, ATM, AXIN2   GERD (gastroesophageal reflux disease)    controlled with Omeprazole    Hypothyroidism    Incomplete tear of right rotator cuff 05/19/2017   Overview:   saw [place] orthopedist 2016, had MRI. Treating with PT     LBBB (left bundle branch block) 12/06/2023   Major depressive disorder    Malignant neoplasm of upper-outer quadrant of right breast in female, estrogen receptor positive 09/10/2022   Melanocytic nevus of trunk 05/03/2024    Microcytic anemia 12/06/2023   Mild neurocognitive disorder due to multiple etiologies 05/03/2024   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   Osteoarthritis of left knee 01/06/2018   Osteopenia 05/19/2017   Pain in joint of right shoulder 05/29/2021   Pain in left foot 06/13/2023   Pain of right hip joint 01/18/2023   Peripheral neuropathy due to chemotherapy    Personal history of chemotherapy    12 weeks   Personal history of radiation therapy    20 consecutive days   PNA (pneumonia) 12/06/2023   Port-A-Cath in place 11/15/2022   Primary hypertension 12/06/2023   Primary open angle glaucoma (POAG) 12/30/2016   Overview:   Under the care of Dr. Patrcia     Primary osteoarthritis of both knees 12/30/2016   Overview:   Under the care of [facility] orthopedics. Has been getting Flexogenic and cortisone Shots.     Pruritus 08/14/2020   Recurrent oral herpes simplex infection 12/30/2016   Right foot pain 12/19/2014   Overview:   Seeing Orthopedist in May 2016. Has seen Dr. Rinda and was put in a boot for bunion on this foot. Then saw PT who made an orthotic insert that helps.     S/P knee replacement 08/12/2017   Sensorineural hearing loss, bilateral 10/11/2023   Tubular adenoma of colon 12/30/2016   Overview:   On colonoscopy 2017. One year repeat recommended.    On colonoscopy 2017. Repeated 2018  and was normal. Three-year recall recommended.     Urge incontinence of urine 05/19/2017   Urinary tract infection    dx 05-26-17 took 1 week cipro  BID completed 06-02-17.   Verruca vulgaris 05/03/2024   Past Surgical History:  Procedure Laterality Date   ADENOIDECTOMY     APPENDECTOMY     arthroscopic right knee      BREAST BIOPSY Right 09/01/2022   MM RT BREAST BX W LOC DEV 1ST LESION IMAGE BX SPEC STEREO GUIDE 09/01/2022 [facility]   BREAST BIOPSY  10/01/2022   MM RT RADIOACTIVE SEED LOC MAMMO GUIDE 10/01/2022 [facility]   BREAST LUMPECTOMY     BREAST  LUMPECTOMY WITH RADIOACTIVE SEED AND SENTINEL LYMPH NODE BIOPSY Right 10/05/2022   Procedure: RIGHT BREAST LUMPECTOMY WITH RADIOACTIVE SEED AND AXILLARY SENTINEL LYMPH NODE BIOPSY;  Surgeon: Ebbie Cough, MD;  Location: [facility];  Service: General;  Laterality: Right;   CHOLECYSTECTOMY     COLONOSCOPY  06/06/2017   Pyrtle   Pelvic sling     x2   POLYPECTOMY     PORT A CATH REVISION N/A 11/22/2022   Procedure: PORT A CATH REVISION;  Surgeon: Ebbie Cough, MD;  Location: [facility] OR;  Service: General;  Laterality: N/A;   PORT-A-CATH REMOVAL Left 11/28/2023   Procedure: REMOVAL PORT-A-CATH;  Surgeon: Ebbie Cough, MD;  Location: [facility];  Service: General;  Laterality: Left;   PORTACATH PLACEMENT Left 10/05/2022   Procedure: INSERTION PORT-A-CATH;  Surgeon: Ebbie Cough, MD;  Location:  SUR[facility]ERY CENTER;  Service: General;  Laterality: Left;   ROTATOR CUFF REPAIR Right    TONSILLECTOMY     TOTAL KNEE ARTHROPLASTY Right 08/12/2017   Procedure: RIGHT TOTAL KNEE ARTHROPLASTY;  Surgeon: Gerome Charleston, MD;  Location: [facility];  Service: Orthopedics;  Laterality: Right;   TOTAL KNEE ARTHROPLASTY Left 01/06/2018   Procedure: LEFT TOTAL KNEE ARTHROPLASTY;  Surgeon: Gerome Charleston, MD;  Location: [facility];  Service: Orthopedics;  Laterality: Left;   UPPER GASTROINTESTINAL ENDOSCOPY     WISDOM TOOTH EXTRACTION     Patient Active Problem List   Diagnosis Date Noted   H/O oral aphthous ulcers 06/12/2024   Referred otalgia of left ear 06/12/2024   Mild neurocognitive disorder due to multiple etiologies 05/03/2024   Major depressive disorder    Abnormal CT of the chest 04/19/2024   Allergic rhinitis    Asthma 01/12/2024   Chronic systolic heart failure 12/06/2023   Congestive heart failure 12/06/2023   LBBB (left bundle branch block) 12/06/2023   Microcytic anemia 12/06/2023   Primary hypertension 12/06/2023   Sensorineural hearing loss,  bilateral 10/11/2023   Genetic testing 10/04/2022   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   Malignant neoplasm of upper-outer quadrant of right breast in female, estrogen receptor positive 09/10/2022   Acquired hallux rigidus of right foot 01/25/2022   Pruritus 08/14/2020   S/P knee replacement 08/12/2017   Osteopenia 05/19/2017   Urge incontinence of urine 05/19/2017   Primary open angle glaucoma (POAG) 12/30/2016   Primary osteoarthritis of both knees 12/30/2016   Hypothyroidism 06/25/2016   Generalized anxiety disorder 05/21/2011    REFERRING DIAG: right breast cancer at risk for lymphedema  THERAPY DIAG: Aftercare following surgery for neoplasm  PERTINENT HISTORY: Patient was diagnosed on 08/02/2022 with right grade 2 invasive ductal carcinoma breast cancer. It measures 1.5 cm and is located in the upper outer quadrant. It is triple positive with a Ki67 of 5%. She had  a rotator cuff repair in her right shoulder 10/01/2021 which continues to be problematic. She had a right lumpectomy with SLNB on 10/05/2022   PRECAUTIONS: right UE Lymphedema risk, None  SUBJECTIVE: Pt returns for her 3 month L-Dex screen.   PAIN:  Are you having pain? No  SOZO SCREENING: Patient was assessed today using the SOZO machine to determine the lymphedema index score. This was compared to her baseline score. It was determined that she is within the recommended range when compared to her baseline and no further action is needed at this time. She will continue SOZO screenings. These are done every 3 months for 2 years post operatively followed by every 6 months for 2 years, and then annually.   L-DEX FLOWSHEETS - 08/06/24 1000       L-DEX LYMPHEDEMA SCREENING   Measurement Type Unilateral    L-DEX MEASUREMENT EXTREMITY Upper Extremity    POSITION  Standing    DOMINANT SIDE Right    At Risk Side Right    BASELINE SCORE (UNILATERAL) -3.4    L-DEX SCORE (UNILATERAL) -4.7    VALUE CHANGE (UNILAT)  -1.3         P: Pt has one more 3 month L-Dex screen then can transition to 6 months until 09/2026.  Judy Morrison, PTA 08/06/2024, 10:28 AM

## 2024-08-08 ENCOUNTER — Ambulatory Visit: Admitting: Physician Assistant

## 2024-08-08 ENCOUNTER — Encounter: Payer: Self-pay | Admitting: Physician Assistant

## 2024-08-08 VITALS — Resp 20 | Ht 63.0 in

## 2024-08-08 DIAGNOSIS — F0670 Mild neurocognitive disorder due to known physiological condition without behavioral disturbance: Secondary | ICD-10-CM

## 2024-08-21 ENCOUNTER — Encounter: Payer: Self-pay | Admitting: Adult Health

## 2024-08-31 ENCOUNTER — Inpatient Hospital Stay: Admission: RE | Admit: 2024-08-31 | Discharge: 2024-08-31 | Attending: Hematology and Oncology

## 2024-08-31 DIAGNOSIS — Z170 Estrogen receptor positive status [ER+]: Secondary | ICD-10-CM

## 2024-08-31 DIAGNOSIS — Z1501 Genetic susceptibility to malignant neoplasm of breast: Secondary | ICD-10-CM

## 2024-10-16 NOTE — Progress Notes (Signed)
 Nutrition  Received message from patient interested in weight management.    Called patient back and left message explaining that Nutrition team at [facility] does not provide weight management counseling.  Recommend patient have PCP place referral to [facility] and Diabetes Education [facility] [phone]) for weight management and outpatient RD would be able to assist.    Judy Morrison B. Dasie SOLON, CSO, LDN Registered Dietitian [phone]

## 2024-11-02 ENCOUNTER — Ambulatory Visit: Admitting: Hematology and Oncology

## 2024-11-05 ENCOUNTER — Ambulatory Visit: Attending: General Surgery

## 2025-02-06 ENCOUNTER — Ambulatory Visit: Admitting: Physician Assistant
